# Patient Record
Sex: Male | Born: 1935 | Race: White | Hispanic: No | Marital: Married | Smoking: Never smoker
Health system: Southern US, Community
[De-identification: ages and names within clinical notes are randomized; demographics above are authoritative.]

## PROBLEM LIST (undated history)

## (undated) DIAGNOSIS — N183 Chronic kidney disease, stage 3 unspecified: Secondary | ICD-10-CM

## (undated) DIAGNOSIS — I480 Paroxysmal atrial fibrillation: Secondary | ICD-10-CM

## (undated) DIAGNOSIS — I255 Ischemic cardiomyopathy: Secondary | ICD-10-CM

## (undated) DIAGNOSIS — I219 Acute myocardial infarction, unspecified: Secondary | ICD-10-CM

## (undated) DIAGNOSIS — J61 Pneumoconiosis due to asbestos and other mineral fibers: Secondary | ICD-10-CM

## (undated) DIAGNOSIS — S72009A Fracture of unspecified part of neck of unspecified femur, initial encounter for closed fracture: Secondary | ICD-10-CM

## (undated) DIAGNOSIS — I2510 Atherosclerotic heart disease of native coronary artery without angina pectoris: Secondary | ICD-10-CM

## (undated) DIAGNOSIS — D649 Anemia, unspecified: Secondary | ICD-10-CM

## (undated) DIAGNOSIS — M1990 Unspecified osteoarthritis, unspecified site: Secondary | ICD-10-CM

## (undated) DIAGNOSIS — M47816 Spondylosis without myelopathy or radiculopathy, lumbar region: Secondary | ICD-10-CM

## (undated) DIAGNOSIS — R0600 Dyspnea, unspecified: Secondary | ICD-10-CM

## (undated) DIAGNOSIS — I951 Orthostatic hypotension: Secondary | ICD-10-CM

## (undated) DIAGNOSIS — E871 Hypo-osmolality and hyponatremia: Secondary | ICD-10-CM

## (undated) DIAGNOSIS — I351 Nonrheumatic aortic (valve) insufficiency: Secondary | ICD-10-CM

## (undated) DIAGNOSIS — I440 Atrioventricular block, first degree: Secondary | ICD-10-CM

## (undated) DIAGNOSIS — I313 Pericardial effusion (noninflammatory): Secondary | ICD-10-CM

## (undated) DIAGNOSIS — N320 Bladder-neck obstruction: Secondary | ICD-10-CM

## (undated) DIAGNOSIS — I3139 Other pericardial effusion (noninflammatory): Secondary | ICD-10-CM

## (undated) DIAGNOSIS — I5042 Chronic combined systolic (congestive) and diastolic (congestive) heart failure: Secondary | ICD-10-CM

## (undated) DIAGNOSIS — E785 Hyperlipidemia, unspecified: Secondary | ICD-10-CM

## (undated) HISTORY — PX: OTHER SURGICAL HISTORY: SHX169

---

## 2000-08-18 ENCOUNTER — Ambulatory Visit (HOSPITAL_COMMUNITY): Admission: RE | Admit: 2000-08-18 | Discharge: 2000-08-18 | Payer: Self-pay | Admitting: Gastroenterology

## 2000-08-18 ENCOUNTER — Encounter (INDEPENDENT_AMBULATORY_CARE_PROVIDER_SITE_OTHER): Payer: Self-pay | Admitting: Specialist

## 2005-09-15 ENCOUNTER — Encounter: Admission: RE | Admit: 2005-09-15 | Discharge: 2005-09-15 | Payer: Self-pay | Admitting: Orthopedic Surgery

## 2005-11-16 ENCOUNTER — Encounter: Payer: Self-pay | Admitting: Vascular Surgery

## 2005-11-16 ENCOUNTER — Ambulatory Visit: Admission: RE | Admit: 2005-11-16 | Discharge: 2005-11-16 | Payer: Self-pay | Admitting: Orthopedic Surgery

## 2009-04-06 HISTORY — PX: ROTATOR CUFF REPAIR: SHX139

## 2009-04-26 ENCOUNTER — Ambulatory Visit (HOSPITAL_BASED_OUTPATIENT_CLINIC_OR_DEPARTMENT_OTHER): Admission: RE | Admit: 2009-04-26 | Discharge: 2009-04-27 | Payer: Self-pay | Admitting: Orthopedic Surgery

## 2010-04-26 ENCOUNTER — Encounter: Payer: Self-pay | Admitting: Internal Medicine

## 2010-08-22 NOTE — Procedures (Signed)
[name]  Patient:    Danny Oneill, Danny Oneill                       MRN: [id number] Proc. Date: 08/18/00 Adm. Date:  [id number] Attending:  Louie Bun CC:         Richard A. Jacky Kindle, M.D.   Procedure Report  PROCEDURE:  Colonoscopy with polypectomy.  INDICATION FOR PROCEDURE:  Screening colonoscopy in a 77 year old patient who has also had a change in stool caliber and some nonspecific perianal complaints.  DESCRIPTION OF PROCEDURE:  The patient was placed in the left lateral decubitus position and placed on the pulse monitor with continuous low-flow oxygen delivered by nasal cannula.  He was sedated with 60 mg IV Demerol and 6 mg IV Versed.  The Olympus video colonoscope was inserted into the rectum and advanced to the cecum, confirmed by transillumination at McBurneys point and visualization of the ileocecal valve and appendiceal orifice.  The prep was excellent.  The cecum appeared normal.  In the proximal ascending colon was seen an 8 mm sessile polyp which was fulgurated by hot biopsy.  The remainder of the ascending, transverse, descending, and sigmoid colon all appeared normal with further masses, polyps, diverticula, or other mucosal abnormalities.  Within the rectum was seen an 8 mm sessile polyp which was also fulgurated by hot biopsy.  The colonoscope was then withdrawn, and the patient returned to the recovery room in stable condition.  He tolerated the procedure well, and there were no immediate complications.  IMPRESSION:  Ascending and rectal colon polyps.  PLAN:  Await biopsy results to determine method and interval for future colon screening. DD:  08/18/00 TD:  08/18/00 Job: [id number]

## 2013-01-04 ENCOUNTER — Ambulatory Visit (HOSPITAL_COMMUNITY)
Admission: RE | Admit: 2013-01-04 | Discharge: 2013-01-04 | Disposition: A | Discharge status: Home / Self Care | Payer: Medicare Other | Source: Ambulatory Visit | Attending: Vascular Surgery | Admitting: Vascular Surgery

## 2013-01-04 ENCOUNTER — Other Ambulatory Visit (HOSPITAL_COMMUNITY): Payer: Self-pay | Admitting: Internal Medicine

## 2013-01-04 DIAGNOSIS — R4789 Other speech disturbances: Secondary | ICD-10-CM

## 2013-01-30 ENCOUNTER — Other Ambulatory Visit (HOSPITAL_COMMUNITY): Payer: Self-pay | Admitting: Internal Medicine

## 2013-01-30 DIAGNOSIS — R109 Unspecified abdominal pain: Secondary | ICD-10-CM

## 2013-02-01 ENCOUNTER — Ambulatory Visit (HOSPITAL_COMMUNITY)
Admission: RE | Admit: 2013-02-01 | Discharge: 2013-02-01 | Disposition: A | Discharge status: Home / Self Care | Payer: Medicare Other | Source: Ambulatory Visit | Attending: Internal Medicine | Admitting: Internal Medicine

## 2013-02-01 ENCOUNTER — Encounter (HOSPITAL_COMMUNITY): Payer: Self-pay

## 2013-02-01 DIAGNOSIS — Z77090 Contact with and (suspected) exposure to asbestos: Secondary | ICD-10-CM | POA: Insufficient documentation

## 2013-02-01 DIAGNOSIS — I2510 Atherosclerotic heart disease of native coronary artery without angina pectoris: Secondary | ICD-10-CM | POA: Insufficient documentation

## 2013-02-01 DIAGNOSIS — K429 Umbilical hernia without obstruction or gangrene: Secondary | ICD-10-CM | POA: Insufficient documentation

## 2013-02-01 DIAGNOSIS — R091 Pleurisy: Secondary | ICD-10-CM | POA: Insufficient documentation

## 2013-02-01 DIAGNOSIS — R109 Unspecified abdominal pain: Secondary | ICD-10-CM | POA: Insufficient documentation

## 2013-02-01 DIAGNOSIS — Q438 Other specified congenital malformations of intestine: Secondary | ICD-10-CM | POA: Insufficient documentation

## 2013-02-01 DIAGNOSIS — I7090 Unspecified atherosclerosis: Secondary | ICD-10-CM | POA: Insufficient documentation

## 2013-02-01 MED ORDER — IOHEXOL 300 MG/ML  SOLN
100.0000 mL | Freq: Once | INTRAMUSCULAR | Status: AC | PRN
  Administered 2013-02-01: 100 mL via INTRAVENOUS

## 2013-02-02 ENCOUNTER — Other Ambulatory Visit (HOSPITAL_COMMUNITY): Payer: Self-pay | Admitting: Internal Medicine

## 2013-02-02 DIAGNOSIS — R109 Unspecified abdominal pain: Secondary | ICD-10-CM

## 2013-02-02 DIAGNOSIS — M549 Dorsalgia, unspecified: Secondary | ICD-10-CM

## 2013-02-10 ENCOUNTER — Ambulatory Visit (HOSPITAL_COMMUNITY): Payer: Medicare Other

## 2013-03-13 ENCOUNTER — Ambulatory Visit (HOSPITAL_COMMUNITY)
Admission: RE | Admit: 2013-03-13 | Discharge: 2013-03-13 | Disposition: A | Discharge status: Home / Self Care | Payer: Medicare Other | Source: Ambulatory Visit | Attending: Internal Medicine | Admitting: Internal Medicine

## 2013-03-13 ENCOUNTER — Ambulatory Visit (HOSPITAL_COMMUNITY): Admission: RE | Admit: 2013-03-13 | Payer: Medicare Other | Source: Ambulatory Visit

## 2013-03-13 DIAGNOSIS — R109 Unspecified abdominal pain: Secondary | ICD-10-CM | POA: Insufficient documentation

## 2013-03-13 DIAGNOSIS — M549 Dorsalgia, unspecified: Secondary | ICD-10-CM

## 2013-03-13 DIAGNOSIS — M47817 Spondylosis without myelopathy or radiculopathy, lumbosacral region: Secondary | ICD-10-CM | POA: Insufficient documentation

## 2013-03-13 DIAGNOSIS — R609 Edema, unspecified: Secondary | ICD-10-CM | POA: Insufficient documentation

## 2013-03-13 DIAGNOSIS — R091 Pleurisy: Secondary | ICD-10-CM | POA: Insufficient documentation

## 2013-09-18 ENCOUNTER — Encounter (HOSPITAL_COMMUNITY): Payer: Self-pay | Admitting: Pharmacy Technician

## 2013-09-20 ENCOUNTER — Encounter (HOSPITAL_COMMUNITY)
Admission: RE | Admit: 2013-09-20 | Discharge: 2013-09-20 | Disposition: A | Discharge status: Home / Self Care | Payer: Medicare FFS | Source: Ambulatory Visit | Attending: Orthopedic Surgery | Admitting: Orthopedic Surgery

## 2013-09-20 ENCOUNTER — Encounter (HOSPITAL_COMMUNITY): Payer: Self-pay

## 2013-09-20 DIAGNOSIS — Z01818 Encounter for other preprocedural examination: Secondary | ICD-10-CM | POA: Insufficient documentation

## 2013-09-20 DIAGNOSIS — Z01812 Encounter for preprocedural laboratory examination: Secondary | ICD-10-CM | POA: Insufficient documentation

## 2013-09-20 HISTORY — DX: Unspecified osteoarthritis, unspecified site: M19.90

## 2013-09-20 HISTORY — DX: Spondylosis without myelopathy or radiculopathy, lumbar region: M47.816

## 2013-09-20 HISTORY — DX: Hyperlipidemia, unspecified: E78.5

## 2013-09-20 HISTORY — DX: Pneumoconiosis due to asbestos and other mineral fibers: J61

## 2013-09-20 LAB — URINALYSIS, ROUTINE W REFLEX MICROSCOPIC
Bilirubin Urine: NEGATIVE
Glucose, UA: NEGATIVE mg/dL
Hgb urine dipstick: NEGATIVE
KETONES UR: NEGATIVE mg/dL
LEUKOCYTES UA: NEGATIVE
NITRITE: NEGATIVE
PROTEIN: NEGATIVE mg/dL
Specific Gravity, Urine: 1.022 (ref 1.005–1.030)
UROBILINOGEN UA: 1 mg/dL (ref 0.0–1.0)
pH: 6 (ref 5.0–8.0)

## 2013-09-20 LAB — BASIC METABOLIC PANEL
BUN: 18 mg/dL (ref 6–23)
CALCIUM: 9.6 mg/dL (ref 8.4–10.5)
CO2: 29 meq/L (ref 19–32)
Chloride: 102 mEq/L (ref 96–112)
Creatinine, Ser: 1.09 mg/dL (ref 0.50–1.35)
GFR calc Af Amer: 72 mL/min — ABNORMAL LOW (ref >90)
GFR calc non Af Amer: 62 mL/min — ABNORMAL LOW (ref >90)
Glucose, Bld: 81 mg/dL (ref 70–99)
Potassium: 4.9 mEq/L (ref 3.7–5.3)
Sodium: 141 mEq/L (ref 137–147)

## 2013-09-20 LAB — SURGICAL PCR SCREEN
MRSA, PCR: NEGATIVE
Staphylococcus aureus: NEGATIVE

## 2013-09-20 LAB — CBC
HEMATOCRIT: 45.2 % (ref 39.0–52.0)
Hemoglobin: 14.9 g/dL (ref 13.0–17.0)
MCH: 31.5 pg (ref 26.0–34.0)
MCHC: 33 g/dL (ref 30.0–36.0)
MCV: 95.6 fL (ref 78.0–100.0)
PLATELETS: 199 10*3/uL (ref 150–400)
RBC: 4.73 MIL/uL (ref 4.22–5.81)
RDW: 13.4 % (ref 11.5–15.5)
WBC: 6.2 10*3/uL (ref 4.0–10.5)

## 2013-09-20 LAB — PROTIME-INR
INR: 1.05 (ref 0.00–1.49)
Prothrombin Time: 13.5 seconds (ref 11.6–15.2)

## 2013-09-20 LAB — APTT: APTT: 29 s (ref 24–37)

## 2013-09-20 NOTE — Patient Instructions (Addendum)
Danny Oneill  09/20/2013                           YOUR PROCEDURE IS SCHEDULED ON:  10/03/13 AT 2:20 PM               ENTER THRU [facility] MAIN [facility]OSPITAL ENTRANCE AND                            FOLLOW  SIGNS TO SHORT STAY CENTER                 ARRIVE AT SHORT STAY AT: 11:20 AM               CALL THIS NUMBER IF ANY PROBLEMS THE DAY OF SURGERY :               [phone]                                REMEMBER:   Do not eat food  AFTER MIDNIGHT   May have clear liquids UNTIL 6 HOURS BEFORE SURGERY (8:00 AM)               Take these medicines the morning of surgery with               A SIPS OF WATER :    NONE     Do not wear jewelry, make-up   Do not wear lotions, powders, or perfumes.   Do not shave legs or underarms 12 hrs. before surgery (men may shave face)  Do not bring valuables to the hospital.  Contacts, dentures or bridgework may not be worn into surgery.  Leave suitcase in the car. After surgery it may be brought to your room.  For patients admitted to the hospital more than one night, checkout time is            11:00 AM                                                      __________________________________________________________________                                        CLEAR LIQUID DIET   Foods Allowed                                                                     Foods Excluded  Coffee and tea, regular and decaf                             liquids that you cannot  Plain Jell-O in any flavor  see through such as: Fruit ices (not with fruit pulp)                                     milk, soups, orange juice  Iced Popsicles                                    All solid food Carbonated beverages, regular and diet                                    Cranberry, grape and apple juices Sports drinks like Gatorade Lightly seasoned clear broth or consume(fat free) Sugar, honey  syrup  _____________________________________________________________________                Danny Oneill - PREPARING FOR SURGERY  Before surgery, you can play an important role.  Because skin is not sterile, your skin needs to be as free of germs as possible.  You can reduce the number of germs on your skin by washing with CHG (chlorahexidine gluconate) soap before surgery.  CHG is an antiseptic cleaner which kills germs and bonds with the skin to continue killing germs even after washing. Please DO NOT use if you have an allergy to CHG or antibacterial soaps.  If your skin becomes reddened/irritated stop using the CHG and inform your nurse when you arrive at Short Stay. Do not shave (including legs and underarms) for at least 48 hours prior to the first CHG shower.  You may shave your face. Please follow these instructions carefully:   1.  Shower with CHG Soap the night before surgery and the  morning of Surgery.   2.  If you choose to wash your hair, wash your hair first as usual with your  normal  Shampoo.   3.  After you shampoo, rinse your hair and body thoroughly to remove the  shampoo.                                         4.  Use CHG as you would any other liquid soap.  You can apply chg directly  to the skin and wash . Gently wash with scrungie or clean wascloth    5.  Apply the CHG Soap to your body ONLY FROM THE NECK DOWN.   Do not use on open                           Wound or open sores. Avoid contact with eyes, ears mouth and genitals (private parts).                        Genitals (private parts) with your normal soap.              6.  Wash thoroughly, paying special attention to the area where your surgery  will be performed.   7.  Thoroughly rinse your body with warm water from the neck down.   8.  DO NOT shower/wash with your normal soap after using and rinsing off  the CHG Soap .  9.  Pat yourself dry with a clean towel.             10.  Wear clean  pajamas.             11.  Place clean sheets on your bed the night of your first shower and do not  sleep with pets.  Day of Surgery : Do not apply any lotions/deodorants the morning of surgery.  Please wear clean clothes to the hospital/surgery center.  FAILURE TO FOLLOW THESE INSTRUCTIONS MAY RESULT IN THE CANCELLATION OF YOUR SURGERY    PATIENT SIGNATURE_________________________________     Incentive Spirometer  An incentive spirometer is a tool that can help keep your lungs clear and active. This tool measures how well you are filling your lungs with each breath. Taking long deep breaths may help reverse or decrease the chance of developing breathing (pulmonary) problems (especially infection) following:  A long period of time when you are unable to move or be active. BEFORE THE PROCEDURE   If the spirometer includes an indicator to show your best effort, your nurse or respiratory therapist will set it to a desired goal.  If possible, sit up straight or lean slightly forward. Try not to slouch.  Hold the incentive spirometer in an upright position. INSTRUCTIONS FOR USE  1. Sit on the edge of your bed if possible, or sit up as far as you can in bed or on a chair. 2. Hold the incentive spirometer in an upright position. 3. Breathe out normally. 4. Place the mouthpiece in your mouth and seal your lips tightly around it. 5. Breathe in slowly and as deeply as possible, raising the piston or the ball toward the top of the column. 6. Hold your breath for 3-5 seconds or for as long as possible. Allow the piston or ball to fall to the bottom of the column. 7. Remove the mouthpiece from your mouth and breathe out normally. 8. Rest for a few seconds and repeat Steps 1 through 7 at least 10 times every 1-2 hours when you are awake. Take your time and take a few normal breaths between deep breaths. 9. The spirometer may include an indicator to show your best effort. Use the indicator as a  goal to work toward during each repetition. 10. After each set of 10 deep breaths, practice coughing to be sure your lungs are clear. If you have an incision (the cut made at the time of surgery), support your incision when coughing by placing a pillow or rolled up towels firmly against it. Once you are able to get out of bed, walk around indoors and cough well. You may stop using the incentive spirometer when instructed by your caregiver.  RISKS AND COMPLICATIONS  Take your time so you do not get dizzy or light-headed.  If you are in pain, you may need to take or ask for pain medication before doing incentive spirometry. It is harder to take a deep breath if you are having pain. AFTER USE  Rest and breathe slowly and easily.  It can be helpful to keep track of a log of your progress. Your caregiver can provide you with a simple table to help with this. If you are using the spirometer at home, follow these instructions: [place] IF:   You are having difficultly using the spirometer.  You have trouble using the spirometer as often as instructed.  Your pain medication is not giving enough relief while using the  spirometer.  You develop fever of 100.5 F (38.1 C) or higher. SEEK IMMEDIATE MEDICAL CARE IF:   You cough up bloody sputum that had not been present before.  You develop fever of 102 F (38.9 C) or greater.  You develop worsening pain at or near the incision site. MAKE SURE YOU:   Understand these instructions.  Will watch your condition.  Will get help right away if you are not doing well or get worse. Document Released: 08/03/2006 Document Revised: 06/15/2011 Document Reviewed: 10/04/2006 [employer] Patient Information 2014 [employer], [place].   ________________________________________________________________________  WHAT IS A BLOOD TRANSFUSION? Blood Transfusion Information  A transfusion is the replacement of blood or some of its parts. Blood is made up  of multiple cells which provide different functions.  Red blood cells carry oxygen and are used for blood loss replacement.  White blood cells fight against infection.  Platelets control bleeding.  Plasma helps clot blood.  Other blood products are available for specialized needs, such as hemophilia or other clotting disorders. BEFORE THE TRANSFUSION  Who gives blood for transfusions?   Healthy volunteers who are fully evaluated to make sure their blood is safe. This is blood bank blood. Transfusion therapy is the safest it has ever been in the practice of medicine. Before blood is taken from a donor, a complete history is taken to make sure that person has no history of diseases nor engages in risky social behavior (examples are intravenous drug use or sexual activity with multiple partners). The donor's travel history is screened to minimize risk of transmitting infections, such as malaria. The donated blood is tested for signs of infectious diseases, such as HIV and hepatitis. The blood is then tested to be sure it is compatible with you in order to minimize the chance of a transfusion reaction. If you or a relative donates blood, this is often done in anticipation of surgery and is not appropriate for emergency situations. It takes many days to process the donated blood. RISKS AND COMPLICATIONS Although transfusion therapy is very safe and saves many lives, the main dangers of transfusion include:   Getting an infectious disease.  Developing a transfusion reaction. This is an allergic reaction to something in the blood you were given. Every precaution is taken to prevent this. The decision to have a blood transfusion has been considered carefully by your caregiver before blood is given. Blood is not given unless the benefits outweigh the risks. AFTER THE TRANSFUSION  Right after receiving a blood transfusion, you will usually feel much better and more energetic. This is especially true  if your red blood cells have gotten low (anemic). The transfusion raises the level of the red blood cells which carry oxygen, and this usually causes an energy increase.  The nurse administering the transfusion will monitor you carefully for complications. HOME CARE INSTRUCTIONS  No special instructions are needed after a transfusion. You may find your energy is better. Speak with your caregiver about any limitations on activity for underlying diseases you may have. SEEK MEDICAL CARE IF:   Your condition is not improving after your transfusion.  You develop redness or irritation at the intravenous (IV) site. SEEK IMMEDIATE MEDICAL CARE IF:  Any of the following symptoms occur over the next 12 hours:  Shaking chills.  You have a temperature by mouth above 102 F (38.9 C), not controlled by medicine.  Chest, back, or muscle pain.  People around you feel you are not acting correctly or are confused.  Shortness of breath or difficulty breathing.  Dizziness and fainting.  You get a rash or develop hives.  You have a decrease in urine output.  Your urine turns a dark color or changes to pink, red, or brown. Any of the following symptoms occur over the next 10 days:  You have a temperature by mouth above 102 F (38.9 C), not controlled by medicine.  Shortness of breath.  Weakness after normal activity.  The white part of the eye turns yellow (jaundice).  You have a decrease in the amount of urine or are urinating less often.  Your urine turns a dark color or changes to pink, red, or brown. Document Released: 03/20/2000 Document Revised: 06/15/2011 Document Reviewed: 11/07/2007 [facility] Patient Information 2014 [place], [place].  _______________________________________________________________________

## 2013-09-21 NOTE — H&P (Signed)
TOTAL KNEE ADMISSION H&P  Patient is being admitted for right total knee arthroplasty.  Subjective:  Chief Complaint:    Right knee OA / pain.  HPI: Willet Oneill, 80 y.o. male, has a history of pain and functional disability in the right knee due to arthritis and has failed non-surgical conservative treatments for greater than 12 weeks to includeNSAID's and/or analgesics and activity modification.  Onset of symptoms was gradual, starting 1+ years ago with gradually worsening course since that time. The patient noted no past surgery on the right knee(s).  Patient currently rates pain in the right knee(s) at 6 out of 10 with activity. Patient has worsening of pain with activity and weight bearing, pain that interferes with activities of daily living, pain with passive range of motion, crepitus and joint swelling.  Patient has evidence of periarticular osteophytes and joint space narrowing by imaging studies. There is no active infection.  Risks, benefits and expectations were discussed with the patient.  Risks including but not limited to the risk of anesthesia, blood clots, nerve damage, blood vessel damage, failure of the prosthesis, infection and up to and including death.  Patient understand the risks, benefits and expectations and wishes to proceed with surgery.   PCP: [name], MD  D/C Plans:      Home with HHPT/SNF  Post-op Meds:       No Rx given  Tranexamic Acid:      Is to be given  Decadron:      Is to be given  FYI:     ASA post-op  Norco post-op    Past Medical History  Diagnosis Date  . Arthritis   . Hyperlipidemia   . Lumbar spondylosis   . Asbestosis     "no brething problems" pt states worked in [employer] for many yrs and was exposed to asbestosis    Past Surgical History  Procedure Laterality Date  . Rotator cuff repair  2011    L SHOULDER  . Cataracts removed      BIL    No prescriptions prior to admission   No Known Allergies   History   Substance Use Topics  . Smoking status: Never Smoker   . Smokeless tobacco: Not on file  . Alcohol Use: No     Review of Systems  Constitutional: Negative.   HENT: Negative.   Eyes: Negative.   Respiratory: Negative.   Cardiovascular: Negative.   Gastrointestinal: Negative.   Genitourinary: Negative.   Musculoskeletal: Positive for joint pain.  Skin: Negative.   Neurological: Negative.   Endo/Heme/Allergies: Negative.   Psychiatric/Behavioral: Negative.     Objective:  Physical Exam  Constitutional: He is oriented to person, place, and time. He appears well-developed and well-nourished.  HENT:  Head: Normocephalic and atraumatic.  Eyes: Pupils are equal, round, and reactive to light.  Neck: Neck supple. No JVD present. No tracheal deviation present. No thyromegaly present.  Cardiovascular: Normal rate, regular rhythm, normal heart sounds and intact distal pulses.   Respiratory: Effort normal and breath sounds normal. No respiratory distress. He has no wheezes.  GI: Soft. There is no tenderness. There is no guarding.  Musculoskeletal:       Right knee: He exhibits decreased range of motion, swelling and bony tenderness. He exhibits no ecchymosis, no deformity, no laceration and no erythema. Tenderness found.  Lymphadenopathy:    He has no cervical adenopathy.  Neurological: He is alert and oriented to person, place, and time.  Skin: Skin is warm  and dry.  Psychiatric: He has a normal mood and affect.     Imaging Review Plain radiographs demonstrate severe degenerative joint disease of the right knee(s). The overall alignment is neutral. The bone quality appears to be good for age and reported activity level.  Assessment/Plan:  End stage arthritis, right knee   The patient history, physical examination, clinical judgment of the provider and imaging studies are consistent with end stage degenerative joint disease of the right knee(s) and total knee arthroplasty is  deemed medically necessary. The treatment options including medical management, injection therapy arthroscopy and arthroplasty were discussed at length. The risks and benefits of total knee arthroplasty were presented and reviewed. The risks due to aseptic loosening, infection, stiffness, patella tracking problems, thromboembolic complications and other imponderables were discussed. The patient acknowledged the explanation, agreed to proceed with the plan and consent was signed. Patient is being admitted for inpatient treatment for surgery, pain control, PT, OT, prophylactic antibiotics, VTE prophylaxis, progressive ambulation and ADL's and discharge planning. The patient is planning to be discharged to skilled nursing facility / home.     Anastasio AuerbachMatthew S. Babish   PA-C  09/21/2013, 5:30 PM

## 2013-10-03 ENCOUNTER — Encounter (HOSPITAL_COMMUNITY): Payer: Self-pay | Admitting: *Deleted

## 2013-10-03 ENCOUNTER — Encounter (HOSPITAL_COMMUNITY)
Admission: RE | Disposition: A | Discharge status: Home Health | Payer: Self-pay | Source: Ambulatory Visit | Attending: Orthopedic Surgery

## 2013-10-03 ENCOUNTER — Inpatient Hospital Stay (HOSPITAL_COMMUNITY)
Admission: RE | Admit: 2013-10-03 | Discharge: 2013-10-05 | DRG: 470 | Disposition: A | Discharge status: Home Health | Payer: Medicare FFS | Source: Ambulatory Visit | Attending: Orthopedic Surgery | Admitting: Orthopedic Surgery

## 2013-10-03 ENCOUNTER — Inpatient Hospital Stay (HOSPITAL_COMMUNITY): Payer: Medicare FFS | Admitting: Anesthesiology

## 2013-10-03 ENCOUNTER — Encounter (HOSPITAL_COMMUNITY): Payer: Medicare FFS | Admitting: Anesthesiology

## 2013-10-03 DIAGNOSIS — D62 Acute posthemorrhagic anemia: Secondary | ICD-10-CM | POA: Diagnosis not present

## 2013-10-03 DIAGNOSIS — M6580 Other synovitis and tenosynovitis, unspecified site: Secondary | ICD-10-CM | POA: Diagnosis present

## 2013-10-03 DIAGNOSIS — M1710 Unilateral primary osteoarthritis, unspecified knee: Principal | ICD-10-CM | POA: Diagnosis present

## 2013-10-03 DIAGNOSIS — Z96651 Presence of right artificial knee joint: Secondary | ICD-10-CM

## 2013-10-03 DIAGNOSIS — Z96659 Presence of unspecified artificial knee joint: Secondary | ICD-10-CM

## 2013-10-03 DIAGNOSIS — Z6829 Body mass index (BMI) 29.0-29.9, adult: Secondary | ICD-10-CM

## 2013-10-03 DIAGNOSIS — D500 Iron deficiency anemia secondary to blood loss (chronic): Secondary | ICD-10-CM | POA: Diagnosis not present

## 2013-10-03 DIAGNOSIS — Z01812 Encounter for preprocedural laboratory examination: Secondary | ICD-10-CM

## 2013-10-03 DIAGNOSIS — E785 Hyperlipidemia, unspecified: Secondary | ICD-10-CM | POA: Diagnosis present

## 2013-10-03 DIAGNOSIS — E663 Overweight: Secondary | ICD-10-CM | POA: Diagnosis present

## 2013-10-03 HISTORY — PX: TOTAL KNEE ARTHROPLASTY: SHX125

## 2013-10-03 LAB — TYPE AND SCREEN
ABO/RH(D): O NEG
Antibody Screen: NEGATIVE

## 2013-10-03 LAB — ABO/RH: ABO/RH(D): O NEG

## 2013-10-03 SURGERY — ARTHROPLASTY, KNEE, TOTAL
Anesthesia: Spinal | Site: Knee | Laterality: Right

## 2013-10-03 MED ORDER — CEFAZOLIN SODIUM-DEXTROSE 2-3 GM-% IV SOLR
2.0000 g | INTRAVENOUS | Status: AC
  Administered 2013-10-03: 2 g via INTRAVENOUS

## 2013-10-03 MED ORDER — ONDANSETRON HCL 4 MG/2ML IJ SOLN: 4.0000 mg | Freq: Four times a day (QID) | INTRAMUSCULAR | Status: DC | PRN

## 2013-10-03 MED ORDER — KETOROLAC TROMETHAMINE 30 MG/ML IJ SOLN
INTRAMUSCULAR | Status: DC | PRN
Start: 2013-10-03 — End: 2013-10-03
  Administered 2013-10-03: 30 mg via INTRAVENOUS

## 2013-10-03 MED ORDER — PROPOFOL 10 MG/ML IV BOLUS
INTRAVENOUS | Status: DC | PRN
  Administered 2013-10-03: 20 mg via INTRAVENOUS

## 2013-10-03 MED ORDER — OXYCODONE HCL 5 MG/5ML PO SOLN
5.0000 mg | Freq: Once | ORAL | Status: DC | PRN
  Filled 2013-10-03: qty 5

## 2013-10-03 MED ORDER — FERROUS SULFATE 325 (65 FE) MG PO TABS
325.0000 mg | ORAL_TABLET | Freq: Three times a day (TID) | ORAL | Status: DC
  Administered 2013-10-04 – 2013-10-05 (×5): 325 mg via ORAL
  Filled 2013-10-03 (×7): qty 1

## 2013-10-03 MED ORDER — SODIUM CHLORIDE 0.9 % IR SOLN
Status: DC | PRN
  Administered 2013-10-03: 1000 mL

## 2013-10-03 MED ORDER — BUPIVACAINE LIPOSOME 1.3 % IJ SUSP
20.0000 mL | Freq: Once | INTRAMUSCULAR | Status: AC
  Administered 2013-10-03: 20 mL
  Filled 2013-10-03: qty 20

## 2013-10-03 MED ORDER — CEFAZOLIN SODIUM-DEXTROSE 2-3 GM-% IV SOLR
INTRAVENOUS | Status: AC
  Filled 2013-10-03: qty 50

## 2013-10-03 MED ORDER — BUPIVACAINE-EPINEPHRINE (PF) 0.25% -1:200000 IJ SOLN
INTRAMUSCULAR | Status: DC | PRN
  Administered 2013-10-03: 30 mL

## 2013-10-03 MED ORDER — CELECOXIB 200 MG PO CAPS
200.0000 mg | ORAL_CAPSULE | Freq: Two times a day (BID) | ORAL | Status: DC
  Administered 2013-10-04 – 2013-10-05 (×3): 200 mg via ORAL
  Filled 2013-10-03 (×4): qty 1

## 2013-10-03 MED ORDER — METHOCARBAMOL 500 MG PO TABS
500.0000 mg | ORAL_TABLET | Freq: Four times a day (QID) | ORAL | Status: DC | PRN
  Administered 2013-10-04: 500 mg via ORAL
  Filled 2013-10-03: qty 1

## 2013-10-03 MED ORDER — PROPOFOL INFUSION 10 MG/ML OPTIME
INTRAVENOUS | Status: DC | PRN
  Administered 2013-10-03: 100 ug/kg/min via INTRAVENOUS

## 2013-10-03 MED ORDER — MENTHOL 3 MG MT LOZG
1.0000 | LOZENGE | OROMUCOSAL | Status: DC | PRN
  Filled 2013-10-03: qty 9

## 2013-10-03 MED ORDER — FENTANYL CITRATE 0.05 MG/ML IJ SOLN
INTRAMUSCULAR | Status: AC
  Filled 2013-10-03: qty 2

## 2013-10-03 MED ORDER — HYDROMORPHONE HCL PF 1 MG/ML IJ SOLN: 0.5000 mg | INTRAMUSCULAR | Status: DC | PRN

## 2013-10-03 MED ORDER — PROPOFOL 10 MG/ML IV BOLUS
INTRAVENOUS | Status: AC
  Filled 2013-10-03: qty 20

## 2013-10-03 MED ORDER — 0.9 % SODIUM CHLORIDE (POUR BTL) OPTIME
TOPICAL | Status: DC | PRN
  Administered 2013-10-03: 1000 mL

## 2013-10-03 MED ORDER — METOCLOPRAMIDE HCL 5 MG/ML IJ SOLN
5.0000 mg | Freq: Three times a day (TID) | INTRAMUSCULAR | Status: DC | PRN
Start: 2013-10-03 — End: 2013-10-05

## 2013-10-03 MED ORDER — MIDAZOLAM HCL 2 MG/2ML IJ SOLN
INTRAMUSCULAR | Status: AC
  Filled 2013-10-03: qty 2

## 2013-10-03 MED ORDER — DEXAMETHASONE SODIUM PHOSPHATE 10 MG/ML IJ SOLN
INTRAMUSCULAR | Status: AC
  Filled 2013-10-03: qty 1

## 2013-10-03 MED ORDER — KETOROLAC TROMETHAMINE 30 MG/ML IJ SOLN
INTRAMUSCULAR | Status: AC
  Filled 2013-10-03: qty 1

## 2013-10-03 MED ORDER — CHLORHEXIDINE GLUCONATE 4 % EX LIQD
60.0000 mL | Freq: Once | CUTANEOUS | Status: DC
Start: 2013-10-03 — End: 2013-10-03

## 2013-10-03 MED ORDER — SODIUM CHLORIDE 0.9 % IV SOLN
INTRAVENOUS | Status: DC
  Administered 2013-10-03: via INTRAVENOUS
  Filled 2013-10-03 (×9): qty 1000

## 2013-10-03 MED ORDER — EPHEDRINE SULFATE 50 MG/ML IJ SOLN
INTRAMUSCULAR | Status: DC | PRN
  Administered 2013-10-03 (×2): 5 mg via INTRAVENOUS

## 2013-10-03 MED ORDER — MIDAZOLAM HCL 5 MG/5ML IJ SOLN
INTRAMUSCULAR | Status: DC | PRN
  Administered 2013-10-03 (×2): 1 mg via INTRAVENOUS

## 2013-10-03 MED ORDER — PROMETHAZINE HCL 25 MG/ML IJ SOLN: 6.2500 mg | INTRAMUSCULAR | Status: DC | PRN

## 2013-10-03 MED ORDER — OXYCODONE HCL 5 MG PO TABS: 5.0000 mg | ORAL_TABLET | Freq: Once | ORAL | Status: DC | PRN

## 2013-10-03 MED ORDER — ASPIRIN EC 325 MG PO TBEC
325.0000 mg | DELAYED_RELEASE_TABLET | Freq: Two times a day (BID) | ORAL | Status: DC
  Administered 2013-10-04 – 2013-10-05 (×3): 325 mg via ORAL
  Filled 2013-10-03 (×5): qty 1

## 2013-10-03 MED ORDER — PHENOL 1.4 % MT LIQD: 1.0000 | OROMUCOSAL | Status: DC | PRN

## 2013-10-03 MED ORDER — TRANEXAMIC ACID 100 MG/ML IV SOLN
1000.0000 mg | Freq: Once | INTRAVENOUS | Status: AC
  Administered 2013-10-03: 1000 mg via INTRAVENOUS
  Filled 2013-10-03: qty 10

## 2013-10-03 MED ORDER — HYDROCODONE-ACETAMINOPHEN 7.5-325 MG PO TABS
1.0000 | ORAL_TABLET | ORAL | Status: DC
  Administered 2013-10-03 – 2013-10-05 (×8): 1 via ORAL
  Filled 2013-10-03 (×6): qty 1
  Filled 2013-10-03: qty 2
  Filled 2013-10-03: qty 1

## 2013-10-03 MED ORDER — DEXAMETHASONE SODIUM PHOSPHATE 10 MG/ML IJ SOLN
10.0000 mg | Freq: Once | INTRAMUSCULAR | Status: AC
  Administered 2013-10-04: 10 mg via INTRAVENOUS
  Filled 2013-10-03: qty 1

## 2013-10-03 MED ORDER — DOCUSATE SODIUM 100 MG PO CAPS
100.0000 mg | ORAL_CAPSULE | Freq: Two times a day (BID) | ORAL | Status: DC
  Administered 2013-10-04 – 2013-10-05 (×3): 100 mg via ORAL

## 2013-10-03 MED ORDER — MEPERIDINE HCL 50 MG/ML IJ SOLN: 6.2500 mg | INTRAMUSCULAR | Status: DC | PRN

## 2013-10-03 MED ORDER — DIPHENHYDRAMINE HCL 25 MG PO CAPS: 25.0000 mg | ORAL_CAPSULE | Freq: Four times a day (QID) | ORAL | Status: DC | PRN

## 2013-10-03 MED ORDER — CEFAZOLIN SODIUM-DEXTROSE 2-3 GM-% IV SOLR
2.0000 g | Freq: Four times a day (QID) | INTRAVENOUS | Status: AC
  Administered 2013-10-03 – 2013-10-04 (×2): 2 g via INTRAVENOUS
  Filled 2013-10-03 (×3): qty 50

## 2013-10-03 MED ORDER — METOCLOPRAMIDE HCL 10 MG PO TABS: 5.0000 mg | ORAL_TABLET | Freq: Three times a day (TID) | ORAL | Status: DC | PRN

## 2013-10-03 MED ORDER — METHOCARBAMOL 1000 MG/10ML IJ SOLN
500.0000 mg | Freq: Four times a day (QID) | INTRAVENOUS | Status: DC | PRN
  Filled 2013-10-03: qty 5

## 2013-10-03 MED ORDER — BISACODYL 10 MG RE SUPP: 10.0000 mg | Freq: Every day | RECTAL | Status: DC | PRN

## 2013-10-03 MED ORDER — LACTATED RINGERS IV SOLN: INTRAVENOUS | Status: DC

## 2013-10-03 MED ORDER — POLYETHYLENE GLYCOL 3350 17 G PO PACK
17.0000 g | PACK | Freq: Two times a day (BID) | ORAL | Status: DC
  Administered 2013-10-04 – 2013-10-05 (×3): 17 g via ORAL

## 2013-10-03 MED ORDER — SODIUM CHLORIDE 0.9 % IJ SOLN
INTRAMUSCULAR | Status: DC | PRN
  Administered 2013-10-03: 9 mL via INTRAVENOUS

## 2013-10-03 MED ORDER — DEXAMETHASONE SODIUM PHOSPHATE 10 MG/ML IJ SOLN
10.0000 mg | Freq: Once | INTRAMUSCULAR | Status: AC
  Administered 2013-10-03: 10 mg via INTRAVENOUS

## 2013-10-03 MED ORDER — FENTANYL CITRATE 0.05 MG/ML IJ SOLN
INTRAMUSCULAR | Status: DC | PRN
  Administered 2013-10-03 (×2): 50 ug via INTRAVENOUS

## 2013-10-03 MED ORDER — SODIUM CHLORIDE 0.9 % IJ SOLN
INTRAMUSCULAR | Status: AC
  Filled 2013-10-03: qty 10

## 2013-10-03 MED ORDER — BUPIVACAINE-EPINEPHRINE (PF) 0.25% -1:200000 IJ SOLN
INTRAMUSCULAR | Status: AC
  Filled 2013-10-03: qty 30

## 2013-10-03 MED ORDER — MAGNESIUM CITRATE PO SOLN: 1.0000 | Freq: Once | ORAL | Status: AC | PRN

## 2013-10-03 MED ORDER — ONDANSETRON HCL 4 MG PO TABS: 4.0000 mg | ORAL_TABLET | Freq: Four times a day (QID) | ORAL | Status: DC | PRN

## 2013-10-03 MED ORDER — LACTATED RINGERS IV SOLN
INTRAVENOUS | Status: DC | PRN
  Administered 2013-10-03 (×2): via INTRAVENOUS

## 2013-10-03 MED ORDER — ALUM & MAG HYDROXIDE-SIMETH 200-200-20 MG/5ML PO SUSP: 30.0000 mL | ORAL | Status: DC | PRN

## 2013-10-03 MED ORDER — BUPIVACAINE IN DEXTROSE 0.75-8.25 % IT SOLN
INTRATHECAL | Status: DC | PRN
  Administered 2013-10-03: 2 mL via INTRATHECAL

## 2013-10-03 MED ORDER — HYDROMORPHONE HCL PF 1 MG/ML IJ SOLN: 0.2500 mg | INTRAMUSCULAR | Status: DC | PRN

## 2013-10-03 MED ORDER — ONDANSETRON HCL 4 MG/2ML IJ SOLN
INTRAMUSCULAR | Status: DC | PRN
  Administered 2013-10-03: 4 mg via INTRAVENOUS

## 2013-10-03 SURGICAL SUPPLY — 55 items
ADH SKN CLS APL DERMABOND .7 (GAUZE/BANDAGES/DRESSINGS) ×1
BAG SPEC THK2 15X12 ZIP CLS (MISCELLANEOUS)
BAG ZIPLOCK 12X15 (MISCELLANEOUS) IMPLANT
BANDAGE ELASTIC 6 VELCRO ST LF (GAUZE/BANDAGES/DRESSINGS) ×3 IMPLANT
BANDAGE ESMARK 6X9 LF (GAUZE/BANDAGES/DRESSINGS) ×1 IMPLANT
BLADE SAW SGTL 13.0X1.19X90.0M (BLADE) ×3 IMPLANT
BNDG CMPR 9X6 STRL LF SNTH (GAUZE/BANDAGES/DRESSINGS) ×1
BNDG ESMARK 6X9 LF (GAUZE/BANDAGES/DRESSINGS) ×3
BOWL SMART MIX CTS (DISPOSABLE) ×3 IMPLANT
CAPT RP KNEE ×3 IMPLANT
CEMENT HV SMART SET (Cement) ×6 IMPLANT
CUFF TOURN SGL QUICK 34 (TOURNIQUET CUFF) ×2
CUFF TRNQT CYL 34X4X40X1 (TOURNIQUET CUFF) ×1 IMPLANT
DERMABOND ADVANCED (GAUZE/BANDAGES/DRESSINGS) ×2
DERMABOND ADVANCED .7 DNX12 (GAUZE/BANDAGES/DRESSINGS) ×1 IMPLANT
DRAPE EXTREMITY T 121X128X90 (DRAPE) ×3 IMPLANT
DRAPE POUCH INSTRU U-SHP 10X18 (DRAPES) ×3 IMPLANT
DRAPE U-SHAPE 47X51 STRL (DRAPES) ×3 IMPLANT
DRSG AQUACEL AG ADV 3.5X10 (GAUZE/BANDAGES/DRESSINGS) ×3 IMPLANT
DRSG TEGADERM 4X4.75 (GAUZE/BANDAGES/DRESSINGS) IMPLANT
DURAPREP 26ML APPLICATOR (WOUND CARE) ×6 IMPLANT
ELECT REM PT RETURN 9FT ADLT (ELECTROSURGICAL) ×3
ELECTRODE REM PT RTRN 9FT ADLT (ELECTROSURGICAL) ×1 IMPLANT
FACESHIELD WRAPAROUND (MASK) ×15 IMPLANT
GAUZE SPONGE 2X2 8PLY STRL LF (GAUZE/BANDAGES/DRESSINGS) IMPLANT
GLOVE BIOGEL PI IND STRL 7.5 (GLOVE) ×1 IMPLANT
GLOVE BIOGEL PI IND STRL 8 (GLOVE) ×1 IMPLANT
GLOVE BIOGEL PI INDICATOR 7.5 (GLOVE) ×2
GLOVE BIOGEL PI INDICATOR 8 (GLOVE) ×2
GLOVE ECLIPSE 8.0 STRL XLNG CF (GLOVE) ×3 IMPLANT
GLOVE ORTHO TXT STRL SZ7.5 (GLOVE) ×6 IMPLANT
GOWN SPEC L3 XXLG W/TWL (GOWN DISPOSABLE) ×3 IMPLANT
GOWN STRL REUS W/TWL LRG LVL3 (GOWN DISPOSABLE) ×3 IMPLANT
HANDPIECE INTERPULSE COAX TIP (DISPOSABLE) ×3
KIT BASIN OR (CUSTOM PROCEDURE TRAY) ×3 IMPLANT
MANIFOLD NEPTUNE II (INSTRUMENTS) ×3 IMPLANT
NDL SAFETY ECLIPSE 18X1.5 (NEEDLE) ×1 IMPLANT
NEEDLE HYPO 18GX1.5 SHARP (NEEDLE) ×3
PACK TOTAL JOINT (CUSTOM PROCEDURE TRAY) ×3 IMPLANT
POSITIONER SURGICAL ARM (MISCELLANEOUS) ×3 IMPLANT
SET HNDPC FAN SPRY TIP SCT (DISPOSABLE) ×1 IMPLANT
SET PAD KNEE POSITIONER (MISCELLANEOUS) ×3 IMPLANT
SPONGE GAUZE 2X2 STER 10/PKG (GAUZE/BANDAGES/DRESSINGS)
SUCTION FRAZIER 12FR DISP (SUCTIONS) ×3 IMPLANT
SUT MNCRL AB 4-0 PS2 18 (SUTURE) ×3 IMPLANT
SUT VIC AB 1 CT1 36 (SUTURE) ×3 IMPLANT
SUT VIC AB 2-0 CT1 27 (SUTURE) ×9
SUT VIC AB 2-0 CT1 TAPERPNT 27 (SUTURE) ×3 IMPLANT
SUT VLOC 180 0 24IN GS25 (SUTURE) ×3 IMPLANT
SYRINGE 60CC LL (MISCELLANEOUS) ×3 IMPLANT
TOWEL OR 17X26 10 PK STRL BLUE (TOWEL DISPOSABLE) ×3 IMPLANT
TOWEL OR NON WOVEN STRL DISP B (DISPOSABLE) IMPLANT
TRAY FOLEY CATH 14FRSI W/METER (CATHETERS) ×3 IMPLANT
WATER STERILE IRR 1500ML POUR (IV SOLUTION) ×3 IMPLANT
WRAP KNEE MAXI GEL POST OP (GAUZE/BANDAGES/DRESSINGS) ×3 IMPLANT

## 2013-10-03 NOTE — Anesthesia Preprocedure Evaluation (Addendum)
Anesthesia Evaluation  Patient identified by MRN, date of birth, ID band Patient awake    Reviewed: Allergy & Precautions, H&P , NPO status , Patient's Chart, lab work & pertinent test results  Airway Mallampati: II TM Distance: >3 FB Neck ROM: Full    Dental  (+) Dental Advisory Given   Pulmonary neg pulmonary ROS,  breath sounds clear to auscultation        Cardiovascular negative cardio ROS  Rhythm:Regular Rate:Normal     Neuro/Psych negative neurological ROS  negative psych ROS   GI/Hepatic negative GI ROS, Neg liver ROS,   Endo/Other  negative endocrine ROS  Renal/GU negative Renal ROS     Musculoskeletal negative musculoskeletal ROS (+)   Abdominal   Peds  Hematology negative hematology ROS (+)   Anesthesia Other Findings   Reproductive/Obstetrics negative OB ROS                          Anesthesia Physical Anesthesia Plan  ASA: II  Anesthesia Plan: Spinal   Post-op Pain Management:    Induction:   Airway Management Planned:   Additional Equipment:   Intra-op Plan:   Post-operative Plan:   Informed Consent: I have reviewed the patients History and Physical, chart, labs and discussed the procedure including the risks, benefits and alternatives for the proposed anesthesia with the patient or authorized representative who has indicated his/her understanding and acceptance.   Dental advisory given  Plan Discussed with: CRNA  Anesthesia Plan Comments:        Anesthesia Quick Evaluation

## 2013-10-03 NOTE — Transfer of Care (Signed)
Immediate Anesthesia Transfer of Care Note  Patient: Danny Oneill  Procedure(s) Performed: Procedure(s): RIGHT TOTAL KNEE ARTHROPLASTY (Right)  Patient Location: PACU  Anesthesia Type:Spinal  Level of Consciousness: awake and alert   Airway & Oxygen Therapy: Patient Spontanous Breathing and Patient connected to nasal cannula oxygen  Post-op Assessment: Report given to PACU RN and Post -op Vital signs reviewed and stable  Post vital signs: Reviewed and stable  Complications: No apparent anesthesia complications

## 2013-10-03 NOTE — Anesthesia Procedure Notes (Signed)
Spinal  Patient location during procedure: OR Start time: 10/03/2013 2:32 PM End time: 10/03/2013 2:37 PM Staffing CRNA/Resident: EARGLE, BETH E Performed by: resident/CRNA  Preanesthetic Checklist Completed: patient identified, site marked, surgical consent, pre-op evaluation, timeout performed, IV checked, risks and benefits discussed and monitors and equipment checked Spinal Block Patient position: sitting Prep: Betadine Patient monitoring: heart rate, continuous pulse ox and blood pressure Approach: right paramedian Location: L3-4 Injection technique: single-shot Needle Needle type: Sprotte  Needle gauge: 22 G Needle length: 9 cm Additional Notes Time out performed, kit expiration checked, sitting position. SABx2attempts, clear CSF, neg. Heme, neg paresthesia. tol well, return to supine.   

## 2013-10-03 NOTE — Op Note (Signed)
NAME:  Danny Oneill                      MEDICAL RECORD NO.:  [id number]                             [facility]      PHYSICIAN:  Madlyn FrankelMatthew D. Charlann Boxerlin, M.D.  DATE OF BIRTH:  [birth year]      DATE OF PROCEDURE:  10/03/2013                                     OPERATIVE REPORT         PREOPERATIVE DIAGNOSIS:  Right knee osteoarthritis.      POSTOPERATIVE DIAGNOSIS:  Right knee osteoarthritis.      FINDINGS:  The patient was noted to have complete loss of cartilage and   bone-on-bone arthritis with associated osteophytes in the lateral and patellofemoral compartments of   the knee with a significant synovitis and associated effusion.      PROCEDURE:  Right total knee replacement.      COMPONENTS USED:  DePuy rotating platform posterior stabilized knee   system, a size 5 femur, 5 tibia, 10 mm PS insert, and 41 patellar   button.      SURGEON:  Madlyn FrankelMatthew D. Charlann Boxerlin, M.D.      ASSISTANT:  Lanney GinsMatthew Babish, PA-C.      ANESTHESIA:  Spinal.      SPECIMENS:  None.      COMPLICATION:  None.      DRAINS:  none  EBL:  <100cc      TOURNIQUET TIME:   Total Tourniquet Time Documented: Thigh (Right) - 41 minutes Total: Thigh (Right) - 41 minutes  .      The patient was stable to the recovery room.      INDICATION FOR PROCEDURE:  Danny Oneill is a 80 y.o. male patient of   mine.  The patient had been seen, evaluated, and treated conservatively in the   office with medication, activity modification, and injections.  The patient had   radiographic changes of bone-on-bone arthritis with endplate sclerosis and osteophytes noted.      The patient failed conservative measures including medication, injections, and activity modification, and at this point was ready for more definitive measures.   Based on the radiographic changes and failed conservative measures, the patient   decided to proceed with total knee replacement.  Risks of infection,   DVT, component failure, need for revision surgery,  postop course, and   expectations were all   discussed and reviewed.  Consent was obtained for benefit of pain   relief.      PROCEDURE IN DETAIL:  The patient was brought to the operative theater.   Once adequate anesthesia, preoperative antibiotics, 2 gm of Ancef administered, the patient was positioned supine with the right thigh tourniquet placed.  The  right lower extremity was prepped and draped in sterile fashion.  A time-   out was performed identifying the patient, planned procedure, and   extremity.      The right lower [facility].  The leg was   exsanguinated, tourniquet elevated to 250 mmHg.  A midline incision was   made followed by median parapatellar arthrotomy.  Following initial   exposure, attention was first directed  to the patella.  Precut   measurement was noted to be 26 mm.  I resected down to 14-15 mm and used a   41 patellar button to restore patellar height as well as cover the cut   surface.      The lug holes were drilled and a metal shim was placed to protect the   patella from retractors and saw blades.      At this point, attention was now directed to the femur.  The femoral   canal was opened with a drill, irrigated to try to prevent fat emboli.  An   intramedullary rod was passed at 5 degrees valgus, 12 mm of bone was   resected off the distal femur due to pre-operative flexion contracture.  Following this resection, the tibia was   subluxated anteriorly.  Using the extramedullary guide, 4 mm of bone was resected off   the proximal lateral tibia, the most affected.  We confirmed the gap would be   stable medially and laterally with a 10 mm insert as well as confirmed   the cut was perpendicular in the coronal plane, checking with an alignment rod.      Once this was done, I sized the femur to be a size 5 in the anterior-   posterior dimension, chose a standard component based on medial and   lateral dimension.  The  size 5 rotation block was then pinned in   position anterior referenced using the C-clamp to set rotation.  The   anterior, posterior, and  chamfer cuts were made without difficulty nor   notching making certain that I was along the anterior cortex to help   with flexion gap stability.      The final box cut was made off the lateral aspect of distal femur.      At this point, the tibia was sized to be a size 5, the size 5 tray was   then pinned in position through the medial third of the tubercle,   drilled, and keel punched.  Trial reduction was now carried with a 5 femur,  5 tibia, a 10 mm insert, and the 41 patella botton.  The knee was brought to   extension, full extension with good flexion stability with the patella   tracking through the trochlea without application of pressure.  Given   all these findings, the trial components removed.  Final components were   opened and cement was mixed.  The knee was irrigated with normal saline   solution and pulse lavage.  The synovial lining was   then injected with 20cc of Exparel, 30cc of 0.25% Marcaine with epinephrine and 1 cc of Toradol,   total of 61 cc.      The knee was irrigated.  Final implants were then cemented onto clean and   dried cut surfaces of bone with the knee brought to extension with a 10 mm trial insert.      Once the cement had fully cured, the excess cement was removed   throughout the knee.  I confirmed I was satisfied with the range of   motion and stability, and the final 10 mm PS insert was chosen.  It was   placed into the knee.      The tourniquet had been let down at 41 minutes.  No significant   hemostasis required.  The   extensor mechanism was then reapproximated using #1 Vicryl with the knee   in flexion.  The   remaining wound was closed with 2-0 Vicryl and running 4-0 Monocryl.   The knee was cleaned, dried, dressed sterilely using Dermabond and   Aquacel dressing.  The patient was then   brought  to recovery room in stable condition, tolerating the procedure   well.   Please note that Physician Assistant, Lanney Gins, PA-C, was present for the entirety of the case, and was utilized for pre-operative positioning, peri-operative retractor management, general facilitation of the procedure.  He was also utilized for primary wound closure at the end of the case.              Madlyn Frankel Charlann Boxer, M.D.    10/03/2013 4:21 PM

## 2013-10-03 NOTE — Interval H&P Note (Signed)
History and Physical Interval Note:  10/03/2013 1:25 PM  Danny Oneill  has presented today for surgery, with the diagnosis of RIGHT KNEE OA   The various methods of treatment have been discussed with the patient and family. After consideration of risks, benefits and other options for treatment, the patient has consented to  Procedure(s): RIGHT TOTAL KNEE ARTHROPLASTY (Right) as a surgical intervention .  The patient's history has been reviewed, patient examined, no change in status, stable for surgery.  I have reviewed the patient's chart and labs.  Questions were answered to the patient's satisfaction.     [name]

## 2013-10-04 ENCOUNTER — Encounter (HOSPITAL_COMMUNITY): Payer: Self-pay | Admitting: Orthopedic Surgery

## 2013-10-04 DIAGNOSIS — D500 Iron deficiency anemia secondary to blood loss (chronic): Secondary | ICD-10-CM | POA: Diagnosis not present

## 2013-10-04 DIAGNOSIS — E663 Overweight: Secondary | ICD-10-CM | POA: Diagnosis present

## 2013-10-04 LAB — BASIC METABOLIC PANEL
BUN: 21 mg/dL (ref 6–23)
CO2: 25 mEq/L (ref 19–32)
CREATININE: 0.94 mg/dL (ref 0.50–1.35)
Calcium: 8.6 mg/dL (ref 8.4–10.5)
Chloride: 101 mEq/L (ref 96–112)
GFR, EST AFRICAN AMERICAN: 89 mL/min — AB (ref >90)
GFR, EST NON AFRICAN AMERICAN: 77 mL/min — AB (ref >90)
Glucose, Bld: 157 mg/dL — ABNORMAL HIGH (ref 70–99)
Potassium: 4.8 mEq/L (ref 3.7–5.3)
Sodium: 139 mEq/L (ref 137–147)

## 2013-10-04 LAB — CBC
HEMATOCRIT: 38.7 % — AB (ref 39.0–52.0)
Hemoglobin: 12.8 g/dL — ABNORMAL LOW (ref 13.0–17.0)
MCH: 30.8 pg (ref 26.0–34.0)
MCHC: 33.1 g/dL (ref 30.0–36.0)
MCV: 93.3 fL (ref 78.0–100.0)
Platelets: 206 10*3/uL (ref 150–400)
RBC: 4.15 MIL/uL — ABNORMAL LOW (ref 4.22–5.81)
RDW: 13.2 % (ref 11.5–15.5)
WBC: 10.8 10*3/uL — ABNORMAL HIGH (ref 4.0–10.5)

## 2013-10-04 MED ORDER — ASPIRIN 325 MG PO TBEC: 325.0000 mg | DELAYED_RELEASE_TABLET | Freq: Two times a day (BID) | ORAL | Status: AC

## 2013-10-04 MED ORDER — DSS 100 MG PO CAPS: 100.0000 mg | ORAL_CAPSULE | Freq: Two times a day (BID) | ORAL | Status: DC

## 2013-10-04 MED ORDER — HYDROCODONE-ACETAMINOPHEN 7.5-325 MG PO TABS: 1.0000 | ORAL_TABLET | ORAL | Status: DC

## 2013-10-04 MED ORDER — TIZANIDINE HCL 4 MG PO TABS: 4.0000 mg | ORAL_TABLET | Freq: Four times a day (QID) | ORAL | Status: DC | PRN

## 2013-10-04 MED ORDER — FERROUS SULFATE 325 (65 FE) MG PO TABS: 325.0000 mg | ORAL_TABLET | Freq: Three times a day (TID) | ORAL | Status: DC

## 2013-10-04 MED ORDER — POLYETHYLENE GLYCOL 3350 17 G PO PACK: 17.0000 g | PACK | Freq: Two times a day (BID) | ORAL | Status: DC

## 2013-10-04 NOTE — Progress Notes (Signed)
[facility] is providing the following services: Patient declined rw and commode - has both at home already.  If patient discharges after hours, please call [phone].   Renard Hamper 10/04/2013, 11:04 AM

## 2013-10-04 NOTE — Anesthesia Postprocedure Evaluation (Signed)
Anesthesia Post Note  Patient: Danny Oneill  Procedure(s) Performed: Procedure(s) (LRB): RIGHT TOTAL KNEE ARTHROPLASTY (Right)  Anesthesia type: Spinal  Patient location: PACU  Post pain: Pain level controlled  Post assessment: Post-op Vital signs reviewed  Last Vitals:  Filed Vitals:   10/04/13 0541  BP: 105/65  Pulse: 72  Temp: 36.4 C  Resp: 16    Post vital signs: Reviewed  Level of consciousness: sedated  Complications: No apparent anesthesia complications

## 2013-10-04 NOTE — Progress Notes (Signed)
   Subjective: 1 Day Post-Op Procedure(s) (LRB): RIGHT TOTAL KNEE ARTHROPLASTY (Right)   Patient reports pain as mild, pain controlled. No events throughout the night. Ready to be discharged home if he does well with PT and pain stays controlled.   Objective:   VITALS:   Filed Vitals:   10/04/13 0541  BP: 105/65  Pulse: 72  Temp: 97.5 F (36.4 C)  Resp: 16    Neurovascular intact Dorsiflexion/Plantar flexion intact Incision: dressing C/D/I No cellulitis present Compartment soft  LABS  Recent Labs  10/04/13 0433  HGB 12.8*  HCT 38.7*  WBC 10.8*  PLT 206     Recent Labs  10/04/13 0433  NA 139  K 4.8  BUN 21  CREATININE 0.94  GLUCOSE 157*     Assessment/Plan: 1 Day Post-Op Procedure(s) (LRB): RIGHT TOTAL KNEE ARTHROPLASTY (Right) Foley cath d/c'ed Advance diet Up with therapy D/C IV fluids Discharge home with home health, if he does well with PT and pain controlled. Follow up in 2 weeks at [facility]. Follow up with OLIN,Zriyah Kopplin D in 2 weeks.  Contact information:  [facility] [address] [place] [address] [phone]    Expected ABLA  Treated with iron and will observe  Overweight (BMI 25-29.9) Estimated body mass index is 29.2 kg/(m^2) as calculated from the following:   Height as of this encounter: 6\' 2"  (1.88 m).   Weight as of this encounter: 103.193 kg (227 lb 8 oz). Patient also counseled that weight may inhibit the healing process Patient counseled that losing weight will help with future health issues        Anastasio Auerbach. Raheem Kolbe   PAC  10/04/2013, 9:59 AM

## 2013-10-04 NOTE — Progress Notes (Signed)
Physical Therapy Treatment Patient Details Name: Danny Oneill MRN: [id number] DOB: 05-Jul-1931 Today's Date: 30-Oct-2013    History of Present Illness pt is s/p R TKA.    PT Comments    Increased stability and increased activity tolerance vs am.    Follow Up Recommendations  Home health PT     Equipment Recommendations  None recommended by PT    Recommendations for Other Services OT consult     Precautions / Restrictions Precautions Precautions: Fall Restrictions Weight Bearing Restrictions: No Other Position/Activity Restrictions: WBAT    Mobility  Bed Mobility Overal bed mobility: Needs Assistance Bed Mobility: Supine to Sit;Sit to Supine     Supine to sit: Min assist Sit to supine: Min assist   General bed mobility comments: cues for sequence and use of L LE to self assist  Transfers Overall transfer level: Needs assistance Equipment used: Rolling walker (2 wheeled) Transfers: Sit to/from Stand Sit to Stand: Min assist         General transfer comment: cues for UE/LE placement  Ambulation/Gait Ambulation/Gait assistance: Min assist Ambulation Distance (Feet): 123 Feet Assistive device: Rolling walker (2 wheeled) Gait Pattern/deviations: Step-to pattern;Decreased step length - right;Decreased step length - left;Shuffle;Trunk flexed     General Gait Details: cues for sequence, posture, stride length and position from RW; physical assist for steady and walker management   Stairs            Wheelchair Mobility    Modified Rankin (Stroke Patients Only)       Balance                                    Cognition Arousal/Alertness: Awake/alert Behavior During Therapy: WFL for tasks assessed/performed Overall Cognitive Status: Within Functional Limits for tasks assessed                      Exercises Total Joint Exercises Ankle Circles/Pumps: AROM;Both;15 reps;Supine Quad Sets: AROM;Both;10 reps;Supine Heel Slides:  AAROM;Right;10 reps;Supine Straight Leg Raises: AAROM;AROM;Right;15 reps;Supine    General Comments        Pertinent Vitals/Pain 4/10; premed, ice packs provided    Home Living                      Prior Function            PT Goals (current goals can now be found in the care plan section) Acute Rehab PT Goals Patient Stated Goal: Resume previous lifestyle with decreased pain PT Goal Formulation: With patient Time For Goal Achievement: 10/11/13 Potential to Achieve Goals: Good Progress towards PT goals: Progressing toward goals    Frequency  7X/week    PT Plan Current plan remains appropriate    Co-evaluation             End of Session Equipment Utilized During Treatment: Gait belt Activity Tolerance: Patient tolerated treatment well Patient left: in bed;with call bell/phone within reach     Time: 1419-1452 PT Time Calculation (min): 33 min  Charges:  $Gait Training: 8-22 mins $Therapeutic Exercise: 8-22 mins                    G Codes:      Danny Oneill 10/30/2013, 4:02 PM

## 2013-10-04 NOTE — Progress Notes (Signed)
Scheduled procedure is on Medicare IP only list.  Will need order for INPATIENT post-op. Thanks you  

## 2013-10-04 NOTE — Evaluation (Signed)
Physical Therapy Evaluation Patient Details Name: Danny Oneill MRN: [id number] DOB: 10-Dec-1931 Today's Date: 10/04/2013   History of Present Illness  pt is s/p R TKA.  Clinical Impression  Pt s/p R TKR presents with decreased R LE strength/ROM and post op pain limiting functional mobility.  Pt should progress well to d/c home with family assist and HHPT     Follow Up Recommendations Home health PT    Equipment Recommendations  None recommended by PT    Recommendations for Other Services OT consult     Precautions / Restrictions Precautions Precautions: Fall Restrictions Weight Bearing Restrictions: No Other Position/Activity Restrictions: WBAT      Mobility  Bed Mobility Overal bed mobility: Needs Assistance Bed Mobility: Supine to Sit     Supine to sit: Min assist     General bed mobility comments: cues for sequence and use of L LE to self assist  Transfers Overall transfer level: Needs assistance Equipment used: Rolling walker (2 wheeled) Transfers: Sit to/from Stand Sit to Stand: Min assist         General transfer comment: cues for UE/LE placement  Ambulation/Gait Ambulation/Gait assistance: Min assist Ambulation Distance (Feet): 74 Feet Assistive device: Rolling walker (2 wheeled) Gait Pattern/deviations: Step-to pattern;Decreased step length - right;Decreased step length - left;Shuffle;Trunk flexed     General Gait Details: cues for sequence, posture, stride length and position from RW; physical assist for steady and walker management  Stairs            Wheelchair Mobility    Modified Rankin (Stroke Patients Only)       Balance                                             Pertinent Vitals/Pain 3/10; premed, ice packs provided    Home Living Family/patient expects to be discharged to:: Private residence Living Arrangements: Spouse/significant other Available Help at Discharge: Family Type of Home: House Home  Access: Stairs to enter Entrance Stairs-Rails: Right Entrance Stairs-Number of Steps: 2 Home Layout: One level Home Equipment: Environmental consultantWalker - 2 wheels Additional Comments: can borrow 3;1 from son    Prior Function Level of Independence: Independent               Hand Dominance   Dominant Hand: Right    Extremity/Trunk Assessment   Upper Extremity Assessment: Overall WFL for tasks assessed           Lower Extremity Assessment: RLE deficits/detail RLE Deficits / Details: 3/5 quads with AAROM at knee -10 - 90    Cervical / Trunk Assessment: Normal  Communication   Communication: No difficulties  Cognition Arousal/Alertness: Awake/alert Behavior During Therapy: WFL for tasks assessed/performed Overall Cognitive Status: Within Functional Limits for tasks assessed                      General Comments      Exercises Total Joint Exercises Ankle Circles/Pumps: AROM;Both;15 reps;Supine Quad Sets: AROM;Both;10 reps;Supine Heel Slides: AAROM;Right;10 reps;Supine Straight Leg Raises: AAROM;AROM;Right;15 reps;Supine      Assessment/Plan    PT Assessment Patient needs continued PT services  PT Diagnosis Difficulty walking   PT Problem List Decreased strength;Decreased range of motion;Decreased activity tolerance;Decreased mobility;Decreased knowledge of use of DME;Pain  PT Treatment Interventions DME instruction;Gait training;Stair training;Functional mobility training;Therapeutic activities;Therapeutic exercise;Patient/family education   PT Goals (Current goals  can be found in the Care Plan section) Acute Rehab PT Goals Patient Stated Goal: Resume previous lifestyle with decreased pain PT Goal Formulation: With patient Time For Goal Achievement: 10/11/13 Potential to Achieve Goals: Good    Frequency 7X/week   Barriers to discharge        Co-evaluation               End of Session   Activity Tolerance: Patient tolerated treatment well Patient  left: in chair;with call bell/phone within reach;with family/visitor present Nurse Communication: Mobility status         Time: 0932-1008 PT Time Calculation (min): 36 min   Charges:   PT Evaluation $Initial PT Evaluation Tier I: 1 Procedure PT Treatments $Gait Training: 8-22 mins $Therapeutic Exercise: 8-22 mins   PT G Codes:          Samani Deal 10/04/2013, 12:20 PM

## 2013-10-04 NOTE — Evaluation (Signed)
Occupational Therapy Evaluation Patient Details Name: Danny Oneill MRN: [id number] DOB: 1932-01-13 Today's Date: 10/04/2013    History of Present Illness pt is s/p R TKA.   Clinical Impression   This 80 year old man was admitted for the above surgery.  All education was completed.  No further OT is needed at this time.    Follow Up Recommendations  No OT follow up    Equipment Recommendations  None recommended by OT    Recommendations for Other Services       Precautions / Restrictions Precautions Precautions: Fall Restrictions Weight Bearing Restrictions: No      Mobility Bed Mobility                  Transfers Overall transfer level: Needs assistance Equipment used: Rolling walker (2 wheeled) Transfers: Sit to/from Stand Sit to Stand: Min assist         General transfer comment: cues for UE/LE placement    Balance                                            ADL Overall ADL's : Needs assistance/impaired     Grooming: Oral care;Supervision/safety;Standing   Upper Body Bathing: Set up;Sitting   Lower Body Bathing: Moderate assistance;Sit to/from stand   Upper Body Dressing : Set up;Sitting   Lower Body Dressing: Maximal assistance;Sit to/from stand   Toilet Transfer: Minimal assistance;Ambulation   Toileting- Clothing Manipulation and Hygiene: Min guard;Sit to/from stand         General ADL Comments: Wife present at beginning of session.  Recommended pt sponge bathe until he can step into tub.  They can borrow a 3:1 from son, who recently had ortho sx.  Wife will assist pt with ADLs.  Performed bathing sitting in chair and standing at sink for peri area.     Vision                     Perception     Praxis      Pertinent Vitals/Pain R knee sore; repositioned and ice applied     Hand Dominance     Extremity/Trunk Assessment Upper Extremity Assessment Upper Extremity Assessment: Overall WFL for tasks  assessed           Communication Communication Communication: No difficulties   Cognition Arousal/Alertness: Awake/alert Behavior During Therapy: WFL for tasks assessed/performed Overall Cognitive Status: Within Functional Limits for tasks assessed                     General Comments       Exercises       Shoulder Instructions      Home Living Family/patient expects to be discharged to:: Private residence Living Arrangements: Spouse/significant other                 Bathroom Shower/Tub: Tub/shower unit Shower/tub characteristics: Engineer, building services: Standard         Additional Comments: can borrow 3;1 from son      Prior Functioning/Environment Level of Independence: Independent             OT Diagnosis:     OT Problem List:     OT Treatment/Interventions:      OT Goals(Current goals can be found in the care plan section)    OT Frequency:  Barriers to D/C:            Co-evaluation              End of Session    Activity Tolerance: Patient tolerated treatment well Patient left: in chair;with call bell/phone within reach   Time: 1033-1102 OT Time Calculation (min): 29 min Charges:  OT General Charges $OT Visit: 1 Procedure OT Evaluation $Initial OT Evaluation Tier I: 1 Procedure OT Treatments $Self Care/Home Management : 23-37 mins G-Codes:    Story Conti 10/04/2013, 11:40 AM  Marica OtterMaryellen Jahnia Hewes, OTR/L [phone] 10/04/2013

## 2013-10-05 LAB — BASIC METABOLIC PANEL
Anion gap: 11 (ref 5–15)
BUN: 27 mg/dL — ABNORMAL HIGH (ref 6–23)
CO2: 23 mEq/L (ref 19–32)
Calcium: 8.6 mg/dL (ref 8.4–10.5)
Chloride: 101 mEq/L (ref 96–112)
Creatinine, Ser: 0.85 mg/dL (ref 0.50–1.35)
GFR calc Af Amer: >90 mL/min (ref >90)
GFR, EST NON AFRICAN AMERICAN: 80 mL/min — AB (ref >90)
Glucose, Bld: 126 mg/dL — ABNORMAL HIGH (ref 70–99)
POTASSIUM: 5.3 meq/L (ref 3.7–5.3)
SODIUM: 135 meq/L — AB (ref 137–147)

## 2013-10-05 LAB — CBC
HCT: 35.8 % — ABNORMAL LOW (ref 39.0–52.0)
Hemoglobin: 11.8 g/dL — ABNORMAL LOW (ref 13.0–17.0)
MCH: 31.1 pg (ref 26.0–34.0)
MCHC: 33 g/dL (ref 30.0–36.0)
MCV: 94.5 fL (ref 78.0–100.0)
Platelets: 176 10*3/uL (ref 150–400)
RBC: 3.79 MIL/uL — ABNORMAL LOW (ref 4.22–5.81)
RDW: 13.5 % (ref 11.5–15.5)
WBC: 11.7 10*3/uL — ABNORMAL HIGH (ref 4.0–10.5)

## 2013-10-05 NOTE — Progress Notes (Signed)
   Subjective: 2 Days Post-Op Procedure(s) (LRB): RIGHT TOTAL KNEE ARTHROPLASTY (Right)   Patient reports pain as mild, pain controlled. No events throughout the night. Ready to be discharged home if he does well with PT and pain controlled.  Objective:   VITALS:   Filed Vitals:   10/05/13  BP: 175/74  Pulse: 72  Temp: 98.5 F (36.9 C)   Resp: 18    Neurovascular intact Dorsiflexion/Plantar flexion intact Incision: dressing C/D/I No cellulitis present Compartment soft  LABS  Recent Labs  10/04/13 0433 10/05/13 0500  HGB 12.8* 11.8*  HCT 38.7* 35.8*  WBC 10.8* 11.7*  PLT 206 176     Recent Labs  10/04/13 0433 10/05/13 0500  NA 139 135*  K 4.8 5.3  BUN 21 27*  CREATININE 0.94 0.85  GLUCOSE 157* 126*     Assessment/Plan: 2 Days Post-Op Procedure(s) (LRB): RIGHT TOTAL KNEE ARTHROPLASTY (Right) Up with therapy Discharge home with home health Follow up in 2 weeks at [facility]. Follow up with OLIN,Leiann Sporer D in 2 weeks.  Contact information:  [facility] [address], [address] [place] [address] [phone]    Expected ABLA  Treated with iron and will observe   Overweight (BMI 25-29.9)  Estimated body mass index is 29.2 kg/(m^2) as calculated from the following:      Height as of this encounter: 6\' 2"  (1.88 m).      Weight as of this encounter: 103.193 kg (227 lb 8 oz).  Patient also counseled that weight may inhibit the healing process  Patient counseled that losing weight will help with future health issues       Anastasio Auerbach. Frisco Cordts   PAC  10/05/2013, 10:04 AM

## 2013-10-05 NOTE — Progress Notes (Signed)
Pt to d/c home with [facility] home health. No DME needs. AVS reviewed and "My Chart" discussed with pt. Pt capable of verbalizing medications, dressing changes, signs and symptoms of infection, and follow-up appointments. Remains hemodynamically stable. No signs and symptoms of distress. Educated pt to return to ER in the case of SOB, dizziness, or chest pain.  

## 2013-10-05 NOTE — Progress Notes (Signed)
Physical Therapy Treatment Patient Details Name: Danny Oneill MRN: [id number] DOB: [birth year] Today's Date: 11/04/2013    History of Present Illness pt is s/p R TKA.    PT Comments    Reviewed stairs, ice packs and car transfers with pt.  Handouts for stairs and home therex provided  Follow Up Recommendations  Home health PT     Equipment Recommendations  None recommended by PT    Recommendations for Other Services OT consult     Precautions / Restrictions Precautions Precautions: Fall Restrictions Weight Bearing Restrictions: No Other Position/Activity Restrictions: WBAT    Mobility  Bed Mobility Overal bed mobility: Needs Assistance Bed Mobility: Supine to Sit       Sit to supine: Supervision   General bed mobility comments: cues for sequence and use of L LE to self assist  Transfers Overall transfer level: Needs assistance Equipment used: Rolling walker (2 wheeled) Transfers: Sit to/from Stand Sit to Stand: Supervision         General transfer comment: cues for UE/LE placement  Ambulation/Gait Ambulation/Gait assistance: Min guard;Supervision Ambulation Distance (Feet): 159 Feet Assistive device: Rolling walker (2 wheeled) Gait Pattern/deviations: Step-to pattern;Step-through pattern;Decreased step length - right;Decreased step length - left;Shuffle;Trunk flexed     General Gait Details: cues for sequence, posture, stride length and position from RW; physical assist for steady and walker management   Stairs Stairs: Yes Stairs assistance: Min assist Stair Management: One rail Right;Step to pattern;Forwards;With crutches Number of Stairs: 5 General stair comments: cues for sequence and crutch/foot placement  Wheelchair Mobility    Modified Rankin (Stroke Patients Only)       Balance                                    Cognition Arousal/Alertness: Awake/alert Behavior During Therapy: WFL for tasks assessed/performed Overall  Cognitive Status: Within Functional Limits for tasks assessed                      Exercises Total Joint Exercises Ankle Circles/Pumps: AROM;Both;Supine;20 reps Quad Sets: AROM;Both;Supine;20 reps Heel Slides: AAROM;Right;Supine;20 reps Straight Leg Raises: AAROM;AROM;Right;Supine;20 reps    General Comments        Pertinent Vitals/Pain 3/10; premed; cold packs provided    Home Living                      Prior Function            PT Goals (current goals can now be found in the care plan section) Acute Rehab PT Goals Patient Stated Goal: Resume previous lifestyle with decreased pain PT Goal Formulation: With patient Time For Goal Achievement: 10/11/13 Potential to Achieve Goals: Good Progress towards PT goals: Progressing toward goals    Frequency  7X/week    PT Plan Current plan remains appropriate    Co-evaluation             End of Session Equipment Utilized During Treatment: Gait belt Activity Tolerance: Patient tolerated treatment well Patient left: in chair;with call bell/phone within reach;with family/visitor present     Time: 1113-1200 PT Time Calculation (min): 47 min  Charges:  $Gait Training: 8-22 mins $Therapeutic Exercise: 8-22 mins $Therapeutic Activity: 8-22 mins                    G Codes:      Danny Oneill Nov 04, 2013, 1:30 PM

## 2013-10-05 NOTE — Plan of Care (Signed)
Problem: Consults Goal: Diagnosis- Total Joint Replacement Outcome: Completed/Met Date Met:  10/05/13 Primary Total Knee RIGHT  Problem: Phase III Progression Outcomes Goal: Anticoagulant follow-up in place Outcome: Not Applicable Date Met:  96/28/36 ASA VTE, no f/u needed.

## 2013-10-07 NOTE — Discharge Summary (Signed)
Physician Discharge Summary  Patient ID: Danny Oneill MRN: [id number] DOB/AGE: 1931/09/09 80 y.o.  Admit date: 10/03/2013 Discharge date: 10/05/2013   Procedures:  Procedure(s) (LRB): RIGHT TOTAL KNEE ARTHROPLASTY (Right)  Attending Physician:  Dr. Durene Romans   Admission Diagnoses:   Right knee OA / pain  Discharge Diagnoses:  Principal Problem:   S/P right TKA Active Problems:   Overweight (BMI 25.0-29.9)   Expected blood loss anemia  Past Medical History  Diagnosis Date  . Arthritis   . Hyperlipidemia   . Lumbar spondylosis   . Asbestosis     "no brething problems" pt states worked in [employer] for many yrs and was exposed to asbestosis    HPI: Danny Oneill, 80 y.o. male, has a history of pain and functional disability in the right knee due to arthritis and has failed non-surgical conservative treatments for greater than 12 weeks to includeNSAID's and/or analgesics and activity modification. Onset of symptoms was gradual, starting 1+ years ago with gradually worsening course since that time. The patient noted no past surgery on the right knee(s). Patient currently rates pain in the right knee(s) at 6 out of 10 with activity. Patient has worsening of pain with activity and weight bearing, pain that interferes with activities of daily living, pain with passive range of motion, crepitus and joint swelling. Patient has evidence of periarticular osteophytes and joint space narrowing by imaging studies. There is no active infection. Risks, benefits and expectations were discussed with the patient. Risks including but not limited to the risk of anesthesia, blood clots, nerve damage, blood vessel damage, failure of the prosthesis, infection and up to and including death. Patient understand the risks, benefits and expectations and wishes to proceed with surgery.   PCP: [name], MD   Discharged Condition: good  Hospital Course:  Patient underwent the above stated  procedure on 10/03/2013. Patient tolerated the procedure well and brought to the recovery room in good condition and subsequently to the floor.  POD #1 BP: 105/65 ; Pulse: 72 ; Temp: 97.5 F (36.4 C) ; Resp: 16  Patient reports pain as mild, pain controlled. No events throughout the night. Neurovascular intact, dorsiflexion/plantar flexion intact, incision: dressing C/D/I, no cellulitis present and compartment soft.   LABS  Basename    HGB  12.8  HCT  38.7   POD #2  BP: 175/74 ; Pulse: 72 ; Temp: 98.5 F (36.9 C) ; Resp: 18  Patient reports pain as mild, pain controlled. No events throughout the night. Ready to be discharged home Neurovascular intact, dorsiflexion/plantar flexion intact, incision: dressing C/D/I, no cellulitis present and compartment soft.   LABS  Basename    HGB  11.8  HCT  35.8    Discharge Exam: General appearance: alert, cooperative and no distress Extremities: Homans sign is negative, no sign of DVT, no edema, redness or tenderness in the calves or thighs and no ulcers, gangrene or trophic changes  Disposition: Home with follow up in 2 weeks   Follow-up Information   Follow up with [name], MD. Schedule an appointment as soon as possible for a visit in 2 weeks.   Specialty:  Orthopedic Surgery   Contact information:   [address] [place] [address] [phone]       Discharge Instructions   Call MD / Call 911    Complete by:  As directed   If you experience chest pain or shortness of breath, CALL 911 and be transported to the hospital  emergency room.  If you develope a fever above 101 F, pus (white drainage) or increased drainage or redness at the wound, or calf pain, call your surgeon's office.     Change dressing    Complete by:  As directed   Maintain surgical dressing for 10-14 days, or until follow up in the clinic.     Constipation Prevention    Complete by:  As directed   Drink plenty of fluids.  Prune juice  may be helpful.  You may use a stool softener, such as Colace (over the counter) 100 mg twice a day.  Use MiraLax (over the counter) for constipation as needed.     Diet - low sodium heart healthy    Complete by:  As directed      Discharge instructions    Complete by:  As directed   Maintain surgical dressing for 10-14 days, or until follow up in the clinic. [facility] Call with any questions or concerns.     Driving restrictions    Complete by:  As directed   No driving for 4 weeks     Increase activity slowly as tolerated    Complete by:  As directed      TED hose    Complete by:  As directed   Use stockings (TED hose) for 2 weeks on both leg(s).  You may remove them at night for sleeping.     Weight bearing as tolerated    Complete by:  As directed   Laterality:  right  Extremity:  Lower             Medication List    STOP taking these medications       aspirin 81 MG tablet  Replaced by:  aspirin 325 MG EC tablet     ibuprofen 200 MG tablet  Commonly known as:  ADVIL,MOTRIN      TAKE these medications       aspirin 325 MG EC tablet  Take 1 tablet (325 mg total) by mouth 2 (two) times daily.     DSS 100 MG Caps  Take 100 mg by mouth 2 (two) times daily.     ferrous sulfate 325 (65 FE) MG tablet  Take 1 tablet (325 mg total) by mouth 3 (three) times daily after meals.     HYDROcodone-acetaminophen 7.5-325 MG per tablet  Commonly known as:  NORCO  Take 1-2 tablets by mouth every 4 (four) hours.     polyethylene glycol packet  Commonly known as:  MIRALAX / GLYCOLAX  Take 17 g by mouth 2 (two) times daily.     tiZANidine 4 MG tablet  Commonly known as:  ZANAFLEX  Take 1 tablet (4 mg total) by mouth every 6 (six) hours as needed for muscle spasms.         Signed: Anastasio AuerbachMatthew S. Yuleni Burich   PA-C  10/07/2013, 12:20 AM

## 2013-10-30 ENCOUNTER — Ambulatory Visit: Payer: Medicare FFS | Attending: Orthopedic Surgery | Admitting: Physical Therapy

## 2013-10-30 DIAGNOSIS — R609 Edema, unspecified: Secondary | ICD-10-CM | POA: Insufficient documentation

## 2013-10-30 DIAGNOSIS — M25569 Pain in unspecified knee: Secondary | ICD-10-CM | POA: Diagnosis not present

## 2013-10-30 DIAGNOSIS — M25669 Stiffness of unspecified knee, not elsewhere classified: Secondary | ICD-10-CM | POA: Insufficient documentation

## 2013-10-30 DIAGNOSIS — R262 Difficulty in walking, not elsewhere classified: Secondary | ICD-10-CM | POA: Insufficient documentation

## 2013-10-31 ENCOUNTER — Ambulatory Visit: Payer: Medicare FFS | Admitting: Physical Therapy

## 2013-10-31 DIAGNOSIS — M25569 Pain in unspecified knee: Secondary | ICD-10-CM | POA: Diagnosis not present

## 2013-11-07 ENCOUNTER — Ambulatory Visit: Payer: Medicare FFS | Attending: Orthopedic Surgery | Admitting: Physical Therapy

## 2013-11-07 DIAGNOSIS — R262 Difficulty in walking, not elsewhere classified: Secondary | ICD-10-CM | POA: Diagnosis not present

## 2013-11-07 DIAGNOSIS — M25669 Stiffness of unspecified knee, not elsewhere classified: Secondary | ICD-10-CM | POA: Insufficient documentation

## 2013-11-07 DIAGNOSIS — R609 Edema, unspecified: Secondary | ICD-10-CM | POA: Insufficient documentation

## 2013-11-07 DIAGNOSIS — M25569 Pain in unspecified knee: Secondary | ICD-10-CM | POA: Insufficient documentation

## 2013-11-08 ENCOUNTER — Ambulatory Visit: Payer: Medicare FFS | Admitting: Physical Therapy

## 2013-11-08 DIAGNOSIS — M25569 Pain in unspecified knee: Secondary | ICD-10-CM | POA: Diagnosis not present

## 2013-11-09 ENCOUNTER — Ambulatory Visit: Payer: Medicare FFS | Admitting: Physical Therapy

## 2013-11-13 ENCOUNTER — Ambulatory Visit: Payer: Medicare FFS | Admitting: Physical Therapy

## 2013-11-13 DIAGNOSIS — M25569 Pain in unspecified knee: Secondary | ICD-10-CM | POA: Diagnosis not present

## 2013-11-15 ENCOUNTER — Ambulatory Visit: Payer: Medicare FFS | Admitting: Physical Therapy

## 2013-11-15 DIAGNOSIS — M25569 Pain in unspecified knee: Secondary | ICD-10-CM | POA: Diagnosis not present

## 2013-11-17 ENCOUNTER — Ambulatory Visit: Payer: Medicare FFS | Admitting: Physical Therapy

## 2013-11-17 DIAGNOSIS — M25569 Pain in unspecified knee: Secondary | ICD-10-CM | POA: Diagnosis not present

## 2013-11-20 ENCOUNTER — Ambulatory Visit: Payer: Medicare FFS | Admitting: Physical Therapy

## 2013-11-20 DIAGNOSIS — M25569 Pain in unspecified knee: Secondary | ICD-10-CM | POA: Diagnosis not present

## 2013-11-22 ENCOUNTER — Ambulatory Visit: Payer: Medicare FFS | Admitting: Physical Therapy

## 2013-11-22 DIAGNOSIS — M25569 Pain in unspecified knee: Secondary | ICD-10-CM | POA: Diagnosis not present

## 2013-11-24 ENCOUNTER — Ambulatory Visit: Payer: Medicare FFS | Admitting: Physical Therapy

## 2013-11-24 DIAGNOSIS — M25569 Pain in unspecified knee: Secondary | ICD-10-CM | POA: Diagnosis not present

## 2013-11-27 ENCOUNTER — Ambulatory Visit: Payer: Medicare FFS | Admitting: Physical Therapy

## 2013-11-27 DIAGNOSIS — M25569 Pain in unspecified knee: Secondary | ICD-10-CM | POA: Diagnosis not present

## 2013-11-29 ENCOUNTER — Ambulatory Visit: Payer: Medicare FFS | Admitting: Physical Therapy

## 2013-12-01 ENCOUNTER — Ambulatory Visit: Payer: Medicare FFS | Admitting: Physical Therapy

## 2013-12-01 DIAGNOSIS — M25569 Pain in unspecified knee: Secondary | ICD-10-CM | POA: Diagnosis not present

## 2013-12-05 ENCOUNTER — Ambulatory Visit: Payer: Medicare FFS | Attending: Orthopedic Surgery | Admitting: Physical Therapy

## 2013-12-05 DIAGNOSIS — R262 Difficulty in walking, not elsewhere classified: Secondary | ICD-10-CM | POA: Diagnosis not present

## 2013-12-05 DIAGNOSIS — M25669 Stiffness of unspecified knee, not elsewhere classified: Secondary | ICD-10-CM | POA: Insufficient documentation

## 2013-12-05 DIAGNOSIS — R609 Edema, unspecified: Secondary | ICD-10-CM | POA: Insufficient documentation

## 2013-12-05 DIAGNOSIS — M25569 Pain in unspecified knee: Secondary | ICD-10-CM | POA: Diagnosis not present

## 2013-12-08 ENCOUNTER — Ambulatory Visit: Payer: Medicare FFS | Admitting: Physical Therapy

## 2013-12-14 ENCOUNTER — Ambulatory Visit: Payer: Medicare FFS | Admitting: Physical Therapy

## 2013-12-26 ENCOUNTER — Ambulatory Visit: Payer: Medicare FFS | Admitting: Physical Therapy

## 2013-12-26 DIAGNOSIS — M25569 Pain in unspecified knee: Secondary | ICD-10-CM | POA: Diagnosis not present

## 2014-03-28 ENCOUNTER — Ambulatory Visit: Payer: Self-pay | Admitting: Podiatry

## 2014-04-09 ENCOUNTER — Ambulatory Visit: Payer: Self-pay | Admitting: Podiatry

## 2016-01-05 HISTORY — PX: CORONARY STENT PLACEMENT: SHX1402

## 2016-02-02 ENCOUNTER — Encounter (HOSPITAL_COMMUNITY)
Admission: EM | Disposition: A | Discharge status: Skilled Nursing Facility | Payer: Self-pay | Source: Home / Self Care | Attending: Interventional Cardiology

## 2016-02-02 ENCOUNTER — Inpatient Hospital Stay (HOSPITAL_COMMUNITY)
Admission: EM | Admit: 2016-02-02 | Discharge: 2016-02-11 | DRG: 270 | Disposition: A | Discharge status: Skilled Nursing Facility | Payer: Medicare HMO | Attending: Interventional Cardiology | Admitting: Interventional Cardiology

## 2016-02-02 ENCOUNTER — Encounter (HOSPITAL_COMMUNITY): Payer: Self-pay | Admitting: *Deleted

## 2016-02-02 ENCOUNTER — Emergency Department (HOSPITAL_COMMUNITY): Payer: Medicare HMO

## 2016-02-02 ENCOUNTER — Inpatient Hospital Stay (HOSPITAL_COMMUNITY): Payer: Medicare HMO

## 2016-02-02 DIAGNOSIS — R7303 Prediabetes: Secondary | ICD-10-CM | POA: Diagnosis present

## 2016-02-02 DIAGNOSIS — I5021 Acute systolic (congestive) heart failure: Secondary | ICD-10-CM | POA: Diagnosis present

## 2016-02-02 DIAGNOSIS — R570 Cardiogenic shock: Secondary | ICD-10-CM | POA: Diagnosis present

## 2016-02-02 DIAGNOSIS — R7989 Other specified abnormal findings of blood chemistry: Secondary | ICD-10-CM | POA: Diagnosis not present

## 2016-02-02 DIAGNOSIS — Z885 Allergy status to narcotic agent status: Secondary | ICD-10-CM | POA: Diagnosis not present

## 2016-02-02 DIAGNOSIS — I2121 ST elevation (STEMI) myocardial infarction involving left circumflex coronary artery: Secondary | ICD-10-CM | POA: Diagnosis not present

## 2016-02-02 DIAGNOSIS — N183 Chronic kidney disease, stage 3 (moderate): Secondary | ICD-10-CM | POA: Diagnosis present

## 2016-02-02 DIAGNOSIS — R0789 Other chest pain: Secondary | ICD-10-CM | POA: Diagnosis not present

## 2016-02-02 DIAGNOSIS — R739 Hyperglycemia, unspecified: Secondary | ICD-10-CM | POA: Diagnosis not present

## 2016-02-02 DIAGNOSIS — I970 Postcardiotomy syndrome: Secondary | ICD-10-CM | POA: Diagnosis not present

## 2016-02-02 DIAGNOSIS — N289 Disorder of kidney and ureter, unspecified: Secondary | ICD-10-CM | POA: Diagnosis not present

## 2016-02-02 DIAGNOSIS — J61 Pneumoconiosis due to asbestos and other mineral fibers: Secondary | ICD-10-CM | POA: Diagnosis present

## 2016-02-02 DIAGNOSIS — I2510 Atherosclerotic heart disease of native coronary artery without angina pectoris: Secondary | ICD-10-CM | POA: Diagnosis not present

## 2016-02-02 DIAGNOSIS — I255 Ischemic cardiomyopathy: Secondary | ICD-10-CM | POA: Diagnosis present

## 2016-02-02 DIAGNOSIS — I9581 Postprocedural hypotension: Secondary | ICD-10-CM | POA: Diagnosis not present

## 2016-02-02 DIAGNOSIS — I959 Hypotension, unspecified: Secondary | ICD-10-CM

## 2016-02-02 DIAGNOSIS — I259 Chronic ischemic heart disease, unspecified: Secondary | ICD-10-CM

## 2016-02-02 DIAGNOSIS — R0602 Shortness of breath: Secondary | ICD-10-CM

## 2016-02-02 DIAGNOSIS — I213 ST elevation (STEMI) myocardial infarction of unspecified site: Secondary | ICD-10-CM

## 2016-02-02 DIAGNOSIS — R319 Hematuria, unspecified: Secondary | ICD-10-CM | POA: Diagnosis not present

## 2016-02-02 DIAGNOSIS — E785 Hyperlipidemia, unspecified: Secondary | ICD-10-CM | POA: Diagnosis present

## 2016-02-02 DIAGNOSIS — Z77090 Contact with and (suspected) exposure to asbestos: Secondary | ICD-10-CM | POA: Diagnosis present

## 2016-02-02 DIAGNOSIS — Z7982 Long term (current) use of aspirin: Secondary | ICD-10-CM | POA: Diagnosis not present

## 2016-02-02 DIAGNOSIS — N179 Acute kidney failure, unspecified: Secondary | ICD-10-CM | POA: Diagnosis present

## 2016-02-02 DIAGNOSIS — Z9889 Other specified postprocedural states: Secondary | ICD-10-CM

## 2016-02-02 DIAGNOSIS — I2119 ST elevation (STEMI) myocardial infarction involving other coronary artery of inferior wall: Secondary | ICD-10-CM | POA: Diagnosis present

## 2016-02-02 DIAGNOSIS — R262 Difficulty in walking, not elsewhere classified: Secondary | ICD-10-CM

## 2016-02-02 DIAGNOSIS — Z96651 Presence of right artificial knee joint: Secondary | ICD-10-CM | POA: Diagnosis present

## 2016-02-02 DIAGNOSIS — E875 Hyperkalemia: Secondary | ICD-10-CM | POA: Diagnosis present

## 2016-02-02 DIAGNOSIS — D72828 Other elevated white blood cell count: Secondary | ICD-10-CM | POA: Diagnosis not present

## 2016-02-02 DIAGNOSIS — D72829 Elevated white blood cell count, unspecified: Secondary | ICD-10-CM | POA: Diagnosis present

## 2016-02-02 DIAGNOSIS — I219 Acute myocardial infarction, unspecified: Secondary | ICD-10-CM | POA: Diagnosis not present

## 2016-02-02 HISTORY — PX: CARDIAC CATHETERIZATION: SHX172

## 2016-02-02 LAB — CBC WITH DIFFERENTIAL/PLATELET
BASOS PCT: 1 %
Basophils Absolute: 0.1 10*3/uL (ref 0.0–0.1)
EOS ABS: 0.3 10*3/uL (ref 0.0–0.7)
Eosinophils Relative: 3 %
HEMATOCRIT: 40.5 % (ref 39.0–52.0)
HEMOGLOBIN: 13.1 g/dL (ref 13.0–17.0)
LYMPHS ABS: 2.6 10*3/uL (ref 0.7–4.0)
Lymphocytes Relative: 30 %
MCH: 31 pg (ref 26.0–34.0)
MCHC: 32.3 g/dL (ref 30.0–36.0)
MCV: 96 fL (ref 78.0–100.0)
MONOS PCT: 11 %
Monocytes Absolute: 0.9 10*3/uL (ref 0.1–1.0)
NEUTROS ABS: 4.8 10*3/uL (ref 1.7–7.7)
NEUTROS PCT: 55 %
Platelets: 236 10*3/uL (ref 150–400)
RBC: 4.22 MIL/uL (ref 4.22–5.81)
RDW: 13.5 % (ref 11.5–15.5)
WBC: 8.6 10*3/uL (ref 4.0–10.5)

## 2016-02-02 LAB — BASIC METABOLIC PANEL
BUN: 31 mg/dL — AB (ref 4–21)
CREATININE: 1.8 mg/dL — AB (ref 0.6–1.3)
Glucose: 257 mg/dL
Potassium: 5.1 mmol/L (ref 3.4–5.3)
SODIUM: 136 mmol/L — AB (ref 137–147)

## 2016-02-02 LAB — CBC AND DIFFERENTIAL
HCT: 41 % (ref 41–53)
HEMOGLOBIN: 13.1 g/dL — AB (ref 13.5–17.5)
PLATELETS: 236 10*3/uL (ref 150–399)
WBC: 8.6 10^3/mL

## 2016-02-02 LAB — COMPREHENSIVE METABOLIC PANEL
ALBUMIN: 3.3 g/dL — AB (ref 3.5–5.0)
ALK PHOS: 44 U/L (ref 38–126)
ALT: 34 U/L (ref 17–63)
AST: 58 U/L — ABNORMAL HIGH (ref 15–41)
Anion gap: 8 (ref 5–15)
BILIRUBIN TOTAL: 0.7 mg/dL (ref 0.3–1.2)
BUN: 31 mg/dL — AB (ref 6–20)
CALCIUM: 8.7 mg/dL — AB (ref 8.9–10.3)
CO2: 24 mmol/L (ref 22–32)
CREATININE: 1.75 mg/dL — AB (ref 0.61–1.24)
Chloride: 104 mmol/L (ref 101–111)
GFR calc Af Amer: 40 mL/min — ABNORMAL LOW (ref >60)
GFR calc non Af Amer: 35 mL/min — ABNORMAL LOW (ref >60)
GLUCOSE: 257 mg/dL — AB (ref 65–99)
Potassium: 5.1 mmol/L (ref 3.5–5.1)
SODIUM: 136 mmol/L (ref 135–145)
TOTAL PROTEIN: 6.4 g/dL — AB (ref 6.5–8.1)

## 2016-02-02 LAB — PROTIME-INR
INR: 1.29
PROTHROMBIN TIME: 16.2 s — AB (ref 11.4–15.2)
Protime: 16.2 seconds — AB (ref 10.0–13.8)

## 2016-02-02 LAB — BRAIN NATRIURETIC PEPTIDE: B Natriuretic Peptide: 374.8 pg/mL — ABNORMAL HIGH (ref 0.0–100.0)

## 2016-02-02 LAB — APTT: APTT: 81 s — AB (ref 24–36)

## 2016-02-02 LAB — HEPATIC FUNCTION PANEL
ALK PHOS: 44 U/L (ref 25–125)
ALT: 34 U/L (ref 10–40)
AST: 58 U/L — AB (ref 14–40)
BILIRUBIN, TOTAL: 0.7 mg/dL

## 2016-02-02 LAB — CK TOTAL AND CKMB (NOT AT ARMC)
CK, MB: 4.8 ng/mL (ref 0.5–5.0)
Relative Index: 2.4 (ref 0.0–2.5)
Total CK: 198 U/L (ref 49–397)

## 2016-02-02 LAB — TROPONIN I: Troponin I: 10.84 ng/mL (ref <0.03)

## 2016-02-02 SURGERY — LEFT HEART CATH AND CORONARY ANGIOGRAPHY
Anesthesia: LOCAL

## 2016-02-02 MED ORDER — SODIUM CHLORIDE 0.9 % WEIGHT BASED INFUSION
1.0000 mL/kg/h | INTRAVENOUS | Status: AC
Start: 2016-02-02 — End: 2016-02-03

## 2016-02-02 MED ORDER — HEPARIN SODIUM (PORCINE) 5000 UNIT/ML IJ SOLN
INTRAMUSCULAR | Status: AC
  Administered 2016-02-02: 4000 [IU]
  Filled 2016-02-02: qty 1

## 2016-02-02 MED ORDER — DOCUSATE SODIUM 100 MG PO CAPS: 100.0000 mg | ORAL_CAPSULE | Freq: Two times a day (BID) | ORAL | Status: DC

## 2016-02-02 MED ORDER — HEPARIN (PORCINE) IN NACL 2-0.9 UNIT/ML-% IJ SOLN
INTRAMUSCULAR | Status: AC
  Filled 2016-02-02: qty 1000

## 2016-02-02 MED ORDER — BIVALIRUDIN 250 MG IV SOLR
INTRAVENOUS | Status: AC
  Filled 2016-02-02: qty 250

## 2016-02-02 MED ORDER — HYDROCODONE-ACETAMINOPHEN 7.5-325 MG PO TABS: 1.0000 | ORAL_TABLET | ORAL | Status: DC

## 2016-02-02 MED ORDER — ASPIRIN 81 MG PO CHEW
81.0000 mg | CHEWABLE_TABLET | Freq: Every day | ORAL | Status: DC
  Administered 2016-02-03 – 2016-02-11 (×9): 81 mg via ORAL
  Filled 2016-02-02 (×9): qty 1

## 2016-02-02 MED ORDER — ONDANSETRON HCL 4 MG/2ML IJ SOLN: 4.0000 mg | Freq: Four times a day (QID) | INTRAMUSCULAR | Status: DC | PRN

## 2016-02-02 MED ORDER — POLYETHYLENE GLYCOL 3350 17 G PO PACK: 17.0000 g | PACK | Freq: Two times a day (BID) | ORAL | Status: DC

## 2016-02-02 MED ORDER — HEPARIN (PORCINE) IN NACL 100-0.45 UNIT/ML-% IJ SOLN
1300.0000 [IU]/h | INTRAMUSCULAR | Status: DC
  Administered 2016-02-03 (×2): 1300 [IU]/h via INTRAVENOUS
  Filled 2016-02-02 (×2): qty 250

## 2016-02-02 MED ORDER — METOPROLOL TARTRATE 12.5 MG HALF TABLET: 12.5000 mg | ORAL_TABLET | Freq: Two times a day (BID) | ORAL | Status: DC

## 2016-02-02 MED ORDER — VERAPAMIL HCL 2.5 MG/ML IV SOLN
INTRAVENOUS | Status: AC
  Filled 2016-02-02: qty 2

## 2016-02-02 MED ORDER — TIZANIDINE HCL 4 MG PO TABS
4.0000 mg | ORAL_TABLET | Freq: Four times a day (QID) | ORAL | Status: DC | PRN
  Filled 2016-02-02: qty 1

## 2016-02-02 MED ORDER — FENTANYL CITRATE (PF) 100 MCG/2ML IJ SOLN
INTRAMUSCULAR | Status: AC
  Filled 2016-02-02: qty 2

## 2016-02-02 MED ORDER — SODIUM CHLORIDE 0.9% FLUSH
3.0000 mL | Freq: Two times a day (BID) | INTRAVENOUS | Status: DC
  Administered 2016-02-03 – 2016-02-11 (×17): 3 mL via INTRAVENOUS

## 2016-02-02 MED ORDER — BIVALIRUDIN BOLUS VIA INFUSION - CUPID
INTRAVENOUS | Status: DC | PRN
  Administered 2016-02-02: 74.85 mg via INTRAVENOUS

## 2016-02-02 MED ORDER — LIDOCAINE HCL (PF) 1 % IJ SOLN
INTRAMUSCULAR | Status: DC | PRN
Start: 2016-02-02 — End: 2016-02-02
  Administered 2016-02-02: 20 mL

## 2016-02-02 MED ORDER — MORPHINE SULFATE (PF) 2 MG/ML IV SOLN
2.0000 mg | INTRAVENOUS | Status: DC | PRN
  Administered 2016-02-03: 2 mg via INTRAVENOUS
  Administered 2016-02-03: 3 mg via INTRAVENOUS
  Administered 2016-02-03: 2 mg via INTRAVENOUS
  Filled 2016-02-02: qty 1
  Filled 2016-02-02: qty 2
  Filled 2016-02-02: qty 1

## 2016-02-02 MED ORDER — SODIUM CHLORIDE 0.9% FLUSH: 3.0000 mL | INTRAVENOUS | Status: DC | PRN

## 2016-02-02 MED ORDER — HEPARIN (PORCINE) IN NACL 2-0.9 UNIT/ML-% IJ SOLN
INTRAMUSCULAR | Status: DC | PRN
  Administered 2016-02-02: 1000 mL

## 2016-02-02 MED ORDER — ATORVASTATIN CALCIUM 80 MG PO TABS
80.0000 mg | ORAL_TABLET | Freq: Every day | ORAL | Status: DC
  Administered 2016-02-03 – 2016-02-07 (×4): 80 mg via ORAL
  Filled 2016-02-02 (×4): qty 1

## 2016-02-02 MED ORDER — LIDOCAINE HCL (PF) 1 % IJ SOLN
INTRAMUSCULAR | Status: AC
  Filled 2016-02-02: qty 30

## 2016-02-02 MED ORDER — SODIUM CHLORIDE 0.9 % IV SOLN: 250.0000 mL | INTRAVENOUS | Status: DC | PRN

## 2016-02-02 MED ORDER — ACETAMINOPHEN 325 MG PO TABS
650.0000 mg | ORAL_TABLET | ORAL | Status: DC | PRN
  Administered 2016-02-03 (×2): 650 mg via ORAL
  Filled 2016-02-02 (×2): qty 2

## 2016-02-02 MED ORDER — IOPAMIDOL (ISOVUE-370) INJECTION 76%
INTRAVENOUS | Status: DC | PRN
  Administered 2016-02-02: 120 mL via INTRA_ARTERIAL

## 2016-02-02 MED ORDER — SODIUM CHLORIDE 0.9 % IV SOLN
INTRAVENOUS | Status: DC | PRN
  Administered 2016-02-02 (×2): 1.75 mg/kg/h via INTRAVENOUS

## 2016-02-02 MED ORDER — TICAGRELOR 90 MG PO TABS
90.0000 mg | ORAL_TABLET | Freq: Two times a day (BID) | ORAL | Status: DC
  Administered 2016-02-03 – 2016-02-11 (×17): 90 mg via ORAL
  Filled 2016-02-02 (×17): qty 1

## 2016-02-02 MED ORDER — SODIUM CHLORIDE 0.9 % IV SOLN
INTRAVENOUS | Status: DC | PRN
  Administered 2016-02-02: 250 mL via INTRAVENOUS

## 2016-02-02 MED ORDER — FENTANYL CITRATE (PF) 100 MCG/2ML IJ SOLN
INTRAMUSCULAR | Status: DC | PRN
  Administered 2016-02-02 (×2): 25 ug via INTRAVENOUS

## 2016-02-02 MED ORDER — ADENOSINE (DIAGNOSTIC) FOR INTRACORONARY USE
INTRAVENOUS | Status: DC | PRN
  Administered 2016-02-02 (×2): 60 ug via INTRACORONARY

## 2016-02-02 MED ORDER — NITROGLYCERIN 1 MG/10 ML FOR IR/CATH LAB
INTRA_ARTERIAL | Status: DC | PRN
  Administered 2016-02-02: 200 ug via INTRACORONARY

## 2016-02-02 MED ORDER — IOPAMIDOL (ISOVUE-370) INJECTION 76%
INTRAVENOUS | Status: AC
  Filled 2016-02-02: qty 50

## 2016-02-02 MED ORDER — IOPAMIDOL (ISOVUE-370) INJECTION 76%
INTRAVENOUS | Status: AC
  Filled 2016-02-02: qty 125

## 2016-02-02 MED ORDER — TICAGRELOR 90 MG PO TABS
ORAL_TABLET | ORAL | Status: DC | PRN
  Administered 2016-02-02: 180 mg via ORAL

## 2016-02-02 MED ORDER — TICAGRELOR 90 MG PO TABS
ORAL_TABLET | ORAL | Status: AC
  Filled 2016-02-02: qty 2

## 2016-02-02 SURGICAL SUPPLY — 18 items
BALLN EMERGE MR 2.0X15 (BALLOONS) ×2
BALLN LINEAR 7.5FR IABP 40CC (BALLOONS) ×2
BALLOON EMERGE MR 2.0X15 (BALLOONS) ×1 IMPLANT
BALLOON LINEAR 7.5FR IABP 40CC (BALLOONS) ×1 IMPLANT
CATH INFINITI JR4 5F (CATHETERS) ×2 IMPLANT
DEVICE SECURE STATLOCK IABP (MISCELLANEOUS) ×4 IMPLANT
GUIDE CATH RUNWAY 6FR CLS3.5 (CATHETERS) ×2 IMPLANT
GUIDE CATH RUNWAY 6FR CLS4 (CATHETERS) ×2 IMPLANT
KIT ENCORE 26 ADVANTAGE (KITS) ×4 IMPLANT
KIT HEART LEFT (KITS) ×2 IMPLANT
PACK CARDIAC CATHETERIZATION (CUSTOM PROCEDURE TRAY) ×2 IMPLANT
SHEATH PINNACLE 6F 10CM (SHEATH) ×2 IMPLANT
STENT SYNERGY DES 2.25X16 (Permanent Stent) ×2 IMPLANT
TRANSDUCER W/STOPCOCK (MISCELLANEOUS) ×2 IMPLANT
TUBING CIL FLEX 10 FLL-RA (TUBING) ×2 IMPLANT
VALVE GUARDIAN II ~~LOC~~ HEMO (MISCELLANEOUS) ×2 IMPLANT
WIRE ASAHI PROWATER 180CM (WIRE) ×2 IMPLANT
WIRE EMERALD 3MM-J .035X150CM (WIRE) ×2 IMPLANT

## 2016-02-02 NOTE — Progress Notes (Signed)
ANTICOAGULATION CONSULT NOTE - Initial Consult  Pharmacy Consult for Heparin Indication: IABP  No Known Allergies  Patient Measurements: Height: 6\' 2"  (188 cm) Weight: 220 lb (99.8 kg) IBW/kg (Calculated) : 82.2  Vital Signs: BP: 98/69 (10/29 2223) Pulse Rate: 0 (10/29 2233)  Labs:  Recent Labs  02/02/16 2100  HGB 13.1  HCT 40.5  PLT 236  APTT 81*  LABPROT 16.2*  INR 1.29  CREATININE 1.75*  CKTOTAL 198  CKMB 4.8  TROPONINI 10.84*    Estimated Creatinine Clearance: 41.1 mL/min (by C-G formula based on SCr of 1.75 mg/dL (H)).   Medical History: Past Medical History:  Diagnosis Date  . Arthritis   . Asbestosis (HCC)    "no brething problems" pt states worked in [employer] for many yrs and was exposed to asbestosis  . Hyperlipidemia   . Lumbar spondylosis     Medications:  No current facility-administered medications on file prior to encounter.    Current Outpatient Prescriptions on File Prior to Encounter  Medication Sig Dispense Refill  . docusate sodium 100 MG CAPS Take 100 mg by mouth 2 (two) times daily. 10 capsule 0  . ferrous sulfate 325 (65 FE) MG tablet Take 1 tablet (325 mg total) by mouth 3 (three) times daily after meals.  3  . HYDROcodone-acetaminophen (NORCO) 7.5-325 MG per tablet Take 1-2 tablets by mouth every 4 (four) hours. 100 tablet 0  . polyethylene glycol (MIRALAX / GLYCOLAX) packet Take 17 g by mouth 2 (two) times daily. 14 each 0  . tiZANidine (ZANAFLEX) 4 MG tablet Take 1 tablet (4 mg total) by mouth every 6 (six) hours as needed for muscle spasms. 30 tablet 0     Assessment: 82 y.o. male admitted s/p STEMI/PCI, now with IAPB, for heparin  Goal of Therapy:  Heparin level 0.2-0.5 Monitor platelets by anticoagulation protocol: Yes   Plan:  Once Angiomax bag completed, start heparin 1300 units/hr Check heparin level in 8 hours.   Daryl Quiros, Gary Fleet 02/02/2016,11:14 PM

## 2016-02-02 NOTE — ED Triage Notes (Signed)
Patient presents via EMS with c/o CP.  Stated it started 2 days ago off and on but started constantly approx PTA by EMS

## 2016-02-02 NOTE — ED Provider Notes (Signed)
[facility]-EMERGENCY DEPT Provider Note   CSN: [id number] Arrival date & time: 02/02/16  2053     History   Chief Complaint Chief Complaint  Patient presents with  . Code STEMI    HPI Danny Oneill is a 82 y.o. male.  Patient presents as a Code STEMI from the field. He reports two days of intermittent chest pain and reports that substernal chest pressure began approximately 1-2 hours prior to arrival. EMS reports soft pressures of 80s/50s but normal mental status. Received aspirin but no nitroglycerin due to concern for inferior STEMI and his blood pressures. Denies any dyspnea, on arrival reports improvement in pain. No history of CAD though reports he does take a baby aspirin daily. Recently started on flagyl with concern for diverticulitis.   The history is provided by the patient and the EMS personnel. No language interpreter was used.  Chest Pain   This is a new problem. The current episode started 2 days ago. The problem occurs daily. The problem has been gradually worsening. The pain is associated with rest. The pain is present in the substernal region. The pain is at a severity of 3/10. The pain is mild. The quality of the pain is described as pressure-like. The pain does not radiate. Duration of episode(s) is 2 hours. The symptoms are aggravated by exertion. Pertinent negatives include no abdominal pain, no back pain, no cough, no fever, no shortness of breath and no vomiting. He has tried nothing for the symptoms. Risk factors include being elderly and male gender.  Pertinent negatives for past medical history include no CAD, no diabetes, no hypertension and no MI.  Procedure history is negative for cardiac catheterization, echocardiogram, stress echo and exercise treadmill test.    Past Medical History:  Diagnosis Date  . Arthritis   . Asbestosis (HCC)    "no brething problems" pt states worked in [employer] for many yrs and was exposed to asbestosis  . Hyperlipidemia   .  Lumbar spondylosis     Patient Active Problem List   Diagnosis Date Noted  . Acute inferolateral myocardial infarction (HCC) 02/02/2016  . Acute MI, inferolateral wall, initial episode of care (HCC)   . Arterial hypotension   . Overweight (BMI 25.0-29.9) 10/04/2013  . Expected blood loss anemia 10/04/2013  . S/P right TKA 10/03/2013    Past Surgical History:  Procedure Laterality Date  . CATARACTS REMOVED     BIL  . ROTATOR CUFF REPAIR  2011   L SHOULDER  . TOTAL KNEE ARTHROPLASTY Right 10/03/2013   Procedure: RIGHT TOTAL KNEE ARTHROPLASTY;  Surgeon: [name], MD;  Location: [facility] ORS;  Service: Orthopedics;  Laterality: Right;       Home Medications    Prior to Admission medications   Medication Sig Start Date End Date Taking? Authorizing Provider  docusate sodium 100 MG CAPS Take 100 mg by mouth 2 (two) times daily. 10/04/13   Lanney Gins, PA-C  ferrous sulfate 325 (65 FE) MG tablet Take 1 tablet (325 mg total) by mouth 3 (three) times daily after meals. 10/04/13   Lanney Gins, PA-C  HYDROcodone-acetaminophen (NORCO) 7.5-325 MG per tablet Take 1-2 tablets by mouth every 4 (four) hours. 10/04/13   Lanney Gins, PA-C  polyethylene glycol (MIRALAX / GLYCOLAX) packet Take 17 g by mouth 2 (two) times daily. 10/04/13   Lanney Gins, PA-C  tiZANidine (ZANAFLEX) 4 MG tablet Take 1 tablet (4 mg total) by mouth every 6 (six) hours as needed for muscle spasms. 10/04/13  Lanney Gins, PA-C    Family History No family history on file.  Social History Social History  Substance Use Topics  . Smoking status: Never Smoker  . Smokeless tobacco: Never Used  . Alcohol use No     Allergies   Review of patient's allergies indicates no known allergies.   Review of Systems Review of Systems  Constitutional: Negative for fever.  HENT: Negative.   Respiratory: Negative for cough and shortness of breath.   Cardiovascular: Positive for chest pain.  Gastrointestinal: Negative for  abdominal pain and vomiting.  Genitourinary: Negative.   Musculoskeletal: Negative for back pain.  Skin: Negative.   Allergic/Immunologic: Negative for immunocompromised state.  Neurological: Negative.   Hematological: Does not bruise/bleed easily.  Psychiatric/Behavioral: Negative.      Physical Exam Updated Vital Signs BP 99/66   Pulse 80   Resp (!) 27   Ht 6\' 2"  (1.88 m)   Wt 99.7 kg   SpO2 97%   BMI 28.22 kg/m   Physical Exam  Constitutional: He is oriented to person, place, and time. He appears well-developed and well-nourished. No distress.  Eyes: Conjunctivae and EOM are normal.  Neck: Normal range of motion. Neck supple.  Cardiovascular: Normal rate, regular rhythm, normal heart sounds and intact distal pulses.  Exam reveals no gallop and no friction rub.   No murmur heard. Pulmonary/Chest: Effort normal and breath sounds normal. No respiratory distress. He has no wheezes. He has no rales.  Neurological: He is alert and oriented to person, place, and time.  Skin: Skin is warm and dry. He is not diaphoretic.  Psychiatric: He has a normal mood and affect. His behavior is normal. Judgment and thought content normal.     ED Treatments / Results  Labs (all labs ordered are listed, but only abnormal results are displayed) Labs Reviewed  COMPREHENSIVE METABOLIC PANEL - Abnormal; Notable for the following:       Result Value   Glucose, Bld 257 (*)    BUN 31 (*)    Creatinine, Ser 1.75 (*)    Calcium 8.7 (*)    Total Protein 6.4 (*)    Albumin 3.3 (*)    AST 58 (*)    GFR calc non Af Amer 35 (*)    GFR calc Af Amer 40 (*)    All other components within normal limits  TROPONIN I - Abnormal; Notable for the following:    Troponin I 10.84 (*)    All other components within normal limits  BRAIN NATRIURETIC PEPTIDE - Abnormal; Notable for the following:    B Natriuretic Peptide 374.8 (*)    All other components within normal limits  PROTIME-INR - Abnormal; Notable  for the following:    Prothrombin Time 16.2 (*)    All other components within normal limits  APTT - Abnormal; Notable for the following:    aPTT 81 (*)    All other components within normal limits  MRSA PCR SCREENING  CBC WITH DIFFERENTIAL/PLATELET  CK TOTAL AND CKMB (NOT AT [facility])  LIPID PANEL  HEMOGLOBIN A1C  TROPONIN I  BASIC METABOLIC PANEL  CBC  HEPARIN LEVEL (UNFRACTIONATED)    EKG  EKG Interpretation None       Radiology Dg Chest Port 1 View  Result Date: 02/02/2016 CLINICAL DATA:  STEMI EXAM: PORTABLE CHEST 1 VIEW COMPARISON:  02/02/2016 at 20:59 FINDINGS: An intra aortic balloon pump is visible at the level of the proximal descending aorta. Unchanged mediastinal contours. Extensive calcified  pleural plaque is again evident. No airspace consolidation. No large effusion. No pneumothorax. IMPRESSION: Intra-aortic balloon pump appears satisfactorily positioned. Extensive calcified pleural plaque. Electronically Signed   By: Ellery Plunkaniel R Mitchell M.D.   On: 02/02/2016 23:59   Dg Chest Portable 1 View  Result Date: 02/02/2016 CLINICAL DATA:  STEMI EXAM: PORTABLE CHEST 1 VIEW COMPARISON:  None. FINDINGS: There is extensive calcified pleural plaque. No confluent airspace consolidation is evident, but the study is limited due to the pleural changes. No large effusions. No pneumothorax. Hilar, mediastinal and cardiac contours are unremarkable. IMPRESSION: Extensive calcified pleural plaque. No large effusion or confluent airspace consolidation. Electronically Signed   By: Ellery Plunkaniel R Mitchell M.D.   On: 02/02/2016 21:25    Procedures Procedures (including critical care time)  Medications Ordered in ED Medications  sodium chloride flush (NS) 0.9 % injection 3 mL (3 mLs Intravenous Given 02/03/16 0230)  sodium chloride flush (NS) 0.9 % injection 3 mL (not administered)  0.9 %  sodium chloride infusion (not administered)  acetaminophen (TYLENOL) tablet 650 mg (not administered)    ondansetron (ZOFRAN) injection 4 mg (not administered)  0.9% sodium chloride infusion (1 mL/kg/hr  99.8 kg Intravenous Transfusing/Transfer 02/02/16 2315)  aspirin chewable tablet 81 mg (not administered)  ticagrelor (BRILINTA) tablet 90 mg (not administered)  atorvastatin (LIPITOR) tablet 80 mg (not administered)  morphine 2 MG/ML injection 2-3 mg (2 mg Intravenous Given 02/03/16 0055)  heparin ADULT infusion 100 units/mL (25000 units/24350mL sodium chloride 0.45%) (1,300 Units/hr Intravenous New Bag/Given 02/03/16 0000)  heparin 5000 UNIT/ML injection (4,000 Units  Given 02/02/16 2100)     Initial Impression / Assessment and Plan / ED Course  I have reviewed the triage vital signs and the nursing notes.  Pertinent labs & imaging results that were available during my care of the patient were reviewed by me and considered in my medical decision making (see chart for details).  Clinical Course    Patient presents as code STEMI for 2 days intermittent chest pain and 2 hours of constant chest pain, found by EMS to have inferior STEMI. Cardiology at bedside on arrival. On presentation he is alert and oriented, protecting his airway. Blood pressure 90s/60s on arrival with strong peripheral pulses. Portable chest x-ray unremarkable. Peripheral IVs established and he was given 4000U of heparin on discussion with cardiology. Has already received ASA 324 mg and will get ticagrelor in the cath lab. He was taken directly to the cath lab from the ED.  Final Clinical Impressions(s) / ED Diagnoses   Final diagnoses:  Acute ST elevation myocardial infarction (STEMI), unspecified artery (HCC)  Chest pain due to myocardial ischemia, unspecified ischemic chest pain type [facility](HCC)    New Prescriptions Current Discharge Medication List       Preston FleetingAnna Lyrique Hakim, MD 02/03/16 0124    Nelva Nayobert Beaton, MD 02/16/16 [phone]

## 2016-02-02 NOTE — ED Notes (Signed)
Patient taken to the cath lab 

## 2016-02-02 NOTE — H&P (Addendum)
Chief Complaint: STEMI   HPI: 82 yo M w hx of arthritis s/p R knee arthroplasty, asbestosis presented to the ED by EMS with chest pain, found to have inferolateral STEMI.  Per patient and wife, pt has not been feeling well for last several days, had a low grade fever 3 days prior to admission with some short lived lower abdominal cramping, went to PCP day later where no fever was seen and pain resolved but given prescription for flagyl to empirically treat diverticulitis. For last 48 hours, patient continued to feel fatigued and had new onset intermittent chest pains, but this evening around 2015 developed gradual onset substernal chest pain, constant, non-radiating, 9/10 severity. Never had pain like this before.  Denies associated sob, n/v/diaphoresis, palpitations, Lh/syncope.  Denies any cardiac hx, denies any hx of cath, heart surgery, MI or stress test. Only medication is ASA 81, does not take any anticoagulants. No history of bleeding/blood transfusion or CVA.  On presentation in the Ed, ECG confirmed STE in inferior and lateral leads. Patient received 4000 u heparin in ED and ASA 324 by EMS.  Patient taken emergently to the cath lab where received DES x1 to Lcx. Borderline hypotension during the case so IABP was placed.    Review of Systems:     Cardiac Review of Systems: {Y] = yes [ ]  = no  Chest Pain Cove.Etienne[y    ]  Resting SOB [ y  ] Exertional SOB  [n  ]  Pollyann Kennedyrthopnea Milo.Brash[n  ]   Pedal Edema Milo.Brash[n   ]    Palpitations Milo.Brash[n  ] Syncope  Milo.Brash[n  ]   Presyncope [ n  ]  General Review of Systems: [Y] = yes [n  ]=no Constitional: recent weight change [n  ]; anorexia Milo.Brash[n  ]; fatigue Cove.Etienne[y  ]; nausea [n  ]; night sweats [n  ]; fever [ n ]; or chills [ n ];                                                                     Dental: poor dentition[ n ];   Eye : blurred vision [n  ]; diplopia Milo.Brash[n   ]; vision changes [n  ];  Amaurosis fugax[n  ]; Resp: cough [n  ];  wheezing[n  ];  hemoptysis[  ]; shortness of breath[  ];  paroxysmal nocturnal dyspnea[  ]; dyspnea on exertion[  ]; or orthopnea[n  ];  GI:  gallstones[  ], vomiting[n  ];  dysphagia[n  ]; melena[n  ];  hematochezia [n  ]; heartburn[n  ];   GU: kidney stones [ n ]; hematuria[n  ];   dysuria [n  ];  nocturia[n  ];               Skin: rash [n  ], swelling[n  ];, hair loss[  ];  peripheral edema[  ];  or itching[  ]; Musculosketetal: myalgias[ n ];  joint swelling[n  ];  joint erythema[  ];  joint pain[  ];  back pain[n  ];  Heme/Lymph: bruising[n  ];  bleeding[n  ];  anemia[n  ];  Neuro: TIA[  ];  headaches[n  ];  stroke[  ];  vertigo[  ];  seizures[  ];  paresthesias[  ];  difficulty walking[  ];  Psych:depression[n  ]; anxiety[n  ];  Endocrine: diabetes[n  ];  thyroid dysfunction[n  ];  Other:  Past Medical History:  Diagnosis Date  . Arthritis   . Asbestosis (HCC)    "no brething problems" pt states worked in [employer] for many yrs and was exposed to asbestosis  . Hyperlipidemia   . Lumbar spondylosis     Home Meds: ASA 81   No Known Allergies  Social History   Social History  . Marital status: Married    Spouse name: N/A  . Number of children: N/A  . Years of education: N/A   Occupational History  . Not on file.   Social History Main Topics  . Smoking status: Never Smoker  . Smokeless tobacco: Never Used  . Alcohol use No  . Drug use: No  . Sexual activity: Not on file   Other Topics Concern  . Not on file   Social History Narrative  . No narrative on file    Family History: Non-contributory in 82 yo M   PHYSICAL EXAM: Vitals:   02/02/16 2228 02/02/16 2233  BP:    Pulse: (!) 0 (!) 0  Resp: (!) 0 (!) 0   General:  Mild distress, pale, mildly diaphoretic. No respiratory difficulty. HEENT: normal Neck: supple. no JVD. Carotids 2+ bilat; no bruits. No lymphadenopathy or thryomegaly appreciated. Cor: PMI nondisplaced. Regular rate & rhythm. No rubs, gallops or murmurs. Lungs: bibasilar crackles Abdomen:  soft, nontender, nondistended. No hepatosplenomegaly. No bruits or masses. Good bowel sounds. Large ventral hernia Extremities: no cyanosis, clubbing, rash, edema. IABP in R groin with site c/d/i Neuro: alert & oriented x 3, cranial nerves grossly intact. moves all 4 extremities w/o difficulty. Affect pleasant.  ECG 2059: 1-2 mm STE in inferior and lateral leads and reciprocal ST depressions V1-V2  Results for orders placed or performed during the hospital encounter of 02/02/16 (from the past 24 hour(s))  CBC with Differential     Status: None   Collection Time: 02/02/16  9:00 PM  Result Value Ref Range   WBC 8.6 4.0 - 10.5 K/uL   RBC 4.22 4.22 - 5.81 MIL/uL   Hemoglobin 13.1 13.0 - 17.0 g/dL   HCT 16.1 09.6 - 04.5 %   MCV 96.0 78.0 - 100.0 fL   MCH 31.0 26.0 - 34.0 pg   MCHC 32.3 30.0 - 36.0 g/dL   RDW 40.9 81.1 - 91.4 %   Platelets 236 150 - 400 K/uL   Neutrophils Relative % 55 %   Neutro Abs 4.8 1.7 - 7.7 K/uL   Lymphocytes Relative 30 %   Lymphs Abs 2.6 0.7 - 4.0 K/uL   Monocytes Relative 11 %   Monocytes Absolute 0.9 0.1 - 1.0 K/uL   Eosinophils Relative 3 %   Eosinophils Absolute 0.3 0.0 - 0.7 K/uL   Basophils Relative 1 %   Basophils Absolute 0.1 0.0 - 0.1 K/uL  Comprehensive metabolic panel     Status: Abnormal   Collection Time: 02/02/16  9:00 PM  Result Value Ref Range   Sodium 136 135 - 145 mmol/L   Potassium 5.1 3.5 - 5.1 mmol/L   Chloride 104 101 - 111 mmol/L   CO2 24 22 - 32 mmol/L   Glucose, Bld 257 (H) 65 - 99 mg/dL   BUN 31 (H) 6 - 20 mg/dL   Creatinine, Ser 7.82 (H) 0.61 - 1.24 mg/dL   Calcium 8.7 (L) 8.9 -  10.3 mg/dL   Total Protein 6.4 (L) 6.5 - 8.1 g/dL   Albumin 3.3 (L) 3.5 - 5.0 g/dL   AST 58 (H) 15 - 41 U/L   ALT 34 17 - 63 U/L   Alkaline Phosphatase 44 38 - 126 U/L   Total Bilirubin 0.7 0.3 - 1.2 mg/dL   GFR calc non Af Amer 35 (L) >60 mL/min   GFR calc Af Amer 40 (L) >60 mL/min   Anion gap 8 5 - 15  Troponin I     Status: Abnormal    Collection Time: 02/02/16  9:00 PM  Result Value Ref Range   Troponin I 10.84 (HH) <0.03 ng/mL  CK total and CKMB (cardiac)not at [facility]     Status: None   Collection Time: 02/02/16  9:00 PM  Result Value Ref Range   Total CK 198 49 - 397 U/L   CK, MB 4.8 0.5 - 5.0 ng/mL   Relative Index 2.4 0.0 - 2.5  Brain natriuretic peptide     Status: Abnormal   Collection Time: 02/02/16  9:00 PM  Result Value Ref Range   B Natriuretic Peptide 374.8 (H) 0.0 - 100.0 pg/mL  Protime-INR     Status: Abnormal   Collection Time: 02/02/16  9:00 PM  Result Value Ref Range   Prothrombin Time 16.2 (H) 11.4 - 15.2 seconds   INR 1.29   APTT     Status: Abnormal   Collection Time: 02/02/16  9:00 PM  Result Value Ref Range   aPTT 81 (H) 24 - 36 seconds   Dg Chest Portable 1 View  Result Date: 02/02/2016 CLINICAL DATA:  STEMI EXAM: PORTABLE CHEST 1 VIEW COMPARISON:  None. FINDINGS: There is extensive calcified pleural plaque. No confluent airspace consolidation is evident, but the study is limited due to the pleural changes. No large effusions. No pneumothorax. Hilar, mediastinal and cardiac contours are unremarkable. IMPRESSION: Extensive calcified pleural plaque. No large effusion or confluent airspace consolidation. Electronically Signed   By: Ellery Plunk M.D.   On: 02/02/2016 21:25   LHC 02/02/16  Ost RCA lesion, 50 %stenosed. Mild pressure dampening with catheter engagement.  Ost LM lesion, 40 %stenosed.  Ost 1st Diag to 1st Diag lesion, 70 %stenosed.  LV end diastolic pressure is moderately elevated.  There is no aortic valve stenosis.  Mid LAD lesion, 10 %stenosed.  Ost 2nd Mrg to 2nd Mrg lesion, 100 %stenosed. A STENT SYNERGY DES 2.25X16 drug eluting stent was successfully placed.  Post intervention, there is a 0% residual stenosis.  Lat 2nd Mrg lesion, 80 %stenosed. The lesion is at the origin of a branch at the bifurcation distal to the stent.  Scheduled Meds: . aspirin  81 mg  Oral Daily  . atorvastatin  80 mg Oral q1800  . sodium chloride flush  3 mL Intravenous Q12H  . ticagrelor  90 mg Oral BID   Continuous Infusions: . sodium chloride    . heparin     PRN Meds:.sodium chloride, acetaminophen, morphine injection, ondansetron (ZOFRAN) IV, sodium chloride flush    ASSESSMENT: 82 yo M w hx of arthritis s/p R knee arthroplasty, asbestosis presented to the ED by EMS with chest pain, found to have inferolateral STEMI s/p DES to Om2. IABP placed for hypotension/elevated LVEDP/shock and continued chest pain.   PLAN/DISCUSSION:   #STEMI s/p DES x1 to OM2 #cardiogenic shock - patient with residual chest pain and STE after PCI.  Cont IABP support for chest pain and shock.  Pharmacologic treatment of chest pain limited given soft BP - Asprin 81 Qd, ticagrelor 90 BID, atorvastatin 80 QD - finish bivalirudin infusion and transition to heparin gtt for IABP - hold BB and ACEI in setting of hypotension  - TTE in AM - wean IABP as chest pain and BP allows - cardiac rehab ordered - Lipid profile and A1c ordered  #AKI - last SCr 0.8-0.9 in 2015 - likely secondary to poor perfusion in setting of AMI/shock - receiving temporary course of gentle fluids after cath (LVEDP 21) - mgmt as per above   #FEN - gentle fluids for few hours post cath - replete lytes as needed - PPI for GI prophy in setting of DAPT - CLD, ADAT   Jeani Sow, MD Cardiology  I have examined the patient and reviewed assessment and plan and discussed with patient.  Agree with above as stated.  I reviewed the ECG and made the decision to perform cardiac cath.  Due to hypotension, IABP was placed after PCI.  Hopefully, the IABP can be weaned quickly as his BP resolves.  Monitor Cr given baseline renal dysfunction.  Monitor in ICU for now.   Lance Muss

## 2016-02-02 NOTE — ED Notes (Signed)
Upon EMS arrival found patient to be pale, diaphoretic.  Fire stated he was vomiting upon there arrival

## 2016-02-02 NOTE — ED Notes (Signed)
EMS adm ASA 324mg  enroute

## 2016-02-03 ENCOUNTER — Encounter (HOSPITAL_COMMUNITY): Payer: Self-pay | Admitting: Interventional Cardiology

## 2016-02-03 ENCOUNTER — Inpatient Hospital Stay (HOSPITAL_COMMUNITY): Payer: Medicare HMO

## 2016-02-03 DIAGNOSIS — I959 Hypotension, unspecified: Secondary | ICD-10-CM

## 2016-02-03 DIAGNOSIS — I2510 Atherosclerotic heart disease of native coronary artery without angina pectoris: Secondary | ICD-10-CM

## 2016-02-03 DIAGNOSIS — I219 Acute myocardial infarction, unspecified: Secondary | ICD-10-CM

## 2016-02-03 LAB — BASIC METABOLIC PANEL
ANION GAP: 10 (ref 5–15)
Anion gap: 9 (ref 5–15)
BUN: 35 mg/dL — ABNORMAL HIGH (ref 6–20)
BUN: 40 mg/dL — AB (ref 6–20)
CALCIUM: 8.4 mg/dL — AB (ref 8.9–10.3)
CHLORIDE: 103 mmol/L (ref 101–111)
CO2: 20 mmol/L — AB (ref 22–32)
CO2: 22 mmol/L (ref 22–32)
CREATININE: 1.73 mg/dL — AB (ref 0.61–1.24)
Calcium: 8.3 mg/dL — ABNORMAL LOW (ref 8.9–10.3)
Chloride: 103 mmol/L (ref 101–111)
Creatinine, Ser: 1.89 mg/dL — ABNORMAL HIGH (ref 0.61–1.24)
GFR calc Af Amer: 36 mL/min — ABNORMAL LOW (ref >60)
GFR calc Af Amer: 41 mL/min — ABNORMAL LOW (ref >60)
GFR calc non Af Amer: 31 mL/min — ABNORMAL LOW (ref >60)
GFR, EST NON AFRICAN AMERICAN: 35 mL/min — AB (ref >60)
GLUCOSE: 282 mg/dL — AB (ref 65–99)
GLUCOSE: 251 mg/dL — AB (ref 65–99)
POTASSIUM: 6 mmol/L — AB (ref 3.5–5.1)
Potassium: 5.1 mmol/L (ref 3.5–5.1)
Sodium: 133 mmol/L — ABNORMAL LOW (ref 135–145)
Sodium: 134 mmol/L — ABNORMAL LOW (ref 135–145)

## 2016-02-03 LAB — POCT I-STAT, CHEM 8
BUN: 34 mg/dL — AB (ref 6–20)
CALCIUM ION: 1.06 mmol/L — AB (ref 1.15–1.40)
CHLORIDE: 104 mmol/L (ref 101–111)
CREATININE: 1.6 mg/dL — AB (ref 0.61–1.24)
GLUCOSE: 297 mg/dL — AB (ref 65–99)
HCT: 37 % — ABNORMAL LOW (ref 39.0–52.0)
Hemoglobin: 12.6 g/dL — ABNORMAL LOW (ref 13.0–17.0)
POTASSIUM: 4.8 mmol/L (ref 3.5–5.1)
Sodium: 137 mmol/L (ref 135–145)
TCO2: 20 mmol/L (ref 0–100)

## 2016-02-03 LAB — LIPID PANEL
Cholesterol: 131 mg/dL (ref 0–200)
HDL: 44 mg/dL (ref >40)
LDL CALC: 75 mg/dL (ref 0–99)
TRIGLYCERIDES: 61 mg/dL (ref <150)
Total CHOL/HDL Ratio: 3 RATIO
VLDL: 12 mg/dL (ref 0–40)

## 2016-02-03 LAB — BRAIN NATRIURETIC PEPTIDE: B Natriuretic Peptide: 227.8 pg/mL — ABNORMAL HIGH (ref 0.0–100.0)

## 2016-02-03 LAB — CBC
HEMATOCRIT: 38.2 % — AB (ref 39.0–52.0)
HEMOGLOBIN: 12.2 g/dL — AB (ref 13.0–17.0)
MCH: 30.9 pg (ref 26.0–34.0)
MCHC: 31.9 g/dL (ref 30.0–36.0)
MCV: 96.7 fL (ref 78.0–100.0)
Platelets: 210 10*3/uL (ref 150–400)
RBC: 3.95 MIL/uL — ABNORMAL LOW (ref 4.22–5.81)
RDW: 13.7 % (ref 11.5–15.5)
WBC: 10.4 10*3/uL (ref 4.0–10.5)

## 2016-02-03 LAB — MRSA PCR SCREENING: MRSA BY PCR: NEGATIVE

## 2016-02-03 LAB — ECHOCARDIOGRAM COMPLETE
Height: 74 in
Weight: 3516.78 oz

## 2016-02-03 LAB — POCT ACTIVATED CLOTTING TIME: ACTIVATED CLOTTING TIME: 439 s

## 2016-02-03 LAB — TROPONIN I
TROPONIN I: 7.73 ng/mL — AB (ref <0.03)
Troponin I: 11.72 ng/mL (ref <0.03)
Troponin I: 7.94 ng/mL (ref <0.03)

## 2016-02-03 LAB — HEPARIN LEVEL (UNFRACTIONATED)
HEPARIN UNFRACTIONATED: 0.34 [IU]/mL (ref 0.30–0.70)
Heparin Unfractionated: 0.3 IU/mL (ref 0.30–0.70)

## 2016-02-03 MED ORDER — PANTOPRAZOLE SODIUM 40 MG PO TBEC
40.0000 mg | DELAYED_RELEASE_TABLET | Freq: Every day | ORAL | Status: DC
  Administered 2016-02-03 – 2016-02-11 (×9): 40 mg via ORAL
  Filled 2016-02-03 (×9): qty 1

## 2016-02-03 MED ORDER — ONDANSETRON HCL 4 MG/2ML IJ SOLN: 4.0000 mg | Freq: Four times a day (QID) | INTRAMUSCULAR | Status: DC | PRN

## 2016-02-03 MED ORDER — SODIUM CHLORIDE 0.9 % IV SOLN
INTRAVENOUS | Status: DC
  Administered 2016-02-03 – 2016-02-05 (×4): via INTRAVENOUS

## 2016-02-03 MED FILL — Verapamil HCl IV Soln 2.5 MG/ML: INTRAVENOUS | Qty: 2 | Status: AC

## 2016-02-03 NOTE — Progress Notes (Signed)
Pt tolerating 1:2 on IABP since change was made this am. Continues to have intermitted chest pain treated with morphine and tylenol. EF per ECHO 35-40%. Dr. Tresa Endo paged and updated. NS decreased to 20ml/hr and IABP changed to 1:3.

## 2016-02-03 NOTE — Progress Notes (Signed)
Inpatient Diabetes Program Recommendations  AACE/ADA: New Consensus Statement on Inpatient Glycemic Control (2015)  Target Ranges:  Prepandial:   less than 140 mg/dL      Peak postprandial:   less than 180 mg/dL (1-2 hours)      Critically ill patients:  140 - 180 mg/dL   No results found for: Erenest Rasher   Results for EINAR, KRIEG (MRN [id number]) as of 02/03/2016 09:26  Ref. Range 02/02/2016 21:00 02/03/2016 01:34  Glucose Latest Ref Range: 65 - 99 mg/dL 622 (H) 633 (H)   Review of Glycemic Control  Diabetes history: None Outpatient Diabetes medications: None Current orders for Inpatient glycemic control: None  Inpatient Diabetes Program   Recommendations: Noted pending A1C. Please consider Novolog 0-9 units TIDAC sensitive correction due to 2 high blood glucoses.  Thank you,  Kristine Linea, RN, BSN Diabetes Coordinator Inpatient Diabetes Program [phone] (Team Pager)

## 2016-02-03 NOTE — Progress Notes (Signed)
Subjective:  Chest discomfort with deep breathing  Objective:   Vital Signs : Vitals:   02/03/16 0500 02/03/16 0600 02/03/16 0700 02/03/16 0800  BP: 107/73 113/76 126/71 119/73  Pulse: 66 64 66 68  Resp: (!) _0 (!) 21  Temp:    97.1 F (36.2 C)  TempSrc:    Axillary  SpO2: 96% 100% 99% 100%  Weight:      Height:        Intake/Output from previous day:  Intake/Output Summary (Last 24 hours) at 02/03/16 0839 Last data filed at 02/03/16 0800  Gross per 24 hour  Intake              637 ml  Output              300 ml  Net              337 ml    I/O since admission: =57  Wt Readings from Last 3 Encounters:  02/03/16 219 lb 12.8 oz (99.7 kg)  10/03/13 227 lb 8 oz (103.2 kg)  09/20/13 227 lb 8 oz (103.2 kg)    Medications: . aspirin  81 mg Oral Daily  . atorvastatin  80 mg Oral q1800  . pantoprazole  40 mg Oral Daily  . sodium chloride flush  3 mL Intravenous Q12H  . ticagrelor  90 mg Oral BID    . heparin 1,300 Units/hr (02/03/16 0800)    Physical Exam:   General appearance: alert, cooperative and no distress Neck: no adenopathy, no carotid bruit, no JVD, supple, symmetrical, trachea midline and thyroid not enlarged, symmetric, no tenderness/mass/nodules Lungs: no wheezing Heart: regular rate and rhythm; 1/6 sem; ? Soft rub  Abdomen: soft, non-tender; bowel sounds normal; no masses,  no organomegaly Extremities: no edema, redness or tenderness in the calves or thighs Pulses: 2+ and symmetric Skin: Skin color, texture, turgor normal. No rashes or lesions Neurologic: Grossly normal   Rate: 67  Rhythm: normal sinus rhythm   ECG (independently read by me): NSR at 67; with persistent STE;  QTc 409 msec  10/29/17ECG (independently read by me): NSR at 95; STE 2,3,aVF, V4-6 QTc 418 msec  Lab Results:   Recent Labs  02/02/16 2100 02/03/16 0134  NA 136 133*  K 5.1 6.0*  CL 104 103  CO2 24 20*  GLUCOSE 257* 282*  BUN 31* 35*  CREATININE 1.75*  1.89*  CALCIUM 8.7* 8.3*    Hepatic Function Latest Ref Rng & Units 02/02/2016  Total Protein 6.5 - 8.1 g/dL 6.4(L)  Albumin 3.5 - 5.0 g/dL 3.3(L)  AST 15 - 41 U/L 58(H)  ALT 17 - 63 U/L 34  Alk Phosphatase 38 - 126 U/L 44  Total Bilirubin 0.3 - 1.2 mg/dL 0.7     Recent Labs  02/02/16 2100 02/03/16 0134  WBC 8.6 10.4  NEUTROABS 4.8  --   HGB 13.1 12.2*  HCT 40.5 38.2*  MCV 96.0 96.7  PLT 236 210     Recent Labs  02/02/16 2100 02/03/16 0134  TROPONINI 10.84* 11.72*    No results found for: TSH No results for input(s): HGBA1C in the last 72 hours.   Recent Labs  02/02/16 2100  PROT 6.4*  ALBUMIN 3.3*  AST 58*  ALT 34  ALKPHOS 44  BILITOT 0.7    Recent Labs  02/02/16 2100  INR 1.29   BNP (last 3 results)  Recent Labs  02/02/16 2100  BNP 374.8*  ProBNP (last 3 results) No results for input(s): PROBNP in the last 8760 hours.   Lipid Panel     Component Value Date/Time   CHOL 131 02/02/2016 2318   TRIG 61 02/02/2016 2318   HDL 44 02/02/2016 2318   CHOLHDL 3.0 02/02/2016 2318   VLDL 12 02/02/2016 2318   LDLCALC 75 02/02/2016 2318      Imaging:  Dg Chest Port 1 View  Result Date: 02/03/2016 CLINICAL DATA:  Intra-aortic balloon pump. EXAM: PORTABLE CHEST 1 VIEW COMPARISON:  Yesterday FINDINGS: Aortic balloon pump tip is in stable position at the proximal descending segment. Chronic cardiopericardial enlargement. Stable vascular pedicle widening. Low volume chest with interstitial crowding at the bases. Diffuse calcified pleural plaques. No pneumothorax or effusion. IMPRESSION: 1. Stable unremarkable positioning of intra-aortic balloon pump. 2. Stable cardiopericardial enlargement.  No pulmonary edema. 3. Widespread calcified pleural plaques. Electronically Signed   By: Monte Fantasia M.D.   On: 02/03/2016 07:07   Dg Chest Port 1 View  Result Date: 02/02/2016 CLINICAL DATA:  STEMI EXAM: PORTABLE CHEST 1 VIEW COMPARISON:  02/02/2016 at  20:59 FINDINGS: An intra aortic balloon pump is visible at the level of the proximal descending aorta. Unchanged mediastinal contours. Extensive calcified pleural plaque is again evident. No airspace consolidation. No large effusion. No pneumothorax. IMPRESSION: Intra-aortic balloon pump appears satisfactorily positioned. Extensive calcified pleural plaque. Electronically Signed   By: Andreas Newport M.D.   On: 02/02/2016 23:59   Dg Chest Portable 1 View  Result Date: 02/02/2016 CLINICAL DATA:  STEMI EXAM: PORTABLE CHEST 1 VIEW COMPARISON:  None. FINDINGS: There is extensive calcified pleural plaque. No confluent airspace consolidation is evident, but the study is limited due to the pleural changes. No large effusions. No pneumothorax. Hilar, mediastinal and cardiac contours are unremarkable. IMPRESSION: Extensive calcified pleural plaque. No large effusion or confluent airspace consolidation. Electronically Signed   By: Andreas Newport M.D.   On: 02/02/2016 21:25   02/02/16 Emergent Cath/PCIConclusion     Ost RCA lesion, 50 %stenosed. Mild pressure dampening with catheter engagement.  Ost LM lesion, 40 %stenosed.  Ost 1st Diag to 1st Diag lesion, 70 %stenosed.  LV end diastolic pressure is moderately elevated.  There is no aortic valve stenosis.  Mid LAD lesion, 10 %stenosed.  Ost 2nd Mrg to 2nd Mrg lesion, 100 %stenosed. A STENT SYNERGY DES 2.25X16 drug eluting stent was successfully placed.  Post intervention, there is a 0% residual stenosis.  Lat 2nd Mrg lesion, 80 %stenosed. The lesion is at the origin of a branch at the bifurcation distal to the stent.   Continue dual antiplatelet therapy for ideally a year. He'll need aggressive secondary prevention including lipid-lowering therapy.  Patient with hypotension in the Cath Lab. Balloon pump placed given that he has some renal insufficiency and LVEDP was already mildly elevated. Her options were limited. I did not want to  start pressors if they could be avoided given that his heart rate is on the higher and and he already has renal insufficiency. Hopefully, the balloon pump can be removed tomorrow if his pressure has stabilized.  Will obtain echocardiogram to evaluate left ventricular function.    The patient denied any prior symptoms of angina before the past 2 days. Therefore, would not plan any routine elective angioplasty on this patient of his other moderate disease.  Continue aggressive medical therapy.      Assessment/Plan:   Active Problems:   Acute MI, inferolateral wall, initial episode of care ([place])  Arterial hypotension   Acute inferolateral myocardial infarction ([place])  1. STEMI: Day 1 s/p PCI to OM2 occlusion Rx with DES stent.  By history in speaking with son and wife, ? If symptoms started on Thursday, with stuttering symptoms 3-4 days before presentation which may explain persistent STE despite reperfusion yesterday. Troponins elevated at 10.8 and 11.2.  2. Possible post MI pericarditis with post MI pleuritic chest pain.:  For echo today   3. Hypotension requiring IABP.  Pt received only 100 cc/hr post cath;  On 1:1 now. Augmented pressure was in the 90's.  I have resumed fluids at 75 cc/hr and reduced rate to 1:2;  Augmented BP now in the 100 - 105 range.  Will check BNP.  Wean as BP allows, hopefully later today.   4. Renal insufficiency:  Cr increased to 1.89 today from 1.75;  Continue fluids post contrast.   5. Elevated Glucose; no h/o DM; probably stress mediated; HbA1c sent  6. Elevated K: recheck this am  Time spent 40 minutes  Troy Sine, MD, [facility] 02/03/2016, 8:39 AM

## 2016-02-03 NOTE — Progress Notes (Signed)
ANTICOAGULATION CONSULT NOTE  Pharmacy Consult for Heparin Indication: IABP  No Known Allergies  Patient Measurements: Height: 6\' 2"  (188 cm) Weight: 219 lb 12.8 oz (99.7 kg) IBW/kg (Calculated) : 82.2  Vital Signs: Temp: 97.1 F (36.2 C) (10/30 0800) Temp Source: Axillary (10/30 0800) BP: 124/87 (10/30 0908) Pulse Rate: 66 (10/30 0908)  Labs:  Recent Labs  02/02/16 2100 02/03/16 0134 02/03/16 0740  HGB 13.1 12.2*  --   HCT 40.5 38.2*  --   PLT 236 210  --   APTT 81*  --   --   LABPROT 16.2*  --   --   INR 1.29  --   --   HEPARINUNFRC  --   --  0.34  CREATININE 1.75* 1.89*  --   CKTOTAL 198  --   --   CKMB 4.8  --   --   TROPONINI 10.84* 11.72*  --     Estimated Creatinine Clearance: 38 mL/min (by C-G formula based on SCr of 1.89 mg/dL (H)).   Medical History: Past Medical History:  Diagnosis Date  . Arthritis   . Asbestosis (HCC)    "no brething problems" pt states worked in [employer] for many yrs and was exposed to asbestosis  . Hyperlipidemia   . Lumbar spondylosis     Medications:  . sodium chloride    . heparin 1,300 Units/hr (02/03/16 0800)     Assessment: 82 y.o. male admitted s/p STEMI/PCI, now with IABP on IV heparin.  CBC fairly stable, no bleeding or complications noted.  Attempting to wean IABP today.  Goal of Therapy:  Heparin level 0.2-0.5 Monitor platelets by anticoagulation protocol: Yes   Plan:  Continue IV heparin at current rate. Recheck heparin level in 6 hrs to confirm. F/u plans to d/c IABP, stop heparin prior to IABP pull.  Tad Moore, BCPS  Clinical Pharmacist Pager [phone]  02/03/2016 10:09 AM

## 2016-02-03 NOTE — Progress Notes (Signed)
  Echocardiogram 2D Echocardiogram has been performed.  Danny Oneill 02/03/2016, 12:50 PM

## 2016-02-03 NOTE — Progress Notes (Signed)
ANTICOAGULATION CONSULT NOTE  Pharmacy Consult for Heparin Indication: IABP  No Known Allergies  Patient Measurements: Height: 6\' 2"  (188 cm) Weight: 219 lb 12.8 oz (99.7 kg) IBW/kg (Calculated) : 82.2  Vital Signs: Temp: 98.5 F (36.9 C) (10/30 1600) Temp Source: Oral (10/30 1600) BP: 119/83 (10/30 1900) Pulse Rate: 70 (10/30 1900)  Labs:  Recent Labs  02/02/16 2100 02/02/16 2132 02/03/16 0134 02/03/16 0740 02/03/16 0941 02/03/16 1616  HGB 13.1 12.6* 12.2*  --   --   --   HCT 40.5 37.0* 38.2*  --   --   --   PLT 236  --  210  --   --   --   APTT 81*  --   --   --   --   --   LABPROT 16.2*  --   --   --   --   --   INR 1.29  --   --   --   --   --   HEPARINUNFRC  --   --   --  0.34  --  0.30  CREATININE 1.75* 1.60* 1.89*  --  1.73*  --   CKTOTAL 198  --   --   --   --   --   CKMB 4.8  --   --   --   --   --   TROPONINI 10.84*  --  11.72*  --  7.94* 7.73*    Estimated Creatinine Clearance: 41.5 mL/min (by C-G formula based on SCr of 1.73 mg/dL (H)).   Medical History: Past Medical History:  Diagnosis Date  . Arthritis   . Asbestosis (HCC)    "no brething problems" pt states worked in [employer] for many yrs and was exposed to asbestosis  . Hyperlipidemia   . Lumbar spondylosis     Medications:  . sodium chloride 50 mL/hr at 02/03/16 1546  . heparin 1,300 Units/hr (02/03/16 1756)     Assessment: 82 y.o. male admitted s/p STEMI/PCI, now with IABP on IV heparin.  CBC fairly stable, no bleeding or complications noted.  Attempting to wean IABP today. Heparin drip 1300 uts/hr HL 0.3 at goal.  Goal of Therapy:  Heparin level 0.2-0.5 Monitor platelets by anticoagulation protocol: Yes   Plan:  Continue IV heparin at 1300 uts/hr F/u plans to d/c IABP, stop heparin prior to IABP pull.  Leota Sauers Pharm.D. CPP, BCPS Clinical Pharmacist [phone] 02/03/2016 7:25 PM

## 2016-02-04 DIAGNOSIS — R7989 Other specified abnormal findings of blood chemistry: Secondary | ICD-10-CM

## 2016-02-04 DIAGNOSIS — N289 Disorder of kidney and ureter, unspecified: Secondary | ICD-10-CM

## 2016-02-04 DIAGNOSIS — I9581 Postprocedural hypotension: Secondary | ICD-10-CM

## 2016-02-04 DIAGNOSIS — R0781 Pleurodynia: Secondary | ICD-10-CM

## 2016-02-04 LAB — BASIC METABOLIC PANEL
ANION GAP: 9 (ref 5–15)
BUN: 41 mg/dL — ABNORMAL HIGH (ref 6–20)
CHLORIDE: 102 mmol/L (ref 101–111)
CO2: 24 mmol/L (ref 22–32)
Calcium: 8.5 mg/dL — ABNORMAL LOW (ref 8.9–10.3)
Creatinine, Ser: 1.39 mg/dL — ABNORMAL HIGH (ref 0.61–1.24)
GFR calc non Af Amer: 46 mL/min — ABNORMAL LOW (ref >60)
GFR, EST AFRICAN AMERICAN: 53 mL/min — AB (ref >60)
GLUCOSE: 161 mg/dL — AB (ref 65–99)
Potassium: 4.9 mmol/L (ref 3.5–5.1)
Sodium: 135 mmol/L (ref 135–145)

## 2016-02-04 LAB — HEPATIC FUNCTION PANEL
ALK PHOS: 39 U/L (ref 38–126)
ALT: 186 U/L — AB (ref 17–63)
AST: 186 U/L — ABNORMAL HIGH (ref 15–41)
Albumin: 3 g/dL — ABNORMAL LOW (ref 3.5–5.0)
BILIRUBIN DIRECT: 0.2 mg/dL (ref 0.1–0.5)
BILIRUBIN INDIRECT: 0.7 mg/dL (ref 0.3–0.9)
BILIRUBIN TOTAL: 0.9 mg/dL (ref 0.3–1.2)
Total Protein: 6 g/dL — ABNORMAL LOW (ref 6.5–8.1)

## 2016-02-04 LAB — CBC
HCT: 39.6 % (ref 39.0–52.0)
HEMOGLOBIN: 12.6 g/dL — AB (ref 13.0–17.0)
MCH: 30.7 pg (ref 26.0–34.0)
MCHC: 31.8 g/dL (ref 30.0–36.0)
MCV: 96.6 fL (ref 78.0–100.0)
Platelets: 206 10*3/uL (ref 150–400)
RBC: 4.1 MIL/uL — AB (ref 4.22–5.81)
RDW: 14 % (ref 11.5–15.5)
WBC: 14.3 10*3/uL — ABNORMAL HIGH (ref 4.0–10.5)

## 2016-02-04 LAB — HEPARIN LEVEL (UNFRACTIONATED): Heparin Unfractionated: 0.21 IU/mL — ABNORMAL LOW (ref 0.30–0.70)

## 2016-02-04 LAB — POCT ACTIVATED CLOTTING TIME: ACTIVATED CLOTTING TIME: 136 s

## 2016-02-04 LAB — HEMOGLOBIN A1C
HEMOGLOBIN A1C: 5.9 % — AB (ref 4.8–5.6)
MEAN PLASMA GLUCOSE: 123 mg/dL

## 2016-02-04 MED ORDER — ATROPINE SULFATE 1 MG/10ML IJ SOSY
PREFILLED_SYRINGE | INTRAMUSCULAR | Status: AC
  Filled 2016-02-04: qty 10

## 2016-02-04 NOTE — Progress Notes (Signed)
Per Dr. Tresa Endo hold tonight's dose of lipitor.

## 2016-02-04 NOTE — Progress Notes (Signed)
IABP aspirated and removed from RFA. Manual pressure applied for 30 minutes. Groin level , tegaderm dressing applied, bedrest instructions given. Bilateral DP pulses present with doppler, no PT pulses heard.  Bedrest begins at 10:45:00

## 2016-02-04 NOTE — Progress Notes (Signed)
Patient EKG shows ST elevation in leads II, Suzzette Righter NP and Dr. Tresa Endo with cardiology @ bedside. EKG strips printed from yesterday and this morning. Patient is chest pain free. Orders received. Per Dr. Tresa Endo turn off heparin @ 0745, check ACT @ 0945 and call cath lab to bull IABP. Will continue to monitor patient closely.

## 2016-02-04 NOTE — Progress Notes (Signed)
ANTICOAGULATION CONSULT NOTE  Pharmacy Consult for Heparin Indication: IABP  No Known Allergies  Patient Measurements: Height: 6\' 2"  (188 cm) Weight: 219 lb 12.8 oz (99.7 kg) IBW/kg (Calculated) : 82.2  Vital Signs: Temp: 98 F (36.7 C) (10/31 0400) Temp Source: Oral (10/31 0400) BP: 110/77 (10/31 0700) Pulse Rate: 92 (10/31 0700)  Labs:  Recent Labs  02/02/16 2100 02/02/16 2132 02/03/16 0134 02/03/16 0740 02/03/16 0941 02/03/16 1616 02/04/16 0214  HGB 13.1 12.6* 12.2*  --   --   --  12.6*  HCT 40.5 37.0* 38.2*  --   --   --  39.6  PLT 236  --  210  --   --   --  206  APTT 81*  --   --   --   --   --   --   LABPROT 16.2*  --   --   --   --   --   --   INR 1.29  --   --   --   --   --   --   HEPARINUNFRC  --   --   --  0.34  --  0.30 0.21*  CREATININE 1.75* 1.60* 1.89*  --  1.73*  --  1.39*  CKTOTAL 198  --   --   --   --   --   --   CKMB 4.8  --   --   --   --   --   --   TROPONINI 10.84*  --  11.72*  --  7.94* 7.73*  --     Estimated Creatinine Clearance: 51.7 mL/min (by C-G formula based on SCr of 1.39 mg/dL (H)).   Medical History: Past Medical History:  Diagnosis Date  . Arthritis   . Asbestosis (HCC)    "no brething problems" pt states worked in [employer] for many yrs and was exposed to asbestosis  . Hyperlipidemia   . Lumbar spondylosis     Medications:  . sodium chloride 50 mL/hr at 02/03/16 1546  . heparin 1,300 Units/hr (02/03/16 1756)     Assessment: 82 y.o. male admitted s/p STEMI/PCI, now with IABP on IV heparin.  CBC fairly stable, no bleeding or complications noted. Planning to d/c IABP today per Cards note. Heparin drip at 1300 uts/hr - Heparin level 0.21 at goal.  Goal of Therapy:  Heparin level 0.2-0.5 Monitor platelets by anticoagulation protocol: Yes   Plan:  Continue IV heparin at 1300 units/h Daily heparin level/CBC Monitor for s/sx bleeding F/u plans to pull IABP (no other indication for heparin once IABP  out)   Babs Bertin, PharmD, BCPS Clinical Pharmacist 02/04/2016 8:01 AM

## 2016-02-04 NOTE — Progress Notes (Signed)
Patient Name: Danny Oneill Date of Encounter: 02/04/2016  [facility]  [facility] Problem [facility]     Active Problems:   Acute MI, inferolateral wall, initial episode of care [facility](HCC)   Arterial hypotension   Acute inferolateral myocardial infarction (HCC)     Subjective   Confused, says he is chest pain free.   Inpatient Medications    Scheduled Meds: . aspirin  81 mg Oral Daily  . atorvastatin  80 mg Oral q1800  . pantoprazole  40 mg Oral Daily  . sodium chloride flush  3 mL Intravenous Q12H  . ticagrelor  90 mg Oral BID   Continuous Infusions: . sodium chloride 50 mL/hr at 02/03/16 1546  . heparin 1,300 Units/hr (02/03/16 1756)   PRN Meds: sodium chloride, acetaminophen, morphine injection, ondansetron (ZOFRAN) IV, ondansetron (ZOFRAN) IV, sodium chloride flush   Vital Signs    Vitals:   02/04/16 0400 02/04/16 0500 02/04/16 0600 02/04/16 0700  BP: (!) 119/92 94/80 122/75 110/77  Pulse: 88 81 89 92  Resp: (!) 25 (!) 21 (!) 22 (!) 26  Temp: 98 F (36.7 C)     TempSrc: Oral     SpO2: 95% 93% 91% 92%  Weight:      Height:        Intake/Output Summary (Last 24 hours) at 02/04/16 0743 Last data filed at 02/04/16 0700  Gross per 24 hour  Intake          2124.92 ml  Output              755 ml  Net          1369.92 ml   Filed Weights   02/02/16 2101 02/03/16 0030  Weight: 220 lb (99.8 kg) 219 lb 12.8 oz (99.7 kg)    Physical Exam   GEN: Well nourished, well developed, elderly male in no acute distress.  HEENT: Grossly normal.  Neck: Supple, no JVD, carotid bruits, or masses. Cardiac: RRR, no murmurs, rubs, or gallops. No clubbing, cyanosis, edema.  Radials/DP/PT 2+ and equal bilaterally.  Respiratory:  Respirations regular and unlabored, clear to auscultation bilaterally. GI: Soft, nontender, nondistended, BS + x 4. MS: no deformity or atrophy. Skin: warm and dry, no rash. Neuro:  Strength and sensation are intact. Psych:  AAOx3.  Normal affect.  Labs    CBC  Recent Labs  02/02/16 2100  02/03/16 0134 02/04/16 0214  WBC 8.6  --  10.4 14.3*  NEUTROABS 4.8  --   --   --   HGB 13.1  < > 12.2* 12.6*  HCT 40.5  < > 38.2* 39.6  MCV 96.0  --  96.7 96.6  PLT 236  --  210 206  < > = values in this interval not displayed. Basic Metabolic Panel  Recent Labs  02/03/16 0941 02/04/16 0214  NA 134* 135  K 5.1 4.9  CL 103 102  CO2 22 24  GLUCOSE 251* 161*  BUN 40* 41*  CREATININE 1.73* 1.39*  CALCIUM 8.4* 8.5*   Liver Function Tests  Recent Labs  02/02/16 2100 02/04/16 0214  AST 58* 186*  ALT 34 186*  ALKPHOS 44 39  BILITOT 0.7 0.9  PROT 6.4* 6.0*  ALBUMIN 3.3* 3.0*  Cardiac Enzymes  Recent Labs  02/02/16 2100 02/03/16 0134 02/03/16 0941 02/03/16 1616  CKTOTAL 198  --   --   --   CKMB 4.8  --   --   --  TROPONINI 10.84* 11.72* 7.94* 7.73*    Hemoglobin A1C  Recent Labs  02/02/16 2318  HGBA1C 5.9*   Fasting Lipid Panel  Recent Labs  02/02/16 2318  CHOL 131  HDL 44  LDLCALC 75  TRIG 61  CHOLHDL 3.0   BNP (last 3 results)  Recent Labs  02/02/16 2100 02/03/16 0941  BNP 374.8* 227.8*    ProBNP (last 3 results) No results for input(s): PROBNP in the last 8760 hours.   Telemetry    NSR, ST elevation  - Personally Reviewed  ECG    NSR, 2mm ST elevation in inferior leads. - Personally Reviewed  Radiology    Dg Chest Port 1 View  Result Date: 02/03/2016 CLINICAL DATA:  Intra-aortic balloon pump. EXAM: PORTABLE CHEST 1 VIEW COMPARISON:  Yesterday FINDINGS: Aortic balloon pump tip is in stable position at the proximal descending segment. Chronic cardiopericardial enlargement. Stable vascular pedicle widening. Low volume chest with interstitial crowding at the bases. Diffuse calcified pleural plaques. No pneumothorax or effusion. IMPRESSION: 1. Stable unremarkable positioning of intra-aortic balloon pump. 2. Stable cardiopericardial enlargement.  No pulmonary  edema. 3. Widespread calcified pleural plaques. Electronically Signed   By: Marnee Spring M.D.   On: 02/03/2016 07:07   Dg Chest Port 1 View  Result Date: 02/02/2016 CLINICAL DATA:  STEMI EXAM: PORTABLE CHEST 1 VIEW COMPARISON:  02/02/2016 at 20:59 FINDINGS: An intra aortic balloon pump is visible at the level of the proximal descending aorta. Unchanged mediastinal contours. Extensive calcified pleural plaque is again evident. No airspace consolidation. No large effusion. No pneumothorax. IMPRESSION: Intra-aortic balloon pump appears satisfactorily positioned. Extensive calcified pleural plaque. Electronically Signed   By: Ellery Plunk M.D.   On: 02/02/2016 23:59   Dg Chest Portable 1 View  Result Date: 02/02/2016 CLINICAL DATA:  STEMI EXAM: PORTABLE CHEST 1 VIEW COMPARISON:  None. FINDINGS: There is extensive calcified pleural plaque. No confluent airspace consolidation is evident, but the study is limited due to the pleural changes. No large effusions. No pneumothorax. Hilar, mediastinal and cardiac contours are unremarkable. IMPRESSION: Extensive calcified pleural plaque. No large effusion or confluent airspace consolidation. Electronically Signed   By: Ellery Plunk M.D.   On: 02/02/2016 21:25    Cardiac Studies  Transthoracic Echocardiography 02/03/16 Study Conclusions  - Left ventricle: Wall thickness was increased in a pattern of   moderate LVH. Systolic function was moderately reduced. The   estimated ejection fraction was in the range of 35% to 40%.   Akinesis of the inferior myocardium. Doppler parameters are   consistent with abnormal left ventricular relaxation (grade 1   diastolic dysfunction). - Aortic valve: There was moderate regurgitation. - Right ventricle: The cavity size was moderately decreased.  Coronary Stent Intervention  IABP Insertion  Left Heart Cath and Coronary Angiography 02/02/16    Ost RCA lesion, 50 %stenosed. Mild pressure dampening with  catheter engagement.  Ost LM lesion, 40 %stenosed.  Ost 1st Diag to 1st Diag lesion, 70 %stenosed.  LV end diastolic pressure is moderately elevated.  There is no aortic valve stenosis.  Mid LAD lesion, 10 %stenosed.  Ost 2nd Mrg to 2nd Mrg lesion, 100 %stenosed. A STENT SYNERGY DES 2.25X16 drug eluting stent was successfully placed.  Post intervention, there is a 0% residual stenosis.  Lat 2nd Mrg lesion, 80 %stenosed. The lesion is at the origin of a branch at the bifurcation distal to the stent.   Continue dual antiplatelet therapy for ideally a year. He'll need aggressive secondary prevention  including lipid-lowering therapy.  Patient with hypotension in the Cath Lab. Balloon pump placed given that he has some renal insufficiency and LVEDP was already mildly elevated. Her options were limited. I did not want to start pressors if they could be avoided given that his heart rate is on the higher and and he already has renal insufficiency. Hopefully, the balloon pump can be removed tomorrow if his pressure has stabilized.  Will obtain echocardiogram to evaluate left ventricular function.    The patient denied any prior symptoms of angina before the past 2 days. Therefore, would not plan any routine elective angioplasty on this patient of his other moderate disease.  Continue aggressive medical therapy.   Patient Profile     82 year old male with a past medical history HLD. Presented on 02/02/16 with STEMI, got DES to OM2. Still with ST elevation in his inferior leads.   Assessment & Plan  1. STEMI: Day 2 s/p PCI to OM2 occlusion Rx with DES stent.  By history in speaking with son and wife, If symptoms started on Thursday, with stuttering symptoms 3-4 days before presentation which may explain persistent STE despite reperfusion. Troponins elevated at 10.8 and 11.2.  2. Possible post MI pericarditis with post MI pleuritic chest pain.: Echo without any pericarditis.   3.  Hypotension requiring IABP: 1:3 now, will discontinue today.  4. Renal insufficiency: Scr improving.   5. Elevated Glucose; no h/o DM; probably stress mediated; HbA1c sent  6. Hyperkalemia: resolved.    Signed, Little Ishikawa, NP  02/04/2016, 7:43 AM   Patient seen and examined. Agree with assessment and plan. Clinically he is improved. No chest tightness but with short lived sharp pleuritic discomfort if he takes a deep breath. Persistent ST elevation probably consistent with seveal days duration of symptoms prior to cath. No pericardial effusion of echo; EF 35 - 40% with inferior wall motion abnormality.  BNP mildly elevated but improved at 227 today.Troponin remains elevated but improved to 7.73 today.  Cr improved with hydration and improved BP. IABP was weaned down to 1:3 yesterday. Today BP augmenting to 110-115. Heparin turned off; will pull IABP this am post ACT check.  LFTs elevated probably from MI; will hold atorvastatin today.   Lennette Bihari, MD, [facility] 02/04/2016 8:12 AM

## 2016-02-04 NOTE — Progress Notes (Signed)
EKG CRITICAL VALUE     12 lead EKG performed.  Critical value noted.Martie Lee, RN notified.   Deitra Mayo, CCT 02/04/2016 6:53 AM

## 2016-02-05 ENCOUNTER — Inpatient Hospital Stay (HOSPITAL_COMMUNITY): Payer: Medicare HMO

## 2016-02-05 DIAGNOSIS — D72828 Other elevated white blood cell count: Secondary | ICD-10-CM

## 2016-02-05 LAB — TROPONIN I: TROPONIN I: 4.35 ng/mL — AB (ref <0.03)

## 2016-02-05 LAB — BASIC METABOLIC PANEL
Anion gap: 9 (ref 5–15)
BUN: 42 mg/dL — AB (ref 6–20)
CALCIUM: 8.4 mg/dL — AB (ref 8.9–10.3)
CO2: 22 mmol/L (ref 22–32)
CREATININE: 1.25 mg/dL — AB (ref 0.61–1.24)
Chloride: 103 mmol/L (ref 101–111)
GFR calc Af Amer: >60 mL/min (ref >60)
GFR calc non Af Amer: 52 mL/min — ABNORMAL LOW (ref >60)
GLUCOSE: 125 mg/dL — AB (ref 65–99)
Potassium: 4.2 mmol/L (ref 3.5–5.1)
Sodium: 134 mmol/L — ABNORMAL LOW (ref 135–145)

## 2016-02-05 LAB — CBC
HCT: 35.3 % — ABNORMAL LOW (ref 39.0–52.0)
Hemoglobin: 11.6 g/dL — ABNORMAL LOW (ref 13.0–17.0)
MCH: 30.7 pg (ref 26.0–34.0)
MCHC: 32.9 g/dL (ref 30.0–36.0)
MCV: 93.4 fL (ref 78.0–100.0)
PLATELETS: 229 10*3/uL (ref 150–400)
RBC: 3.78 MIL/uL — ABNORMAL LOW (ref 4.22–5.81)
RDW: 13.6 % (ref 11.5–15.5)
WBC: 11.2 10*3/uL — AB (ref 4.0–10.5)

## 2016-02-05 LAB — HEPATIC FUNCTION PANEL
ALT: 186 U/L — ABNORMAL HIGH (ref 17–63)
AST: 116 U/L — AB (ref 15–41)
Albumin: 2.8 g/dL — ABNORMAL LOW (ref 3.5–5.0)
Alkaline Phosphatase: 37 U/L — ABNORMAL LOW (ref 38–126)
BILIRUBIN DIRECT: 0.3 mg/dL (ref 0.1–0.5)
BILIRUBIN INDIRECT: 0.8 mg/dL (ref 0.3–0.9)
BILIRUBIN TOTAL: 1.1 mg/dL (ref 0.3–1.2)
Total Protein: 5.7 g/dL — ABNORMAL LOW (ref 6.5–8.1)

## 2016-02-05 MED ORDER — CARVEDILOL 3.125 MG PO TABS
3.1250 mg | ORAL_TABLET | Freq: Two times a day (BID) | ORAL | Status: DC
  Administered 2016-02-05 – 2016-02-11 (×13): 3.125 mg via ORAL
  Filled 2016-02-05 (×13): qty 1

## 2016-02-05 MED ORDER — LISINOPRIL 2.5 MG PO TABS
2.5000 mg | ORAL_TABLET | Freq: Every day | ORAL | Status: DC
  Administered 2016-02-05 – 2016-02-11 (×7): 2.5 mg via ORAL
  Filled 2016-02-05 (×7): qty 1

## 2016-02-05 NOTE — Progress Notes (Signed)
Patient Name: Danny Oneill Date of Encounter: 02/05/2016  Primary Cardiologist: New - Dr. [facility] Problem List     Active Problems:   Acute MI, inferolateral wall, initial episode of care [facility])   Arterial hypotension   Acute inferolateral myocardial infarction (HCC)    Subjective   No events overnight. Remains confused. Appears to be improving somewhat. Denies any chest pain today. Does report dyspnea with exertion. Moving from the bed to the chair makes him very SOB.  Inpatient Medications    . aspirin  81 mg Oral Daily  . atorvastatin  80 mg Oral q1800  . carvedilol  3.125 mg Oral BID WC  . lisinopril  2.5 mg Oral Daily  . pantoprazole  40 mg Oral Daily  . sodium chloride flush  3 mL Intravenous Q12H  . ticagrelor  90 mg Oral BID    Vital Signs    Vitals:   02/05/16 0400 02/05/16 0723 02/05/16 0800 02/05/16 0900  BP: 126/78 (!) 142/88 123/78 112/77  Pulse: (!) 54 89    Resp: (!) 28 (!) 26 (!) 35   Temp: 98.8 F (37.1 C) 98.3 F (36.8 C)    TempSrc: Oral Oral    SpO2: 94% 94%    Weight:      Height:        Intake/Output Summary (Last 24 hours) at 02/05/16 1033 Last data filed at 02/05/16 1000  Gross per 24 hour  Intake          2332.67 ml  Output              750 ml  Net          1582.67 ml   Filed Weights   02/02/16 2101 02/03/16 0030  Weight: 220 lb (99.8 kg) 219 lb 12.8 oz (99.7 kg)    Physical Exam   GEN: Well nourished, well developed, in no acute distress.  HEENT: Grossly normal.  Neck: Supple, no JVD, carotid bruits, or masses. Cardiac: RRR, no murmurs, rubs, or gallops. No clubbing, cyanosis. Trace pedal edema.  Radials/DP/PT 2+ and equal bilaterally.  Respiratory:  Respirations regular and unlabored, bibasilar crackles present. GI: Soft, nontender, nondistended, BS + x 4. MS: no deformity or atrophy. Skin: warm and dry, no rash.  Neuro:  Strength and sensation are intact. Psych: AAOx3.  Normal affect.  Labs     CBC  Recent Labs  02/02/16 2100  02/03/16 0134 02/04/16 0214  WBC 8.6  --  10.4 14.3*  NEUTROABS 4.8  --   --   --   HGB 13.1  < > 12.2* 12.6*  HCT 40.5  < > 38.2* 39.6  MCV 96.0  --  96.7 96.6  PLT 236  --  210 206  < > = values in this interval not displayed.  Basic Metabolic Panel  Recent Labs  02/04/16 0214 02/05/16 0236  NA 135 134*  K 4.9 4.2  CL 102 103  CO2 24 22  GLUCOSE 161* 125*  BUN 41* 42*  CREATININE 1.39* 1.25*  CALCIUM 8.5* 8.4*   Liver Function Tests  Recent Labs  02/04/16 0214 02/05/16 0236  AST 186* 116*  ALT 186* 186*  ALKPHOS 39 37*  BILITOT 0.9 1.1  PROT 6.0* 5.7*  ALBUMIN 3.0* 2.8*   Cardiac Enzymes  Recent Labs  02/02/16 2100 02/03/16 0134 02/03/16 0941 02/03/16 1616  CKTOTAL 198  --   --   --   CKMB 4.8  --   --   --  TROPONINI 10.84* 11.72* 7.94* 7.73*   Hemoglobin A1C  Recent Labs  02/02/16 2318  HGBA1C 5.9*   Fasting Lipid Panel  Recent Labs  02/02/16 2318  CHOL 131  HDL 44  LDLCALC 75  TRIG 61  CHOLHDL 3.0   Telemetry    NSR, ST elevation    ECG   Persistent ST elevation in inferolateral leads  Radiology    Transthoracic Echocardiography 02/03/16 Study Conclusions  - Left ventricle: Wall thickness was increased in a pattern of moderate LVH. Systolic function was moderately reduced. The estimated ejection fraction was in the range of 35% to 40%. Akinesis of the inferior myocardium. Doppler parameters are consistent with abnormal left ventricular relaxation (grade 1 diastolic dysfunction). - Aortic valve: There was moderate regurgitation. - Right ventricle: The cavity size was moderately decreased.  Coronary Stent Intervention  IABP Insertion  Left Heart Cath and Coronary Angiography 02/02/16    Ost RCA lesion, 50 %stenosed. Mild pressure dampening with catheter engagement.  Ost LM lesion, 40 %stenosed.  Ost 1st Diag to 1st Diag lesion, 70 %stenosed.  LV end diastolic  pressure is moderately elevated.  There is no aortic valve stenosis.  Mid LAD lesion, 10 %stenosed.  Ost 2nd Mrg to 2nd Mrg lesion, 100 %stenosed. A STENT SYNERGY DES 2.25X16 drug eluting stent was successfully placed.  Post intervention, there is a 0% residual stenosis.  Lat 2nd Mrg lesion, 80 %stenosed. The lesion is at the origin of a branch at the bifurcation distal to the stent.  Continue dual antiplatelet therapy for ideally a year. He'll need aggressive secondary prevention including lipid-lowering therapy.  Patient with hypotension in the Cath Lab. Balloon pump placed given that he has some renal insufficiency and LVEDP was already mildly elevated. Her options were limited. I did not want to start pressors if they could be avoided given that his heart rate is on the higher and and he already has renal insufficiency. Hopefully, the balloon pump can be removed tomorrow if his pressure has stabilized. Will obtain echocardiogram to evaluate left ventricular function.   The patient denied any prior symptoms of angina before the past 2 days. Therefore, would not plan any routine elective angioplasty on this patient of his other moderate disease. Continue aggressive medical therapy.   Patient Profile     82 year old male with a past medical history HLD. Presented on 02/02/16 with STEMI, got DES to OM2. Still with ST elevation in his inferior leads.   Assessment & Plan    1. STEMI: Day 3 s/p PCI to OM2 occlusion Rx with DES stent. Troponins peaked at 11.7 on presentation and trended down to 7.7 on 10/30. EKG shows persistent ST elevation. Per history from family, patient's symptoms began several days prior to presentation which likely explains the persistent elevation despite reperfusion. ECHO done 10/30 shows EF 35-40% with inferior wall motion abnormality. LFTs remain elevated. Continue to hold Atorvastatin.  -Repeat Troponin today to ensure continued resolution  -Start Coreg  3.125 mg bid and lisinopril 2.5 mg daily -PT -Transfer to step down unit  2. Possible post MI pericarditis with post MI pleuritic chest pain.: Echo without any pericarditis.   3. Hypotension requiring IABP: Resolved. IABP stopped yesterday. Pressures remain stable.   4. Renal insufficiency: Scr improving with gentle IVF. Will decrease to 30 mL/hr today. Starting lisinopril today, suspect bump in Cr tomorrow.   5. Fever and leukocytosis: Febrile to 100.4 overnight. WBC 14 yesterday. Possibly stress reaction. No other systemic signs  of infection. Will check CBC today and monitor.   5. Elevated Glucose; no h/o DM; HgbA1c 5.9. Pre-diabetic range.   6. Hyperkalemia: Resolved.   SignedValentino Nose, Nathan Boswell PGY2 IM Resident [phone] 02/05/2016, 10:33 AM    Patient seen and examined. Agree with assessment and plan.Clinically patient feels better today. BP stable off IABP. Will start low dose ACE-I with lisinopril at 2.5 mg and coreg 3.125 mg bis initially. Persistent STE slightly improved. Atorvastatin on hold with elevated LFTs with LFT increase most likely due to MI/hypotension. Will transfer to stepdown today.   Lennette Biharihomas A. Kelly, MD, [facility] 02/05/2016 4:11 PM

## 2016-02-05 NOTE — Progress Notes (Signed)
Attempted to ambulate with pt before lunch however he would not agree, insistent that he was not walking. We, or his family, could not coerce him. Returned this afternoon to attempt again however now he is sleeping soundly and family asked that we not wake him since he has not been resting well. Will f/u tomorrow. It appears that pt would benefit from PT c/s as he is noted to be unsteady standing. We will also need to have d/c plan.  [id number] Ethelda Chick CES, ACSM 2:07 PM 02/05/2016

## 2016-02-05 NOTE — Progress Notes (Signed)
EKG CRITICAL VALUE     12 lead EKG performed.  Critical value noted. Selena Lesser, RN notified.   Wandalee Ferdinand, [place] 02/05/2016 12:18 PM

## 2016-02-06 LAB — COMPREHENSIVE METABOLIC PANEL
ALK PHOS: 38 U/L (ref 38–126)
ALT: 131 U/L — ABNORMAL HIGH (ref 17–63)
ANION GAP: 7 (ref 5–15)
AST: 64 U/L — ABNORMAL HIGH (ref 15–41)
Albumin: 2.5 g/dL — ABNORMAL LOW (ref 3.5–5.0)
BILIRUBIN TOTAL: 1 mg/dL (ref 0.3–1.2)
BUN: 51 mg/dL — ABNORMAL HIGH (ref 6–20)
CALCIUM: 7.9 mg/dL — AB (ref 8.9–10.3)
CO2: 24 mmol/L (ref 22–32)
Chloride: 104 mmol/L (ref 101–111)
Creatinine, Ser: 1.4 mg/dL — ABNORMAL HIGH (ref 0.61–1.24)
GFR, EST AFRICAN AMERICAN: 52 mL/min — AB (ref >60)
GFR, EST NON AFRICAN AMERICAN: 45 mL/min — AB (ref >60)
GLUCOSE: 106 mg/dL — AB (ref 65–99)
POTASSIUM: 4 mmol/L (ref 3.5–5.1)
Sodium: 135 mmol/L (ref 135–145)
TOTAL PROTEIN: 5.5 g/dL — AB (ref 6.5–8.1)

## 2016-02-06 MED ORDER — ENOXAPARIN SODIUM 40 MG/0.4ML ~~LOC~~ SOLN
40.0000 mg | SUBCUTANEOUS | Status: DC
  Administered 2016-02-06 – 2016-02-10 (×5): 40 mg via SUBCUTANEOUS
  Filled 2016-02-06 (×4): qty 0.4

## 2016-02-06 NOTE — Progress Notes (Signed)
CARDIAC REHAB PHASE I   PRE:  Rate/Rhythm: 65 SR  BP:  Supine:   Sitting: 100/61  Standing:    SaO2: 97%RA  MODE:  Ambulation: 170 ft   POST:  Rate/Rhythm: 70 SR  BP:  Supine:   Sitting: 103/49  Standing:    SaO2: 99%RA 1410-1432 Pt receptive to walking. Pt walked 170 ft on RA with gait belt use, rolling walker and asst x 2. Gait fairly steady. To recliner after walk with chair alarm. Pt with audible wheezing after walk but sats good on RA.   Luetta Nutting, RN BSN  02/06/2016 2:26 PM

## 2016-02-06 NOTE — Care Management Note (Addendum)
Case Management Note  Patient Details  Name: Danny Oneill MRN: [id number] Date of Birth: [birth year]  Subjective/Objective:     STEMI               Action/Plan: Discharge Planning:  NCM spoke to pt and wife, Danny Oneill at bedside. Wife requesting [facility] for SNF-rehab. Waiting PT recommendations for home. CSW referral for SNF placement. Provided wife with Brilinta 30 day free trial card. Has RW at home. Offered choice for HH/provided [facility] list. Wife requested [facility] or [facility] for [facility]. Contacted pt's pharmacy, [facility] [employer] on [address] and they do have in stock. (had 1 bottle)  S/W INEZ @ [facility] # [phone]   BRILINTA 90 MG BID 60 TAB   COVER- YES  CO-PAY- $ 242.00  ( PATIENT HASN'T MET HIS DEDUCTIBLE  WHEN MET WILL PAY $ 47.00 )  TIER- 3 DRUG  PRIOR APPROVAL- NO  PH[facility]ACY : [facility]   02/07/2016 4949 Received call back from [place] and they cannot accept referral. Contacted [facility] for [facility], they can accept referral. Will need HHPT orders with F2F. Left message for attending.    Expected Discharge Date:              Expected Discharge Plan:  [place]  In-House Referral:  Clinical Social Work  Discharge planning Services  CM Consult  Post Acute Care Choice:  Home Health Choice offered to:  Spouse  DME Arranged:  3-N-1 DME Agency:  [place] Arranged:  PT, Nurse's Aide [place] Agency:     Status of Service:  In process, will continue to follow  If discussed at Long Length of Stay Meetings, dates discussed:    Additional Comments:  Erenest Rasher, RN 02/06/2016, 2:20 PM

## 2016-02-06 NOTE — Progress Notes (Signed)
Patient Name: Danny Oneill Date of Encounter: 02/06/2016  Hospital Problem List     Active Problems:   Acute MI, inferolateral wall, initial episode of care [facility](HCC)   Arterial hypotension   Acute inferolateral myocardial infarction (HCC)     Subjective   No events overnight. Was unable to get cardiac rehab yesterday. He refused in the morning and was sleeping the afternoon and family requested to not wake him up. No complaints this morning. No chest pain. No shortness of breath.   Inpatient Medications    . aspirin  81 mg Oral Daily  . atorvastatin  80 mg Oral q1800  . carvedilol  3.125 mg Oral BID WC  . lisinopril  2.5 mg Oral Daily  . pantoprazole  40 mg Oral Daily  . sodium chloride flush  3 mL Intravenous Q12H  . ticagrelor  90 mg Oral BID    Vital Signs    Vitals:   02/05/16 1715 02/05/16 1954 02/05/16 2349 02/06/16 0351  BP: 95/61 (!) 87/55  (!) 89/61  Pulse: 74 71  66  Resp:  (!) 23 19 20   Temp: 98.5 F (36.9 C) 97.8 F (36.6 C) 98.5 F (36.9 C) 98.6 F (37 C)  TempSrc: Oral Oral Oral Oral  SpO2: 96% 95%  92%  Weight:      Height:        Intake/Output Summary (Last 24 hours) at 02/06/16 0752 Last data filed at 02/06/16 0600  Gross per 24 hour  Intake          1847.67 ml  Output              750 ml  Net          1097.67 ml   Filed Weights   02/02/16 2101 02/03/16 0030  Weight: 220 lb (99.8 kg) 219 lb 12.8 oz (99.7 kg)    Physical Exam   GEN: Well nourished, well developed, in no acute distress.  HEENT: Grossly normal.  Neck: Supple, no JVD, carotid bruits, or masses. Cardiac: RRR, no murmurs, rubs, or gallops. No clubbing, cyanosis. Trace pedal edema.  Radials/DP/PT 2+ and equal bilaterally.  Respiratory:  Respirations regular and unlabored, scattered faint end expiratory wheezing GI: Soft, nontender, nondistended, BS + x 4. MS: no deformity or atrophy. Skin: warm and dry, no rash.  Neuro:  Strength and sensation are intact. Psych: AAO to  person and time only. Not place.  Normal affect.  Labs    CBC  Recent Labs  02/04/16 0214 02/05/16 1024  WBC 14.3* 11.2*  HGB 12.6* 11.6*  HCT 39.6 35.3*  MCV 96.6 93.4  PLT 206 229   Basic Metabolic Panel  Recent Labs  02/05/16 0236 02/06/16 0303  NA 134* 135  K 4.2 4.0  CL 103 104  CO2 22 24  GLUCOSE 125* 106*  BUN 42* 51*  CREATININE 1.25* 1.40*  CALCIUM 8.4* 7.9*   Liver Function Tests  Recent Labs  02/05/16 0236 02/06/16 0303  AST 116* 64*  ALT 186* 131*  ALKPHOS 37* 38  BILITOT 1.1 1.0  PROT 5.7* 5.5*  ALBUMIN 2.8* 2.5*   Cardiac Enzymes  Recent Labs  02/03/16 0941 02/03/16 1616 02/05/16 1024  TROPONINI 7.94* 7.73* 4.35*    Telemetry    NSR  ECG    Persistent ST elevations, some improvement in lateral leads  Radiology    Dg Chest Port 1 View  Result Date: 02/05/2016 CLINICAL DATA:  Shortness of breath, history of asbestosis  EXAM: PORTABLE CHEST 1 VIEW COMPARISON:  02/03/2016 FINDINGS: Cardiomediastinal silhouette is stable. Bilateral calcified pleural plaques are again noted. There is left basilar atelectasis or infiltrate. Atherosclerotic calcifications of thoracic aorta. No convincing pulmonary edema pre IMPRESSION: Bilateral calcified pleural plaques again noted. Left basilar atelectasis or infiltrate. No pulmonary edema. Electronically Signed   By: Natasha Mead M.D.   On: 02/05/2016 10:37    Patient Profile     82 year old male with a past medical history HLD. Presented on 02/02/16 with STEMI, got DES to OM2. Still with ST elevation in his inferior leads.   Assessment & Plan    1. STEMI:Day 4s/p PCI to OM2 occlusion Rx with DES stent. Troponins peaked at 11.7 on presentation and trended down to 4.35 on 11/1. EKG shows persistent ST elevation. Per history from family, patient's symptoms began several days prior to presentation which likely explains the persistent elevation despite reperfusion. ECHO done 10/30 shows EF 35-40% with  inferior wall motion abnormality. LFTs remain elevated but improving. Continue to hold Atorvastatin today. If LFTs continue to improve, can consider starting statin therapy. Had some complaints of congestion. Nursing staff concerned about wheezing. He has some upper airway wheezes that resolved with coughing. On exam, he had some faint scattered end expiratory wheezing but minimal. Did start Coreg yesterday that may contribute to some pulmonary symptoms. Respiratory status is good. Will continue Coreg today. If wheezing worsens, can consider changing to metoprolol.  -Continue Coreg 3.125 mg bid and lisinopril 2.5 mg daily. Space out dosing to avoid hypotension.  -PT -Transfer to step down unit  2. Possible post MI pericarditis with post MI pleuritic chest pain.: Echo without any pericarditis.   3. Hypotension requiring IABP: Resolved. IABP stopped 10/31. Pressures were soft overnight. Improved this morning. Will space out his lisinopril and coreg doses today.   4. Renal insufficiency: bump in Cr today after starting lisinopril yesterday. Expected rise after starting ACE-I. Will repeat bmet tomorrow.   5. Fever and leukocytosis: No further fevers. CBC 14 >11. Likely stress response. Low suspicion for infectious etiology currently.   5. Elevated Glucose;no h/o DM; HgbA1c 5.9. Pre-diabetic range.   6. Hyperkalemia: Resolved.   Adria Dill PGY2 IM Resident [phone] 02/06/2016, 7:52 AM    Patient seen and examined. Agree with assessment and plan. Day 4 s/p PCI to OM2, but suspect late presentation MI with persistent STE. No recurrent angina. No arrhthmias. STE improved laterally but remain mildly elevated inferiorly. LFTs remain elevated but improving. Continue low dose coreg and lisinopril; f/u renal profile in am.   Lennette Bihari, MD, [facility] 02/06/2016 12:22 PM

## 2016-02-06 NOTE — Evaluation (Signed)
Physical Therapy Evaluation Patient Details Name: Danny Oneill MRN: [id number] DOB: [birth year] Today's Date: 02/06/2016   History of Present Illness  Pt adm with STEMI. Developed hypotension requiring IABP. PMH - rt TKA, arthritis  Clinical Impression  Pt admitted with above diagnosis and presents to PT with functional limitations due to deficits listed below (See PT problem list). Pt needs skilled PT to maximize independence and safety to allow discharge to SNF prior to return home. Pt may be reluctant to go to ST-SNF but feel he could benefit and be able to return home more independently after SNF and have a decr burden of care for his wife. He may ultimately refuse this option and in that case would recommend maximum HH support.     Follow Up Recommendations SNF (Pt may refuse this)    Equipment Recommendations  None recommended by PT    Recommendations for Other Services       Precautions / Restrictions Precautions Precautions: Fall Restrictions Weight Bearing Restrictions: No      Mobility  Bed Mobility               General bed mobility comments: Pt up in chair  Transfers Overall transfer level: Needs assistance Equipment used: Rolling walker (2 wheeled) Transfers: Sit to/from Stand Sit to Stand: Mod assist         General transfer comment: Assist to bring hips up from low recliner  Ambulation/Gait Ambulation/Gait assistance: Min assist Ambulation Distance (Feet): 170 Feet Assistive device: Rolling walker (2 wheeled) Gait Pattern/deviations: Step-through pattern;Decreased stance time - right;Decreased weight shift to right;Trunk flexed Gait velocity: decr Gait velocity interpretation: <1.8 ft/sec, indicative of risk for recurrent falls General Gait Details: Verbal cues to stand more erect. Assist for balance and safety. Pt with slight limp on RLE due to TKR. Pt with wheezing at end of walk which resolved with a few minutes. SpO2 99% on RA.  Stairs             Wheelchair Mobility    Modified Rankin (Stroke Patients Only)       Balance Overall balance assessment: Needs assistance Sitting-balance support: No upper extremity supported;Feet supported Sitting balance-Leahy Scale: Fair     Standing balance support: Bilateral upper extremity supported Standing balance-Leahy Scale: Poor Standing balance comment: walker and min A for balance                             Pertinent Vitals/Pain Pain Assessment: No/denies pain    Home Living Family/patient expects to be discharged to:: Private residence Living Arrangements: Spouse/significant other Available Help at Discharge: Family;Available 24 hours/day Type of Home: House Home Access: Stairs to enter Entrance Stairs-Rails: Right Entrance Stairs-Number of Steps: 2 Home Layout: One level Home Equipment: Walker - 2 wheels      Prior Function Level of Independence: Independent               Hand Dominance   Dominant Hand: Right    Extremity/Trunk Assessment   Upper Extremity Assessment: Generalized weakness           Lower Extremity Assessment: Generalized weakness         Communication   Communication: HOH  Cognition Arousal/Alertness: Awake/alert Behavior During Therapy: WFL for tasks assessed/performed Overall Cognitive Status: Impaired/Different from baseline Area of Impairment: Memory;Safety/judgement;Problem solving     Memory: Decreased short-term memory   Safety/Judgement: Decreased awareness of deficits;Decreased awareness of safety   Problem Solving:  Requires verbal cues;Requires tactile cues      General Comments      Exercises     Assessment/Plan    PT Assessment Patient needs continued PT services  PT Problem List Decreased strength;Decreased activity tolerance;Decreased balance;Decreased mobility;Decreased safety awareness          PT Treatment Interventions DME instruction;Gait training;Functional mobility  training;Therapeutic activities;Therapeutic exercise;Balance training;Patient/family education    PT Goals (Current goals can be found in the Care Plan section)  Acute Rehab PT Goals Patient Stated Goal: Go home PT Goal Formulation: With patient/family Time For Goal Achievement: 02/13/16 Potential to Achieve Goals: Good    Frequency Min 3X/week   Barriers to discharge        Co-evaluation               End of Session Equipment Utilized During Treatment: Gait belt Activity Tolerance: Patient limited by fatigue Patient left: in chair;with call bell/phone within reach;with chair alarm set;with family/visitor present Nurse Communication: Mobility status         Time: 1130-1150 PT Time Calculation (min) (ACUTE ONLY): 20 min   Charges:   PT Evaluation $PT Eval Moderate Complexity: 1 Procedure     PT G Codes:        Danny Oneill 02/20/16, 1:41 PM [facility] PT [phone]

## 2016-02-06 NOTE — Progress Notes (Signed)
EKG CRITICAL VALUE     12 lead EKG performed.  Critical value noted.  Crystal, RN notified.   Wandalee Ferdinand, CCT 02/06/2016 8:41 AM

## 2016-02-07 LAB — BASIC METABOLIC PANEL
Anion gap: 7 (ref 5–15)
BUN: 50 mg/dL — ABNORMAL HIGH (ref 6–20)
CALCIUM: 8.2 mg/dL — AB (ref 8.9–10.3)
CHLORIDE: 104 mmol/L (ref 101–111)
CO2: 24 mmol/L (ref 22–32)
Creatinine, Ser: 1.23 mg/dL (ref 0.61–1.24)
GFR calc Af Amer: >60 mL/min (ref >60)
GFR calc non Af Amer: 53 mL/min — ABNORMAL LOW (ref >60)
GLUCOSE: 123 mg/dL — AB (ref 65–99)
POTASSIUM: 3.9 mmol/L (ref 3.5–5.1)
SODIUM: 135 mmol/L (ref 135–145)

## 2016-02-07 LAB — HEPATIC FUNCTION PANEL
ALBUMIN: 2.6 g/dL — AB (ref 3.5–5.0)
ALT: 123 U/L — ABNORMAL HIGH (ref 17–63)
AST: 70 U/L — ABNORMAL HIGH (ref 15–41)
Alkaline Phosphatase: 41 U/L (ref 38–126)
Bilirubin, Direct: 0.3 mg/dL (ref 0.1–0.5)
Indirect Bilirubin: 0.7 mg/dL (ref 0.3–0.9)
TOTAL PROTEIN: 5.5 g/dL — AB (ref 6.5–8.1)
Total Bilirubin: 1 mg/dL (ref 0.3–1.2)

## 2016-02-07 NOTE — Progress Notes (Signed)
CARDIAC REHAB PHASE I   PRE:  Rate/Rhythm: 72 SR    BP: sitting 97/49    SaO2:   MODE:  Ambulation: 350 ft   POST:  Rate/Rhythm: 75 SR    BP: sitting 93/45     SaO2: 95 RA  Pt slow but able to get to EOB with mod assist. Stood fairly well. Steady walking with RW, assist x2 with gait belt for security. No c/o but sts toward end that "he was beginning to feel it". To recliner. Family present and supportive. Discussed MI, stent, Brilinta but he will need reinforcement with wife (not present right now).  [phone]   Harriet Masson CES, ACSM 02/07/2016 2:47 PM

## 2016-02-07 NOTE — Progress Notes (Signed)
Patient Name: Danny LatheJames Kleist Date of Encounter: 02/07/2016  Hospital Problem List     Active Problems:   Acute MI, inferolateral wall, initial episode of care [facility](HCC)   Arterial hypotension   Acute inferolateral myocardial infarction (HCC)   Subjective   No events overnight. No chest pain or shortness of breath today. He worked with cardiac rehab yesterday. Walked 170 ft. Required gait belt and rolling walker. Gait steady. No CP or SOB with exertion. PT is recommending SNF at discharge. Appears to be some disagreement among family members about this but does appear that the wife would be okay with him going to SNF.  Inpatient Medications    . aspirin  81 mg Oral Daily  . atorvastatin  80 mg Oral q1800  . carvedilol  3.125 mg Oral BID WC  . enoxaparin (LOVENOX) injection  40 mg Subcutaneous Q24H  . lisinopril  2.5 mg Oral Daily  . pantoprazole  40 mg Oral Daily  . sodium chloride flush  3 mL Intravenous Q12H  . ticagrelor  90 mg Oral BID    Vital Signs    Vitals:   02/07/16 0011 02/07/16 0013 02/07/16 0424 02/07/16 0751  BP: 92/61  (!) 92/54 105/66  Pulse:  70  69  Resp:   20   Temp:  98 F (36.7 C) 97.9 F (36.6 C) 97.5 F (36.4 C)  TempSrc:  Oral Oral Oral  SpO2:  94% 96% 95%  Weight:      Height:        Intake/Output Summary (Last 24 hours) at 02/07/16 1028 Last data filed at 02/07/16 0900  Gross per 24 hour  Intake              970 ml  Output             1101 ml  Net             -131 ml   Filed Weights   02/02/16 2101 02/03/16 0030  Weight: 220 lb (99.8 kg) 219 lb 12.8 oz (99.7 kg)    Physical Exam   UXL:KGMWGEN:Well nourished, well developed, in no acute distress.  HEENT:Grossly normal.  Neck:Supple, no JVD, carotid bruits, or masses. Cardiac:RRR, no murmurs, rubs, or gallops. No clubbing, cyanosis. Trace pedaledema.Radials/DP/PT 2+ and equal bilaterally.  Respiratory:Respirations regular and unlabored, scattered faint end expiratory wheezing -  cleared with coughing NU:UVOZGI:Soft, nontender, nondistended, BS + x 4. MS:no deformity or atrophy. Skin:warm and dry, no rash.  Neuro:Strength and sensation are intact. Psych:AAO to person and time only. Not place. Normal affect.  Labs    CBC  Recent Labs  02/05/16 1024  WBC 11.2*  HGB 11.6*  HCT 35.3*  MCV 93.4  PLT 229   Basic Metabolic Panel  Recent Labs  02/06/16 0303 02/07/16 0217  NA 135 135  K 4.0 3.9  CL 104 104  CO2 24 24  GLUCOSE 106* 123*  BUN 51* 50*  CREATININE 1.40* 1.23  CALCIUM 7.9* 8.2*   Liver Function Tests  Recent Labs  02/06/16 0303 02/07/16 0840  AST 64* 70*  ALT 131* 123*  ALKPHOS 38 41  BILITOT 1.0 1.0  PROT 5.5* 5.5*  ALBUMIN 2.5* 2.6*   No results for input(s): LIPASE, AMYLASE in the last 72 hours. Cardiac Enzymes  Recent Labs  02/05/16 1024  TROPONINI 4.35*    Telemetry    NSR  ECG    Persistent ST elevations, some improvement in lateral leads  Radiology  No results found.  Patient Profile     82 year old male with a past medical history HLD. Presented on 02/02/16 with STEMI, got DES to OM2. Still with ST elevation in his inferior leads.  Per history from family, patient's symptoms began several days prior to presentation which likely explains the persistent elevation despite reperfusion.  Assessment & Plan     1. STEMI:Day 5s/p PCI to OM2 occlusion Rx with DES stent. Troponins peaked at 11.7 on presentation and trended down to 4.35 on 11/1. EKG shows persistent ST elevation - likely from late presentation. ECHO done 10/30 shows EF 35-40% with inferior wall motion abnormality. Has scattered wheezing that clears with coughing. Suspect upper airway congestion. Currently on Coreg 3.125mg  and lisinopril 2.5 mg daily. Cr improved today after slight bump with starting ACE-I. HR in the 60-70 range. BP has been soft.  -Continue Coreg 3.125 mg bid and lisinopril 2.5 mg daily. Space out dosing to avoid hypotension.    -Continue to work with PT - will likely need SNF at discharge -Transfer to telemetry today  2. Elevated LFTs: LFTs elevated but have had slow improvement. Holding statin therapy. Today, the are stable from yesterday. Will continue to hold Statin therapy until shows resolution.   2. Possible post MI pericarditis with post MI pleuritic chest pain: Echo without any pericarditis.   3. Hypotension requiring IABP:IABP stopped 10/31. Pressures were soft overnight. Will space out his lisinopril and coreg doses.   4. Renal insufficiency: bump in Cr after starting lisinopril which has resolved.  5. Fever and leukocytosis: No further fevers. CBC 14 >11. Likely stress response. Low suspicion for infectious etiology currently.   5. Elevated Glucose;no h/o DM; HgbA1c 5.9. Pre-diabetic range.   6. Hyperkalemia: Resolved.    Adria Dill PGY2 IM Resident [phone] 02/07/2016, 10:28 AM    Patient seen and examined. Agree with assessment and plan. Continues to feel better; no recurrent chest pain, no pleuritic symptoms. BP remains adequate but low, therefore , will not titrate dose presently. Renal function improved. LFTs slightly better, but remain elevated, statin remains on hold.  For transfer to telemetry.  Lennette Bihari, MD, [facility] 02/07/2016 3:49 PM

## 2016-02-07 NOTE — Progress Notes (Signed)
EKG CRITICAL VALUE     12 lead EKG performed.  Critical value noted.Dr. [name], notified.   Deitra Mayo, CCT 02/07/2016 8:57 AM

## 2016-02-07 NOTE — Progress Notes (Signed)
PT Cancellation Note  Patient Details Name: Danny Oneill MRN: [id number] DOB: [birth year]   Cancelled Treatment:     Patient just walked with cardiac rehab using RW 350 ft. At this time will hold PT.    Fabio Asa 02/07/2016, 3:24 PM Charlotte Crumb, PT DPT  [phone]

## 2016-02-07 NOTE — Progress Notes (Signed)
Nurses did bedside report at 1900 and introduced the oncoming nurse to the patient. Floor was clear except for fall risk precaution mats. Pt was pleasant but confused and had no complaints. Pt oriented only to self and place. Pt's son and daughter-in-law came to room at 100. Pt's family used call light to ask nurse if pt had received dinner. Dinner was documented in [name] but nurse offered to give pt another meal if still hungry. Pt requested ice cream but nothing else. Nurse brought pt ice cream and crackers to room and family fed pt.   Pt. Family called out again because the pt was complaining of right arm pain. Nurse completed assessment including flushing right PIV and assessing motor function.  Motor function was normal and equal on both sides. Pt stated he was "moving boxes yesterday" and that is why his arm hurts. Pt has been in the hospital since 10/29. Pt's family concerned that he was "roughed up" during transfer and his arm was hurt due to transfer. Nurse reassured family that to her knowledge this did not occur.   Pt. Family called out again very upset because bed controller for bed functions was broken. Pt call light however, was intact and within pt reach. New bed with intact controller was ordered. Pt transferred onto new bed.   Pt's family also stated that when they came to the room the pink basin and hospital toiletries were on the floor and "the room was in disarray".During bedside rounding the nurse did a room assessment and found it to be organized. Nurses apologized for making the family upset due to the above mentioned complaints. Pt's fallen toiletries were replaced with new ones and set on cart in pt's room.   Other complaints from pt's family include "my dad must've been left alone for hours." Due to hourly rounding policy pt was checked at least once an hr. Family wants management to be aware that transfer of pt from ICU to stepdown was "messy." Family also states "moving him to a  new room has caused him emotional trauma." Pt's family also states that no plan of care updates have been given to him and he has not spoken to the doctor. Nurse gave pt update to pt's family at bedside. Nurse will pass on to day shift that the son would like a call from the MD for an update in am.   All questions and concerns from family were addressed. Pt's family went home for the evening. Pt resting comfortably in new bed. Will continue to monitor.

## 2016-02-07 NOTE — [place] FL2 (Signed)
[place] MEDICAID FL2 LEVEL OF CARE SCREENING TOOL     IDENTIFICATION  Patient Name: Danny Oneill Birthdate: [birth year] Sex: male Admission Date (Current Location): 02/02/2016  [facility] [place]   Facility and Address:  [facility], [address], [place], [place] [address]      Provider Number: [id number]  Attending Physician Name and Address:  Corky CraftsJayadeep S Varanasi, MD  Relative Name and Phone Number:  Doy HutchingBarbara Cahalan, Spouse, [phone]    Current Level of Care: Hospital Recommended Level of Care: Skilled Nursing Facility Prior Approval Number:    Date Approved/Denied:   PASRR Number: [id number]  Discharge Plan: SNF    Current Diagnoses: Patient Active Problem List   Diagnosis Date Noted  . Acute inferolateral myocardial infarction (HCC) 02/02/2016  . Acute MI, inferolateral wall, initial episode of care (HCC)   . Arterial hypotension   . Overweight (BMI 25.0-29.9) 10/04/2013  . Expected blood loss anemia 10/04/2013  . S/P right TKA 10/03/2013    Orientation RESPIRATION BLADDER Height & Weight     Self, Time, Situation, Place  Normal Incontinent Weight: 219 lb 12.8 oz (99.7 kg) Height:  6\' 2"  (188 cm)  BEHAVIORAL SYMPTOMS/MOOD NEUROLOGICAL BOWEL NUTRITION STATUS      Incontinent Diet (Diet Heart Room service appropriate? Yes; Fluid consistency: Thin)  AMBULATORY STATUS COMMUNICATION OF NEEDS Skin   Limited Assist Verbally Normal                       Personal Care Assistance Level of Assistance  Bathing Bathing Assistance: Limited assistance         Functional Limitations Info             SPECIAL CARE FACTORS FREQUENCY  PT (By licensed PT), OT (By licensed OT), Speech therapy     PT Frequency: 5X WK OT Frequency: 5X WK     Speech Therapy Frequency: 5X WK      Contractures Contractures Info: Not present    Additional Factors Info  Code Status, Allergies Code Status Info: Full  Code Allergies Info: Morphine and Related           Current Medications (02/07/2016):  This is the current hospital active medication list Current Facility-Administered Medications  Medication Dose Route Frequency Provider Last Rate Last Dose  . 0.9 %  sodium chloride infusion  250 mL Intravenous PRN Corky CraftsJayadeep S Varanasi, MD      . 0.9 %  sodium chloride infusion   Intravenous Continuous Valentino NoseNathan Boswell, MD 30 mL/hr at 02/06/16 0600    . acetaminophen (TYLENOL) tablet 650 mg  650 mg Oral Q4H PRN Corky CraftsJayadeep S Varanasi, MD   650 mg at 02/03/16 1610  . aspirin chewable tablet 81 mg  81 mg Oral Daily Corky CraftsJayadeep S Varanasi, MD   81 mg at 02/07/16 0839  . atorvastatin (LIPITOR) tablet 80 mg  80 mg Oral q1800 Corky CraftsJayadeep S Varanasi, MD   80 mg at 02/06/16 1851  . carvedilol (COREG) tablet 3.125 mg  3.125 mg Oral BID WC Azalee CourseHao Meng, PA   3.125 mg at 02/07/16 0843  . enoxaparin (LOVENOX) injection 40 mg  40 mg Subcutaneous Q24H Lennette Biharihomas A Kelly, MD   40 mg at 02/07/16 1051  . lisinopril (PRINIVIL,ZESTRIL) tablet 2.5 mg  2.5 mg Oral Daily Valentino NoseNathan Boswell, MD   2.5 mg at 02/07/16 0836  . morphine 2 MG/ML injection 2-3 mg  2-3 mg Intravenous Q2H PRN Romeo AppleStephen J Greene,  MD   3 mg at 02/03/16 1643  . ondansetron (ZOFRAN) injection 4 mg  4 mg Intravenous Q6H PRN Corky Crafts, MD      . ondansetron [facility]) injection 4 mg  4 mg Intravenous Q6H PRN Lennette Bihari, MD      . pantoprazole (PROTONIX) EC tablet 40 mg  40 mg Oral Daily Romeo Apple, MD   40 mg at 02/07/16 0836  . sodium chloride flush (NS) 0.9 % injection 3 mL  3 mL Intravenous Q12H Corky Crafts, MD   3 mL at 02/07/16 0840  . sodium chloride flush (NS) 0.9 % injection 3 mL  3 mL Intravenous PRN Corky Crafts, MD      . ticagrelor [facility]) tablet 90 mg  90 mg Oral BID Corky Crafts, MD   90 mg at 02/07/16 6761     Discharge Medications: Please see discharge summary for a list of discharge medications.  Relevant Imaging  Results:  Relevant Lab Results:   Additional Information    Katy Brickell B, LCSWA

## 2016-02-07 NOTE — Clinical Social Work Note (Signed)
Clinical Social Work Assessment  Patient Details  Name: Danny Oneill MRN: [id number] Date of Birth: [birth year]  Date of referral:  02/07/16               Reason for consult:  Discharge Planning, Facility Placement                Permission sought to share information with:  Facility Art therapist granted to share information::  Yes, Verbal Permission Granted  Name::        Agency::  SNF's for placement  Relationship::  Spouse, Danny Oneill [phone]  Contact Information:     Housing/Transportation Living arrangements for the past 2 months:  Continental of Information:  Patient, Spouse Patient Interpreter Needed:  None Criminal Activity/Legal Involvement Pertinent to Current Situation/Hospitalization:  No - Comment as needed    Significant Relationships:  Spouse, Adult Children, Other(Comment) (grandson) Lives with:  Spouse Do you feel safe going back to the place where you live?  No Need for family participation in patient care:  Yes (Comment)  Care giving concerns:  24 hr supervision/SNF recommended by Physical Therapy.  Social Worker assessment / plan:  CSW met with pt and pts family @ bedside.Pt pleasant, alert and oriented X4.  Pt has been residing in a single family home with spouse in [place] [place], [place]. Clare discussed DC planning and advised SNF is recommended by physical therapy. Pt verbalized to physical therapy that he wasn't interested in SNF placement.  Pt feels optimistic that he will get better before DC. Pt reports that he will think about the SNF option. CSW provided pt/family with list of SNF's. Pt's spouse reports that she would like to see pt go to [facility] and would like to discuss it with pt overnight. CSW will FU with pt/family tomorrow. CSW will continue to monitor pt/family and provide support as needed.   Employment status:  Retired Forensic scientist:  [employer] Medicare/HMO/PPO) PT Recommendations:  [place], [place] / Referral to community resources:     Patient/Family's Response to care:  Pt/family very satisfied with care and CSW's involvement with SNF placement options.   Patient/Family's Understanding of and Emotional Response to Diagnosis, Current Treatment, and Prognosis:  Pt is aware that his mobility is limited and that Rehab would help him get closer to baseline, pt advised CSW that he will consider SNF placement.  Emotional Assessment Appearance:  Appears stated age Attitude/Demeanor/Rapport:   (Appropriate) Affect (typically observed):    Orientation:  Oriented to Self, Oriented to Place, Oriented to  Time, Oriented to Situation Alcohol / Substance use:  Not Applicable Psych involvement (Current and /or in the community):  No (Comment)  Discharge Needs  Concerns to be addressed:  Home Safety Concerns Readmission within the last 30 days:  No Current discharge risk:  Dependent with Mobility Barriers to Discharge:  Continued Medical Work up   Cool, [place], [place] 02/07/2016, 3:50 PM

## 2016-02-08 LAB — BASIC METABOLIC PANEL
Anion gap: 8 (ref 5–15)
BUN: 40 mg/dL — AB (ref 4–21)
BUN: 40 mg/dL — AB (ref 6–20)
CHLORIDE: 102 mmol/L (ref 101–111)
CO2: 24 mmol/L (ref 22–32)
CREATININE: 1.2 mg/dL (ref 0.6–1.3)
CREATININE: 1.21 mg/dL (ref 0.61–1.24)
Calcium: 8 mg/dL — ABNORMAL LOW (ref 8.9–10.3)
GFR calc Af Amer: >60 mL/min (ref >60)
GFR calc non Af Amer: 54 mL/min — ABNORMAL LOW (ref >60)
GLUCOSE: 128 mg/dL — AB (ref 65–99)
Glucose: 128 mg/dL
POTASSIUM: 3.8 mmol/L (ref 3.5–5.1)
SODIUM: 134 mmol/L — AB (ref 137–147)
Sodium: 134 mmol/L — ABNORMAL LOW (ref 135–145)

## 2016-02-08 NOTE — Progress Notes (Signed)
02/08/2016 1315 Received pt to room 2W29 from 2H.  Pt is A&O, no c/o voiced other than being very tired.  Tele moniotr applied and CCMD notified.  Oriented to room, call light and bed.  Call bell in reach, family at bedside. Kathryne Hitch

## 2016-02-08 NOTE — Progress Notes (Signed)
CARDIAC REHAB PHASE I   PRE:  Rate/Rhythm: SR 73  BP:  Supine:   Sitting: 89/73 asym  Standing:    SaO2: 100 RA  MODE:  Ambulation: 150 ft   POST:  Rate/Rhythm: 74  BP:  Supine:   Sitting: 86/65 rck 104/72  Standing:    SaO2: 100 RA  Pt assisted up to ambulate 150 ft, x 2 with rolling walker.  Pt moved slowly and needed assistance with steering.  Pt to chair. Family at bedside. Completed education with pt wife and son. MI booklet. Heart healthy diet, exercise guidelines with supervision. Agreed for contact information to be given to [name] CR Phase II for continue exercise once pt is discharged from SNF.  Pt in chair, call bell in place. Alanson Aly, BSN

## 2016-02-08 NOTE — Progress Notes (Signed)
Patient Name: Danny LatheJames Keady Date of Encounter: 02/08/2016  Hospital Problem List     Active Problems:   Acute MI, inferolateral wall, initial episode of care [facility](HCC)   Arterial hypotension   Acute inferolateral myocardial infarction [facility](HCC)    Patient Profile     82 year old male with a past medical history HLD. Presented on 02/02/16 with STEMI, got DES to OM2. Still with ST elevation in his inferior leads.  Per history from family, patient's symptoms began several days prior to presentation which likely explains the persistent elevation despite reperfusion.   Subjective   He ambulated yesterday and was doing OK.  No chest pain.  Very weak.   Inpatient Medications    . aspirin  81 mg Oral Daily  . carvedilol  3.125 mg Oral BID WC  . enoxaparin (LOVENOX) injection  40 mg Subcutaneous Q24H  . lisinopril  2.5 mg Oral Daily  . pantoprazole  40 mg Oral Daily  . sodium chloride flush  3 mL Intravenous Q12H  . ticagrelor  90 mg Oral BID    Vital Signs    Vitals:   02/08/16 0413 02/08/16 0700 02/08/16 0726 02/08/16 0749  BP: 94/61  93/61 (!) 104/49  Pulse: 67   77  Resp: 20 (!) 22 20 16   Temp: 97.9 F (36.6 C)  97.5 F (36.4 C) 97.9 F (36.6 C)  TempSrc: Oral  Oral Axillary  SpO2: 93% 96% 93% 100%  Weight:      Height:        Intake/Output Summary (Last 24 hours) at 02/08/16 0942 Last data filed at 02/08/16 0731  Gross per 24 hour  Intake              720 ml  Output              400 ml  Net              320 ml   Filed Weights   02/02/16 2101 02/03/16 0030  Weight: 220 lb (99.8 kg) 219 lb 12.8 oz (99.7 kg)    Physical Exam    GEN: Well nourished, well developed, in  no acute distress.  Neck: Supple, no JVD, carotid bruits, or masses. Cardiac: RRR, no rubs, or gallops. No clubbing, cyanosis, no edema.  Radials/DP/PT 2+ and equal bilaterally.  Respiratory:  Respirations  regular and unlabored, clear to auscultation bilaterally. GI: Soft, nontender, nondistended,  BS + x 4. Neuro:  Strength and sensation are intact.   Labs    CBC  Recent Labs  02/05/16 1024  WBC 11.2*  HGB 11.6*  HCT 35.3*  MCV 93.4  PLT 229   Basic Metabolic Panel  Recent Labs  02/07/16 0217 02/08/16 0311  NA 135 134*  K 3.9 3.8  CL 104 102  CO2 24 24  GLUCOSE 123* 128*  BUN 50* 40*  CREATININE 1.23 1.21  CALCIUM 8.2* 8.0*   Liver Function Tests  Recent Labs  02/06/16 0303 02/07/16 0840  AST 64* 70*  ALT 131* 123*  ALKPHOS 38 41  BILITOT 1.0 1.0  PROT 5.5* 5.5*  ALBUMIN 2.5* 2.6*   No results for input(s): LIPASE, AMYLASE in the last 72 hours. Cardiac Enzymes  Recent Labs  02/05/16 1024  TROPONINI 4.35*   BNP Invalid input(s): POCBNP D-Dimer No results for input(s): DDIMER in the last 72 hours. Hemoglobin A1C No results for input(s): HGBA1C in the last 72 hours. Fasting Lipid Panel No results for input(s): CHOL,  HDL, LDLCALC, TRIG, CHOLHDL, LDLDIRECT in the last 72 hours. Thyroid Function Tests No results for input(s): TSH, T4TOTAL, T3FREE, THYROIDAB in the last 72 hours.  Invalid input(s): FREET3  Telemetry    NSR  ECG    NA  Radiology    Dg Chest Port 1 View  Result Date: 02/05/2016 CLINICAL DATA:  Shortness of breath, history of asbestosis EXAM: PORTABLE CHEST 1 VIEW COMPARISON:  02/03/2016 FINDINGS: Cardiomediastinal silhouette is stable. Bilateral calcified pleural plaques are again noted. There is left basilar atelectasis or infiltrate. Atherosclerotic calcifications of thoracic aorta. No convincing pulmonary edema pre IMPRESSION: Bilateral calcified pleural plaques again noted. Left basilar atelectasis or infiltrate. No pulmonary edema. Electronically Signed   By: Natasha Mead M.D.   On: 02/05/2016 10:37   Dg Chest Port 1 View  Result Date: 02/03/2016 CLINICAL DATA:  Intra-aortic balloon pump. EXAM: PORTABLE CHEST 1 VIEW COMPARISON:  Yesterday FINDINGS: Aortic balloon pump tip is in stable position at the proximal  descending segment. Chronic cardiopericardial enlargement. Stable vascular pedicle widening. Low volume chest with interstitial crowding at the bases. Diffuse calcified pleural plaques. No pneumothorax or effusion. IMPRESSION: 1. Stable unremarkable positioning of intra-aortic balloon pump. 2. Stable cardiopericardial enlargement.  No pulmonary edema. 3. Widespread calcified pleural plaques. Electronically Signed   By: Marnee Spring M.D.   On: 02/03/2016 07:07   Dg Chest Port 1 View  Result Date: 02/02/2016 CLINICAL DATA:  STEMI EXAM: PORTABLE CHEST 1 VIEW COMPARISON:  02/02/2016 at 20:59 FINDINGS: An intra aortic balloon pump is visible at the level of the proximal descending aorta. Unchanged mediastinal contours. Extensive calcified pleural plaque is again evident. No airspace consolidation. No large effusion. No pneumothorax. IMPRESSION: Intra-aortic balloon pump appears satisfactorily positioned. Extensive calcified pleural plaque. Electronically Signed   By: Ellery Plunk M.D.   On: 02/02/2016 23:59   Dg Chest Portable 1 View  Result Date: 02/02/2016 CLINICAL DATA:  STEMI EXAM: PORTABLE CHEST 1 VIEW COMPARISON:  None. FINDINGS: There is extensive calcified pleural plaque. No confluent airspace consolidation is evident, but the study is limited due to the pleural changes. No large effusions. No pneumothorax. Hilar, mediastinal and cardiac contours are unremarkable. IMPRESSION: Extensive calcified pleural plaque. No large effusion or confluent airspace consolidation. Electronically Signed   By: Ellery Plunk M.D.   On: 02/02/2016 21:25    Assessment & Plan    ACUTE INFEROLATERAL MI:  DES to circ.    EF 35%.    Blood pressure will not allow med titration.    ELEVATED LFTs:    Holding statin until these improve.   We can check liver enzymes again before discharge.    PLEURITIC CHEST PAIN:    No further pleuritic pain apparently.  (The patient is very hard of hearing.)    CKD:  Creat  is improved.    FEVER/LEUKOCYTOSIS:   Afebrile.    Will repeat CBC in AM  HYPERGLYCEMIA:      BS OK.  Continue current therapy.      Signed, Rollene Rotunda, MD  02/08/2016, 9:42 AM

## 2016-02-08 NOTE — Progress Notes (Signed)
Clinical Social Work  CSW followed up with pt and wife at bedside to discuss SNF options. Pt and wife agree that SNF placement will be needed at DC and agreeable to [facility] search. Wife prefers [place] but is aware that they might not be contracted with pt's insurance. Wife reports that [name] is another option for them as well.  CSW faxed out information and will follow up with bed offers.  Unk Lightning, LCSW Weekend Coverage

## 2016-02-09 LAB — CBC
HCT: 33.8 % — ABNORMAL LOW (ref 39.0–52.0)
Hemoglobin: 10.9 g/dL — ABNORMAL LOW (ref 13.0–17.0)
MCH: 30 pg (ref 26.0–34.0)
MCHC: 32.2 g/dL (ref 30.0–36.0)
MCV: 93.1 fL (ref 78.0–100.0)
PLATELETS: 270 10*3/uL (ref 150–400)
RBC: 3.63 MIL/uL — AB (ref 4.22–5.81)
RDW: 13.8 % (ref 11.5–15.5)
WBC: 8.5 10*3/uL (ref 4.0–10.5)

## 2016-02-09 LAB — CBC AND DIFFERENTIAL
HCT: 34 % — AB (ref 41–53)
HEMOGLOBIN: 10.9 g/dL — AB (ref 13.5–17.5)
Platelets: 270 10*3/uL (ref 150–399)
WBC: 8.5 10*3/mL

## 2016-02-09 NOTE — Progress Notes (Signed)
Patient Name: Danny Oneill Date of Encounter: 02/09/2016  Primary Cardiologist: [name] Problem List     Active Problems:   Acute MI, inferolateral wall, initial episode of care [facility])   Arterial hypotension   Acute inferolateral myocardial infarction [facility])     Subjective   Still feeling quite weak, tired no chest pain. Sitting in chair, quite confused, just woke up. Needs assistance to get to the bathroom.  Inpatient Medications    Scheduled Meds: . aspirin  81 mg Oral Daily  . carvedilol  3.125 mg Oral BID WC  . enoxaparin (LOVENOX) injection  40 mg Subcutaneous Q24H  . lisinopril  2.5 mg Oral Daily  . pantoprazole  40 mg Oral Daily  . sodium chloride flush  3 mL Intravenous Q12H  . ticagrelor  90 mg Oral BID   Continuous Infusions: . sodium chloride Stopped (02/07/16 1900)   PRN Meds: sodium chloride, acetaminophen, morphine injection, ondansetron (ZOFRAN) IV, ondansetron (ZOFRAN) IV, sodium chloride flush   Vital Signs    Vitals:   02/08/16 1821 02/08/16 1900 02/09/16 0501 02/09/16 0828  BP: 99/60 117/71 123/71 (!) 106/58  Pulse: 72 70 66 72  Resp:  20 (!) 22   Temp:  99 F (37.2 C) 98.8 F (37.1 C)   TempSrc:  Oral Oral   SpO2:  99% 97%   Weight:      Height:        Intake/Output Summary (Last 24 hours) at 02/09/16 1111 Last data filed at 02/09/16 0900  Gross per 24 hour  Intake              720 ml  Output              600 ml  Net              120 ml   Filed Weights   02/02/16 2101 02/03/16 0030  Weight: 220 lb (99.8 kg) 219 lb 12.8 oz (99.7 kg)    Physical Exam    GEN: Elderly well developed, in no acute distress.  HEENT: Grossly normal.  Neck: Supple, no JVD, carotid bruits, or masses. Cardiac: RRR, no murmurs, rubs, or gallops. No clubbing, cyanosis, edema.  Radials/DP/PT 2+ and equal bilaterally.  Respiratory:  Respirations regular and unlabored, clear to auscultation bilaterally. GI: Soft, nontender, nondistended, BS + x  4. MS: no deformity or atrophy. Skin: warm and dry, no rash. Neuro:  Strength and sensation are intact. Psych: AAOx3.  Normal affect.  Labs    CBC  Recent Labs  02/09/16 0239  WBC 8.5  HGB 10.9*  HCT 33.8*  MCV 93.1  PLT 270   Basic Metabolic Panel  Recent Labs  02/07/16 0217 02/08/16 0311  NA 135 134*  K 3.9 3.8  CL 104 102  CO2 24 24  GLUCOSE 123* 128*  BUN 50* 40*  CREATININE 1.23 1.21  CALCIUM 8.2* 8.0*   Liver Function Tests  Recent Labs  02/07/16 0840  AST 70*  ALT 123*  ALKPHOS 41  BILITOT 1.0  PROT 5.5*  ALBUMIN 2.6*     Telemetry    Normal sinus rhythm, no adverse arrhythmias - Personally Reviewed  ECG    No new EKG. Prior persistent ST segment elevation.  Radiology    No results found.  Cardiac Studies   Cath 02/02/16:  Ost RCA lesion, 50 %stenosed. Mild pressure dampening with catheter engagement.  Ost LM lesion, 40 %stenosed.  Ost 1st Diag to 1st Diag  lesion, 70 %stenosed.  LV end diastolic pressure is moderately elevated.  There is no aortic valve stenosis.  Mid LAD lesion, 10 %stenosed.  Ost 2nd Mrg to 2nd Mrg lesion, 100 %stenosed. A STENT SYNERGY DES 2.25X16 drug eluting stent was successfully placed.  Post intervention, there is a 0% residual stenosis.  Lat 2nd Mrg lesion, 80 %stenosed. The lesion is at the origin of a branch at the bifurcation distal to the stent.   Continue dual antiplatelet therapy for ideally a year. He'll need aggressive secondary prevention including lipid-lowering therapy.  Patient with hypotension in the Cath Lab. Balloon pump placed given that he has some renal insufficiency and LVEDP was already mildly elevated. Her options were limited. I did not want to start pressors if they could be avoided given that his heart rate is on the higher and and he already has renal insufficiency. Hopefully, the balloon pump can be removed tomorrow if his pressure has stabilized.  Will obtain echocardiogram  to evaluate left ventricular function.    The patient denied any prior symptoms of angina before the past 2 days. Therefore, would not plan any routine elective angioplasty on this patient of his other moderate disease.  Continue aggressive medical therapy.   Patient Profile     82 year old male with a past medical history HLD. Presented on 02/02/16 with STEMI, got DES to OM2. Still with ST elevation in his inferior leads. Per history from family, patient's symptoms began several days prior to presentation which likely explains the persistent elevation despite reperfusion.  Assessment & Plan    Inferolateral STEMI  - Drug-eluting stent to circumflex artery, ejection fraction 35%.  Acute systolic heart failure  - Ischemic cardiomyopathy  - EF 35%.  - Originally intra-aortic balloon pump placed  - Difficult to titrate medications because of hypotension.  Pleuritic chest pain  - Improved  Chronic kidney disease stage III  - Improved creatinine 1.2  Recent fevers/leukocytosis  - Continues to be afebrile  - Resolved leukocytosis, white count now normal. Likely the marginal  in the setting of STEMI  Elevated liver enzymes  - Likely in the setting of MI. Shock. Should improve.   Generalized weakness following MI  - Skilled nursing facility recommended by cardiac rehabilitation.  - He may be ready for discharge tomorrow  Signed, [name], MD  02/09/2016, 11:11 AM

## 2016-02-10 DIAGNOSIS — I213 ST elevation (STEMI) myocardial infarction of unspecified site: Secondary | ICD-10-CM

## 2016-02-10 LAB — URINALYSIS, ROUTINE W REFLEX MICROSCOPIC
Bilirubin Urine: NEGATIVE
Glucose, UA: NEGATIVE mg/dL
Ketones, ur: NEGATIVE mg/dL
Nitrite: NEGATIVE
PROTEIN: NEGATIVE mg/dL
SPECIFIC GRAVITY, URINE: 1.02 (ref 1.005–1.030)
pH: 5.5 (ref 5.0–8.0)

## 2016-02-10 LAB — URINE MICROSCOPIC-ADD ON

## 2016-02-10 MED ORDER — LISINOPRIL 2.5 MG PO TABS: 2.5000 mg | ORAL_TABLET | Freq: Every day | ORAL | 12 refills | Status: DC

## 2016-02-10 MED ORDER — TICAGRELOR 90 MG PO TABS: 90.0000 mg | ORAL_TABLET | Freq: Two times a day (BID) | ORAL | 12 refills | Status: DC

## 2016-02-10 MED ORDER — CARVEDILOL 3.125 MG PO TABS: 3.1250 mg | ORAL_TABLET | Freq: Two times a day (BID) | ORAL | 12 refills | Status: DC

## 2016-02-10 MED ORDER — PANTOPRAZOLE SODIUM 40 MG PO TBEC: 40.0000 mg | DELAYED_RELEASE_TABLET | Freq: Every day | ORAL | 12 refills | Status: DC

## 2016-02-10 NOTE — Clinical Social Work Placement (Addendum)
CLINICAL SOCIAL WORK PLACEMENT  NOTE  **PATIENT WILL DISCHARGE TO [facility] Tuesday - 11/7. CSW contacted by nurse and informed that discharge cancelled as patient bleeding into foley catheter. Facility admissions director notified that d/c cancelled.   Date:  02/10/2016  Patient Details  Name: Danny Oneill MRN: [id number] Date of Birth: [birth year]  Clinical Social Work is seeking post-discharge placement for this patient at the Skilled  Nursing Facility level of care (*CSW will initial, date and re-position this form in  chart as items are completed):  Yes   Patient/family provided with [place] Clinical Social Work Department's list of facilities offering this level of care within the geographic area requested by the patient (or if unable, by the patient's family).  Yes   Patient/family informed of their freedom to choose among providers that offer the needed level of care, that participate in Medicare, Medicaid or managed care program needed by the patient, have an available bed and are willing to accept the patient.  Yes   Patient/family informed of 's ownership interest in [name] and [name], as well as of the fact that they are under no obligation to receive care at these facilities.  PASRR submitted to EDS on       PASRR number received on       Existing PASRR number confirmed on 02/07/16     FL2 transmitted to all facilities in geographic area requested by pt/family on 02/08/16     FL2 transmitted to all facilities within larger geographic area on       Patient informed that his/her managed care company has contracts with or will negotiate with certain facilities, including the following:        Yes   Patient/family informed of bed offers received.  Patient chooses bed at [facility]     Physician recommends and patient chooses bed at      Patient to be transferred to [facility] on 02/11/16.  Patient to  be transferred to facility by Ambulance [facility])     Patient family notified on 02/11/16 of transfer.  Name of family member notified:  Wife Britta Mccreedy, daughter Margaretmary Bayley and son Billey Gosling at the bedside.     PHYSICIAN       Additional Comment:    _______________________________________________ Cristobal Goldmann, LCSW 02/10/2016, 4:45 PM

## 2016-02-10 NOTE — Progress Notes (Addendum)
Called by RN regarding Danny Oneill having pain at foley site and hematuria. Flushed foley with 10cc, noted foley flushes well and has no complications. Patient assessed and denies abdominal pain, nausea and chest pain. Pain is located at the urethra when the foley tube touches the edge of the urethra. Hematuria noted, no clots or frank red bleeding. Likely from irritation from insertion and patient in on Bril[place]. Will hold off on discharge for now and can DC in the am, we will get CBC in the am to make sure he is not anemic from hematuria and will give pain medication for the insertion site pain. Spoke with urology regarding the patient's pain and made him an appt. For next week. According to the RN at the office, patient needs to have foley for at least a week before being seen by Urology.

## 2016-02-10 NOTE — Progress Notes (Signed)
Physical Therapy Treatment Patient Details Name: Danny Oneill MRN: [id number] DOB: 04-May-1931 Today's Date: 02/10/2016    History of Present Illness Pt adm with STEMI. Developed hypotension requiring IABP. PMH - rt TKA, arthritis    PT Comments    Pt with excellent progression with gait distance however continues to struggle with sit to stand transfers from all surfaces. Wife present and unable to assist with transfers. Pt and wife educated for sequence of transfers to increase function as well as HEP and encouraged he perform throughout the day. Continue to recommend SNF as wife unable to physically assist rise from surfaces at home. Will continue to follow.  HR 69-72   Follow Up Recommendations  SNF     Equipment Recommendations  None recommended by PT    Recommendations for Other Services       Precautions / Restrictions Precautions Precautions: Fall    Mobility  Bed Mobility Overal bed mobility: Needs Assistance Bed Mobility: Supine to Sit     Supine to sit: Min assist     General bed mobility comments: cues for sequence with HOB 20 degrees, rail and slight assist to fully elevate trunk   Transfers Overall transfer level: Needs assistance   Transfers: Sit to/from Stand Sit to Stand: Min assist;Mod assist         General transfer comment: from bed with cues for sequence and position able to stand with min assist, from low recliner mod assist with sequential cues. minguard to sit with cues for safety  Ambulation/Gait Ambulation/Gait assistance: Min guard Ambulation Distance (Feet): 450 Feet Assistive device: Rolling walker (2 wheeled) Gait Pattern/deviations: Step-through pattern;Decreased stride length;Trunk flexed   Gait velocity interpretation: Below normal speed for age/gender General Gait Details: cues for posture and looking up. Increased gait tolerance and balance today   Stairs            Wheelchair Mobility    Modified Rankin (Stroke  Patients Only)       Balance Overall balance assessment: Needs assistance   Sitting balance-Leahy Scale: Good       Standing balance-Leahy Scale: Fair                      Cognition Arousal/Alertness: Awake/alert Behavior During Therapy: WFL for tasks assessed/performed Overall Cognitive Status: Within Functional Limits for tasks assessed                      Exercises General Exercises - Lower Extremity Long Arc Quad: AROM;Both;10 reps;Seated Hip Flexion/Marching: AROM;Both;Seated;10 reps    General Comments        Pertinent Vitals/Pain Pain Assessment: No/denies pain    Home Living                      Prior Function            PT Goals (current goals can now be found in the care plan section) Progress towards PT goals: Progressing toward goals    Frequency           PT Plan Current plan remains appropriate    Co-evaluation             End of Session   Activity Tolerance: Patient tolerated treatment well Patient left: in chair;with call bell/phone within reach;with family/visitor present     Time: 0830-0904 PT Time Calculation (min) (ACUTE ONLY): 34 min  Charges:  $Gait Training: 8-22 mins $Therapeutic Exercise: 8-22 mins  G CodesDelorse Lek:      Tabor, Shanay Woolman Beth 02/10/2016, 10:32 AM  Delaney MeigsMaija Tabor Angelicia Lessner, PT [phone]

## 2016-02-10 NOTE — Progress Notes (Signed)
Patient Name: Danny Oneill Date of Encounter: 02/10/2016  Primary Cardiologist: Dr. Rachelle HoraKelly  Hospital Problem List     Active Problems:   Acute MI, inferolateral wall, initial episode of care [facility](HCC)   Arterial hypotension   Acute inferolateral myocardial infarction [facility](HCC)     Subjective   Feels well, denies chest pain. Remains weak.   Inpatient Medications    Scheduled Meds: . aspirin  81 mg Oral Daily  . carvedilol  3.125 mg Oral BID WC  . enoxaparin (LOVENOX) injection  40 mg Subcutaneous Q24H  . lisinopril  2.5 mg Oral Daily  . pantoprazole  40 mg Oral Daily  . sodium chloride flush  3 mL Intravenous Q12H  . ticagrelor  90 mg Oral BID   Continuous Infusions: . sodium chloride Stopped (02/07/16 1900)   PRN Meds: sodium chloride, acetaminophen, morphine injection, ondansetron (ZOFRAN) IV, ondansetron (ZOFRAN) IV, sodium chloride flush   Vital Signs    Vitals:   02/09/16 0828 02/09/16 1305 02/09/16 2040 02/10/16 0548  BP: (!) 106/58 107/69 113/64 122/74  Pulse: 72 69 75 64  Resp:  (!) 22 20 20   Temp:  97.4 F (36.3 C) 99 F (37.2 C) 98.7 F (37.1 C)  TempSrc:  Oral Oral Oral  SpO2:  99% 96% 97%  Weight:      Height:        Intake/Output Summary (Last 24 hours) at 02/10/16 0937 Last data filed at 02/10/16 0503  Gross per 24 hour  Intake                0 ml  Output              950 ml  Net             -950 ml   Filed Weights   02/02/16 2101 02/03/16 0030  Weight: 220 lb (99.8 kg) 219 lb 12.8 oz (99.7 kg)    Physical Exam    GEN: Well nourished, well developed, in no acute distress.  HEENT: Grossly normal.  Neck: Supple, no JVD, carotid bruits, or masses. Cardiac: RRR, no murmurs, rubs, or gallops. No clubbing, cyanosis, edema.  Radials/DP/PT 2+ and equal bilaterally.  Respiratory:  Respirations regular and unlabored, clear to auscultation bilaterally. GI: Soft, nontender, nondistended, BS + x 4. MS: no deformity or atrophy. Skin: warm and dry, no  rash. Neuro:  Strength and sensation are intact. Psych: AAOx3.  Normal affect.  Labs    CBC  Recent Labs  02/09/16 0239  WBC 8.5  HGB 10.9*  HCT 33.8*  MCV 93.1  PLT 270   Basic Metabolic Panel  Recent Labs  02/08/16 0311  NA 134*  K 3.8  CL 102  CO2 24  GLUCOSE 128*  BUN 40*  CREATININE 1.21  CALCIUM 8.0*    Telemetry    NSR, ST elevation- Personally Reviewed  ECG    NSR, no new EKG. Inferior ST elevation persists. - Personally Reviewed  Radiology    No results found.  Cardiac Studies   Cath 02/02/16:  Ost RCA lesion, 50 %stenosed. Mild pressure dampening with catheter engagement.  Ost LM lesion, 40 %stenosed.  Ost 1st Diag to 1st Diag lesion, 70 %stenosed.  LV end diastolic pressure is moderately elevated.  There is no aortic valve stenosis.  Mid LAD lesion, 10 %stenosed.  Ost 2nd Mrg to 2nd Mrg lesion, 100 %stenosed. A STENT SYNERGY DES 2.25X16 drug eluting stent was successfully placed.  Post intervention, there is  a 0% residual stenosis.  Lat 2nd Mrg lesion, 80 %stenosed. The lesion is at the origin of a branch at the bifurcation distal to the stent.  Continue dual antiplatelet therapy for ideally a year. He'll need aggressive secondary prevention including lipid-lowering therapy.  Patient with hypotension in the Cath Lab. Balloon pump placed given that he has some renal insufficiency and LVEDP was already mildly elevated. Her options were limited. I did not want to start pressors if they could be avoided given that his heart rate is on the higher and and he already has renal insufficiency. Hopefully, the balloon pump can be removed tomorrow if his pressure has stabilized. Will obtain echocardiogram to evaluate left ventricular function.   The patient denied any prior symptoms of angina before the past 2 days. Therefore, would not plan any routine elective angioplasty on this patient of his other moderate disease. Continue aggressive  medical therapy   Patient Profile     82 year old male with a past medical history HLD. Presented on 02/02/16 with STEMI, got DES to OM2. Still with ST elevation in his inferior leads. Per history from family, patient's symptoms began several days prior to presentation which likely explains the persistent elevation despite reperfusion.  Assessment & Plan    1. Inferolateral STEMI  - Drug-eluting stent to circumflex artery, ejection fraction 35%. On ASA and Brilinta  2. Acute systolic heart failure  - Ischemic cardiomyopathy  - EF 35%.  - Originally intra-aortic balloon pump placed  - Difficult to titrate medications because of hypotension, On Coreg 3.125 BID, lisinopril 2.5mg .  3. Pleuritic chest pain  - Improved  4. Chronic kidney disease stage III  - Improved creatinine 1.2  5. Recent fevers/leukocytosis  - Continues to be afebrile  - Resolved leukocytosis, white count now normal. Likely the marginal  in the setting of STEMI  6. Elevated liver enzymes  - Likely in the setting of MI. Shock. Should improve.  7. Generalized weakness following MI  - Skilled nursing facility recommended by cardiac rehabilitation.  - Case management to have bed offers for him today. I have called and made him a follow up appt. With our office.   Signed, Little Ishikawa, NP  02/10/2016, 9:37 AM    Attending Note:   The patient was seen and examined.  Agree with assessment and plan as noted above.  Changes made to the above note as needed.  Patient seen and independently examined with Suzzette Righter, NP.   We discussed all aspects of the encounter. I agree with the assessment and plan as stated above.  Pt is doing well from a cardiac standpoint.   No angina, Ambulating well  Has not been able to void. Had a post void residual of 500 cc last night. Nurse reported a PVR of 900 cc today   Will ask urology to assess before he goes to SNF    I have spent a total of 30 minutes with patient  reviewing hospital  notes , telemetry, EKGs, labs and examining patient as well as establishing an assessment and plan that was discussed with the patient. > 50% of time was spent in direct patient care.    Vesta Mixer, Montez Hageman., MD, [facility] 02/10/2016, 12:37 PM [address]  [address] Office [phone] Pager [phone]

## 2016-02-10 NOTE — Discharge Summary (Signed)
Discharge Summary    Patient ID: Danny Oneill,  MRN: [id number], DOB/AGE: 08-21-1931 82 y.o.  Admit date: 02/02/2016 Discharge date: 02/10/2016  Primary Care Provider: ARONSON,RICHARD A Primary Cardiologist: Dr. Tresa Endo   Discharge Diagnoses    Active Problems:   Acute MI, inferolateral wall, initial episode of care [facility])   Arterial hypotension   Acute inferolateral myocardial infarction [facility])   Acute ST elevation myocardial infarction (STEMI) (HCC)   Allergies Allergies  Allergen Reactions  . Morphine And Related Other (See Comments)    Confusion/ hallucinations     Diagnostic Studies/Procedures   Coronary Stent Intervention  IABP Insertion  Left Heart Cath and Coronary Angiography 02/02/16    Ost RCA lesion, 50 %stenosed. Mild pressure dampening with catheter engagement.  Ost LM lesion, 40 %stenosed.  Ost 1st Diag to 1st Diag lesion, 70 %stenosed.  LV end diastolic pressure is moderately elevated.  There is no aortic valve stenosis.  Mid LAD lesion, 10 %stenosed.  Ost 2nd Mrg to 2nd Mrg lesion, 100 %stenosed. A STENT SYNERGY DES 2.25X16 drug eluting stent was successfully placed.  Post intervention, there is a 0% residual stenosis.  Lat 2nd Mrg lesion, 80 %stenosed. The lesion is at the origin of a branch at the bifurcation distal to the stent.   Continue dual antiplatelet therapy for ideally a year. He'll need aggressive secondary prevention including lipid-lowering therapy.  Patient with hypotension in the Cath Lab. Balloon pump placed given that he has some renal insufficiency and LVEDP was already mildly elevated. Her options were limited. I did not want to start pressors if they could be avoided given that his heart rate is on the higher and and he already has renal insufficiency. Hopefully, the balloon pump can be removed tomorrow if his pressure has stabilized.  Will obtain echocardiogram to evaluate left ventricular function.    The patient denied  any prior symptoms of angina before the past 2 days. Therefore, would not plan any routine elective angioplasty on this patient of his other moderate disease.  Continue aggressive medical therapy.   Transthoracic Echocardiography 02/03/16 Study Conclusions  - Left ventricle: Wall thickness was increased in a pattern of   moderate LVH. Systolic function was moderately reduced. The   estimated ejection fraction was in the range of 35% to 40%.   Akinesis of the inferior myocardium. Doppler parameters are   consistent with abnormal left ventricular relaxation (grade 1   diastolic dysfunction). - Aortic valve: There was moderate regurgitation. - Right ventricle: The cavity size was moderately decreased.  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   He had not been feeling well for several days prior and had a low grade fever 3 days prior to admission. He had chest pain intermittently beginning 01/31/16 that was substernal, constant and 9/10 in severity. He denied associated symptoms of SOB, nausea and diaphoresis. He was taken emergently to the cath lab.    Hospital Course     Left heart cath report above. He had DES placed to his left circumflex that was totally occluded. He was hypotensive and tachycardiac post procedure. Pressors were avoided due to his tachycardia and options were limited due to his renal insufficiency. An intra aortic balloon pump was inserted for afterload reduction. He had IABP inserted for 2 days  and it was discontinued on 02/04/16.   He continued to have inferior ST elevation throughout his hospitalization. ST segments resolved some, but still persist at time of discharge.   His Echo showed a reduced EF of 35-40%, and medications were added for his ischemic cardiomyopathy with caution as he had  some hypotension that limited fully maximizing dosing. He will be discharged on Coreg 3.125mg  BID. As well as dual antiplatelet therapy with ASA and Brilinta.   He developed urinary rentention and a foley was placed as he had PVR of about . A urology consult was obtained and he will be discharged with a foley and be seen by urology in one week. He also had some hematuria likely due to the trauma of insertion.   He was deconditioned and weak so a PT consult was obtained. It was felt that he would benefit from placement in a SNF for rehabilitation.   He was seen today by Dr. Elease Hashimoto and deemed suitable for discharge. He will see Dr. Tresa Endo (per family's request) in 2 weeks.  _____________  Discharge Vitals Blood pressure 109/67, pulse 69, temperature 98.7 F (37.1 C), temperature source Oral, resp. rate 20, height  (1.88 m), weight 219 lb 12.8 oz (99.7 kg), SpO2 99 %.  Filed Weights   02/02/16 2101 02/03/16 0030  Weight: 220 lb (99.8 kg) 219 lb 12.8 oz (99.7 kg)    Physical Exam   GEN: Well nourished, well developed, in no acute distress.  HEENT: Grossly normal.  Neck: Supple, no JVD, carotid bruits, or masses. Cardiac: RRR, no murmurs, rubs, or gallops. No clubbing, cyanosis, edema.  Radials/DP/PT 2+ and equal bilaterally.  Respiratory:  Respirations regular and unlabored, clear to auscultation bilaterally. GI: Soft, nontender, nondistended, BS + x 4. MS: no deformity or atrophy. Skin: warm and dry, no rash. Neuro:  Strength and sensation are intact. Psych: AAOx3.  Normal affect.       Labs & Radiologic Studies     CBC  Recent Labs  02/09/16 0239  WBC 8.5  HGB 10.9*  HCT 33.8*  MCV 93.1  PLT 270   Basic Metabolic Panel  Recent Labs  02/08/16 0311  NA 134*  K 3.8  CL 102  CO2 24  GLUCOSE 128*  BUN 40*  CREATININE 1.21  CALCIUM 8.0*    Dg Chest Port 1 View  Result Date: 02/05/2016 CLINICAL DATA:  Shortness of breath, history of asbestosis EXAM:  PORTABLE CHEST 1 VIEW COMPARISON:  02/03/2016 FINDINGS: Cardiomediastinal silhouette is stable. Bilateral calcified pleural plaques are again noted. There is left basilar atelectasis or infiltrate. Atherosclerotic calcifications of thoracic aorta. No convincing pulmonary edema pre IMPRESSION: Bilateral calcified pleural plaques again noted. Left basilar atelectasis or infiltrate. No pulmonary edema. Electronically Signed   By: Natasha Mead M.D.   On: 02/05/2016 10:37   Dg Chest Port 1 View  Result Date: 02/03/2016 CLINICAL DATA:  Intra-aortic balloon pump. EXAM: PORTABLE CHEST 1 VIEW COMPARISON:  Yesterday FINDINGS: Aortic balloon pump tip is in stable position at the proximal descending segment. Chronic cardiopericardial enlargement. Stable vascular pedicle widening. Low volume chest with interstitial crowding at the bases. Diffuse calcified pleural plaques. No pneumothorax or effusion. IMPRESSION: 1. Stable unremarkable positioning of intra-aortic balloon pump. 2. Stable cardiopericardial enlargement.  No pulmonary edema. 3. Widespread calcified pleural plaques. Electronically Signed   By: Marnee Spring M.D.   On: 02/03/2016 07:07   Dg Chest Port 1 View  Result Date: 02/02/2016 CLINICAL DATA:  STEMI EXAM:  PORTABLE CHEST 1 VIEW COMPARISON:  02/02/2016 at 20:59 FINDINGS: An intra aortic balloon pump is visible at the level of the proximal descending aorta. Unchanged mediastinal contours. Extensive calcified pleural plaque is again evident. No airspace consolidation. No large effusion. No pneumothorax. IMPRESSION: Intra-aortic balloon pump appears satisfactorily positioned. Extensive calcified pleural plaque. Electronically Signed   By: Ellery Plunkaniel R Mitchell M.D.   On: 02/02/2016 23:59   Dg Chest Portable 1 View  Result Date: 02/02/2016 CLINICAL DATA:  STEMI EXAM: PORTABLE CHEST 1 VIEW COMPARISON:  None. FINDINGS: There is extensive calcified pleural plaque. No confluent airspace consolidation is evident,  but the study is limited due to the pleural changes. No large effusions. No pneumothorax. Hilar, mediastinal and cardiac contours are unremarkable. IMPRESSION: Extensive calcified pleural plaque. No large effusion or confluent airspace consolidation. Electronically Signed   By: Ellery Plunkaniel R Mitchell M.D.   On: 02/02/2016 21:25    Disposition   Pt is being discharged home today in good condition.  Follow-up Plans & Appointments     Contact information for follow-up providers    Nicki Guadalajarahomas Kelly, MD Follow up on 03/03/2016.   Specialty:  Cardiology Why:  at 3:00 pm for hospital follow up.  Contact information: [address] [place] [address] [phone]            Contact information for after-discharge care    Destination    [facility] .   Specialty:  Skilled Nursing Facility Contact information: [address] [place] [id number] [phone]                 Discharge Instructions    Amb Referral to Cardiac Rehabilitation    Complete by:  As directed    Diagnosis:   STEMI Coronary Stents     Diet - low sodium heart healthy    Complete by:  As directed    Increase activity slowly    Complete by:  As directed       Discharge Medications   Current Discharge Medication List    START taking these medications   Details  carvedilol (COREG) 3.125 MG tablet Take 1 tablet (3.125 mg total) by mouth 2 (two) times daily with a meal. Qty: 60 tablet, Refills: 12    lisinopril (PRINIVIL,ZESTRIL) 2.5 MG tablet Take 1 tablet (2.5 mg total) by mouth daily. Qty: 30 tablet, Refills: 12    pantoprazole (PROTONIX) 40 MG tablet Take 1 tablet (40 mg total) by mouth daily. Qty: 30 tablet, Refills: 12    ticagrelor (BRILINTA) 90 MG TABS tablet Take 1 tablet (90 mg total) by mouth 2 (two) times daily. Qty: 60 tablet, Refills: 12      CONTINUE these medications which have NOT CHANGED   Details  aspirin EC 81 MG tablet  Take 81 mg by mouth daily.      STOP taking these medications     ibuprofen (ADVIL,MOTRIN) 200 MG tablet          Aspirin prescribed at discharge?  Yes High Intensity Statin Prescribed? (Lipitor 40-80mg  or Crestor 20-40mg ): Yes Beta Blocker Prescribed? Yes For EF 40% or less, Was ACEI/ARB Prescribed? No: Hypotension ADP Receptor Inhibitor Prescribed? (i.e. Plavix etc.-Includes Medically Managed Patients): Yes For EF <40%, Aldosterone Inhibitor Prescribed? No: acute renal insufficiency  Was EF assessed during THIS hospitalization? Yes Was Cardiac Rehab II ordered? (Included Medically managed Patients): Yes   Outstanding Labs/Studies     Duration of Discharge Encounter   Greater than 30 minutes  including physician time.  Signed, Little Ishikawa NP 02/10/2016, 3:56 PM   Attending Note:   The patient was seen and examined.  Agree with assessment and plan as noted above.  Changes made to the above note as needed.  Patient seen and independently examined with Suzzette Righter, NP .   We discussed all aspects of the encounter. I agree with the assessment and plan as stated above.   pt has done well. Had some urethral bleeding after insertion of the foley. Seems stable  Have talked with urology .   He will need to keep the foley in for a week Going to SNF with foley. followup with urology in 1 week  See [place] on 03/02/16    I have spent a total of 40 minutes with patient reviewing hospital  notes , telemetry, EKGs, labs and examining patient as well as establishing an assessment and plan that was discussed with the patient. > 50% of time was spent in direct patient care.    Vesta Mixer, Montez Hageman., MD, [facility] 02/11/2016, 11:26 AM [address],  [address] Office [phone] Pager [phone]  \

## 2016-02-10 NOTE — Care Management Important Message (Signed)
Important Message  Patient Details  Name: Danny Oneill MRN: [id number] Date of Birth: [birth year]   Medicare Important Message Given:  Yes    Larya Charpentier Abena 02/10/2016, 11:46 AM

## 2016-02-10 NOTE — Care Management Note (Signed)
Case Management Note  Patient Details  Name: Danny Oneill MRN: [id number] Date of Birth: 04/24/1931  Subjective/Objective:    STEMI,  s/p PCI to OM2 occlusion Rx with DES stent               Action/Plan: Discharge Planning:  NCM spoke to son, Leonette Most and dtr, Okey Regal, and wife at bedside. Discussed extensively with family SNF choice and payment. Pt's choice were limited due to [facility]. Family is willing to pay out of pocket for SNF closer to family's home so pt's wife will be able to visit daily. Preference was [facility] but no bed availability. CSW following for SNF placement. Provided dtr with list of private duty agencies for future to hire aide to assist once pt returns home. Explained SNF will assist in getting HH arranged once dc after rehab. Pt agreeable to plan. Wife will discuss with their financial advisor about getting a new plan that will offer more benefits.    Expected Discharge Date:  02/11/2016               Expected Discharge Plan:  Skilled Nursing Facility  In-House Referral:  Clinical Social Work  Discharge planning Services  CM Consult  Post Acute Care Choice:  NA Choice offered to:  NA  DME Arranged:  N/A DME Agency:  NA  HH Arranged:  NA HH Agency:  NA  Status of Service:  Completed, signed off  If discussed at [employer] of Stay Meetings, dates discussed:    Additional Comments:  [name], RN 02/10/2016, 5:23 PM

## 2016-02-10 NOTE — [place] FL2 (Signed)
[place] MEDICAID FL2 LEVEL OF CARE SCREENING TOOL     IDENTIFICATION  Patient Name: Danny Oneill Birthdate: Apr 17, 1931 Sex: male Admission Date (Current Location): 02/02/2016  [facility] [place]   Facility and Address:  [facility] [place], [address], [place], [place] [address]      Provider Number: [id number]  Attending Physician Name and Address:  Corky CraftsJayadeep S Varanasi, MD  Relative Name and Phone Number:  Doy HutchingBarbara Gibeault, Spouse, [phone]    Current Level of Care: Hospital Recommended Level of Care: Skilled Nursing Facility Prior Approval Number:    Date Approved/Denied:   PASRR Number: [id number]  Discharge Plan: SNF    Current Diagnoses: Patient Active Problem List   Diagnosis Date Noted  . Acute inferolateral myocardial infarction (HCC) 02/02/2016  . Acute MI, inferolateral wall, initial episode of care (HCC)   . Arterial hypotension   . Overweight (BMI 25.0-29.9) 10/04/2013  . Expected blood loss anemia 10/04/2013  . S/P right TKA 10/03/2013    Orientation RESPIRATION BLADDER Height & Weight     Self, Time, Situation, Place  Normal Incontinent Weight: 219 lb 12.8 oz (99.7 kg) Height:  6\' 2"  (188 cm)  BEHAVIORAL SYMPTOMS/MOOD NEUROLOGICAL BOWEL NUTRITION STATUS      Incontinent Diet (Diet Heart Room service appropriate? Yes; Fluid consistency: Thin)  AMBULATORY STATUS COMMUNICATION OF NEEDS Skin   Limited Assist Verbally Normal                       Personal Care Assistance Level of Assistance  Bathing Bathing Assistance: Limited assistance         Functional Limitations Info             SPECIAL CARE FACTORS FREQUENCY  PT (By licensed PT), OT (By licensed OT), Speech therapy     PT Frequency: 5X WK OT Frequency: 5X WK     Speech Therapy Frequency: 5X WK      Contractures Contractures Info: Not present    Additional Factors Info  Code Status, Allergies Code Status Info: Full  Code Allergies Info: Morphine and Related           Current Medications (02/10/2016):  This is the current hospital active medication list Current Facility-Administered Medications  Medication Dose Route Frequency Provider Last Rate Last Dose  . 0.9 %  sodium chloride infusion  250 mL Intravenous PRN Corky CraftsJayadeep S Varanasi, MD      . 0.9 %  sodium chloride infusion   Intravenous Continuous Valentino NoseNathan Boswell, MD   Stopped at 02/07/16 1900  . acetaminophen (TYLENOL) tablet 650 mg  650 mg Oral Q4H PRN Corky CraftsJayadeep S Varanasi, MD   650 mg at 02/03/16 1610  . aspirin chewable tablet 81 mg  81 mg Oral Daily Corky CraftsJayadeep S Varanasi, MD   81 mg at 02/10/16 1039  . carvedilol (COREG) tablet 3.125 mg  3.125 mg Oral BID WC Azalee CourseHao Meng, PA   3.125 mg at 02/10/16 [id number]  . enoxaparin (LOVENOX) injection 40 mg  40 mg Subcutaneous Q24H Lennette Biharihomas A Kelly, MD   40 mg at 02/09/16 1200  . lisinopril (PRINIVIL,ZESTRIL) tablet 2.5 mg  2.5 mg Oral Daily Valentino NoseNathan Boswell, MD   2.5 mg at 02/10/16 1040  . morphine 2 MG/ML injection 2-3 mg  2-3 mg Intravenous Q2H PRN Romeo AppleStephen J Greene, MD   3 mg at 02/03/16 1643  . ondansetron (ZOFRAN) injection 4 mg  4 mg Intravenous Q6H PRN Corky CraftsJayadeep S Varanasi,  MD      . ondansetron (ZOFRAN) injection 4 mg  4 mg Intravenous Q6H PRN Lennette Bihari, MD      . pantoprazole (PROTONIX) EC tablet 40 mg  40 mg Oral Daily Romeo Apple, MD   40 mg at 02/10/16 1039  . sodium chloride flush (NS) 0.9 % injection 3 mL  3 mL Intravenous Q12H Corky Crafts, MD   3 mL at 02/09/16 2141  . sodium chloride flush (NS) 0.9 % injection 3 mL  3 mL Intravenous PRN Corky Crafts, MD      . ticagrelor [facility]) tablet 90 mg  90 mg Oral BID Corky Crafts, MD   90 mg at 02/10/16 1040     Discharge Medications: Please see discharge summary for a list of discharge medications.  Relevant Imaging Results:  Relevant Lab Results:   Additional Information SSN: [id number]  Volney American, LCSW

## 2016-02-10 NOTE — Progress Notes (Signed)
CARDIAC REHAB PHASE I   PRE:  Rate/Rhythm: 62 SR    BP: sitting 98/64    SaO2: 97 RA  MODE:  Ambulation: 500 ft   POST:  Rate/Rhythm: 71 SR with PVC    BP: sitting 106/56     SaO2: 97 RA  Pt able to slowly move legs over and get to EOB on his own. Stood with min assist x2 with gait belt. Steady walking with RW, assist x2 for supervision. Will make x1 next walk. To recliner. Pt tired after walk. He made trivial amount of urine after he got out of bed.  [id number]   Harriet Masson CES, ACSM 02/10/2016 11:49 AM

## 2016-02-10 NOTE — Clinical Social Work Note (Signed)
10:30 [facility] does not have bed availability and does not predict availability until three days. Patient medically stable for discharge per nurse in progression. Patient verbally agreed to go to [facility]. CSW explained to pt and pt's wife and daughter that they will have to cover what [facility] does not--patient agreeable. Janie at [employer] started auth on 02/10/16 at 11:30.   12: Per CM patient's son does not want pt to go to [facility]. Patient son now inquiring about bed availability at [facility] and [name]. Patient's son will private pay. CSW called both facilities and left voicemails--awaiting call backs.

## 2016-02-11 LAB — CBC
HCT: 32.2 % — ABNORMAL LOW (ref 39.0–52.0)
Hemoglobin: 10.4 g/dL — ABNORMAL LOW (ref 13.0–17.0)
MCH: 29.8 pg (ref 26.0–34.0)
MCHC: 32.3 g/dL (ref 30.0–36.0)
MCV: 92.3 fL (ref 78.0–100.0)
PLATELETS: 267 10*3/uL (ref 150–400)
RBC: 3.49 MIL/uL — AB (ref 4.22–5.81)
RDW: 13.9 % (ref 11.5–15.5)
WBC: 9.6 10*3/uL (ref 4.0–10.5)

## 2016-02-11 NOTE — Clinical Social Work Note (Signed)
Clinical Social Worker facilitated patient discharge including contacting patient family and facility to confirm patient discharge plans.  Clinical information faxed to facility and family agreeable with plan.  CSW arranged ambulance transport via [facility] to [facility].  RN to call report prior to discharge.  Clinical Social Worker will sign off for now as social work intervention is no longer needed. Please consult [place] again if new need arises.  [address], [place] [phone]

## 2016-02-11 NOTE — Progress Notes (Signed)
Order received to discharge patient.  Patient expresses readiness to discharge.  Telemetry monitor removed and CCMD notified.  PIV access removed without complication.  Patient transferred to [facility]

## 2016-02-11 NOTE — Progress Notes (Signed)
Report called to [name] at [facility]

## 2016-02-12 ENCOUNTER — Non-Acute Institutional Stay (SKILLED_NURSING_FACILITY): Payer: Medicare HMO | Admitting: Internal Medicine

## 2016-02-12 ENCOUNTER — Encounter: Payer: Self-pay | Admitting: Internal Medicine

## 2016-02-12 DIAGNOSIS — R3121 Asymptomatic microscopic hematuria: Secondary | ICD-10-CM

## 2016-02-12 DIAGNOSIS — I9581 Postprocedural hypotension: Secondary | ICD-10-CM | POA: Diagnosis not present

## 2016-02-12 DIAGNOSIS — E663 Overweight: Secondary | ICD-10-CM

## 2016-02-12 DIAGNOSIS — R600 Localized edema: Secondary | ICD-10-CM

## 2016-02-12 DIAGNOSIS — I2109 ST elevation (STEMI) myocardial infarction involving other coronary artery of anterior wall: Secondary | ICD-10-CM

## 2016-02-12 DIAGNOSIS — I2129 ST elevation (STEMI) myocardial infarction involving other sites: Secondary | ICD-10-CM | POA: Diagnosis not present

## 2016-02-12 DIAGNOSIS — R748 Abnormal levels of other serum enzymes: Secondary | ICD-10-CM

## 2016-02-12 DIAGNOSIS — I5041 Acute combined systolic (congestive) and diastolic (congestive) heart failure: Secondary | ICD-10-CM

## 2016-02-12 DIAGNOSIS — R338 Other retention of urine: Secondary | ICD-10-CM | POA: Diagnosis not present

## 2016-02-12 DIAGNOSIS — I2119 ST elevation (STEMI) myocardial infarction involving other coronary artery of inferior wall: Secondary | ICD-10-CM | POA: Diagnosis not present

## 2016-02-12 DIAGNOSIS — N179 Acute kidney failure, unspecified: Secondary | ICD-10-CM

## 2016-02-12 DIAGNOSIS — I9589 Other hypotension: Secondary | ICD-10-CM | POA: Diagnosis not present

## 2016-02-12 NOTE — Progress Notes (Signed)
: Provider:  Randon Goldsmith. Lyn Hollingshead, MD Location:  [facility] Room Number: [phone] Place of Service:  SNF ([phone])  PCP: [name], MD Patient Care Team: Geoffry Paradise, MD as PCP - General (Internal Medicine)  Extended Emergency Contact Information Primary Emergency Contact: Farrel Demark Address: [address]          [place], [place] [address] [place] of [place] Home Phone: [phone] Relation: Spouse Secondary Emergency Contact: Clelia Schaumann [place] of [place] Mobile Phone: [phone] Relation: Daughter     Allergies: Morphine and related  Chief Complaint  Patient presents with  . New Admit To SNF    Admit to Facility    HPI: Patient is 82 y.o. male whohx of arthritis s/p R knee arthroplasty, asbestosis presented to the ED by EMS with chest pain." Per patient and wife, pt has not been feeling well for last several days, had a low grade fever 3 days prior to admission with some short lived lower abdominal cramping, went to PCP day later where no fever was seen and pain resolved but given prescription for flagyl to empirically treat diverticulitis. For last 48 hours, patient continued to feel fatigued and had new onset intermittent chest pains, but this evening around 8:15 p developed gradual onset substernal chest pain, constant, non-radiating, 9/10 severity. Never had pain like this before.  Denies associated sob, n/v/diaphoresis, palpitations, light headedness/syncope.  Denies any cardiac hx, denies any hx of cath, heart surgery, MI or stress test. Only medication is ASA 81, does not take any anticoagulants. No history of bleeding/blood transfusion or CVA." Pt was admitted to [facility] from 10/29-11/7 where he was dx by EKG with acute inferolateral MI and having received heprin and ASA per EMS pt was taken immediately to the cath lab where pt was found to have multi vessel dx, received a stent to L circumflex and was placed on aortic balloon  pump for hypotension as renal insufficiency made pressors not a good option. Decisions was made no elective angioplasty for pt's other moderate disease and aggressive med therapy was planned along with dual antiplatelet therapy for a year. Aortic balloon pump was d/c 10/31. Complications were continues ST seg elevation and elevated LFT's so lipitor was held. Pt is d/c to SNF for generalized weakness for OT/PT.   Past Medical History:  Diagnosis Date  . Arthritis   . Asbestosis (HCC)    "no breathing problems" pt states worked in [employer] for many yrs and was exposed to asbestosis  . Hyperlipidemia   . Lumbar spondylosis     Past Surgical History:  Procedure Laterality Date  . CARDIAC CATHETERIZATION N/A 02/02/2016   Procedure: Left Heart Cath and Coronary Angiography;  Surgeon: Corky Crafts, MD;  [facility] INVAS[facility] CV LAB;  Service: Cardiovascular;  Laterality: N/A;  . CARDIAC CATHETERIZATION N/A 02/02/2016   Procedure: Coronary Stent Intervention;  Surgeon: Corky Crafts, MD;  Location: [facility] INVASIVE CV LAB;  Service: Cardiovascular;  Laterality: N/A;  . CARDIAC CATHETERIZATION N/A 02/02/2016   Procedure: IABP Insertion;  Surgeon: Corky Crafts, MD;  Location: [facility] INVASIVE CV LAB;  Service: Cardiovascular;  Laterality: N/A;  . CATARACTS REMOVED     BIL  . ROTATOR CUFF REPAIR  2011   L SHOULDER  . TOTAL KNEE ARTHROPLASTY Right 10/03/2013   Procedure: RIGHT TOTAL KNEE ARTHROPLASTY;  Surgeon: [name], MD;  Location: [facility] ORS;  Service: Orthopedics;  Laterality: Right;      Medication List  Accurate as of 02/12/16 11:51 AM. Always use your most recent med list.          aspirin EC 81 MG tablet Take 81 mg by mouth daily.   carvedilol 3.125 MG tablet Commonly known as:  COREG Take 1 tablet (3.125 mg total) by mouth 2 (two) times daily with a meal.   lisinopril 2.5 MG tablet Commonly known as:  PRINIVIL,ZESTRIL Take 1 tablet (2.5 mg total) by  mouth daily.   pantoprazole 40 MG tablet Commonly known as:  PROTONIX Take 1 tablet (40 mg total) by mouth daily.   ticagrelor 90 MG Tabs tablet Commonly known as:  BRILINTA Take 1 tablet (90 mg total) by mouth 2 (two) times daily.       No orders of the defined types were placed in this encounter.    There is no immunization history on file for this patient.  Social History  Substance Use Topics  . Smoking status: Never Smoker  . Smokeless tobacco: Never Used  . Alcohol use No    Family history is   Family History  Problem Relation Age of Onset  . Cancer Mother       Review of Systems  DATA OBTAINED: from patient,  family members GENERAL:  no fevers, fatigue, appetite changes SKIN: No itching, or rash EYES: No eye pain, redness, discharge EARS: No earache, tinnitus, change in hearing NOSE: No congestion, drainage or bleeding  MOUTH/THROAT: No mouth or tooth pain, No sore throat RESPIRATORY: No cough, wheezing, some SOB with walking CARDIAC: No chest pain, palpitations,+ lower extremity edema  GI: No abdominal pain, No N/V/D or constipation, No heartburn or reflux  GU: No dysuria, frequency or urgency, or incontinence  MUSCULOSKELETAL: No unrelieved bone/joint pain NEUROLOGIC: No headache, dizziness or focal weakness PSYCHIATRIC: No c/o anxiety or sadness   Vitals:   02/12/16 1053  BP: (!) 87/59  Pulse: 65  Resp: 18  Temp: 98.3 F (36.8 C)    SpO2 Readings from Last 1 Encounters:  02/12/16 93%   Body mass index is 28.12 kg/m.     Physical Exam  GENERAL APPEARANCE: Alert, conversant,  No acute distress.  SKIN: No diaphoresis rash HEAD: Normocephalic, atraumatic  EYES: Conjunctiva/lids clear. Pupils round, reactive. EOMs intact.  EARS: External exam WNL, canals clear. HOH NOSE: No deformity or discharge.  MOUTH/THROAT: Lips w/o lesions  RESPIRATORY: Breathing is even, unlabored. Lung sounds are slt rales at bases, not a great effort     CARDIOVASCULAR: Heart RRR no murmurs, rubs or gallops. 1+ peripheral edema.   GASTROINTESTINAL: Abdomen is soft, non-tender, not distended w/ normal bowel sounds. GENITOURINARY: Bladder non tender, not distended;foley, some blood  MUSCULOSKELETAL: No abnormal joints or musculature NEUROLOGIC:  Cranial nerves 2-12 grossly intact. Moves all extremities  PSYCHIATRIC: Mood and affect appropriate to situation, no behavioral issues  Patient Active Problem List   Diagnosis Date Noted  . Acute ST elevation myocardial infarction (STEMI) (HCC)   . Acute inferolateral myocardial infarction (HCC) 02/02/2016  . Acute MI, inferolateral wall, initial episode of care (HCC)   . Arterial hypotension   . Overweight (BMI 25.0-29.9) 10/04/2013  . Expected blood loss anemia 10/04/2013  . S/P right TKA 10/03/2013      Labs reviewed: Basic Metabolic Panel:    Component Value Date/Time   NA 134 (L) 02/08/2016 0311   NA 134 (A) 02/08/2016   K 3.8 02/08/2016 0311   CL 102 02/08/2016 0311   CO2 24 02/08/2016 0311  GLUCOSE 128 (H) 02/08/2016 0311   BUN 40 (H) 02/08/2016 0311   BUN 40 (A) 02/08/2016   CREATININE 1.21 02/08/2016 0311   CALCIUM 8.0 (L) 02/08/2016 0311   PROT 5.5 (L) 02/07/2016 0840   ALBUMIN 2.6 (L) 02/07/2016 0840   AST 70 (H) 02/07/2016 0840   ALT 123 (H) 02/07/2016 0840   ALKPHOS 41 02/07/2016 0840   BILITOT 1.0 02/07/2016 0840   GFRNONAA 54 (L) 02/08/2016 0311   GFRAA >60 02/08/2016 0311     Recent Labs  02/06/16 0303 02/07/16 0217 02/08/16 02/08/16 0311  NA 135 135 134* 134*  K 4.0 3.9  --  3.8  CL 104 104  --  102  CO2 24 24  --  24  GLUCOSE 106* 123*  --  128*  BUN 51* 50* 40* 40*  CREATININE 1.40* 1.23 1.2 1.21  CALCIUM 7.9* 8.2*  --  8.0*   Liver Function Tests:  Recent Labs  02/05/16 0236 02/06/16 0303 02/07/16 0840  AST 116* 64* 70*  ALT 186* 131* 123*  ALKPHOS 37* 38 41  BILITOT 1.1 1.0 1.0  PROT 5.7* 5.5* 5.5*  ALBUMIN 2.8* 2.5* 2.6*   No  results for input(s): LIPASE, AMYLASE in the last 8760 hours. No results for input(s): AMMONIA in the last 8760 hours. CBC:  Recent Labs  02/02/16 2100  02/05/16 1024 02/09/16 02/09/16 0239 02/11/16 0232  WBC 8.6  < > 11.2* 8.5 8.5 9.6  NEUTROABS 4.8  --   --   --   --   --   HGB 13.1  < > 11.6* 10.9* 10.9* 10.4*  HCT 40.5  < > 35.3* 34* 33.8* 32.2*  MCV 96.0  < > 93.4  --  93.1 92.3  PLT 236  < > 229 270 270 267  < > = values in this interval not displayed. Lipid  Recent Labs  02/02/16 2318  CHOL 131  HDL 44  LDLCALC 75  TRIG 61    Cardiac Enzymes:  Recent Labs  02/02/16 2100  02/03/16 0941 02/03/16 1616 02/05/16 1024  CKTOTAL 198  --   --   --   --   CKMB 4.8  --   --   --   --   TROPONINI 10.84*  < > 7.94* 7.73* 4.35*  < > = values in this interval not displayed. BNP:  Recent Labs  02/02/16 2100 02/03/16 0941  BNP 374.8* 227.8*   No results found for: [name] Lab Results  Component Value Date   HGBA1C 5.9 (H) 02/02/2016   No results found for: TSH No results found for: VITAMINB12 No results found for: FOLATE No results found for: IRON, TIBC, FERRITIN  Imaging and Procedures obtained prior to SNF admission: Dg Chest Port 1 View  Result Date: 02/03/2016 CLINICAL DATA:  Intra-aortic balloon pump. EXAM: PORTABLE CHEST 1 VIEW COMPARISON:  Yesterday FINDINGS: Aortic balloon pump tip is in stable position at the proximal descending segment. Chronic cardiopericardial enlargement. Stable vascular pedicle widening. Low volume chest with interstitial crowding at the bases. Diffuse calcified pleural plaques. No pneumothorax or effusion. IMPRESSION: 1. Stable unremarkable positioning of intra-aortic balloon pump. 2. Stable cardiopericardial enlargement.  No pulmonary edema. 3. Widespread calcified pleural plaques. Electronically Signed   By: Marnee Spring M.D.   On: 02/03/2016 07:07   Dg Chest Port 1 View  Result Date: 02/02/2016 CLINICAL DATA:  STEMI  EXAM: PORTABLE CHEST 1 VIEW COMPARISON:  02/02/2016 at 20:59 FINDINGS: An intra aortic balloon pump  is visible at the level of the proximal descending aorta. Unchanged mediastinal contours. Extensive calcified pleural plaque is again evident. No airspace consolidation. No large effusion. No pneumothorax. IMPRESSION: Intra-aortic balloon pump appears satisfactorily positioned. Extensive calcified pleural plaque. Electronically Signed   By: Ellery Plunkaniel R Mitchell M.D.   On: 02/02/2016 23:59   Dg Chest Portable 1 View  Result Date: 02/02/2016 CLINICAL DATA:  STEMI EXAM: PORTABLE CHEST 1 VIEW COMPARISON:  None. FINDINGS: There is extensive calcified pleural plaque. No confluent airspace consolidation is evident, but the study is limited due to the pleural changes. No large effusions. No pneumothorax. Hilar, mediastinal and cardiac contours are unremarkable. IMPRESSION: Extensive calcified pleural plaque. No large effusion or confluent airspace consolidation. Electronically Signed   By: Ellery Plunkaniel R Mitchell M.D.   On: 02/02/2016 21:25     Not all labs, radiology exams or other studies done during hospitalization come through on my EPIC note; however they are reviewed by me.    Assessment and Plan  ACUTE INFEROLATERAL MI/HYPOTENSION/PERISITENT ST SEG ELEVATION/ SYSTOLIC AND DIASTOLIC CHF WITH ef 35% - had DES placed to his left circumflex that was totally occluded. He was hypotensive and tachycardiac post procedure. Pressors were avoided due to his tachycardia and options were limited due to his renal insufficiency. An intra aortic balloon pump was inserted for afterload reduction. He had IABP inserted for 2 days and it was discontinued on 02/04/16; PT continued to have inferior ST elevation throughout his hospitalization. ST segments resolved some, but still persist at time of discharge; PT'S Echo showed a reduced EF of 35-40%, and medications were added for his ischemic cardiomyopathy with caution as he had some  hypotension that limited fully maximizing dosing. He will be discharged on Coreg 3.125mg  BID. As well as dual antiplatelet therapy with ASA and Brilinta.  SNF - admitted for OT/PT; cont coreg 3.125 BID, lisinopril 2,5 mg daily, ASA 81 mg daily and Brilinta90 mg BID; BP's are low, monitoring; will hold lisinopril if absolutely necessary  URINARY RETENTION/HEMATURIA - foley placed, hematuria felt 2/2 Brilinta and ASA SNF - moniotr;pla to be tolerant of some hematuria  ELEVATED LFTS/HLD - pt's lipitor was held until LFTs return to normal which was not at d/c SNF - CMP on Monday 11/13 to follow LFT's and add lipitor when back to normal  ARF - 2/2 MI SNF - d/c Cr 1.21; CMP on 11/13  LE EDEMA/LOW BP SNF - 2/2 fluid resusIitation and EF 35%; lasix not an option 2/2 low BP; thigh high TED hose    Time spent > 60 min;> 50% of time with patient was spent reviewing records, labs, tests and studies, counseling and developing plan of care  Thurston Holenne D. Lyn HollingsheadAlexander, MD

## 2016-02-13 ENCOUNTER — Encounter: Payer: Self-pay | Admitting: Internal Medicine

## 2016-02-13 DIAGNOSIS — N179 Acute kidney failure, unspecified: Secondary | ICD-10-CM | POA: Insufficient documentation

## 2016-02-13 DIAGNOSIS — I2109 ST elevation (STEMI) myocardial infarction involving other coronary artery of anterior wall: Secondary | ICD-10-CM | POA: Insufficient documentation

## 2016-02-13 DIAGNOSIS — R748 Abnormal levels of other serum enzymes: Secondary | ICD-10-CM | POA: Insufficient documentation

## 2016-02-13 DIAGNOSIS — E785 Hyperlipidemia, unspecified: Secondary | ICD-10-CM | POA: Insufficient documentation

## 2016-02-13 DIAGNOSIS — I5041 Acute combined systolic (congestive) and diastolic (congestive) heart failure: Secondary | ICD-10-CM | POA: Insufficient documentation

## 2016-02-13 DIAGNOSIS — I959 Hypotension, unspecified: Secondary | ICD-10-CM | POA: Insufficient documentation

## 2016-02-13 DIAGNOSIS — R319 Hematuria, unspecified: Secondary | ICD-10-CM | POA: Insufficient documentation

## 2016-02-13 DIAGNOSIS — I2129 ST elevation (STEMI) myocardial infarction involving other sites: Secondary | ICD-10-CM

## 2016-02-13 DIAGNOSIS — R338 Other retention of urine: Secondary | ICD-10-CM | POA: Insufficient documentation

## 2016-02-13 DIAGNOSIS — R600 Localized edema: Secondary | ICD-10-CM | POA: Insufficient documentation

## 2016-02-17 ENCOUNTER — Encounter: Payer: Self-pay | Admitting: Internal Medicine

## 2016-02-17 ENCOUNTER — Non-Acute Institutional Stay (SKILLED_NURSING_FACILITY): Payer: Medicare HMO | Admitting: Internal Medicine

## 2016-02-17 DIAGNOSIS — I2109 ST elevation (STEMI) myocardial infarction involving other coronary artery of anterior wall: Secondary | ICD-10-CM

## 2016-02-17 DIAGNOSIS — N179 Acute kidney failure, unspecified: Secondary | ICD-10-CM | POA: Diagnosis not present

## 2016-02-17 DIAGNOSIS — I5041 Acute combined systolic (congestive) and diastolic (congestive) heart failure: Secondary | ICD-10-CM

## 2016-02-17 DIAGNOSIS — R338 Other retention of urine: Secondary | ICD-10-CM

## 2016-02-17 DIAGNOSIS — I9589 Other hypotension: Secondary | ICD-10-CM

## 2016-02-17 DIAGNOSIS — I2121 ST elevation (STEMI) myocardial infarction involving left circumflex coronary artery: Secondary | ICD-10-CM

## 2016-02-17 DIAGNOSIS — R748 Abnormal levels of other serum enzymes: Secondary | ICD-10-CM

## 2016-02-17 DIAGNOSIS — R3121 Asymptomatic microscopic hematuria: Secondary | ICD-10-CM | POA: Diagnosis not present

## 2016-02-17 DIAGNOSIS — R600 Localized edema: Secondary | ICD-10-CM

## 2016-02-17 DIAGNOSIS — E784 Other hyperlipidemia: Secondary | ICD-10-CM

## 2016-02-17 DIAGNOSIS — I2129 ST elevation (STEMI) myocardial infarction involving other sites: Secondary | ICD-10-CM | POA: Diagnosis not present

## 2016-02-17 DIAGNOSIS — E7849 Other hyperlipidemia: Secondary | ICD-10-CM

## 2016-02-17 DIAGNOSIS — I2119 ST elevation (STEMI) myocardial infarction involving other coronary artery of inferior wall: Secondary | ICD-10-CM | POA: Diagnosis not present

## 2016-02-17 LAB — BASIC METABOLIC PANEL
BUN: 37 mg/dL — AB (ref 4–21)
Creatinine: 1 mg/dL (ref 0.6–1.3)
Glucose: 151 mg/dL
Potassium: 4.6 mmol/L (ref 3.4–5.3)
SODIUM: 132 mmol/L — AB (ref 137–147)

## 2016-02-17 LAB — HEPATIC FUNCTION PANEL
ALK PHOS: 68 U/L (ref 25–125)
ALT: 59 U/L — AB (ref 10–40)
AST: 42 U/L — AB (ref 14–40)
BILIRUBIN, TOTAL: 0.9 mg/dL

## 2016-02-17 LAB — CBC AND DIFFERENTIAL
HEMATOCRIT: 35 % — AB (ref 41–53)
HEMOGLOBIN: 11 g/dL — AB (ref 13.5–17.5)
Platelets: 318 10*3/uL (ref 150–399)
WBC: 9.1 10^3/mL

## 2016-02-17 NOTE — Assessment & Plan Note (Signed)
Labs on 11/13 - TBIL - 0.9, ALT - 59, AST 42, ALKPHOS 68; this was deemed normal enough to start pt on lipitor; based on FLP in hospital Lipitor 20 mg daily seemed reasonable

## 2016-02-17 NOTE — Assessment & Plan Note (Signed)
Lipitor 20 mg daily started at d/c from [facility]

## 2016-02-17 NOTE — Assessment & Plan Note (Signed)
Pt's SBP consisttently ran in then mid to low 90's so lisinopril was decreased from 2.5 daily to 1.25 mg daily after which SBP ran in the low 100's.

## 2016-02-17 NOTE — Progress Notes (Signed)
Location:  [facility] Room Number: [phone] Place of Service:  SNF [phone])  Danny Oneill. Lyn Hollingshead, MD  PCP: [name], MD Patient Care Team: Geoffry Paradise, MD as PCP - General (Internal Medicine)  Extended Emergency Contact Information Primary Emergency Contact: Farrel Demark Address: [address]          [place], [place] [address] [place] of [place] Home Phone: [phone] Relation: Spouse Secondary Emergency Contact: Clelia Schaumann [place] of [place] Mobile Phone: [phone] Relation: Daughter  Allergies  Allergen Reactions  . Morphine And Related Other (See Comments)    Confusion/ hallucinations     Chief Complaint  Patient presents with  . Discharge Note    Discharged from SNF    HPI:  82 y.o. male hx of arthritis s/p R knee arthroplasty, asbestosis presented to the ED by EMS with chest pain."Per patient and wife, pt has not been feeling well for last several days, had a low grade fever 3 days prior to admission with some short lived lower abdominal cramping, went to PCP day later where no fever was seen and pain resolved but given prescription for flagyl to empirically treat diverticulitis. For last 48 hours, patient continued to feel fatigued and had new onset intermittent chest pains, but this evening around 8:15 p developed gradual onset substernal chest pain, constant, non-radiating, 9/10 severity. Never had pain like this before. Denies associated sob, n/v/diaphoresis, palpitations, light headedness/syncope. Denies any cardiac hx, denies any hx of cath, heart surgery, MI or stress test. Only medication is ASA 81, does not take any anticoagulants. No history of bleeding/blood transfusion or CVA." Pt was admitted to [facility] from 10/29-11/7 where he was dx by EKG with acute inferolateral MI and having received heprin and ASA per EMS pt was taken immediately to the cath lab where pt was found to have multi vessel dx, received a  stent to L circumflex and was placed on aortic balloon pump for hypotension as renal insufficiency made pressors not a good option. Decisions was made no elective angioplasty for pt's other moderate disease and aggressive med therapy was planned along with dual antiplatelet therapy for a year. Aortic balloon pump was d/c 10/31. Complications were continues ST seg elevation and elevated LFT's so lipitor was held. Pt was d/c to SNF for generalized weakness for OT/PT and in now ready to be d/c to home.    Past Medical History:  Diagnosis Date  . Arthritis   . Asbestosis (HCC)    "no breathing problems" pt states worked in [employer] for many yrs and was exposed to asbestosis  . Hyperlipidemia   . Lumbar spondylosis     Past Surgical History:  Procedure Laterality Date  . CARDIAC CATHETERIZATION N/A 02/02/2016   Procedure: Left Heart Cath and Coronary Angiography;  Surgeon: Corky Crafts, MD;  Location: [facility] INVASIVE CV LAB;  Service: Cardiovascular;  Laterality: N/A;  . CARDIAC CATHETERIZATION N/A 02/02/2016   Procedure: Coronary Stent Intervention;  Surgeon: Corky Crafts, MD;  Location: [facility] INVASIVE CV LAB;  Service: Cardiovascular;  Laterality: N/A;  . CARDIAC CATHETERIZATION N/A 02/02/2016   Procedure: IABP Insertion;  Surgeon: Corky Crafts, MD;  Location: [facility] INVASIVE CV LAB;  Service: Cardiovascular;  Laterality: N/A;  . CATARACTS REMOVED     BIL  . ROTATOR CUFF REPAIR  2011   L SHOULDER  . TOTAL KNEE ARTHROPLASTY Right 10/03/2013   Procedure: RIGHT TOTAL KNEE ARTHROPLASTY;  Surgeon: [name], MD;  Location:  [facility] ORS;  Service: Orthopedics;  Laterality: Right;     reports that he has never smoked. He has never used smokeless tobacco. He reports that he does not drink alcohol or use drugs. Social History   Social History  . Marital status: Married    Spouse name: N/A  . Number of children: N/A  . Years of education: N/A   Occupational History  . Not on  file.   Social History Main Topics  . Smoking status: Never Smoker  . Smokeless tobacco: Never Used  . Alcohol use No  . Drug use: No  . Sexual activity: Not on file   Other Topics Concern  . Not on file   Social History Narrative  . No narrative on file    Pertinent  Health Maintenance Due  Topic Date Due  . PNA vac Low Risk Adult (1 of 2 - PCV13) 04/21/1998  . INFLUENZA VACCINE  11/05/2015    Medications:   Medication List       Accurate as of 02/17/16  4:31 PM. Always use your most recent med list.          aspirin EC 81 MG tablet Take 81 mg by mouth daily.   carvedilol 3.125 MG tablet Commonly known as:  COREG Take 1 tablet (3.125 mg total) by mouth 2 (two) times daily with a meal.   lisinopril 2.5 MG tablet Commonly known as:  PRINIVIL,ZESTRIL Take 1/2 (1.25 mg) by mouth daily.   Melatonin 3 MG Tabs Take 1 tablet by mouth at bedtime as needed.   pantoprazole 40 MG tablet Commonly known as:  PROTONIX Take 1 tablet (40 mg total) by mouth daily.   ticagrelor 90 MG Tabs tablet Commonly known as:  BRILINTA Take 1 tablet (90 mg total) by mouth 2 (two) times daily.        Vitals:   02/17/16 1522  BP: 105/61  Pulse: 68  Resp: 18  Temp: 97 F (36.1 C)  Weight: 233 lb 9.6 oz (106 kg)  Height: 6\' 2"  (1.88 m)   Body mass index is 29.99 kg/m.  Physical Exam  GENERAL APPEARANCE: Alert, conversant. No acute distress.  HEENT: Unremarkable. RESPIRATORY: Breathing is even, unlabored. Lung sounds are clear   CARDIOVASCULAR: Heart RRR no murmurs, rubs or gallops. No peripheral edema.  GASTROINTESTINAL: Abdomen is soft, non-tender, not distended w/ normal bowel sounds.  NEUROLOGIC: Cranial nerves 2-12 grossly intact. Moves all extremities   Labs reviewed: Basic Metabolic Panel:  Recent Labs  16/10/96 0303 02/07/16 0217 02/08/16 02/08/16 0311 02/17/16  NA 135 135 134* 134* 132*  K 4.0 3.9  --  3.8 4.6  CL 104 104  --  102  --   CO2 24 24  --   24  --   GLUCOSE 106* 123*  --  128*  --   BUN 51* 50* 40* 40* 37*  CREATININE 1.40* 1.23 1.2 1.21 1.0  CALCIUM 7.9* 8.2*  --  8.0*  --    No results found for: [facility] Liver Function Tests:  Recent Labs  02/05/16 0236 02/06/16 0303 02/07/16 0840 02/17/16  AST 116* 64* 70* 42*  ALT 186* 131* 123* 59*  ALKPHOS 37* 38 41 68  BILITOT 1.1 1.0 1.0  --   PROT 5.7* 5.5* 5.5*  --   ALBUMIN 2.8* 2.5* 2.6*  --    No results for input(s): LIPASE, AMYLASE in the last 8760 hours. No results for input(s): AMMONIA in the last 8760 hours. CBC:  Recent Labs  02/02/16 2100  02/05/16 1024  02/09/16 0239 02/11/16 0232 02/17/16  WBC 8.6  < > 11.2*  < > 8.5 9.6 9.1  NEUTROABS 4.8  --   --   --   --   --   --   HGB 13.1  < > 11.6*  < > 10.9* 10.4* 11.0*  HCT 40.5  < > 35.3*  < > 33.8* 32.2* 35*  MCV 96.0  < > 93.4  --  93.1 92.3  --   PLT 236  < > 229  < > 270 267 318  < > = values in this interval not displayed. Lipid  Recent Labs  02/02/16 2318  CHOL 131  HDL 44  LDLCALC 75  TRIG 61   Cardiac Enzymes:  Recent Labs  02/02/16 2100  02/03/16 0941 02/03/16 1616 02/05/16 1024  CKTOTAL 198  --   --   --   --   CKMB 4.8  --   --   --   --   TROPONINI 10.84*  < > 7.94* 7.73* 4.35*  < > = values in this interval not displayed. BNP:  Recent Labs  02/02/16 2100 02/03/16 0941  BNP 374.8* 227.8*   CBG: No results for input(s): GLUCAP in the last 8760 hours.  Procedures and Imaging Studies During Stay: Dg Chest Port 1 View  Result Date: 02/05/2016 CLINICAL DATA:  Shortness of breath, history of asbestosis EXAM: PORTABLE CHEST 1 VIEW COMPARISON:  02/03/2016 FINDINGS: Cardiomediastinal silhouette is stable. Bilateral calcified pleural plaques are again noted. There is left basilar atelectasis or infiltrate. Atherosclerotic calcifications of thoracic aorta. No convincing pulmonary edema pre IMPRESSION: Bilateral calcified pleural plaques again noted. Left basilar atelectasis or  infiltrate. No pulmonary edema. Electronically Signed   By: Natasha MeadLiviu  Pop M.D.   On: 02/05/2016 10:37   Dg Chest Port 1 View  Result Date: 02/03/2016 CLINICAL DATA:  Intra-aortic balloon pump. EXAM: PORTABLE CHEST 1 VIEW COMPARISON:  Yesterday FINDINGS: Aortic balloon pump tip is in stable position at the proximal descending segment. Chronic cardiopericardial enlargement. Stable vascular pedicle widening. Low volume chest with interstitial crowding at the bases. Diffuse calcified pleural plaques. No pneumothorax or effusion. IMPRESSION: 1. Stable unremarkable positioning of intra-aortic balloon pump. 2. Stable cardiopericardial enlargement.  No pulmonary edema. 3. Widespread calcified pleural plaques. Electronically Signed   By: Marnee SpringJonathon  Watts M.D.   On: 02/03/2016 07:07   Dg Chest Port 1 View  Result Date: 02/02/2016 CLINICAL DATA:  STEMI EXAM: PORTABLE CHEST 1 VIEW COMPARISON:  02/02/2016 at 20:59 FINDINGS: An intra aortic balloon pump is visible at the level of the proximal descending aorta. Unchanged mediastinal contours. Extensive calcified pleural plaque is again evident. No airspace consolidation. No large effusion. No pneumothorax. IMPRESSION: Intra-aortic balloon pump appears satisfactorily positioned. Extensive calcified pleural plaque. Electronically Signed   By: Ellery Plunkaniel R Mitchell M.D.   On: 02/02/2016 23:59   Dg Chest Portable 1 View  Result Date: 02/02/2016 CLINICAL DATA:  STEMI EXAM: PORTABLE CHEST 1 VIEW COMPARISON:  None. FINDINGS: There is extensive calcified pleural plaque. No confluent airspace consolidation is evident, but the study is limited due to the pleural changes. No large effusions. No pneumothorax. Hilar, mediastinal and cardiac contours are unremarkable. IMPRESSION: Extensive calcified pleural plaque. No large effusion or confluent airspace consolidation. Electronically Signed   By: Ellery Plunkaniel R Mitchell M.D.   On: 02/02/2016 21:25    Assessment/Plan:   Acute inferolateral  myocardial infarction [facility](HCC)  Acute ST elevation myocardial infarction (  STEMI) involving left circumflex coronary artery (HCC)  Systolic and diastolic CHF, acute (HCC)  Other specified hypotension  Acute renal failure, unspecified acute renal failure type (HCC)  Acute urinary retention  Anterior and lateral ST segment elevation (HCC)  Asymptomatic microscopic hematuria  Elevated liver enzymes  Bilateral lower extremity edema  Other hyperlipidemia  HYPOTENSION - Of note , Lisinopril 2.5 mg daily was dec to 1.25 mg daily and SBP improved from low 90's to low 100's.  ARF - Cr on 11/13 was 1.03  ELEVATED LFT/HLD - 11/13 TBILI - 0.9, ALT - 59, AST - 42, and ALKPHOS - 68   Based on pt's FLP in hospital and almost completely normal LFT's pt was started on lipitor 20 mg daily on d/c from SNF  LE EDEMA - improved but still present ;thigh high TED hose have been attempted  Patient is being discharged with the following home health services:  HH/OT/PT/Nursing  Patient is being discharged with the following durable medical equipment:  Rolling walker, bedside commode  Patient has been advised to f/u with their PCP in 1-2 weeks to bring them up to date on their rehab stay.  Social services at facility was responsible for arranging this appointment.  Pt was provided with a 30 day supply of prescriptions for medications and refills must be obtained from their PCP.  For controlled substances, a more limited supply may be provided adequate until PCP appointment only.   Time spent >30 min;> 50% of time with patient was spent reviewing records, labs, tests and studies, counseling and developing plan of care  Danny Oneill. Lyn Hollingshead, MD

## 2016-02-17 NOTE — Assessment & Plan Note (Signed)
TED hose were ordered; with low BP lasix was not an option

## 2016-02-17 NOTE — Assessment & Plan Note (Signed)
Cr 11/13 1.03

## 2016-02-18 ENCOUNTER — Encounter (HOSPITAL_COMMUNITY): Payer: Self-pay | Admitting: Emergency Medicine

## 2016-02-18 ENCOUNTER — Emergency Department (HOSPITAL_COMMUNITY)
Admission: EM | Admit: 2016-02-18 | Discharge: 2016-02-18 | Disposition: A | Discharge status: Home / Self Care | Payer: Medicare HMO | Attending: Emergency Medicine | Admitting: Emergency Medicine

## 2016-02-18 DIAGNOSIS — E871 Hypo-osmolality and hyponatremia: Secondary | ICD-10-CM | POA: Diagnosis not present

## 2016-02-18 DIAGNOSIS — R339 Retention of urine, unspecified: Secondary | ICD-10-CM | POA: Insufficient documentation

## 2016-02-18 DIAGNOSIS — R1030 Lower abdominal pain, unspecified: Secondary | ICD-10-CM | POA: Diagnosis present

## 2016-02-18 HISTORY — DX: Acute myocardial infarction, unspecified: I21.9

## 2016-02-18 LAB — I-STAT CHEM 8, ED
BUN: 36 mg/dL — AB (ref 6–20)
Calcium, Ion: 1.05 mmol/L — ABNORMAL LOW (ref 1.15–1.40)
Chloride: 93 mmol/L — ABNORMAL LOW (ref 101–111)
Creatinine, Ser: 1.2 mg/dL (ref 0.61–1.24)
Glucose, Bld: 127 mg/dL — ABNORMAL HIGH (ref 65–99)
HEMATOCRIT: 33 % — AB (ref 39.0–52.0)
HEMOGLOBIN: 11.2 g/dL — AB (ref 13.0–17.0)
Potassium: 4.7 mmol/L (ref 3.5–5.1)
SODIUM: 126 mmol/L — AB (ref 135–145)
TCO2: 22 mmol/L (ref 0–100)

## 2016-02-18 LAB — URINALYSIS, ROUTINE W REFLEX MICROSCOPIC
BILIRUBIN URINE: NEGATIVE
Glucose, UA: NEGATIVE mg/dL
KETONES UR: NEGATIVE mg/dL
NITRITE: NEGATIVE
PH: 6 (ref 5.0–8.0)
PROTEIN: NEGATIVE mg/dL
Specific Gravity, Urine: 1.017 (ref 1.005–1.030)

## 2016-02-18 LAB — URINE MICROSCOPIC-ADD ON

## 2016-02-18 NOTE — Discharge Instructions (Signed)
Follow-up with urologist tomorrow as planned.  A urine culture was sent on the urinalysis today. Sodium level was low on one of the blood tests. Please recheck this routinely in the next week or so with your primary doctor.

## 2016-02-18 NOTE — ED Provider Notes (Signed)
[facility]-EMERGENCY DEPT Provider Note   CSN: [id number] Arrival date & time: 02/18/16  1953     History   Chief Complaint Chief Complaint  Patient presents with  . Urinary Retention    HPI Beryle LatheJames Harker is a 82 y.o. male.  HPI Pt had urinary retention after a recent hospitalization.  Pt saw the urologist Today and had the catheter removed. Since the removal of the catheter the patient has not been able to urinate. He started having progressive swelling and discomfort in his lower abdomen. Patient feels like he has to urinate but can't do so. Denies any fevers or chills. No vomiting or diarrhea. Past Medical History:  Diagnosis Date  . Arthritis   . Asbestosis (HCC)    "no breathing problems" pt states worked in [employer] for many yrs and was exposed to asbestosis  . Hyperlipidemia   . Lumbar spondylosis   . Myocardial infarction     Patient Active Problem List   Diagnosis Date Noted  . Systolic and diastolic CHF, acute (HCC) 02/13/2016  . Anterior and lateral ST segment elevation (HCC) 02/13/2016  . ARF (acute renal failure) (HCC) 02/13/2016  . Acute urinary retention 02/13/2016  . Hematuria 02/13/2016  . Elevated liver enzymes 02/13/2016  . Bilateral lower extremity edema 02/13/2016  . Low BP 02/13/2016  . Hyperlipidemia 02/13/2016  . Acute ST elevation myocardial infarction (STEMI) (HCC)   . Acute inferolateral myocardial infarction (HCC) 02/02/2016  . Acute MI, inferolateral wall, initial episode of care (HCC)   . Arterial hypotension   . Overweight (BMI 25.0-29.9) 10/04/2013  . Expected blood loss anemia 10/04/2013  . S/P right TKA 10/03/2013    Past Surgical History:  Procedure Laterality Date  . CARDIAC CATHETERIZATION N/A 02/02/2016   Procedure: Left Heart Cath and Coronary Angiography;  Surgeon: Corky CraftsJayadeep S Varanasi, MD;  [facility];  Service: Cardiovascular;  Laterality: N/A;  . CARDIAC CATHETERIZATION N/A 02/02/2016   Procedure:  Coronary Stent Intervention;  Surgeon: Corky CraftsJayadeep S Varanasi, MD;  [facility];  Service: Cardiovascular;  Laterality: N/A;  . CARDIAC CATHETERIZATION N/A 02/02/2016   Procedure: IABP Insertion;  Surgeon: Corky CraftsJayadeep S Varanasi, MD;  Location: [facility] INVASIVE CV LAB;  Service: Cardiovascular;  Laterality: N/A;  . CATARACTS REMOVED     BIL  . ROTATOR CUFF REPAIR  2011   L SHOULDER  . TOTAL KNEE ARTHROPLASTY Right 10/03/2013   Procedure: RIGHT TOTAL KNEE ARTHROPLASTY;  Surgeon: Shelda PalMatthew D Olin, MD;  Location: [facility] ORS;  Service: Orthopedics;  Laterality: Right;       Home Medications    Prior to Admission medications   Medication Sig Start Date End Date Taking? Authorizing Provider  aspirin EC 81 MG tablet Take 81 mg by mouth daily with breakfast.    Yes Historical Provider, MD  atorvastatin (LIPITOR) 20 MG tablet Take 20 mg by mouth daily. 02/17/16  Yes Historical Provider, MD  carvedilol (COREG) 3.125 MG tablet Take 1 tablet (3.125 mg total) by mouth 2 (two) times daily with a meal. 02/10/16  Yes Little IshikawaErin E Smith, NP  lisinopril (PRINIVIL,ZESTRIL) 2.5 MG tablet Take 1.25 mg by mouth daily.    Yes Historical Provider, MD  Melatonin 5 MG TABS Take 1 tablet by mouth at bedtime.   Yes Historical Provider, MD  pantoprazole (PROTONIX) 40 MG tablet Take 1 tablet (40 mg total) by mouth daily. 02/11/16  Yes Little IshikawaErin E Smith, NP  silodosin (RAPAFLO) 8 MG CAPS capsule Take 8 mg by mouth  daily.   Yes Historical Provider, MD  ticagrelor (BRILINTA) 90 MG TABS tablet Take 1 tablet (90 mg total) by mouth 2 (two) times daily. 02/10/16  Yes Little Ishikawa, NP    Family History Family History  Problem Relation Age of Onset  . Cancer Mother     Social History Social History  Substance Use Topics  . Smoking status: Never Smoker  . Smokeless tobacco: Never Used  . Alcohol use No     Allergies   Morphine and related   Review of Systems Review of Systems  All other systems reviewed and are  negative.    Physical Exam Updated Vital Signs BP 120/80 (BP Location: Right Arm)   Temp 97.7 F (36.5 C) (Oral)   Resp 15   Ht 6\' 2"  (1.88 m)   Wt 104.3 kg   SpO2 100%   BMI 29.53 kg/m   Physical Exam  Constitutional: He appears well-developed and well-nourished. No distress.  HENT:  Head: Normocephalic and atraumatic.  Right Ear: External ear normal.  Left Ear: External ear normal.  Eyes: Conjunctivae are normal. Right eye exhibits no discharge. Left eye exhibits no discharge. No scleral icterus.  Neck: Neck supple. No tracheal deviation present.  Cardiovascular: Normal rate.   Pulmonary/Chest: Effort normal. No stridor. No respiratory distress.  Abdominal: He exhibits no distension. There is tenderness in the suprapubic area. There is no rigidity and no guarding. No hernia.  Musculoskeletal: He exhibits no edema.  Neurological: He is alert. Cranial nerve deficit: no gross deficits.  Skin: Skin is warm and dry. No rash noted.  Psychiatric: He has a normal mood and affect.  Nursing note and vitals reviewed.    ED Treatments / Results  Labs (all labs ordered are listed, but only abnormal results are displayed) Labs Reviewed  URINALYSIS, ROUTINE W REFLEX MICROSCOPIC (NOT AT [facility]) - Abnormal; Notable for the following:       Result Value   APPearance CLOUDY (*)    Hgb urine dipstick TRACE (*)    Leukocytes, UA SMALL (*)    All other components within normal limits  URINE MICROSCOPIC-ADD ON - Abnormal; Notable for the following:    Squamous Epithelial / LPF 0-5 (*)    Bacteria, UA MANY (*)    All other components within normal limits  I-STAT CHEM 8, ED - Abnormal; Notable for the following:    Sodium 126 (*)    Chloride 93 (*)    BUN 36 (*)    Glucose, Bld 127 (*)    Calcium, Ion 1.05 (*)    Hemoglobin 11.2 (*)    HCT 33.0 (*)    All other components within normal limits  URINE CULTURE     Procedures Procedures (including critical care time)   Initial  Impression / Assessment and Plan / ED Course  I have reviewed the triage vital signs and the nursing notes.  Pertinent labs & imaging results that were available during my care of the patient were reviewed by me and considered in my medical decision making (see chart for details).  Clinical Course   Laboratory tests reviewed. Hemoglobin is stable. Sodium level is decreased compared to previous values. Urinalysis does show many bacteria.  We will send off a urine culture to assess for infection. Patient's previous urinalysis did show many bacteria.   The patient had a Foley catheter placed. He drained 500 mL of urine.  The patient is feeling much better now and is ready to go  home. Here he has planned follow-up with urologist tomorrow. I discussed the findings with the patient and his sons. The patient is ready for discharge.  Final Clinical Impressions(s) / ED Diagnoses   Final diagnoses:  Urinary retention  Hyponatremia    New Prescriptions New Prescriptions   No medications on file     Linwood Dibbles, MD 02/18/16 2236

## 2016-02-18 NOTE — ED Triage Notes (Signed)
PT was discharged from SNF yesterday after hospitalization and recovery from MI; pt had foley removed this AM at 11 and has been unable to urinate since

## 2016-02-19 ENCOUNTER — Telehealth: Payer: Self-pay | Admitting: Cardiovascular Disease

## 2016-02-19 NOTE — Telephone Encounter (Signed)
Spoke with Danny Oneill in billing she will research and see what she can do

## 2016-02-19 NOTE — Telephone Encounter (Signed)
New message  Pt's daughter is calling  Referral has not been sent to the skilled nursing care facility  Insurance needs so pt can stay  Please call back and advise

## 2016-02-19 NOTE — Telephone Encounter (Signed)
Spoke with daughter she states that her father was in a SNF and they are telling her that she needs to pay at 100% please advuse

## 2016-02-19 NOTE — Telephone Encounter (Signed)
Rec'd phone message (from pt daughter Margaretmary Bayley) regarding patient's stay in SNF.  They were told at discharge it was not covered b/c there was no referral. I called [name] & spoke with [place] in [place].  She had no idea what I was talking about.  She asked me to have Ms. Hollice Espy call her to discuss.  Gave Ms. Hollice Espy Denise's name & contact info.  Also provided my #, should she need more info from [place].

## 2016-02-20 ENCOUNTER — Telehealth: Payer: Self-pay | Admitting: Cardiovascular Disease

## 2016-02-20 NOTE — Telephone Encounter (Signed)
Spoke with daughter she states that when the physical therapist was there with the Pt today, she states that was dizzy, confused or disoriented when he started Physical therapy. She states that pt just woke up and that may be part of the reason. Pt has also been lethargic and lying around lately. Daughter has not been taking blood pressure but today the physical therapist was not able to get blood pressure at first(per daughter) but when he was able to get it was 84/60's then after therapy it was 122/80's went over medications with daughter and pt is taking lisinopril 1.25mg  daily and Carvedilol 3.125 BID and the Lipitor 20mg . Pt is taking silodosin S/E are listed as: weakness,dizziness, feeling faint or fatigue, etc... So wondering if this is part of the issue too as well as BP being low. Daughter denies pt having any other symptoms. Please advise

## 2016-02-20 NOTE — Telephone Encounter (Signed)
Spoke with daughter physical therapy [facility] phone number [phone]

## 2016-02-20 NOTE — Telephone Encounter (Signed)
Spoke with Stanton Kidney she states to have pt hold lisinopril in the AM and have urologist office take BP and let us know how it is.  Daughter notified she will hold Lisinporil and talk to the Urologist and call back with his update and what BP was at appointment

## 2016-02-20 NOTE — Telephone Encounter (Signed)
New Message  Danny Oneill, a PT call to get an order for pt to have PT from [facility]. Please call back to discuss

## 2016-02-20 NOTE — Telephone Encounter (Signed)
New Message  Pt call requesting to speak with RN about pts medications. Please call back to discuss

## 2016-02-21 LAB — URINE CULTURE: Culture: >=100000 — AB

## 2016-02-21 NOTE — Telephone Encounter (Signed)
Left detailed message for Physical therapist-spoke with daughter

## 2016-02-21 NOTE — Telephone Encounter (Signed)
Spoke with daughter pt went to urologist thia am and his BP was 112/70 and HR 65 pt stopped lisinopril yesterday, she will continue to hold. Daughter states that pt is having balance issues, SOB, slow respirations, cold all the time and gets winded easily. Denies diaphoresis, Cp, nausea. She further has noticed that pt is breathing abdominally and not with his diaphragm. She states that yesterday with physical therapist his O2 sat before was 95% and 97% after. Appt scheduled with meng Monday 02-24-16

## 2016-02-21 NOTE — Telephone Encounter (Signed)
[phone] 

## 2016-02-22 ENCOUNTER — Telehealth (HOSPITAL_BASED_OUTPATIENT_CLINIC_OR_DEPARTMENT_OTHER): Payer: Self-pay

## 2016-02-22 NOTE — Telephone Encounter (Signed)
Post ED Visit - Positive Culture Follow-up: Successful Patient Follow-Up  Culture assessed and recommendations reviewed by: []  Enzo Bi, Pharm.D. []  Celedonio Miyamoto, Pharm.D., BCPS []  Garvin Fila, Pharm.D. []  Georgina Pillion, Pharm.D., BCPS []  [place], [address].D., BCPS, AAHIVP []  Estella Husk, Pharm.D., BCPS, AAHIVP []  Tennis Must, Pharm.D. []  Sherle Poe, [address].D.  Revonda Standard Masters Pharm D Positive urine culture  [x]  Patient discharged without antimicrobial prescription and treatment is now indicated []  Organism is resistant to prescribed ED discharge antimicrobial []  Patient with positive blood cultures  Changes discussed with ED provider: Melburn Hake [facility] New antibiotic prescription  Amoxicillin 500 mg po BID x 7 days Called to [facility] [phone]  Contacted patient, date 02/22/16, time 1055   Danny Oneill, Danny Oneill 02/22/2016, 10:57 AM

## 2016-02-22 NOTE — Progress Notes (Signed)
ED Antimicrobial Stewardship Positive Culture Follow Up   Danny Oneill is an 82 y.o. male who presented to [name] on 02/18/2016 with a chief complaint of  Chief Complaint  Patient presents with  . Urinary Retention    Recent Results (from the past 720 hour(s))  MRSA PCR Screening     Status: None   Collection Time: 02/02/16 11:22 PM  Result Value Ref Range Status   MRSA by PCR NEGATIVE NEGATIVE Final    Comment:        The GeneXpert MRSA Assay (FDA approved for NASAL specimens only), is one component of a comprehensive MRSA colonization surveillance program. It is not intended to diagnose MRSA infection nor to guide or monitor treatment for MRSA infections.   Urine culture     Status: Abnormal   Collection Time: 02/18/16  9:28 PM  Result Value Ref Range Status   Specimen Description URINE, CLEAN CATCH  Final   Special Requests NONE  Final   Culture >=100,000 COLONIES/mL ENTEROCOCCUS FAECALIS (A)  Final   Report Status 02/21/2016 FINAL  Final   Organism ID, Bacteria ENTEROCOCCUS FAECALIS (A)  Final      Susceptibility   Enterococcus faecalis - MIC*    AMPICILLIN <=2 SENSITIVE Sensitive     LEVOFLOXACIN 0.5 SENSITIVE Sensitive     NITROFURANTOIN <=16 SENSITIVE Sensitive     VANCOMYCIN 1 SENSITIVE Sensitive     * >=100,000 COLONIES/mL ENTEROCOCCUS FAECALIS    []  Treated with , organism resistant to prescribed antimicrobial [x]  Patient discharged originally without antimicrobial agent and treatment is now indicated  New antibiotic prescription: Amoxicillin 500 mg po BID x 7 days  ED Provider: Majel Homer, PA-C   Curly Mackowski, Darl Householder 02/22/2016, 9:10 AM

## 2016-02-24 ENCOUNTER — Inpatient Hospital Stay (HOSPITAL_COMMUNITY): Payer: Medicare HMO

## 2016-02-24 ENCOUNTER — Encounter (HOSPITAL_COMMUNITY): Payer: Self-pay

## 2016-02-24 ENCOUNTER — Other Ambulatory Visit: Payer: Self-pay | Admitting: Physician Assistant

## 2016-02-24 ENCOUNTER — Other Ambulatory Visit: Payer: Medicare HMO | Admitting: *Deleted

## 2016-02-24 ENCOUNTER — Other Ambulatory Visit: Payer: Self-pay

## 2016-02-24 ENCOUNTER — Ambulatory Visit (HOSPITAL_BASED_OUTPATIENT_CLINIC_OR_DEPARTMENT_OTHER): Payer: Medicare HMO

## 2016-02-24 ENCOUNTER — Inpatient Hospital Stay (HOSPITAL_COMMUNITY)
Admission: EM | Admit: 2016-02-24 | Discharge: 2016-03-09 | DRG: 280 | Disposition: A | Discharge status: Home Health | Payer: Medicare HMO | Attending: Cardiovascular Disease | Admitting: Cardiovascular Disease

## 2016-02-24 ENCOUNTER — Ambulatory Visit (INDEPENDENT_AMBULATORY_CARE_PROVIDER_SITE_OTHER): Payer: Self-pay | Admitting: Physician Assistant

## 2016-02-24 ENCOUNTER — Encounter: Payer: Self-pay | Admitting: Physician Assistant

## 2016-02-24 VITALS — BP 109/63 | HR 62 | Ht 74.0 in | Wt 253.0 lb

## 2016-02-24 DIAGNOSIS — R339 Retention of urine, unspecified: Secondary | ICD-10-CM

## 2016-02-24 DIAGNOSIS — I501 Left ventricular failure: Secondary | ICD-10-CM

## 2016-02-24 DIAGNOSIS — Y92230 Patient room in hospital as the place of occurrence of the external cause: Secondary | ICD-10-CM | POA: Diagnosis not present

## 2016-02-24 DIAGNOSIS — E7800 Pure hypercholesterolemia, unspecified: Secondary | ICD-10-CM

## 2016-02-24 DIAGNOSIS — I440 Atrioventricular block, first degree: Secondary | ICD-10-CM | POA: Diagnosis present

## 2016-02-24 DIAGNOSIS — E876 Hypokalemia: Secondary | ICD-10-CM | POA: Diagnosis present

## 2016-02-24 DIAGNOSIS — I3139 Other pericardial effusion (noninflammatory): Secondary | ICD-10-CM

## 2016-02-24 DIAGNOSIS — I2121 ST elevation (STEMI) myocardial infarction involving left circumflex coronary artery: Secondary | ICD-10-CM | POA: Diagnosis not present

## 2016-02-24 DIAGNOSIS — I5023 Acute on chronic systolic (congestive) heart failure: Secondary | ICD-10-CM

## 2016-02-24 DIAGNOSIS — I519 Heart disease, unspecified: Secondary | ICD-10-CM | POA: Diagnosis not present

## 2016-02-24 DIAGNOSIS — I314 Cardiac tamponade: Secondary | ICD-10-CM | POA: Diagnosis not present

## 2016-02-24 DIAGNOSIS — N5089 Other specified disorders of the male genital organs: Secondary | ICD-10-CM | POA: Diagnosis present

## 2016-02-24 DIAGNOSIS — I5043 Acute on chronic combined systolic (congestive) and diastolic (congestive) heart failure: Secondary | ICD-10-CM | POA: Diagnosis present

## 2016-02-24 DIAGNOSIS — R0602 Shortness of breath: Secondary | ICD-10-CM

## 2016-02-24 DIAGNOSIS — I2119 ST elevation (STEMI) myocardial infarction involving other coronary artery of inferior wall: Secondary | ICD-10-CM | POA: Diagnosis present

## 2016-02-24 DIAGNOSIS — I313 Pericardial effusion (noninflammatory): Secondary | ICD-10-CM | POA: Diagnosis not present

## 2016-02-24 DIAGNOSIS — Z79899 Other long term (current) drug therapy: Secondary | ICD-10-CM

## 2016-02-24 DIAGNOSIS — I319 Disease of pericardium, unspecified: Secondary | ICD-10-CM | POA: Diagnosis not present

## 2016-02-24 DIAGNOSIS — E8809 Other disorders of plasma-protein metabolism, not elsewhere classified: Secondary | ICD-10-CM | POA: Diagnosis present

## 2016-02-24 DIAGNOSIS — N390 Urinary tract infection, site not specified: Secondary | ICD-10-CM | POA: Diagnosis present

## 2016-02-24 DIAGNOSIS — E871 Hypo-osmolality and hyponatremia: Secondary | ICD-10-CM

## 2016-02-24 DIAGNOSIS — I480 Paroxysmal atrial fibrillation: Secondary | ICD-10-CM | POA: Diagnosis present

## 2016-02-24 DIAGNOSIS — N183 Chronic kidney disease, stage 3 unspecified: Secondary | ICD-10-CM

## 2016-02-24 DIAGNOSIS — Z77090 Contact with and (suspected) exposure to asbestos: Secondary | ICD-10-CM | POA: Diagnosis present

## 2016-02-24 DIAGNOSIS — E785 Hyperlipidemia, unspecified: Secondary | ICD-10-CM | POA: Diagnosis present

## 2016-02-24 DIAGNOSIS — D649 Anemia, unspecified: Secondary | ICD-10-CM | POA: Diagnosis present

## 2016-02-24 DIAGNOSIS — I509 Heart failure, unspecified: Secondary | ICD-10-CM

## 2016-02-24 DIAGNOSIS — I255 Ischemic cardiomyopathy: Secondary | ICD-10-CM | POA: Diagnosis present

## 2016-02-24 DIAGNOSIS — I951 Orthostatic hypotension: Secondary | ICD-10-CM | POA: Diagnosis present

## 2016-02-24 DIAGNOSIS — I2510 Atherosclerotic heart disease of native coronary artery without angina pectoris: Secondary | ICD-10-CM | POA: Diagnosis present

## 2016-02-24 DIAGNOSIS — Z955 Presence of coronary angioplasty implant and graft: Secondary | ICD-10-CM | POA: Diagnosis not present

## 2016-02-24 DIAGNOSIS — I351 Nonrheumatic aortic (valve) insufficiency: Secondary | ICD-10-CM | POA: Diagnosis present

## 2016-02-24 DIAGNOSIS — N320 Bladder-neck obstruction: Secondary | ICD-10-CM | POA: Diagnosis present

## 2016-02-24 DIAGNOSIS — I4891 Unspecified atrial fibrillation: Secondary | ICD-10-CM | POA: Diagnosis not present

## 2016-02-24 DIAGNOSIS — L270 Generalized skin eruption due to drugs and medicaments taken internally: Secondary | ICD-10-CM | POA: Diagnosis not present

## 2016-02-24 DIAGNOSIS — I071 Rheumatic tricuspid insufficiency: Secondary | ICD-10-CM

## 2016-02-24 DIAGNOSIS — I5041 Acute combined systolic (congestive) and diastolic (congestive) heart failure: Secondary | ICD-10-CM

## 2016-02-24 DIAGNOSIS — Z96651 Presence of right artificial knee joint: Secondary | ICD-10-CM | POA: Diagnosis present

## 2016-02-24 DIAGNOSIS — Z7902 Long term (current) use of antithrombotics/antiplatelets: Secondary | ICD-10-CM

## 2016-02-24 DIAGNOSIS — T462X5A Adverse effect of other antidysrhythmic drugs, initial encounter: Secondary | ICD-10-CM | POA: Diagnosis not present

## 2016-02-24 DIAGNOSIS — Z7982 Long term (current) use of aspirin: Secondary | ICD-10-CM

## 2016-02-24 DIAGNOSIS — G903 Multi-system degeneration of the autonomic nervous system: Secondary | ICD-10-CM

## 2016-02-24 HISTORY — DX: Atrioventricular block, first degree: I44.0

## 2016-02-24 HISTORY — DX: Chronic kidney disease, stage 3 (moderate): N18.3

## 2016-02-24 HISTORY — DX: Paroxysmal atrial fibrillation: I48.0

## 2016-02-24 HISTORY — DX: Other pericardial effusion (noninflammatory): I31.39

## 2016-02-24 HISTORY — DX: Bladder-neck obstruction: N32.0

## 2016-02-24 HISTORY — DX: Chronic kidney disease, stage 3 unspecified: N18.30

## 2016-02-24 HISTORY — DX: Orthostatic hypotension: I95.1

## 2016-02-24 HISTORY — DX: Hypo-osmolality and hyponatremia: E87.1

## 2016-02-24 HISTORY — DX: Dyspnea, unspecified: R06.00

## 2016-02-24 HISTORY — DX: Nonrheumatic aortic (valve) insufficiency: I35.1

## 2016-02-24 HISTORY — DX: Anemia, unspecified: D64.9

## 2016-02-24 HISTORY — DX: Atherosclerotic heart disease of native coronary artery without angina pectoris: I25.10

## 2016-02-24 HISTORY — DX: Pericardial effusion (noninflammatory): I31.3

## 2016-02-24 HISTORY — DX: Ischemic cardiomyopathy: I25.5

## 2016-02-24 HISTORY — DX: Chronic combined systolic (congestive) and diastolic (congestive) heart failure: I50.42

## 2016-02-24 LAB — COMPREHENSIVE METABOLIC PANEL
ALT: 56 U/L — AB (ref 9–46)
AST: 44 U/L — AB (ref 10–35)
Albumin: 3 g/dL — ABNORMAL LOW (ref 3.6–5.1)
Alkaline Phosphatase: 50 U/L (ref 40–115)
BUN: 30 mg/dL — AB (ref 7–25)
CHLORIDE: 99 mmol/L (ref 98–110)
CO2: 23 mmol/L (ref 20–31)
CREATININE: 1.11 mg/dL (ref 0.70–1.11)
Calcium: 8.1 mg/dL — ABNORMAL LOW (ref 8.6–10.3)
Glucose, Bld: 104 mg/dL — ABNORMAL HIGH (ref 65–99)
Potassium: 5.5 mmol/L — ABNORMAL HIGH (ref 3.5–5.3)
SODIUM: 131 mmol/L — AB (ref 135–146)
TOTAL PROTEIN: 5.7 g/dL — AB (ref 6.1–8.1)
Total Bilirubin: 1 mg/dL (ref 0.2–1.2)

## 2016-02-24 LAB — ECHOCARDIOGRAM LIMITED
AVPHT: 605 ms
CHL CUP DOP CALC LVOT VTI: 15.8 cm
CHL CUP MV DEC (S): 208
CHL CUP TV REG PEAK VELOCITY: 263 cm/s
EERAT: 10.71
EWDT: 208 ms
FS: 24 % — AB (ref 28–44)
Height: 74 in
IVS/LV PW RATIO, ED: 0.93
LA ID, A-P, ES: 36 mm
LADIAMINDEX: 1.45 cm/m2
LEFT ATRIUM END SYS DIAM: 36 mm
LV E/e'average: 10.71
LV PW d: 11.1 mm — AB (ref 0.6–1.1)
LV TDI E'LATERAL: 5.48
LV TDI E'MEDIAL: 7.24
LVEEMED: 10.71
LVELAT: 5.48 cm/s
LVOT peak vel: 72.9 cm/s
MV pk E vel: 58.7 m/s
MVPKAVEL: 62.2 m/s
Peak grad: 263 mmHg
RV sys press: 31 mmHg
TRMAXVEL: 263 cm/s
Weight: 4048 oz

## 2016-02-24 LAB — BASIC METABOLIC PANEL
ANION GAP: 9 (ref 5–15)
BUN: 30 mg/dL — ABNORMAL HIGH (ref 6–20)
CALCIUM: 8.3 mg/dL — AB (ref 8.9–10.3)
CO2: 22 mmol/L (ref 22–32)
Chloride: 100 mmol/L — ABNORMAL LOW (ref 101–111)
Creatinine, Ser: 1.32 mg/dL — ABNORMAL HIGH (ref 0.61–1.24)
GFR, EST AFRICAN AMERICAN: 56 mL/min — AB (ref >60)
GFR, EST NON AFRICAN AMERICAN: 49 mL/min — AB (ref >60)
GLUCOSE: 130 mg/dL — AB (ref 65–99)
POTASSIUM: 5.5 mmol/L — AB (ref 3.5–5.1)
SODIUM: 131 mmol/L — AB (ref 135–145)

## 2016-02-24 LAB — CBC
HEMATOCRIT: 35.6 % — AB (ref 39.0–52.0)
HEMOGLOBIN: 11.5 g/dL — AB (ref 13.0–17.0)
MCH: 30.2 pg (ref 26.0–34.0)
MCHC: 32.3 g/dL (ref 30.0–36.0)
MCV: 93.4 fL (ref 78.0–100.0)
Platelets: 273 10*3/uL (ref 150–400)
RBC: 3.81 MIL/uL — AB (ref 4.22–5.81)
RDW: 14.3 % (ref 11.5–15.5)
WBC: 8.5 10*3/uL (ref 4.0–10.5)

## 2016-02-24 LAB — BRAIN NATRIURETIC PEPTIDE: B NATRIURETIC PEPTIDE 5: 745.7 pg/mL — AB (ref 0.0–100.0)

## 2016-02-24 LAB — I-STAT TROPONIN, ED: TROPONIN I, POC: 0.03 ng/mL (ref 0.00–0.08)

## 2016-02-24 MED ORDER — ACETAMINOPHEN 325 MG PO TABS
650.0000 mg | ORAL_TABLET | ORAL | Status: DC | PRN
  Administered 2016-02-28: 650 mg via ORAL
  Filled 2016-02-24: qty 2

## 2016-02-24 MED ORDER — ASPIRIN EC 81 MG PO TBEC
81.0000 mg | DELAYED_RELEASE_TABLET | Freq: Every day | ORAL | Status: DC
Start: 2016-02-25 — End: 2016-03-09
  Administered 2016-02-25 – 2016-03-09 (×14): 81 mg via ORAL
  Filled 2016-02-24 (×14): qty 1

## 2016-02-24 MED ORDER — TICAGRELOR 90 MG PO TABS
90.0000 mg | ORAL_TABLET | Freq: Two times a day (BID) | ORAL | Status: DC
  Administered 2016-02-24 – 2016-03-09 (×28): 90 mg via ORAL
  Filled 2016-02-24 (×28): qty 1

## 2016-02-24 MED ORDER — SODIUM CHLORIDE 0.9 % IV SOLN: 250.0000 mL | INTRAVENOUS | Status: DC | PRN

## 2016-02-24 MED ORDER — SODIUM CHLORIDE 0.9% FLUSH
3.0000 mL | Freq: Two times a day (BID) | INTRAVENOUS | Status: DC
  Administered 2016-02-24 – 2016-03-09 (×23): 3 mL via INTRAVENOUS

## 2016-02-24 MED ORDER — ONDANSETRON HCL 4 MG/2ML IJ SOLN: 4.0000 mg | Freq: Four times a day (QID) | INTRAMUSCULAR | Status: DC | PRN

## 2016-02-24 MED ORDER — FUROSEMIDE 10 MG/ML IJ SOLN
80.0000 mg | Freq: Once | INTRAMUSCULAR | Status: AC
  Administered 2016-02-24: 80 mg via INTRAVENOUS
  Filled 2016-02-24: qty 8

## 2016-02-24 MED ORDER — ATORVASTATIN CALCIUM 20 MG PO TABS
20.0000 mg | ORAL_TABLET | Freq: Every day | ORAL | Status: DC
  Administered 2016-02-25 – 2016-03-08 (×13): 20 mg via ORAL
  Filled 2016-02-24: qty 2
  Filled 2016-02-24 (×11): qty 1
  Filled 2016-02-24: qty 2
  Filled 2016-02-24: qty 1

## 2016-02-24 MED ORDER — FUROSEMIDE 10 MG/ML IJ SOLN
40.0000 mg | Freq: Every day | INTRAMUSCULAR | Status: DC
  Administered 2016-02-25 – 2016-02-27 (×2): 40 mg via INTRAVENOUS
  Filled 2016-02-24 (×3): qty 4

## 2016-02-24 MED ORDER — TAMSULOSIN HCL 0.4 MG PO CAPS
0.4000 mg | ORAL_CAPSULE | Freq: Every day | ORAL | Status: DC
  Administered 2016-02-25 – 2016-03-08 (×13): 0.4 mg via ORAL
  Filled 2016-02-24 (×13): qty 1

## 2016-02-24 MED ORDER — PANTOPRAZOLE SODIUM 40 MG PO TBEC
40.0000 mg | DELAYED_RELEASE_TABLET | Freq: Every day | ORAL | Status: DC
  Administered 2016-02-25 – 2016-03-09 (×13): 40 mg via ORAL
  Filled 2016-02-24 (×14): qty 1

## 2016-02-24 MED ORDER — MELATONIN 3 MG PO TABS
1.0000 | ORAL_TABLET | Freq: Every day | ORAL | Status: DC
  Administered 2016-02-24 – 2016-03-08 (×13): 3 mg via ORAL
  Filled 2016-02-24 (×16): qty 1

## 2016-02-24 MED ORDER — FUROSEMIDE 20 MG PO TABS: 20.0000 mg | ORAL_TABLET | Freq: Every day | ORAL | 3 refills | Status: DC

## 2016-02-24 MED ORDER — SODIUM CHLORIDE 0.9% FLUSH: 3.0000 mL | INTRAVENOUS | Status: DC | PRN

## 2016-02-24 MED ORDER — CARVEDILOL 3.125 MG PO TABS
3.1250 mg | ORAL_TABLET | Freq: Two times a day (BID) | ORAL | Status: DC
  Administered 2016-02-25 – 2016-03-09 (×24): 3.125 mg via ORAL
  Filled 2016-02-24 (×27): qty 1

## 2016-02-24 NOTE — Addendum Note (Signed)
Addended by: Tonita Phoenix on: 02/24/2016 02:09 PM   Modules accepted: Orders

## 2016-02-24 NOTE — ED Notes (Signed)
Placed pt on bedpan, 2 assist. Tolerated well.

## 2016-02-24 NOTE — ED Notes (Signed)
Attempted to call report

## 2016-02-24 NOTE — ED Provider Notes (Signed)
[facility]-EMERGENCY DEPT Provider Note   CSN: [id number] Arrival date & time: 02/24/16  1549     History   Chief Complaint Chief Complaint  Patient presents with  . Shortness of Breath  . Congestive Heart Failure    HPI Danny Oneill is a 82 y.o. male.  HPI Patient was sent to the emergency room to be admitted for congestive heart failure and a pericardial effusion. he was recently admitted to the hospital October 29 of this year for an inferolateral ST elevation MI. He underwent emergent cardiac catheterization and treated with stents. During the patient's hospitalization he also developed urinary retention and Foley catheter was placed.  Patient followed up in the cardiology office today.  He has had persistent shortness of breath and fatigue.  Increasing peripheral edema. As part of his evaluation he had an echocardiogram. The echo showed a large pericardial effusion with some borderline findings to suggest tamponade.  Patient was sent to the emergency room to be admitted to the hospital because they were not able to arrange for a direct admission from the office.  Patient denies any chest pain right now. He denies any shortness of breath at rest. He has noticed increasing peripheral edema.  Past Medical History:  Diagnosis Date  . Arthritis   . Asbestosis (HCC)    "no breathing problems" pt states worked in [employer] for many yrs and was exposed to asbestosis  . Hyperlipidemia   . Lumbar spondylosis   . Myocardial infarction     Patient Active Problem List   Diagnosis Date Noted  . Systolic and diastolic CHF, acute (HCC) 02/13/2016  . Anterior and lateral ST segment elevation (HCC) 02/13/2016  . ARF (acute renal failure) (HCC) 02/13/2016  . Acute urinary retention 02/13/2016  . Hematuria 02/13/2016  . Elevated liver enzymes 02/13/2016  . Bilateral lower extremity edema 02/13/2016  . Low BP 02/13/2016  . Hyperlipidemia 02/13/2016  . Acute ST elevation myocardial  infarction (STEMI) (HCC)   . Acute inferolateral myocardial infarction (HCC) 02/02/2016  . Acute MI, inferolateral wall, initial episode of care (HCC)   . Arterial hypotension   . Overweight (BMI 25.0-29.9) 10/04/2013  . Expected blood loss anemia 10/04/2013  . S/P right TKA 10/03/2013    Past Surgical History:  Procedure Laterality Date  . CARDIAC CATHETERIZATION N/A 02/02/2016   Procedure: Left Heart Cath and Coronary Angiography;  Surgeon: Corky Crafts, MD;  Location: [name] INVASIVE CV LAB;  Service: Cardiovascular;  Laterality: N/A;  . CARDIAC CATHETERIZATION N/A 02/02/2016   Procedure: Coronary Stent Intervention;  Surgeon: Corky Crafts, MD;  Location: [facility] INVASIVE CV LAB;  Service: Cardiovascular;  Laterality: N/A;  . CARDIAC CATHETERIZATION N/A 02/02/2016   Procedure: IABP Insertion;  Surgeon: Corky Crafts, MD;  Location: [facility] INVASIVE CV LAB;  Service: Cardiovascular;  Laterality: N/A;  . CATARACTS REMOVED     BIL  . ROTATOR CUFF REPAIR  2011   L SHOULDER  . TOTAL KNEE ARTHROPLASTY Right 10/03/2013   Procedure: RIGHT TOTAL KNEE ARTHROPLASTY;  Surgeon: [name], MD;  Location: [facility] ORS;  Service: Orthopedics;  Laterality: Right;       Home Medications    Prior to Admission medications   Medication Sig Start Date End Date Taking? Authorizing Provider  amoxicillin (AMOXIL) 500 MG capsule  02/22/16   Historical Provider, MD  aspirin EC 81 MG tablet Take 81 mg by mouth daily with breakfast.     Historical Provider, MD  atorvastatin (LIPITOR) 20 MG tablet  Take 20 mg by mouth daily. 02/17/16   Historical Provider, MD  carvedilol (COREG) 3.125 MG tablet Take 1 tablet (3.125 mg total) by mouth 2 (two) times daily with a meal. 02/10/16   Little Ishikawa, NP  furosemide (LASIX) 20 MG tablet Take 1 tablet (20 mg total) by mouth daily. 02/24/16 05/24/16  Azalee Course, PA  lisinopril (PRINIVIL,ZESTRIL) 2.5 MG tablet Take 1.25 mg by mouth daily.     Historical Provider, MD    Melatonin 5 MG TABS Take 1 tablet by mouth at bedtime.    Historical Provider, MD  pantoprazole (PROTONIX) 40 MG tablet Take 1 tablet (40 mg total) by mouth daily. 02/11/16   Little Ishikawa, NP  silodosin (RAPAFLO) 8 MG CAPS capsule Take 8 mg by mouth daily.    Historical Provider, MD  ticagrelor (BRILINTA) 90 MG TABS tablet Take 1 tablet (90 mg total) by mouth 2 (two) times daily. 02/10/16   Little Ishikawa, NP    Family History Family History  Problem Relation Age of Onset  . Cancer Mother     Social History Social History  Substance Use Topics  . Smoking status: Never Smoker  . Smokeless tobacco: Never Used  . Alcohol use No     Allergies   Morphine and related   Review of Systems Review of Systems  All other systems reviewed and are negative.    Physical Exam Updated Vital Signs BP 129/78   Pulse 67   Temp 97.5 F (36.4 C)   Resp 24   SpO2 99%   Physical Exam  Constitutional: No distress.  HENT:  Head: Normocephalic and atraumatic.  Right Ear: External ear normal.  Left Ear: External ear normal.  Eyes: Conjunctivae are normal. Right eye exhibits no discharge. Left eye exhibits no discharge. No scleral icterus.  Neck: Neck supple. No tracheal deviation present.  Cardiovascular: Normal rate, regular rhythm and intact distal pulses.   Diminished heart sounds  Pulmonary/Chest: Effort normal and breath sounds normal. No stridor. No respiratory distress. He has no wheezes. He has no rales.  Abdominal: Soft. Bowel sounds are normal. He exhibits no distension. There is no tenderness. There is no rebound and no guarding.  Musculoskeletal: He exhibits edema. He exhibits no tenderness.  Pitting edema bilateral lower extremities  Neurological: He is alert. He has normal strength. No cranial nerve deficit (no facial droop, extraocular movements intact, no slurred speech) or sensory deficit. He exhibits normal muscle tone. He displays no seizure activity. Coordination normal.   Skin: Skin is warm and dry. No rash noted. He is not diaphoretic.  Psychiatric: He has a normal mood and affect.  Nursing note and vitals reviewed.    ED Treatments / Results  Labs (all labs ordered are listed, but only abnormal results are displayed) Labs Reviewed  CBC - Abnormal; Notable for the following:       Result Value   RBC 3.81 (*)    Hemoglobin 11.5 (*)    HCT 35.6 (*)    All other components within normal limits  BASIC METABOLIC PANEL  BRAIN NATRIURETIC PEPTIDE  I-STAT TROPOININ, ED    EKG  EKG Interpretation  Date/Time:  Monday February 24 2016 15:54:44 EST Ventricular Rate:  76 PR Interval:    QRS Duration: 70 QT Interval:  408 QTC Calculation: 459 R Axis:   89 Text Interpretation:  Undetermined rhythm Low voltage QRS ST & T wave abnormality, consider inferolateral ischemia Abnormal ECG Artifact Poor data quality in  current ECG precludes serial comparison Confirmed by Brentley Horrell  MD-J, Ilse Billman [phone]([id number]) on 02/24/2016 4:38:53 PM       Procedures Procedures (including critical care time)  Medications Ordered in ED Medications - No data to display   Initial Impression / Assessment and Plan / ED Course  I have reviewed the triage vital signs and the nursing notes.  Pertinent labs & imaging results that were available during my care of the patient were reviewed by me and considered in my medical decision making (see chart for details).  Clinical Course as of Feb 23 1638  Mon Feb 24, 2016  1637 Discussed case with Trish.  Cardiology will be admitting the patient.    [JK]    Clinical Course User Index [JK] Linwood DibblesJon Norine Reddington, MD    Patient remains stable in the emergency room.  Plan on admission  to the hospital for further treatment. We will continue to monitor closely.  Final Clinical Impressions(s) / ED Diagnoses   Final diagnoses:  Acute on chronic congestive heart failure, unspecified congestive heart failure type (HCC)  Pericardial effusion      Linwood DibblesJon Fallou Hulbert,  MD 02/24/16 1642

## 2016-02-24 NOTE — Patient Instructions (Addendum)
Heart failure instruction: 1. Avoid salt 2. Limit daily fluid intake to < 2 L 3. Weigh yourself every morning, call cardiology if weight increase by more than 3 lbs overnight or 5 lbs in a single week.  Medication Instructions:  HOLD- Lisinopril until next office visit START- Lasix 20 mg- Take 40 mg (2 Tablets) for 5 days then take 1 tablet daily  Labwork: BNP and CMP Today  Testing/Procedures: Your physician has requested that you have an echocardiogram. Echocardiography is a painless test that uses sound waves to create images of your heart. It provides your doctor with information about the size and shape of your heart and how well your heart's chambers and valves are working. This procedure takes approximately one hour. There are no restrictions for this procedure.  A chest x-ray takes a picture of the organs and structures inside the chest, including the heart, lungs, and blood vessels. This test can show several things, including, whether the heart is enlarges; whether fluid is building up in the lungs; and whether pacemaker / defibrillator leads are still in place.   Follow-Up: Your physician recommends that you schedule a follow-up appointment in: 1 week with Azalee Course   Any Other Special Instructions Will Be Listed Below (If Applicable).     If you need a refill on your cardiac medications before your next appointment, please call your pharmacy.

## 2016-02-24 NOTE — ED Triage Notes (Signed)
Pt complaining of SOB. Told to present to ED for "fluid around heart." Pt denies any chest pain or cough. Pt states generalized edema.

## 2016-02-24 NOTE — Progress Notes (Signed)
Cardiology Office Note    Date:  02/24/2016   ID:  Jamill Wetmore, DOB 30-Apr-1931, MRN [id number]  PCP:  [name], MD  Cardiologist:  Dr. Tresa Endo (per family request)   Chief Complaint  Patient presents with  . Follow-up    seen for Dr. Tresa Endo, weight gain    History of Present Illness:  Rayven Hendrickson is a 82 y.o. male with PMH of Right he arthroplasty and asbestosis exposure was recently admitted to [facility] on 02/02/2016 with inferolateral STEMI. He underwent urgent cardiac catheterization on 02/02/2016 which showed 50% ostial RCA lesion, 40% ostial left main lesion, 70% ostial D1 lesion, 100% ostial OM 2 occlusion treated with 2.25 x 16 mm DES. He was hypotensive in the Cath Lab, balloon pump was placed, however stressor was initiated unless his BP drop significantly. Echocardiogram obtained on 02/03/2016 which showed EF 35-40%, akinesis of the inferior myocardium, moderate AR. His balloon pump was discontinued on 10/31. Post procedure, his EKG showed persistent ST elevation. According to family history, he actually had symptom several days prior to arrival. Post procedure, he was placed on carvedilol, lisinopril, Lipitor, aspirin and Brilinta. He was evaluated by physical therapy, who recommended skilled nursing facility on discharge. He also has some elevated LFT as well, Lipitor was held pending return of liver function test back to normal.. Hemoglobin was 5.9 which was prediabetes range. During this hospitalization, he did have urinary retention requiring Foley placement. Urology was consulted, and he was discharged with a Foley and with plan to follow-up with urology in a week after discharge. He also had some hematuria likely due to trauma of Foley insertion. Since discharge, he did go back to the emergency room again for urinary retention. Based on nursing home note, his lisinopril was held due to hypotension.   Patient presents today for cardiology office evaluation. He  has gained roughly 30 pounds since his last visit. He has 3-4+ pitting edema on physical exam. His scrotum is severely edematous. He does have a Foley attached to his leg that was put in by urology. He does have a UTI and was recently started on amoxicillin. EKG today showed diffuse low voltage throughout. Fortunately, at rest, his O2 saturation is 98%. He admitted to have abdominal distention, orthopnea and paroxysmal nocturnal dyspnea in the last few days. His albumin was very low on recent lab work, family has been giving him Ensure. I will start him on Lasix 40 mg daily 5 days, then down to 20 mg daily. Hold lisinopril for now. Follow-up in one week. Stat limited echo to rule out pericardial effusion and reassess ejection fraction. Given his shortness breath, I will also obtain two-view chest x-ray, BNP and CMP. Note patient has a severely low albumin level which likely also contribute to fluid overload.    Past Medical History:  Diagnosis Date  . Arthritis   . Asbestosis (HCC)    "no breathing problems" pt states worked in [employer] for many yrs and was exposed to asbestosis  . Hyperlipidemia   . Lumbar spondylosis   . Myocardial infarction     Past Surgical History:  Procedure Laterality Date  . CARDIAC CATHETERIZATION N/A 02/02/2016   Procedure: Left Heart Cath and Coronary Angiography;  Surgeon: Corky Crafts, MD;  Location: [facility] INVASIVE CV LAB;  Service: Cardiovascular;  Laterality: N/A;  . CARDIAC CATHETERIZATION N/A 02/02/2016   Procedure: Coronary Stent Intervention;  Surgeon: Corky Crafts, MD;  Location: [name] INVASIVE CV LAB;  Service:  Cardiovascular;  Laterality: N/A;  . CARDIAC CATHETERIZATION N/A 02/02/2016   Procedure: IABP Insertion;  Surgeon: Corky Crafts, MD;  Location: [facility] INVASIVE CV LAB;  Service: Cardiovascular;  Laterality: N/A;  . CATARACTS REMOVED     BIL  . ROTATOR CUFF REPAIR  2011   L SHOULDER  . TOTAL KNEE ARTHROPLASTY Right 10/03/2013    Procedure: RIGHT TOTAL KNEE ARTHROPLASTY;  Surgeon: [name], MD;  Location: [facility] ORS;  Service: Orthopedics;  Laterality: Right;    Current Medications: Outpatient Medications Prior to Visit  Medication Sig Dispense Refill  . aspirin EC 81 MG tablet Take 81 mg by mouth daily with breakfast.     . atorvastatin (LIPITOR) 20 MG tablet Take 20 mg by mouth daily.    . carvedilol (COREG) 3.125 MG tablet Take 1 tablet (3.125 mg total) by mouth 2 (two) times daily with a meal. 60 tablet 12  . lisinopril (PRINIVIL,ZESTRIL) 2.5 MG tablet Take 1.25 mg by mouth daily.     . Melatonin 5 MG TABS Take 1 tablet by mouth at bedtime.    . pantoprazole (PROTONIX) 40 MG tablet Take 1 tablet (40 mg total) by mouth daily. 30 tablet 12  . silodosin (RAPAFLO) 8 MG CAPS capsule Take 8 mg by mouth daily.    . ticagrelor (BRILINTA) 90 MG TABS tablet Take 1 tablet (90 mg total) by mouth 2 (two) times daily. 60 tablet 12   No facility-administered medications prior to visit.      Allergies:   Morphine and related   Social History   Social History  . Marital status: Married    Spouse name: N/A  . Number of children: N/A  . Years of education: N/A   Social History Main Topics  . Smoking status: Never Smoker  . Smokeless tobacco: Never Used  . Alcohol use No  . Drug use: No  . Sexual activity: Not Asked   Other Topics Concern  . None   Social History Narrative  . None     Family History:  The patient's family history includes Cancer in his mother.   ROS:   Please see the history of present illness.    ROS All other systems reviewed and are negative.   PHYSICAL EXAM:   VS:  BP 109/63   Pulse 62   Ht 6\' 2"  (1.88 m)   Wt 253 lb (114.8 kg)   BMI 32.48 kg/m    GEN: Well nourished, well developed, in no acute distress  HEENT: normal  Neck: no JVD, carotid bruits, or masses Cardiac: RRR; no murmurs, rubs, or gallops,no edema  Respiratory:  clear to auscultation bilaterally, normal work of  breathing GI: soft, nontender, nondistended, + BS MS: no deformity or atrophy  Skin: warm and dry, no rash Neuro:  Alert and Oriented x 3, Strength and sensation are intact Psych: euthymic mood, full affect  Wt Readings from Last 3 Encounters:  02/24/16 253 lb (114.8 kg)  02/18/16 230 lb (104.3 kg)  02/17/16 233 lb 9.6 oz (106 kg)      Studies/Labs Reviewed:   EKG:  EKG is ordered today.  The ekg ordered today demonstrates Sinus rhythm with low voltage.  Recent Labs: 02/03/2016: B Natriuretic Peptide 227.8 02/17/2016: ALT 59; Platelets 318 02/18/2016: BUN 36; Creatinine, Ser 1.20; Hemoglobin 11.2; Potassium 4.7; Sodium 126   Lipid Panel    Component Value Date/Time   CHOL 131 02/02/2016 2318   TRIG 61 02/02/2016 2318   HDL 44  02/02/2016 2318   CHOLHDL 3.0 02/02/2016 2318   VLDL 12 02/02/2016 2318   LDLCALC 75 02/02/2016 2318    Additional studies/ records that were reviewed today include:   Cath 02/02/2016 Conclusion     Ost RCA lesion, 50 %stenosed. Mild pressure dampening with catheter engagement.  Ost LM lesion, 40 %stenosed.  Ost 1st Diag to 1st Diag lesion, 70 %stenosed.  LV end diastolic pressure is moderately elevated.  There is no aortic valve stenosis.  Mid LAD lesion, 10 %stenosed.  Ost 2nd Mrg to 2nd Mrg lesion, 100 %stenosed. A STENT SYNERGY DES 2.25X16 drug eluting stent was successfully placed.  Post intervention, there is a 0% residual stenosis.  Lat 2nd Mrg lesion, 80 %stenosed. The lesion is at the origin of a branch at the bifurcation distal to the stent.   Continue dual antiplatelet therapy for ideally a year. He'll need aggressive secondary prevention including lipid-lowering therapy.  Patient with hypotension in the Cath Lab. Balloon pump placed given that he has some renal insufficiency and LVEDP was already mildly elevated. Her options were limited. I did not want to start pressors if they could be avoided given that his heart rate  is on the higher and and he already has renal insufficiency. Hopefully, the balloon pump can be removed tomorrow if his pressure has stabilized.  Will obtain echocardiogram to evaluate left ventricular function.    The patient denied any prior symptoms of angina before the past 2 days. Therefore, would not plan any routine elective angioplasty on this patient of his other moderate disease.  Continue aggressive medical therapy.    Echo 02/03/2016 LV EF: 35% -   40%  - Left ventricle: Wall thickness was increased in a pattern of   moderate LVH. Systolic function was moderately reduced. The   estimated ejection fraction was in the range of 35% to 40%.   Akinesis of the inferior myocardium. Doppler parameters are   consistent with abnormal left ventricular relaxation (grade 1   diastolic dysfunction). - Aortic valve: There was moderate regurgitation. - Right ventricle: The cavity size was moderately decreased.   ASSESSMENT:    1. Acute on chronic systolic heart failure (HCC)   2. Pericardial effusion   3. LVF (left ventricular failure) (HCC)   4. SOB (shortness of breath)   5. Hypoalbuminemia   6. Cardiomyopathy, ischemic      PLAN:  In order of problems listed above:  1. Acute on chronic systolic heart failure: He is 30 pounds over his baseline, severely fluid overloaded. She planned for outpatient diuresis, however later informed by echo tach that he his stat echo showed large pericardial effusion was borderline tamponade, will admit the patient instead for IV diuresis. Partially attributed by severe hypoalbuminemia.  2. Large pericardial effusion: Informed of results by Dr. Royann Shivers, discussed with DOB Dr. Johney Frame, plan for admission. May need thoracentesis if does not improve with IV diuresis.  3. ICM and CAD s/p DES to OM: No obvious angina.    Medication Adjustments/Labs and Tests Ordered: Current medicines are reviewed at length with the patient today.  Concerns regarding  medicines are outlined above.  Medication changes, Labs and Tests ordered today are listed in the Patient Instructions below. Patient Instructions  Heart failure instruction: 1. Avoid salt 2. Limit daily fluid intake to < 2 L 3. Weigh yourself every morning, call cardiology if weight increase by more than 3 lbs overnight or 5 lbs in a single week.  Medication Instructions:  HOLD-  Lisinopril until next office visit START- Lasix 20 mg- Take 40 mg (2 Tablets) for 5 days then take 1 tablet daily  Labwork: BNP and CMP Today  Testing/Procedures: Your physician has requested that you have an echocardiogram. Echocardiography is a painless test that uses sound waves to create images of your heart. It provides your doctor with information about the size and shape of your heart and how well your heart's chambers and valves are working. This procedure takes approximately one hour. There are no restrictions for this procedure.  A chest x-ray takes a picture of the organs and structures inside the chest, including the heart, lungs, and blood vessels. This test can show several things, including, whether the heart is enlarges; whether fluid is building up in the lungs; and whether pacemaker / defibrillator leads are still in place.   Follow-Up: Your physician recommends that you schedule a follow-up appointment in: 1 week with Azalee CourseHao Joaopedro Eschbach   Any Other Special Instructions Will Be Listed Below (If Applicable).     If you need a refill on your cardiac medications before your next appointment, please call your pharmacy.      Ramond DialSigned, Serita Degroote, [place]PA  02/24/2016 3:11 PM    [facility] [address], [place], [place]  [address] Phone: [phone]; Fax: [phone]    I have seen, examined the patient, and reviewed the above assessment and plan.  Pt is clinically quite ill.  Large pericardial effusion with impending tamponade is noted.  Vitals however are stable.  Will admit  to cardiology for aggressive evaluation/ management of acute on chronic decompensated CHF and pericardial effusion.Marland Kitchen.   He wishes for CPR if required but does not wish for prolonged intubation should this be necessary.   I worry that he will require pericardiocentesis.    This patient is clinically quite ill and at risk for decompensation/ death.  Co Sign: Hillis RangeJames Allred, MD 02/24/2016 9:45 PM

## 2016-02-24 NOTE — H&P (Signed)
Cardiology Office Note    Date:  02/24/2016   ID:  Danny Oneill, DOB 07-02-1931, MRN [id number]  PCP:  [name], MD   Cardiologist:  Dr. Tresa Endo (per family request)       Chief Complaint  Patient presents with  . Follow-up    seen for Dr. Tresa Endo, weight gain    History of Present Illness:  Danny Oneill is a 82 y.o. male with PMH of Right he arthroplasty and asbestosis exposure was recently admitted to [facility] on 02/02/2016 with inferolateral STEMI. He underwent urgent cardiac catheterization on 02/02/2016 which showed 50% ostial RCA lesion, 40% ostial left main lesion, 70% ostial D1 lesion, 100% ostial OM 2 occlusion treated with 2.25 x 16 mm DES. He was hypotensive in the Cath Lab, balloon pump was placed, however stressor was initiated unless his BP drop significantly. Echocardiogram obtained on 02/03/2016 which showed EF 35-40%, akinesis of the inferior myocardium, moderate AR. His balloon pump was discontinued on 10/31. Post procedure, his EKG showed persistent ST elevation. According to family history, he actually had symptom several days prior to arrival. Post procedure, he was placed on carvedilol, lisinopril, Lipitor, aspirin and Brilinta. He was evaluated by physical therapy, who recommended skilled nursing facility on discharge. He also has some elevated LFT as well, Lipitor was held pending return of liver function test back to normal.. Hemoglobin was 5.9 which was prediabetes range. During this hospitalization, he did have urinary retention requiring Foley placement. Urology was consulted, and he was discharged with a Foley and with plan to follow-up with urology in a week after discharge. He also had some hematuria likely due to trauma of Foley insertion. Since discharge, he did go back to the emergency room again for urinary retention. Based on nursing home note, his lisinopril was held due to hypotension.   Patient presents today for cardiology office  evaluation. He has gained roughly 30 pounds since his last visit. He has 3-4+ pitting edema on physical exam. His scrotum is severely edematous. He does have a Foley attached to his leg that was put in by urology. He does have a UTI and was recently started on amoxicillin. EKG today showed diffuse low voltage throughout. Fortunately, at rest, his O2 saturation is 98%. He admitted to have abdominal distention, orthopnea and paroxysmal nocturnal dyspnea in the last few days. His albumin was very low on recent lab work, family has been giving him Ensure. I will start him on Lasix 40 mg daily 5 days, then down to 20 mg daily. Hold lisinopril for now. Follow-up in one week. Stat limited echo to rule out pericardial effusion and reassess ejection fraction. Given his shortness breath, I will also obtain two-view chest x-ray, BNP and CMP. Note patient has a severely low albumin level which likely also contribute to fluid overload.        Past Medical History:  Diagnosis Date  . Arthritis   . Asbestosis (HCC)    "no breathing problems" pt states worked in [employer] for many yrs and was exposed to asbestosis  . Hyperlipidemia   . Lumbar spondylosis   . Myocardial infarction          Past Surgical History:  Procedure Laterality Date  . CARDIAC CATHETERIZATION N/A 02/02/2016   Procedure: Left Heart Cath and Coronary Angiography;  Surgeon: Corky Crafts, MD;  Location: [facility] INVASIVE CV LAB;  Service: Cardiovascular;  Laterality: N/A;  . CARDIAC CATHETERIZATION N/A 02/02/2016   Procedure: Coronary Stent Intervention;  Surgeon: Corky Crafts, MD;  Location: [facility] INVASIVE CV LAB;  Service: Cardiovascular;  Laterality: N/A;  . CARDIAC CATHETERIZATION N/A 02/02/2016   Procedure: IABP Insertion;  Surgeon: Corky Crafts, MD;  Location: [facility] INVASIVE CV LAB;  Service: Cardiovascular;  Laterality: N/A;  . CATARACTS REMOVED     BIL  . ROTATOR CUFF REPAIR  2011   L SHOULDER  .  TOTAL KNEE ARTHROPLASTY Right 10/03/2013   Procedure: RIGHT TOTAL KNEE ARTHROPLASTY;  Surgeon: [name], MD;  Location: [facility] ORS;  Service: Orthopedics;  Laterality: Right;    Current Medications:       Outpatient Medications Prior to Visit  Medication Sig Dispense Refill  . aspirin EC 81 MG tablet Take 81 mg by mouth daily with breakfast.     . atorvastatin (LIPITOR) 20 MG tablet Take 20 mg by mouth daily.    . carvedilol (COREG) 3.125 MG tablet Take 1 tablet (3.125 mg total) by mouth 2 (two) times daily with a meal. 60 tablet 12  . lisinopril (PRINIVIL,ZESTRIL) 2.5 MG tablet Take 1.25 mg by mouth daily.     . Melatonin 5 MG TABS Take 1 tablet by mouth at bedtime.    . pantoprazole (PROTONIX) 40 MG tablet Take 1 tablet (40 mg total) by mouth daily. 30 tablet 12  . silodosin (RAPAFLO) 8 MG CAPS capsule Take 8 mg by mouth daily.    . ticagrelor (BRILINTA) 90 MG TABS tablet Take 1 tablet (90 mg total) by mouth 2 (two) times daily. 60 tablet 12   No facility-administered medications prior to visit.      Allergies:   Morphine and related   Social History        Social History  . Marital status: Married    Spouse name: N/A  . Number of children: N/A  . Years of education: N/A       Social History Main Topics  . Smoking status: Never Smoker  . Smokeless tobacco: Never Used  . Alcohol use No  . Drug use: No  . Sexual activity: Not Asked       Other Topics Concern  . None      Social History Narrative  . None     Family History:  The patient's family history includes Cancer in his mother.   ROS:   Please see the history of present illness.    ROS All other systems reviewed and are negative.   PHYSICAL EXAM:   VS:  BP 109/63   Pulse 62   Ht 6\' 2"  (1.88 m)   Wt 253 lb (114.8 kg)   BMI 32.48 kg/m    GEN: Well nourished, well developed, in no acute distress  HEENT: normal  Neck: no JVD, carotid bruits, or masses Cardiac: RRR; no  murmurs, rubs, or gallops,no edema  Respiratory:  clear to auscultation bilaterally, normal work of breathing GI: soft, nontender, nondistended, + BS MS: no deformity or atrophy  Skin: warm and dry, no rash Neuro:  Alert and Oriented x 3, Strength and sensation are intact Psych: euthymic mood, full affect     Wt Readings from Last 3 Encounters:  02/24/16 253 lb (114.8 kg)  02/18/16 230 lb (104.3 kg)  02/17/16 233 lb 9.6 oz (106 kg)      Studies/Labs Reviewed:   EKG:  EKG is ordered today.  The ekg ordered today demonstrates Sinus rhythm with low voltage.  Recent Labs: 02/03/2016: B Natriuretic Peptide 227.8 02/17/2016: ALT 59; Platelets 318  02/18/2016: BUN 36; Creatinine, Ser 1.20; Hemoglobin 11.2; Potassium 4.7; Sodium 126   Lipid Panel Labs (Brief)          Component Value Date/Time   CHOL 131 02/02/2016 2318   TRIG 61 02/02/2016 2318   HDL 44 02/02/2016 2318   CHOLHDL 3.0 02/02/2016 2318   VLDL 12 02/02/2016 2318   LDLCALC 75 02/02/2016 2318      Additional studies/ records that were reviewed today include:   Cath 02/02/2016 Conclusion     Ost RCA lesion, 50 %stenosed. Mild pressure dampening with catheter engagement.  Ost LM lesion, 40 %stenosed.  Ost 1st Diag to 1st Diag lesion, 70 %stenosed.  LV end diastolic pressure is moderately elevated.  There is no aortic valve stenosis.  Mid LAD lesion, 10 %stenosed.  Ost 2nd Mrg to 2nd Mrg lesion, 100 %stenosed. A STENT SYNERGY DES 2.25X16 drug eluting stent was successfully placed.  Post intervention, there is a 0% residual stenosis.  Lat 2nd Mrg lesion, 80 %stenosed. The lesion is at the origin of a branch at the bifurcation distal to the stent.  Continue dual antiplatelet therapy for ideally a year. He'll need aggressive secondary prevention including lipid-lowering therapy.  Patient with hypotension in the Cath Lab. Balloon pump placed given that he has some renal insufficiency and  LVEDP was already mildly elevated. Her options were limited. I did not want to start pressors if they could be avoided given that his heart rate is on the higher and and he already has renal insufficiency. Hopefully, the balloon pump can be removed tomorrow if his pressure has stabilized. Will obtain echocardiogram to evaluate left ventricular function.   The patient denied any prior symptoms of angina before the past 2 days. Therefore, would not plan any routine elective angioplasty on this patient of his other moderate disease. Continue aggressive medical therapy.    Echo 02/03/2016 LV EF: 35% - 40%  - Left ventricle: Wall thickness was increased in a pattern of moderate LVH. Systolic function was moderately reduced. The estimated ejection fraction was in the range of 35% to 40%. Akinesis of the inferior myocardium. Doppler parameters are consistent with abnormal left ventricular relaxation (grade 1 diastolic dysfunction). - Aortic valve: There was moderate regurgitation. - Right ventricle: The cavity size was moderately decreased.   ASSESSMENT:    1. Acute on chronic systolic heart failure (HCC)   2. Pericardial effusion   3. LVF (left ventricular failure) (HCC)   4. SOB (shortness of breath)   5. Hypoalbuminemia   6. Cardiomyopathy, ischemic      PLAN:  In order of problems listed above:  1. Acute on chronic systolic heart failure: He is 30 pounds over his baseline, severely fluid overloaded. She planned for outpatient diuresis, however later informed by echo tach that he his stat echo showed large pericardial effusion was borderline tamponade, will admit the patient instead for IV diuresis. Partially attributed by severe hypoalbuminemia.  2. Large pericardial effusion: Informed of results by Dr. Royann Shivers, discussed with DOB Dr. Johney Frame, plan for admission. May need thoracentesis if does not improve with IV diuresis.  3. ICM and CAD s/p DES to OM: No  obvious angina.    Medication Adjustments/Labs and Tests Ordered: Current medicines are reviewed at length with the patient today.  Concerns regarding medicines are outlined above.  Medication changes, Labs and Tests ordered today are listed in the Patient Instructions below. Patient Instructions  Heart failure instruction: 1. Avoid salt 2. Limit daily fluid intake  to < 2 L 3. Weigh yourself every morning, call cardiology if weight increase by more than 3 lbs overnight or 5 lbs in a single week.  Medication Instructions:  HOLD- Lisinopril until next office visit START- Lasix 20 mg- Take 40 mg (2 Tablets) for 5 days then take 1 tablet daily  Labwork: BNP and CMP Today  Testing/Procedures: Your physician has requested that you have an echocardiogram. Echocardiography is a painless test that uses sound waves to create images of your heart. It provides your doctor with information about the size and shape of your heart and how well your heart's chambers and valves are working. This procedure takes approximately one hour. There are no restrictions for this procedure.  A chest x-ray takes a picture of the organs and structures inside the chest, including the heart, lungs, and blood vessels. This test can show several things, including, whether the heart is enlarges; whether fluid is building up in the lungs; and whether pacemaker / defibrillator leads are still in place.   Follow-Up: Your physician recommends that you schedule a follow-up appointment in: 1 week with Azalee CourseHao Meng   Any Other Special Instructions Will Be Listed Below (If Applicable).     If you need a refill on your cardiac medications before your next appointment, please call your pharmacy.      Ramond DialSigned, Hao Meng, GeorgiaPA  02/24/2016 3:11 PM    [facility] [address], [place], [place]  [address] Phone: [phone]; Fax: [phone]   Agree with note by Azalee CourseHao Meng PA-C  Mr.  Zebrowski was seen in the office today by Azalee CourseHao Meng PA-C. He had an inferolateral myocardial infarction/STEMI' 3 weeks ago and had a drug-eluting stent placed in the second obtuse marginal branch. His EF at that time was 35-40%. He was just discharged home on 02/11/16 and has been getting progressively short of breath. Here [facility] today that showed an EF of 35% with a moderate free flowing. Pericardial effusion and mild the collapse consistent with early temporal physiology. He clearly has 2-3+ pitting edema and bibasilar crackles. 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 On exam he's got crackles or spaces and increased JVD) pressure stable and he is not tachycardic. I believe he will respond IV diuretics. Continue give him 80 mg Lasix IV followed by 40 mg IV twice a day. We will follow his volume status and his vital signs as well as his 2-D echo.  Runell GessJonathan J. Brayli Klingbeil, M.D., FACP, [facility]ACC, Earl LagosFAHA, [facility]SC[facility] [address] [place], [place]  [address]  [phone] 02/24/2016 5:23 PM

## 2016-02-24 NOTE — Progress Notes (Signed)
He has amoxicillin in his medication list used for recent UTI, morning rounding team need to check with patient how many days left of amoxicillin he needs to take to finish a course.   Ramond Dial PA Pager: [phone]

## 2016-02-25 ENCOUNTER — Telehealth: Payer: Self-pay | Admitting: Cardiovascular Disease

## 2016-02-25 DIAGNOSIS — I519 Heart disease, unspecified: Secondary | ICD-10-CM

## 2016-02-25 DIAGNOSIS — I2121 ST elevation (STEMI) myocardial infarction involving left circumflex coronary artery: Secondary | ICD-10-CM

## 2016-02-25 LAB — BASIC METABOLIC PANEL
Anion gap: 8 (ref 5–15)
BUN: 29 mg/dL — AB (ref 6–20)
CALCIUM: 8 mg/dL — AB (ref 8.9–10.3)
CHLORIDE: 99 mmol/L — AB (ref 101–111)
CO2: 25 mmol/L (ref 22–32)
Creatinine, Ser: 1.18 mg/dL (ref 0.61–1.24)
GFR calc Af Amer: >60 mL/min (ref >60)
GFR, EST NON AFRICAN AMERICAN: 56 mL/min — AB (ref >60)
Glucose, Bld: 109 mg/dL — ABNORMAL HIGH (ref 65–99)
Potassium: 4.3 mmol/L (ref 3.5–5.1)
SODIUM: 132 mmol/L — AB (ref 135–145)

## 2016-02-25 LAB — MRSA PCR SCREENING: MRSA BY PCR: NEGATIVE

## 2016-02-25 LAB — BRAIN NATRIURETIC PEPTIDE: BRAIN NATRIURETIC PEPTIDE: 552.7 pg/mL — AB (ref <100)

## 2016-02-25 MED ORDER — HEPARIN SODIUM (PORCINE) 5000 UNIT/ML IJ SOLN
5000.0000 [IU] | Freq: Three times a day (TID) | INTRAMUSCULAR | Status: DC
  Administered 2016-02-25 – 2016-02-26 (×4): 5000 [IU] via SUBCUTANEOUS
  Filled 2016-02-25 (×4): qty 1

## 2016-02-25 NOTE — Progress Notes (Addendum)
Subjective:  Pt feeling somewhat better this AM with less labored breathing. I/O -3.6 L  Objective:  Temp:  [97.3 F (36.3 C)-98.3 F (36.8 C)] 98.3 F (36.8 C) (11/21 0607) Pulse Rate:  [61-70] 63 (11/21 0607) Resp:  [16-24] 18 (11/21 0607) BP: (95-129)/(60-91) 98/70 (11/21 0607) SpO2:  [98 %-100 %] 98 % (11/21 0607) Weight:  [245 lb 14.4 oz (111.5 kg)-253 lb (114.8 kg)] 245 lb 14.4 oz (111.5 kg) (11/21 8841) Weight change:   Intake/Output from previous day: 11/20 0701 - 11/21 0700 In: 240 [P.O.:240] Out: 3850 [Urine:3850]  Intake/Output from this shift: No intake/output data recorded.  Physical Exam: General appearance: alert and no distress Neck: no adenopathy, no carotid bruit, supple, symmetrical, trachea midline, thyroid not enlarged, symmetric, no tenderness/mass/nodules and Elevated JVP Lungs: clear to auscultation bilaterally Heart: regular rate and rhythm, S1, S2 normal, no murmur, click, rub or gallop Extremities: 2-3+ pitting BLE edema  Lab Results: Results for orders placed or performed during the hospital encounter of 02/24/16 (from the past 48 hour(s))  Basic metabolic panel     Status: Abnormal   Collection Time: 02/24/16  4:10 PM  Result Value Ref Range   Sodium 131 (L) 135 - 145 mmol/L   Potassium 5.5 (H) 3.5 - 5.1 mmol/L   Chloride 100 (L) 101 - 111 mmol/L   CO2 22 22 - 32 mmol/L   Glucose, Bld 130 (H) 65 - 99 mg/dL   BUN 30 (H) 6 - 20 mg/dL   Creatinine, Ser 1.32 (H) 0.61 - 1.24 mg/dL   Calcium 8.3 (L) 8.9 - 10.3 mg/dL   GFR calc non Af Amer 49 (L) >60 mL/min   GFR calc Af Amer 56 (L) >60 mL/min    Comment: (NOTE) The eGFR has been calculated using the CKD EPI equation. This calculation has not been validated in all clinical situations. eGFR's persistently <60 mL/min signify possible Chronic Kidney Disease.    Anion gap 9 5 - 15  CBC     Status: Abnormal   Collection Time: 02/24/16  4:10 PM  Result Value Ref Range   WBC 8.5 4.0 -  10.5 K/uL   RBC 3.81 (L) 4.22 - 5.81 MIL/uL   Hemoglobin 11.5 (L) 13.0 - 17.0 g/dL   HCT 35.6 (L) 39.0 - 52.0 %   MCV 93.4 78.0 - 100.0 fL   MCH 30.2 26.0 - 34.0 pg   MCHC 32.3 30.0 - 36.0 g/dL   RDW 14.3 11.5 - 15.5 %   Platelets 273 150 - 400 K/uL  Brain natriuretic peptide     Status: Abnormal   Collection Time: 02/24/16  4:10 PM  Result Value Ref Range   B Natriuretic Peptide 745.7 (H) 0.0 - 100.0 pg/mL  I-stat troponin, ED     Status: None   Collection Time: 02/24/16  4:25 PM  Result Value Ref Range   Troponin i, poc 0.03 0.00 - 0.08 ng/mL   Comment 3            Comment: Due to the release kinetics of cTnI, a negative result within the first hours of the onset of symptoms does not rule out myocardial infarction with certainty. If myocardial infarction is still suspected, repeat the test at appropriate intervals.   MRSA PCR Screening     Status: None   Collection Time: 02/24/16  9:10 PM  Result Value Ref Range   MRSA by PCR NEGATIVE NEGATIVE    Comment:  The GeneXpert MRSA Assay (FDA approved for NASAL specimens only), is one component of a comprehensive MRSA colonization surveillance program. It is not intended to diagnose MRSA infection nor to guide or monitor treatment for MRSA infections.   Basic metabolic panel     Status: Abnormal   Collection Time: 02/25/16  4:43 AM  Result Value Ref Range   Sodium 132 (L) 135 - 145 mmol/L   Potassium 4.3 3.5 - 5.1 mmol/L    Comment: DELTA CHECK NOTED   Chloride 99 (L) 101 - 111 mmol/L   CO2 25 22 - 32 mmol/L   Glucose, Bld 109 (H) 65 - 99 mg/dL   BUN 29 (H) 6 - 20 mg/dL   Creatinine, Ser 1.18 0.61 - 1.24 mg/dL   Calcium 8.0 (L) 8.9 - 10.3 mg/dL   GFR calc non Af Amer 56 (L) >60 mL/min   GFR calc Af Amer >60 >60 mL/min    Comment: (NOTE) The eGFR has been calculated using the CKD EPI equation. This calculation has not been validated in all clinical situations. eGFR's persistently <60 mL/min signify possible  Chronic Kidney Disease.    Anion gap 8 5 - 15    Imaging: Imaging results have been reviewed  Tele- NSR in 60s  Assessment/Plan:   1. Active Problems: 2.   Acute on chronic systolic heart failure ([place]) 3. LV dysfunction 4. Moderate pericardial effusion 5. BOO with indwelling foley  Time Spent Directly with Patient:  30 minutes  Length of Stay:  LOS: 1 day   Pt admitted with systolic HF and moderate circumferential pericardial effusion with early tamponade physiology. Pt's breathing is less labored. I/O neg 3.6 L with IV diuresis. Still has 2-3 + pitting edema. He has indwelling foley placed for BOO and is apparently on amoxacillin for UTI. Will have Urology address [facility] Rx.  Renal Fxn and lytes. OK. VSS. HR in 60s. He does have increased JVP.  Otherwise on approp meds including DAPT for recent STEMI and DES OM2. Will continue to diurese, recheck 2D later this week. Hopefully pericardial effusion will diminish with diuresis. If not will need pericardiocentesis.   Quay Burow 02/25/2016, 8:09 AM

## 2016-02-25 NOTE — Evaluation (Signed)
Physical Therapy Evaluation Patient Details Name: Danny Oneill MRN: [id number] DOB: [birth year] Today's Date: 02/25/2016   History of Present Illness  Patient is an 82 y/o male with hx of STEMI, Rt TKA and HLD presents with 30 lb weight gain, abdominal distention, orthopnea and paroxysmal nocturnal dyspnea. Found to have acute on chronic CHF.   Clinical Impression  Patient presents with generalized weakness, deconditioning, dyspnea on exertion and impaired balance s/p above. Requires assist to stand especially from low surfaces due to being tall and having long legs. Tolerated gait training with Min guard assist for safety and had a drop in Sp02 with mobility to 80s. Pt just d/ced from SNF 1 week ago and has been getting HHPT so recommend continued [facility] services. Will follow acutely to maximize independence and mobility prior to return home. If pt does not progress, may need to consider extra help at home vs return to SNF.    Follow Up Recommendations Home health PT;Supervision for mobility/OOB;Supervision/Assistance - 24 hour    Equipment Recommendations  None recommended by PT    Recommendations for Other Services       Precautions / Restrictions Precautions Precautions: Fall Restrictions Weight Bearing Restrictions: No      Mobility  Bed Mobility Overal bed mobility: Needs Assistance Bed Mobility: Supine to Sit     Supine to sit: Min assist;HOB elevated     General bed mobility comments: Assist with trunk to get to EOB.   Transfers Overall transfer level: Needs assistance Equipment used: Rolling walker (2 wheeled) Transfers: Sit to/from Stand Sit to Stand: Min assist;From elevated surface         General transfer comment: Assist to boost from elevated bed height with cues for momentum and hand placement. Difficulty standing from low surfaces. Transferred to chair post ambulation.  Ambulation/Gait Ambulation/Gait assistance: Min guard Ambulation Distance (Feet): 200  Feet Assistive device: Rolling walker (2 wheeled) Gait Pattern/deviations: Step-through pattern;Decreased stride length;Trunk flexed Gait velocity: decr   General Gait Details: cues for posture and looking up. 1 standing rest break due to DOE. Sp02 dropped to 80s on RA but resolved quickly with cues for purse dlip breathing.  Stairs            Wheelchair Mobility    Modified Rankin (Stroke Patients Only)       Balance Overall balance assessment: Needs assistance Sitting-balance support: Feet supported;No upper extremity supported Sitting balance-Leahy Scale: Fair     Standing balance support: During functional activity Standing balance-Leahy Scale: Poor Standing balance comment: Reliant on BUEs for support.                              Pertinent Vitals/Pain Pain Assessment: No/denies pain    Home Living Family/patient expects to be discharged to:: Private residence Living Arrangements: Spouse/significant other Available Help at Discharge: Family;Available 24 hours/day Type of Home: House Home Access: Stairs to enter Entrance Stairs-Rails: Right Entrance Stairs-Number of Steps: 2 Home Layout: One level Home Equipment: Walker - 2 wheels;Bedside commode;Shower seat      Prior Function Level of Independence: Independent with assistive device(s)         Comments: Pt d/ced from SNF ~1 week ago and has since started HHPT. Fell with HHPT. Uses RW for ambulation.     Hand Dominance   Dominant Hand: Right    Extremity/Trunk Assessment   Upper Extremity Assessment: Defer to OT evaluation  Lower Extremity Assessment: Generalized weakness (Edema in BLEs.)         Communication   Communication: HOH  Cognition Arousal/Alertness: Awake/alert Behavior During Therapy: WFL for tasks assessed/performed Overall Cognitive Status: Impaired/Different from baseline Area of Impairment: Memory;Safety/judgement     Memory: Decreased  short-term memory   Safety/Judgement: Decreased awareness of deficits;Decreased awareness of safety   Problem Solving: Requires verbal cues      General Comments General comments (skin integrity, edema, etc.): Wife and daughter stepped out of room for session. BP sitting EOB 104/64, BP post transfer to chair 131/86    Exercises     Assessment/Plan    PT Assessment Patient needs continued PT services  PT Problem List Decreased strength;Decreased activity tolerance;Decreased balance;Decreased mobility;Decreased safety awareness;Decreased cognition;Cardiopulmonary status limiting activity          PT Treatment Interventions DME instruction;Gait training;Functional mobility training;Therapeutic activities;Therapeutic exercise;Balance training;Patient/family education;Stair training    PT Goals (Current goals can be found in the Care Plan section)  Acute Rehab PT Goals Patient Stated Goal: Go home PT Goal Formulation: With patient Time For Goal Achievement: 03/10/16 Potential to Achieve Goals: Good    Frequency Min 3X/week   Barriers to discharge Decreased caregiver support      Co-evaluation               End of Session Equipment Utilized During Treatment: Gait belt Activity Tolerance: Treatment limited secondary to medical complications (Comment);Patient tolerated treatment well (Drop in Sp02) Patient left: in chair;with call bell/phone within reach;with family/visitor present Nurse Communication: Mobility status         Time: [id number] PT Time Calculation (min) (ACUTE ONLY): 28 min   Charges:   PT Evaluation $PT Eval Moderate Complexity: 1 Procedure PT Treatments $Gait Training: 8-22 mins   PT G Codes:        Joycelin Radloff A Yoceline Bazar 02/25/2016, 3:13 PM Mylo RedShauna Christipher Rieger, PT, DPT [phone]

## 2016-02-25 NOTE — Telephone Encounter (Signed)
New message       Son request to talk to Dr Tresa Endo.  Patient is in the hosp and he is calling to get an update on his condition

## 2016-02-25 NOTE — Care Management Note (Addendum)
Case Management Note  Patient Details  Name: Danny Oneill MRN: [id number] Date of Birth: 11/12/1931  Subjective/Objective:    Pt presented for Acute on Chronic CHF. Pt recently at [facility] and was d/c home 1 week PTA. Pt was set up with [facility] for [employer] Services RN, PT, OT Services. Pt has DME RW, 3n1, Paediatric nurse.                Action/Plan: CM will contact Angela with [facility] to make her aware that pt is hospitalized. Pt will need PT/OT evaluation before d/c.   Expected Discharge Date:                  Expected Discharge Plan:  Skilled Nursing Facility  In-House Referral:  Clinical Social Work  Discharge planning Services  CM Consult  Post Acute Care Choice: Home Health, Resumption of Services Choice offered to:   DME Arranged:  N/A DME Agency: N/A  HH Arranged: RN,PT,OT, SW, Aide HH Agency: [facility]  Status of Service:  Completed If discussed at Long Length of Stay Meetings, dates discussed:  03-03-16, 03-05-16  Additional Comments: 1548 03-09-16 Tomi Bamberger, RN,BSN [phone]  CM was asked to speak with pt / family in regards to disposition home. Pt was previously active with [facility]. Insurance has not approved at this time for SNF.  Family not really wanting to pay amount for Private Pay Bed. Family in agreement to return home. Personal Care List provided and family will contact agencies to schedule additional care. CM did add CSW and AIde to skilled services. Will need orders and F2F. Staff RN aware of plan of care. CM did offer ambulance transport home and family refused stating they will provide transportation. No additional needs at this time.     1435 03-09-16 Tomi Bamberger, RN,BSN [phone] Pt has a Private Pay Bed available @ [facility] per CSW. CSW is assisting with disposition needs. No further needs from CM at this time.     03-05-16 Pt still Volume Overloaded. Continues on IV Lasix. Insurance Co  initially denied - awaiting feedback from CSW in regards to disposition. CM will continue to monitor.   Gala Lewandowsky, RN 02/25/2016, 12:34 PM

## 2016-02-25 NOTE — Telephone Encounter (Signed)
Discussed with Burna Mortimer and they will need to call hospital for update Advised daughter-in-law to contact hospital

## 2016-02-26 ENCOUNTER — Other Ambulatory Visit: Payer: Self-pay

## 2016-02-26 DIAGNOSIS — I4891 Unspecified atrial fibrillation: Secondary | ICD-10-CM

## 2016-02-26 LAB — BASIC METABOLIC PANEL
Anion gap: 9 (ref 5–15)
BUN: 27 mg/dL — ABNORMAL HIGH (ref 6–20)
CHLORIDE: 97 mmol/L — AB (ref 101–111)
CO2: 26 mmol/L (ref 22–32)
Calcium: 8 mg/dL — ABNORMAL LOW (ref 8.9–10.3)
Creatinine, Ser: 1.09 mg/dL (ref 0.61–1.24)
GFR calc non Af Amer: >60 mL/min (ref >60)
Glucose, Bld: 113 mg/dL — ABNORMAL HIGH (ref 65–99)
POTASSIUM: 4 mmol/L (ref 3.5–5.1)
SODIUM: 132 mmol/L — AB (ref 135–145)

## 2016-02-26 LAB — HEPARIN LEVEL (UNFRACTIONATED): HEPARIN UNFRACTIONATED: 0.35 [IU]/mL (ref 0.30–0.70)

## 2016-02-26 MED ORDER — AMIODARONE HCL IN DEXTROSE 360-4.14 MG/200ML-% IV SOLN
60.0000 mg/h | INTRAVENOUS | Status: DC
  Administered 2016-02-26: 60 mg/h via INTRAVENOUS
  Filled 2016-02-26 (×2): qty 200

## 2016-02-26 MED ORDER — HEPARIN (PORCINE) IN NACL 100-0.45 UNIT/ML-% IJ SOLN
1600.0000 [IU]/h | INTRAMUSCULAR | Status: DC
  Administered 2016-02-26 – 2016-02-27 (×2): 1600 [IU]/h via INTRAVENOUS
  Filled 2016-02-26 (×2): qty 250

## 2016-02-26 MED ORDER — AMIODARONE LOAD VIA INFUSION
150.0000 mg | Freq: Once | INTRAVENOUS | Status: AC
  Administered 2016-02-26: 150 mg via INTRAVENOUS
  Filled 2016-02-26: qty 83.34

## 2016-02-26 MED ORDER — AMIODARONE HCL IN DEXTROSE 360-4.14 MG/200ML-% IV SOLN
30.0000 mg/h | INTRAVENOUS | Status: DC
  Administered 2016-02-26: 30 mg/h via INTRAVENOUS
  Filled 2016-02-26: qty 200

## 2016-02-26 NOTE — Progress Notes (Signed)
OT Cancellation Note  Patient Details Name: Danny Oneill MRN: [id number] DOB: Dec 23, 1931   Cancelled Treatment:    Reason Eval/Treat Not Completed: Medical issues which prohibited therapy (afib this AM and started on medication; RN requesting therapy hold off at this time). Will follow up for OT eval as time allows.  Gaye Alken M.S., OTR/L Pager: [phone]  02/26/2016, 9:18 AM

## 2016-02-26 NOTE — Progress Notes (Signed)
ANTICOAGULATION CONSULT NOTE  Pharmacy Consult for heparin Indication: atrial fibrillation  Heparin Dosing Weight: 105.8 kg   Assessment: 84 yom with new afib now converted to NSR. Pharmacy consulted to start heparin. Not on anticoagulation PTA.  -initial heparin level is 0.35 and at goal  Goal of Therapy:  Heparin level 0.3-0.7 units/ml Monitor platelets by anticoagulation protocol: Yes   Plan:  No heparin changes needed Daily heparin level/CBC  Danny Oneill, Pharm D 02/26/2016 8:22 PM

## 2016-02-26 NOTE — Progress Notes (Signed)
Pt converted to afib at 6 am. Performed EKG to confirm and text paged on call Cardiology through [facility]. Waiting return call.

## 2016-02-26 NOTE — Progress Notes (Signed)
Heart Failure Navigator Consult Note  Presentation: per H & P Danny Oneill is a 82 y.o.malewith PMH of Right he arthroplasty and asbestosis exposure was recently admitted [facility] on 02/02/2016 with inferolateral STEMI. He underwent urgent cardiac catheterization on 10/29/2017which showed 50% ostial RCA lesion, 40% ostial left main lesion, 70% ostial D1 lesion, 100% ostial OM 2 occlusion treated with 2.25 x 16 mm DES. He was hypotensive in the Cath Lab, balloon pump was placed, however stressor was initiated unless his BP drop significantly. Echocardiogram obtained on 02/03/2016 which showed EF 35-40%, akinesis of the inferior myocardium, moderate AR. His balloon pump was discontinued on 10/31. Post procedure, his EKG showed persistent ST elevation. According to family history, he actually had symptom several days prior to arrival. Post procedure, he was placed on carvedilol, lisinopril, Lipitor, aspirin and Brilinta. He was evaluated by physical therapy, who recommended skilled nursing facility on discharge. He also has some elevated LFT as well, Lipitor was held pending return of liver function test back to normal.. Hemoglobin was 5.9 which was prediabetes range. During this hospitalization, he did have urinary retention requiring Foley placement. Urology was consulted, and he was discharged with a Foley and with plan to follow-up with urology in a week after discharge. He also had some hematuria likely due to trauma of Foley insertion. Since discharge, he did go back to the emergency room again for urinary retention. Based on nursing home note, his lisinopril was held due to hypotension.   Patient presents today for cardiology office evaluation. He has gained roughly 30 pounds since his last visit. He has 3-4+ pitting edema on physical exam. His scrotum is severely edematous. He does have a Foley attached to his leg that was put in by urology. He does have a UTI and was recently started on  amoxicillin. EKG today showed diffuse low voltage throughout. Fortunately, at rest, his O2 saturation is 98%. He admitted to have abdominal distention, orthopnea and paroxysmal nocturnal dyspnea in the last few days. His albumin was very low on recent lab work, family has been giving him Ensure. I will start him on Lasix 40 mg daily5 days, then down to 20 mg daily. Hold lisinopril for now.    Past Medical History:  Diagnosis Date  . Arthritis   . Asbestosis (HCC)    "no breathing problems" pt states worked in [employer] for many yrs and was exposed to asbestosis  . Hyperlipidemia   . Lumbar spondylosis   . Myocardial infarction     Social History   Social History  . Marital status: Married    Spouse name: N/A  . Number of children: N/A  . Years of education: N/A   Social History Main Topics  . Smoking status: Never Smoker  . Smokeless tobacco: Never Used  . Alcohol use No  . Drug use: No  . Sexual activity: Not Asked   Other Topics Concern  . None   Social History Narrative  . None    ECHO:Study Conclusions--02/24/16  - Left ventricle: The cavity size was normal. Systolic function was   mildly to moderately reduced. The estimated ejection fraction was   in the range of 40% to 45%. Hypokinesis of the inferior and   inferoseptal myocardium. Doppler parameters are consistent with   abnormal left ventricular relaxation (grade 1 diastolic   dysfunction). - Aortic valve: There was mild regurgitation. - Pulmonary arteries: PA peak pressure: 31 mm Hg (S). - Pericardium, extracardiac: A moderate to large,  free-flowing   pericardial effusion was identified circumferential to the heart.   The fluid had no internal echoes. There was mildright ventricular   chamber collapse for less than 50% of the cardiac cycle.   Respirophasic change in stroke volume was excessive. Features   were consistent with mild tamponade physiology.  BNP    Component Value Date/Time   BNP 745.7  (H) 02/24/2016 1610   BNP 552.7 (H) 02/24/2016 1409    ProBNP No results found for: PROBNP   Education Assessment and Provision:  Detailed education and instructions provided on heart failure disease management including the following:  Signs and symptoms of Heart Failure When to call the physician Importance of daily weights Low sodium diet Fluid restriction Medication management Anticipated future follow-up appointments  Patient education given on each of the above topics.  Patient acknowledges understanding and acceptance of all instructions.  I spoke with Danny Oneill and his son regarding current hospitalization and "new" diagnosis of HF.  He tells me that no one educated them prior to leaving last admission regarding HF and recommendations for home.  He says they have been "doing it all wrong"--actually "forcing fluids" per recommendation of "urologist".   I reviewed the importance of daily weights and how they relate to the signs and symptoms of HF.  I also reinforced a low sodium diet and high sodium foods to avoid.  He lives in [place] with his wife and sons have been staying with them since he has been deconditioned recently. Son denies any issues with getting or taking prescribed medications.  He follows with [facility].  Education Materials:  "Living Better With Heart Failure" Booklet, Daily Weight Tracker Tool    High Risk Criteria for Readmission and/or Poor Patient Outcomes:   EF <30%- 40-45%  2 or more admissions in 6 months- 2/ 49mo  Difficult social situation- No  Demonstrates medication noncompliance-denies   Barriers of Care:  Knowledge and compliance  Discharge Planning:   He plans to return to home with wife (Sons assisting)-  If he physically able.  They would benefit continuing HHRN and HHPT for ongoing education, compliance reinforcement and symptom recognition.

## 2016-02-26 NOTE — Progress Notes (Signed)
Converted to NSR on amiodarone. Continue IV amiodarone, likely convert to oral tomorrow. It appears urology has been consulted for management of indwelling foley. Scrotal edema still present and tender. SBP 98, instructed nursing staff to give low dose coreg. Once BP come up, may need to consider Toprol XL starting at 12.5 mg daily going up to help with better rate control and LV dysfunction.  Ramond Dial PA Pager: [phone]

## 2016-02-26 NOTE — Progress Notes (Signed)
Patient Name: Danny Oneill Date of Encounter: 02/26/2016  Primary Cardiologist: Dr. Rachelle Hora Problem List     Active Problems:   Acute on chronic systolic heart failure (HCC)     Subjective   Says he feels ok, is SOB. Denies palpitations and chest pain.   Inpatient Medications    Scheduled Meds: . amiodarone  150 mg Intravenous Once  . aspirin EC  81 mg Oral Q breakfast  . atorvastatin  20 mg Oral q1800  . carvedilol  3.125 mg Oral BID WC  . furosemide  40 mg Intravenous Daily  . heparin subcutaneous  5,000 Units Subcutaneous Q8H  . Melatonin  1 tablet Oral QHS  . pantoprazole  40 mg Oral Daily  . sodium chloride flush  3 mL Intravenous Q12H  . tamsulosin  0.4 mg Oral QPC supper  . ticagrelor  90 mg Oral BID   Continuous Infusions: . amiodarone     Followed by  . amiodarone     PRN Meds: sodium chloride, acetaminophen, ondansetron (ZOFRAN) IV, sodium chloride flush   Vital Signs    Vitals:   02/25/16 2028 02/26/16 0028 02/26/16 0525 02/26/16 0740  BP: 101/60 96/60 101/65 92/63  Pulse: 70 70 65 (!) 122  Resp: (!) Temp: 98.7 F (37.1 C) 98.8 F (37.1 C) 98.2 F (36.8 C) 98 F (36.7 C)  TempSrc: Oral Oral Oral Oral  SpO2: 95% 94% 98% 98%  Weight:   241 lb 9.6 oz (109.6 kg)   Height:        Intake/Output Summary (Last 24 hours) at 02/26/16 0753 Last data filed at 02/26/16 0526  Gross per 24 hour  Intake             1433 ml  Output             1200 ml  Net              233 ml   Filed Weights   02/24/16 2100 02/25/16 0607 02/26/16 0525  Weight: 249 lb 4.8 oz (113.1 kg) 245 lb 14.4 oz (111.5 kg) 241 lb 9.6 oz (109.6 kg)    Physical Exam   GEN: Well nourished, well developed, in no acute distress.  HEENT: Grossly normal.  Neck: Supple, + JVD, carotid bruits, or masses. Cardiac: irregular rhythm, no murmurs, rubs, or gallops. No clubbing, cyanosis, edema.  Radials/DP/PT 2+ and equal bilaterally.  Respiratory:  Respirations  regular but labored, diminished throughout GI: Soft, nontender, nondistended, BS + x 4. MS: no deformity or atrophy. Skin: warm and dry, no rash. Neuro:  Strength and sensation are intact. Psych: AAOx3.  Normal affect.  Labs    CBC  Recent Labs  02/24/16 1610  WBC 8.5  HGB 11.5*  HCT 35.6*  MCV 93.4  PLT 273   Basic Metabolic Panel  Recent Labs  02/24/16 1610 02/25/16 0443  NA 131* 132*  K 5.5* 4.3  CL 100* 99*  CO2 22 25  GLUCOSE 130* 109*  BUN 30* 29*  CREATININE 1.32* 1.18  CALCIUM 8.3* 8.0*   Liver Function Tests  Recent Labs  02/24/16 1409  AST 44*  ALT 56*  ALKPHOS 50  BILITOT 1.0  PROT 5.7*  ALBUMIN 3.0*     Telemetry    Afib RVR - Personally Reviewed  ECG    Afib RVR with rate of 129- Personally Reviewed  Radiology    Dg Chest 2 View  Result Date: 02/24/2016  CLINICAL DATA:  Shortness of breath, weakness. History of asbestosis. EXAM: CHEST  2 VIEW COMPARISON:  Chest radiograph February 05, 2016 FINDINGS: Cardiac silhouette is moderately enlarged unchanged. Mediastinal silhouette is nonsuspicious, calcified aortic knob. Pulmonary vascular congestion. Strandy densities LEFT lung base, small LEFT pleural effusion. Bilateral calcified pleural plaques. Improved aeration of LEFT lung base. No pneumothorax. Soft tissue planes and included osseous structures are nonsuspicious. IMPRESSION: Stable cardiomegaly and pulmonary vascular congestion. Small LEFT pleural effusion. Calcified pleural plaques consistent with asbestosis. Electronically Signed   By: Awilda Metroourtnay  Bloomer M.D.   On: 02/24/2016 18:32    Cardiac Studies   Transthoracic Echocardiography Study Conclusions  - Left ventricle: The cavity size was normal. Systolic function was   mildly to moderately reduced. The estimated ejection fraction was   in the range of 40% to 45%. Hypokinesis of the inferior and   inferoseptal myocardium. Doppler parameters are consistent with   abnormal left  ventricular relaxation (grade 1 diastolic   dysfunction). - Aortic valve: There was mild regurgitation. - Pulmonary arteries: PA peak pressure: 31 mm Hg (S). - Pericardium, extracardiac: A moderate to large, free-flowing   pericardial effusion was identified circumferential to the heart.   The fluid had no internal echoes. There was mildright ventricular   chamber collapse for less than 50% of the cardiac cycle.   Respirophasic change in stroke volume was excessive. Features   were consistent with mild tamponade physiology.   Patient Profile     Danny Oneill is a 82 year old male with a past medical history of inferolateral STEMI on 02/02/16 with DES placed to his ostial OM2. He had IABP placed for a few days and his EF was reduced at 35-40%. He was discharged to rehab facility and has had issues with BOO since, he has an indwelling foley.   He presented to the office on 02/24/16 for follow up, he had gained about 30 pounds, had pitting edema and was SOB. He was admitted from our office as he was found to have a large pericardial effusion. Developed rapid Afib this am, and was hypotensive.   Assessment & Plan    1. Atrial fibrillation with RVR: Patient developed rapid Afib this am, BP was soft in the 80-90's range. Started on Amiodarone gtt. Will also start on heparin gtt for anticoagulation.   2. Pericardial effusion: Diuresed 3.3L, weight down to 241lbs from 249lbs. Continue IV diuresis. Renal function stable. Will need to repeat Echo tomorrow.   3. CAD s/p STEMI with DES to OM in Oct. 2017  4. LV dysfunction: EF 2 days ago was 40-45%, stable from prior Echo. Continue IV diuresis, BP too soft for ACE-I or ARB.   Signed, Little IshikawaErin E Smith, NP  02/26/2016, 7:53 AM    I saw and evaluated the Patient This Morning along with Suzzette RighterErin Smith. We Were Called Because He Went into A. fib RVR This Morning with Borderline Blood Pressures. His blood pressures are not that much lower than they have been,  and I suspect that it would be not asked unexpected for his blood pressure to drop with significant A. fib and RVR with his cardiomyopathy. I don't think that this is related to his poor cardiac effusion simply because he was eating breakfast during my evaluation. He has an infectious process ongoing that potentially driving this. I do think however that he would be better served with improved rate control and possible cardioversion with amiodarone. We will start heparin for now as he is  of high risk with his low EF, recent MI and age.  This patients CHA2DS2-VASc Score and unadjusted Ischemic Stroke Rate (% per year) is equal to 7.2 % stroke rate/year from a score of 5 Above score calculated as: 1 point each if present [CHF, HTN, DM, Vascular=MI/PAD/Aortic Plaque, Age if 40-74, or Male]; 2 points each if present [Age > 75, or Stroke/TIA/TE]   I discussed patient with Dr. Allyson Sabal who go by to see the patient shortly after he started on amiodarone. If he does have low blood pressures, we may need to hold his diuresis for now, but would probably try to continue once his blood pressure stabilizes. I agree that would prefer to to hold his afterload reducing agents in order to allow continue diuresis since this seems to have made a dramatic improvement in his respiratory status.   Bryan Lemma, M.D., M.S. Interventional Cardiologist   Pager # [phone] Phone # [phone] [address] [place], [place] [address]

## 2016-02-26 NOTE — Progress Notes (Signed)
ANTICOAGULATION CONSULT NOTE  Pharmacy Consult for heparin Indication: atrial fibrillation  Heparin Dosing Weight: 105.8 kg   Assessment: 84 yom with new afib. Pharmacy consulted to start heparin. Not on anticoagulation PTA. Previously on heparin SQ, last dose 0708 this AM. Hg 11.5, plt wnl. No bleeding documented.  Goal of Therapy:  Heparin level 0.3-0.7 units/ml Monitor platelets by anticoagulation protocol: Yes   Plan:  D/c heparin SQ Start heparin at 1600 units/h (no bolus) 8h heparin level Daily heparin level/CBC Monitor s/sx bleeding   Babs Bertin, PharmD, BCPS Clinical Pharmacist 02/26/2016 10:38 AM

## 2016-02-27 ENCOUNTER — Inpatient Hospital Stay (HOSPITAL_COMMUNITY): Payer: Medicare HMO

## 2016-02-27 ENCOUNTER — Encounter (HOSPITAL_COMMUNITY): Payer: Self-pay | Admitting: General Practice

## 2016-02-27 DIAGNOSIS — I313 Pericardial effusion (noninflammatory): Secondary | ICD-10-CM

## 2016-02-27 DIAGNOSIS — I255 Ischemic cardiomyopathy: Secondary | ICD-10-CM

## 2016-02-27 DIAGNOSIS — I480 Paroxysmal atrial fibrillation: Secondary | ICD-10-CM

## 2016-02-27 DIAGNOSIS — I2510 Atherosclerotic heart disease of native coronary artery without angina pectoris: Secondary | ICD-10-CM

## 2016-02-27 DIAGNOSIS — I319 Disease of pericardium, unspecified: Secondary | ICD-10-CM

## 2016-02-27 DIAGNOSIS — I3139 Other pericardial effusion (noninflammatory): Secondary | ICD-10-CM

## 2016-02-27 LAB — HEPARIN LEVEL (UNFRACTIONATED): HEPARIN UNFRACTIONATED: 0.36 [IU]/mL (ref 0.30–0.70)

## 2016-02-27 LAB — CBC
HEMATOCRIT: 30.9 % — AB (ref 39.0–52.0)
HEMOGLOBIN: 10.1 g/dL — AB (ref 13.0–17.0)
MCH: 30.1 pg (ref 26.0–34.0)
MCHC: 32.7 g/dL (ref 30.0–36.0)
MCV: 92.2 fL (ref 78.0–100.0)
Platelets: 225 10*3/uL (ref 150–400)
RBC: 3.35 MIL/uL — ABNORMAL LOW (ref 4.22–5.81)
RDW: 14.4 % (ref 11.5–15.5)
WBC: 6.7 10*3/uL (ref 4.0–10.5)

## 2016-02-27 LAB — ECHOCARDIOGRAM LIMITED
CHL CUP RV SYS PRESS: 32 mmHg
CHL CUP TV REG PEAK VELOCITY: 204 cm/s
EERAT: 13.56
EWDT: 268 ms
FS: 27 % — AB (ref 28–44)
Height: 74 in
IVS/LV PW RATIO, ED: 0.99
LA ID, A-P, ES: 26 mm
LA diam end sys: 26 mm
LA diam index: 1.09 cm/m2
LDCA: 4.52 cm2
LV E/e'average: 13.56
LV PW d: 12 mm — AB (ref 0.6–1.1)
LV TDI E'LATERAL: 5.33
LV TDI E'MEDIAL: 5.33
LV e' LATERAL: 5.33 cm/s
LVEEMED: 13.56
LVOTD: 24 mm
MV Dec: 268
MV pk A vel: 55.3 m/s
MVPG: 2 mmHg
MVPKEVEL: 72.3 m/s
P 1/2 time: 541 ms
TR max vel: 204 cm/s
VTI: 142 cm
WEIGHTICAEL: 3992 [oz_av]

## 2016-02-27 LAB — BASIC METABOLIC PANEL
Anion gap: 9 (ref 5–15)
BUN: 27 mg/dL — AB (ref 6–20)
CHLORIDE: 96 mmol/L — AB (ref 101–111)
CO2: 25 mmol/L (ref 22–32)
CREATININE: 1.23 mg/dL (ref 0.61–1.24)
Calcium: 8 mg/dL — ABNORMAL LOW (ref 8.9–10.3)
GFR calc Af Amer: >60 mL/min (ref >60)
GFR calc non Af Amer: 53 mL/min — ABNORMAL LOW (ref >60)
GLUCOSE: 164 mg/dL — AB (ref 65–99)
Potassium: 4 mmol/L (ref 3.5–5.1)
SODIUM: 130 mmol/L — AB (ref 135–145)

## 2016-02-27 MED ORDER — AMIODARONE HCL 200 MG PO TABS
400.0000 mg | ORAL_TABLET | Freq: Two times a day (BID) | ORAL | Status: DC
  Administered 2016-02-27 – 2016-03-04 (×13): 400 mg via ORAL
  Filled 2016-02-27 (×15): qty 2

## 2016-02-27 NOTE — Progress Notes (Signed)
Patient Name: Danny Oneill Date of Encounter: 02/27/2016  Primary Cardiologist: Dr. Ozella Rocks Problem List     Principal Problem:   Acute on chronic systolic heart failure [facility]) Active Problems:   Pericardial effusion   PAF (paroxysmal atrial fibrillation) (HCC)   CAD (coronary artery disease)   Cardiomyopathy, ischemic    Subjective   Feels weak, still short of breath. No chest pain or palpitations. Appetite fair.  Inpatient Medications    Scheduled Meds: . aspirin EC  81 mg Oral Q breakfast  . atorvastatin  20 mg Oral q1800  . carvedilol  3.125 mg Oral BID WC  . furosemide  40 mg Intravenous Daily  . Melatonin  1 tablet Oral QHS  . pantoprazole  40 mg Oral Daily  . sodium chloride flush  3 mL Intravenous Q12H  . tamsulosin  0.4 mg Oral QPC supper  . ticagrelor  90 mg Oral BID   Continuous Infusions: . amiodarone 30 mg/hr (02/26/16 2255)  . heparin 1,600 Units/hr (02/27/16 0554)   PRN Meds: sodium chloride, acetaminophen, ondansetron (ZOFRAN) IV, sodium chloride flush   Vital Signs    Vitals:   02/26/16 2010 02/26/16 2357 02/27/16 0611 02/27/16 0735  BP: 105/68 114/64 105/65 111/67  Pulse: 64 61 68 (!) 56  Resp: 18 18 18 16   Temp: 99.1 F (37.3 C) 98.6 F (37 C) 98.7 F (37.1 C) 97.3 F (36.3 C)  TempSrc: Oral Oral Oral Oral  SpO2: 99% 97% 98% 99%  Weight:   249 lb 8 oz (113.2 kg)   Height:        Intake/Output Summary (Last 24 hours) at 02/27/16 0858 Last data filed at 02/27/16 0612  Gross per 24 hour  Intake          1416.49 ml  Output             1075 ml  Net           341.49 ml   Filed Weights   02/25/16 0607 02/26/16 0525 02/27/16 0611  Weight: 245 lb 14.4 oz (111.5 kg) 241 lb 9.6 oz (109.6 kg) 249 lb 8 oz (113.2 kg)    Physical Exam   Gen.: Obese male in no distress. HEENT: Conjunctiva and lids normal, oropharynx clear. Neck: Supple, elevated JVP, carotid bruits, no thyromegaly. Lungs: Clear to auscultation, nonlabored  breathing at rest. Cardiac: Regular rate and rhythm, no S3, no pericardial rub. Abdomen: Distended, nontender, bowel sounds present. Extremities: Pitting edema bilaterally, distal pulses 2+.  Labs    CBC  Recent Labs  02/24/16 1610 02/27/16 0323  WBC 8.5 6.7  HGB 11.5* 10.1*  HCT 35.6* 30.9*  MCV 93.4 92.2  PLT 273 225   Basic Metabolic Panel  Recent Labs  02/26/16 0649 02/27/16 0323  NA 132* 130*  K 4.0 4.0  CL 97* 96*  CO2 26 25  GLUCOSE 113* 164*  BUN 27* 27*  CREATININE 1.09 1.23  CALCIUM 8.0* 8.0*   Liver Function Tests  Recent Labs  02/24/16 1409  AST 44*  ALT 56*  ALKPHOS 50  BILITOT 1.0  PROT 5.7*  ALBUMIN 3.0*    Telemetry    I personally reviewed telemetry monitoring which shows normal sinus rhythm.  ECG    I personally reviewed the tracing from 02/26/2016 which showed rapid atrial fibrillation with low voltage and nonspecific ST-T changes.  Cardiac Studies   Echocardiogram 02/24/2016: Study Conclusions  - Left ventricle: The cavity size was normal. Systolic  function was   mildly to moderately reduced. The estimated ejection fraction was   in the range of 40% to 45%. Hypokinesis of the inferior and   inferoseptal myocardium. Doppler parameters are consistent with   abnormal left ventricular relaxation (grade 1 diastolic   dysfunction). - Aortic valve: There was mild regurgitation. - Pulmonary arteries: PA peak pressure: 31 mm Hg (S). - Pericardium, extracardiac: A moderate to large, free-flowing   pericardial effusion was identified circumferential to the heart.   The fluid had no internal echoes. There was mildright ventricular   chamber collapse for less than 50% of the cardiac cycle.   Respirophasic change in stroke volume was excessive. Features   were consistent with mild tamponade physiology.  Patient Profile     82 year old male with history of inferolateral STEMI in late October managed with DES to the OM 2 and  complicated by shock requiring AIBP with associated cardiomyopathy. He has had recent trouble with bladder obstruction requiring Foley catheter, also substantial weight gain and ultimately diagnosis of a large pericardial effusion. He has had atrial fibrillation during hospital stay, now in sinus rhythm on amiodarone.  Assessment & Plan    1. Moderate to large pericardial effusion by echocardiogram on November 20. Follow-up limited study will be obtained for reassessment. He has been on IV Lasix diuresis.  2. Paroxysmal atrial fibrillation noted during this hospital stay, possibly related to problem #1. He is now holding sinus rhythm on IV amiodarone. He was also started on IV heparin. CHADSVASC score is 5.  3. CAD status post recent inferolateral STEMI in late October treated with DES to the OM 2. No active angina symptoms. He is on aspirin and Brilinta.  4. Bladder outlet obstruction with Foley catheter in place.  5. Acute on chronic systolic heart failure with volume overload.  Discussed with patient, wife, and son. Plan is to transition to oral amiodarone at 400 mg twice daily today, I would also stop heparin so as not to contribute to hemorrhagic conversion of his pericardial effusion. Follow-up limited echocardiogram to reassess pericardial effusion size. Pericardiocentesis has been considered if he does not show improvement.  Signed, Jonelle SidleSamuel G. Zylah Elsbernd, M.D., F.A.C.C.  02/27/2016, 8:58 AM

## 2016-02-27 NOTE — Progress Notes (Signed)
ANTICOAGULATION CONSULT NOTE - Follow Up Consult  Pharmacy Consult for heparin Indication: atrial fibrillation  Allergies  Allergen Reactions  . Morphine And Related Other (See Comments)    Confusion/ hallucinations     Patient Measurements: Height: 6\' 2"  (188 cm) Weight: 249 lb 8 oz (113.2 kg) IBW/kg (Calculated) : 82.2 Heparin Dosing Weight: 105.8 kg  Vital Signs: Temp: 97.3 F (36.3 C) (11/23 0735) Temp Source: Oral (11/23 0735) BP: 111/67 (11/23 0735) Pulse Rate: 56 (11/23 0735)  Labs:  Recent Labs  02/24/16 1610 02/25/16 0443 02/26/16 0649 02/26/16 1827 02/27/16 0323  HGB 11.5*  --   --   --  10.1*  HCT 35.6*  --   --   --  30.9*  PLT 273  --   --   --  225  HEPARINUNFRC  --   --   --  0.35 0.36  CREATININE 1.32* 1.18 1.09  --  1.23    Estimated Creatinine Clearance: 62 mL/min (by C-G formula based on SCr of 1.23 mg/dL).    Assessment: 82 year old male with new onset afib that has been converted to NSR. No anticoagulation PTA. Patient is now therapeutic x 2 at 0.36 on 1600 units/hr. H/H are slightly down but platelets are stable. No signs of bleeding documented.   Goal of Therapy:  Heparin level 0.3-0.7 units/ml Monitor platelets by anticoagulation protocol: Yes   Plan:  Continue heparin at 1600 units/hr. Daily heparin level and CBC Monitor for signs and symptoms of bleeding.   Hillis Range. PharmD PGY1 Pharmacy Resident Pager: [phone] 02/27/2016,8:44 AM

## 2016-02-27 NOTE — Progress Notes (Signed)
  Echocardiogram 2D Echocardiogram has been performed.  Cathie Beams 02/27/2016, 12:54 PM

## 2016-02-28 DIAGNOSIS — I480 Paroxysmal atrial fibrillation: Secondary | ICD-10-CM

## 2016-02-28 DIAGNOSIS — I255 Ischemic cardiomyopathy: Secondary | ICD-10-CM

## 2016-02-28 LAB — BASIC METABOLIC PANEL
Anion gap: 9 (ref 5–15)
BUN: 21 mg/dL — AB (ref 6–20)
CALCIUM: 8 mg/dL — AB (ref 8.9–10.3)
CO2: 27 mmol/L (ref 22–32)
CREATININE: 1.01 mg/dL (ref 0.61–1.24)
Chloride: 96 mmol/L — ABNORMAL LOW (ref 101–111)
GFR calc Af Amer: >60 mL/min (ref >60)
GLUCOSE: 115 mg/dL — AB (ref 65–99)
POTASSIUM: 3.8 mmol/L (ref 3.5–5.1)
SODIUM: 132 mmol/L — AB (ref 135–145)

## 2016-02-28 LAB — CBC
HCT: 33.3 % — ABNORMAL LOW (ref 39.0–52.0)
Hemoglobin: 10.6 g/dL — ABNORMAL LOW (ref 13.0–17.0)
MCH: 29.6 pg (ref 26.0–34.0)
MCHC: 31.8 g/dL (ref 30.0–36.0)
MCV: 93 fL (ref 78.0–100.0)
Platelets: 227 K/uL (ref 150–400)
RBC: 3.58 MIL/uL — ABNORMAL LOW (ref 4.22–5.81)
RDW: 14.5 % (ref 11.5–15.5)
WBC: 6.5 K/uL (ref 4.0–10.5)

## 2016-02-28 LAB — HEPARIN LEVEL (UNFRACTIONATED): Heparin Unfractionated: <0.1 [IU]/mL — ABNORMAL LOW (ref 0.30–0.70)

## 2016-02-28 MED ORDER — FUROSEMIDE 40 MG PO TABS
40.0000 mg | ORAL_TABLET | Freq: Every day | ORAL | Status: DC
  Administered 2016-02-28 – 2016-02-29 (×2): 40 mg via ORAL
  Filled 2016-02-28 (×2): qty 1

## 2016-02-28 NOTE — Progress Notes (Signed)
Subjective:  Feeling much better today. Converted to NSR. Great diuresis (6L). No CP/SOB. Major issue is scrotal edema. Have asked Urology to consult  Objective:  Temp:  [97.4 F (36.3 C)-98.6 F (37 C)] 97.5 F (36.4 C) (11/24 0758) Pulse Rate:  [58-65] 64 (11/24 0758) Resp:  [16-18] 18 (11/24 0758) BP: (97-138)/(61-77) 109/66 (11/24 0758) SpO2:  [96 %-100 %] 98 % (11/24 0758) Weight:  [248 lb 14.4 oz (112.9 kg)] 248 lb 14.4 oz (112.9 kg) (11/24 0500) Weight change: -9.6 oz (-0.272 kg)  Intake/Output from previous day: 11/23 0701 - 11/24 0700 In: 120 [P.O.:120] Out: 3000 [Urine:3000]  Intake/Output from this shift: No intake/output data recorded.  Physical Exam: General appearance: alert and no distress Neck: no adenopathy, no carotid bruit, no JVD, supple, symmetrical, trachea midline and thyroid not enlarged, symmetric, no tenderness/mass/nodules Lungs: clear to auscultation bilaterally Heart: regular rate and rhythm, S1, S2 normal, no murmur, click, rub or gallop Extremities: extremities normal, atraumatic, no cyanosis or edema  Lab Results: Results for orders placed or performed during the hospital encounter of 02/24/16 (from the past 48 hour(s))  Heparin level (unfractionated)     Status: None   Collection Time: 02/26/16  6:27 PM  Result Value Ref Range   Heparin Unfractionated 0.35 0.30 - 0.70 IU/mL    Comment:        IF HEPARIN RESULTS ARE BELOW EXPECTED VALUES, AND PATIENT DOSAGE HAS BEEN CONFIRMED, SUGGEST FOLLOW UP TESTING OF ANTITHROMBIN III LEVELS.   Basic metabolic panel     Status: Abnormal   Collection Time: 02/27/16  3:23 AM  Result Value Ref Range   Sodium 130 (L) 135 - 145 mmol/L   Potassium 4.0 3.5 - 5.1 mmol/L   Chloride 96 (L) 101 - 111 mmol/L   CO2 25 22 - 32 mmol/L   Glucose, Bld 164 (H) 65 - 99 mg/dL   BUN 27 (H) 6 - 20 mg/dL   Creatinine, Ser 1.23 0.61 - 1.24 mg/dL   Calcium 8.0 (L) 8.9 - 10.3 mg/dL   GFR calc non Af Amer 53 (L)  >60 mL/min   GFR calc Af Amer >60 >60 mL/min    Comment: (NOTE) The eGFR has been calculated using the CKD EPI equation. This calculation has not been validated in all clinical situations. eGFR's persistently <60 mL/min signify possible Chronic Kidney Disease.    Anion gap 9 5 - 15  Heparin level (unfractionated)     Status: None   Collection Time: 02/27/16  3:23 AM  Result Value Ref Range   Heparin Unfractionated 0.36 0.30 - 0.70 IU/mL    Comment:        IF HEPARIN RESULTS ARE BELOW EXPECTED VALUES, AND PATIENT DOSAGE HAS BEEN CONFIRMED, SUGGEST FOLLOW UP TESTING OF ANTITHROMBIN III LEVELS.   CBC     Status: Abnormal   Collection Time: 02/27/16  3:23 AM  Result Value Ref Range   WBC 6.7 4.0 - 10.5 K/uL   RBC 3.35 (L) 4.22 - 5.81 MIL/uL   Hemoglobin 10.1 (L) 13.0 - 17.0 g/dL   HCT 30.9 (L) 39.0 - 52.0 %   MCV 92.2 78.0 - 100.0 fL   MCH 30.1 26.0 - 34.0 pg   MCHC 32.7 30.0 - 36.0 g/dL   RDW 14.4 11.5 - 15.5 %   Platelets 225 150 - 400 K/uL  Basic metabolic panel     Status: Abnormal   Collection Time: 02/28/16  5:39 AM  Result Value Ref  Range   Sodium 132 (L) 135 - 145 mmol/L   Potassium 3.8 3.5 - 5.1 mmol/L   Chloride 96 (L) 101 - 111 mmol/L   CO2 27 22 - 32 mmol/L   Glucose, Bld 115 (H) 65 - 99 mg/dL   BUN 21 (H) 6 - 20 mg/dL   Creatinine, Ser 1.01 0.61 - 1.24 mg/dL   Calcium 8.0 (L) 8.9 - 10.3 mg/dL   GFR calc non Af Amer >60 >60 mL/min   GFR calc Af Amer >60 >60 mL/min    Comment: (NOTE) The eGFR has been calculated using the CKD EPI equation. This calculation has not been validated in all clinical situations. eGFR's persistently <60 mL/min signify possible Chronic Kidney Disease.    Anion gap 9 5 - 15  Heparin level (unfractionated)     Status: Abnormal   Collection Time: 02/28/16  5:39 AM  Result Value Ref Range   Heparin Unfractionated <0.10 (L) 0.30 - 0.70 IU/mL    Comment:        IF HEPARIN RESULTS ARE BELOW EXPECTED VALUES, AND PATIENT DOSAGE HAS  BEEN CONFIRMED, SUGGEST FOLLOW UP TESTING OF ANTITHROMBIN III LEVELS. REPEATED TO VERIFY   CBC     Status: Abnormal   Collection Time: 02/28/16  5:39 AM  Result Value Ref Range   WBC 6.5 4.0 - 10.5 K/uL   RBC 3.58 (L) 4.22 - 5.81 MIL/uL   Hemoglobin 10.6 (L) 13.0 - 17.0 g/dL   HCT 33.3 (L) 39.0 - 52.0 %   MCV 93.0 78.0 - 100.0 fL   MCH 29.6 26.0 - 34.0 pg   MCHC 31.8 30.0 - 36.0 g/dL   RDW 14.5 11.5 - 15.5 %   Platelets 227 150 - 400 K/uL    Imaging: Imaging results have been reviewed  Tele- NSR (I have personally reviewed)  Assessment/Plan:   1. Principal Problem: 2.   Acute on chronic systolic heart failure ([place]) 3. Active Problems: 4.   Pericardial effusion 5.   PAF (paroxysmal atrial fibrillation) ([place]) 6.   CAD (coronary artery disease) 7.   Cardiomyopathy, ischemic 8.   Time Spent Directly with Patient:  20 minutes  Length of Stay:  LOS: 4 days   Pt admitted 84/06 with Systolic HF/ Vol overload, large pericardial effusion and PAF with RVR. He has diuresed 6L. Clinically improved. 2D shows mild improvement in LVEF (45%) as well as pericardial effusion w/o clear evid of tamponade physiology. Clinically not in tamponade as well. He has converted to NSR on IV---> PO Amio load. While his CHA2DSVASC score is elevated and he meets criteria for [place], I'm hesitant to start [facility] given pericardial effusion and risk of hemorraghic transformation. Will ask Urology to eval scrotal edema. They apparently put his foley catheter in and began ATBX for UTI. Ambulate with PT today. I have asked Urology ( Dr Matilde Sprang) to see. Hopefully home over the weekend.   Quay Burow 02/28/2016, 8:53 AM

## 2016-02-28 NOTE — Consult Note (Signed)
Urology Consult  Referring physician: Corky Downs Reason for referral: Catheter and scrotal edema  Chief Complaint: Catheter and scrotal edema  History of Present Illness: Admitted with CHF and currently diureses; significant cardiac history noted; ? Urology placed catheter but I could not locate the note; some of the details of the history are a bit unclear It appears patient has had a foley for re 5 weeks placed during an earlier admission; he describes a failed trial of voiding then Baseline months ago: nocturia x 1; flow reasonable; no UTI and no GU surgery Gross leg edema is improving according to wife Tolerates catheter well Draining well noted by patient and nursing staff  Modifying factors: There are no other modifying factors  Associated signs and symptoms: There are no other associated signs and symptoms Aggravating and relieving factors: There are no other aggravating or relieving factors Severity: Moderate Duration: Persistent   Past Medical History:  Diagnosis Date  . Arthritis   . Asbestosis ([place])    "no breathing problems" pt states worked in [employer] for many yrs and was exposed to asbestosis  . CHF (congestive heart failure) ([place])   . Coronary artery disease   . Dyspnea   . Hyperlipidemia   . Lumbar spondylosis   . Myocardial infarction   . PAF (paroxysmal atrial fibrillation) ([place])    Past Surgical History:  Procedure Laterality Date  . CARDIAC CATHETERIZATION N/A 02/02/2016   Procedure: Left Heart Cath and Coronary Angiography;  Surgeon: Jettie Booze, MD;  Location: [place] [facility];  Service: Cardiovascular;  Laterality: N/A;  . CARDIAC CATHETERIZATION N/A 02/02/2016   Procedure: Coronary Stent Intervention;  Surgeon: Jettie Booze, MD;  Location: [place];  Service: Cardiovascular;  Laterality: N/A;  . CARDIAC CATHETERIZATION N/A 02/02/2016   Procedure: IABP Insertion;  Surgeon: Jettie Booze, MD;  Location: [place] [facility];  Service: Cardiovascular;  Laterality: N/A;  . CATARACTS REMOVED     BIL  . ROTATOR CUFF REPAIR  2011   L SHOULDER  . TOTAL KNEE ARTHROPLASTY Right 10/03/2013   Procedure: RIGHT TOTAL KNEE ARTHROPLASTY;  Surgeon: Mauri Pole, MD;  Location: [facility];  Service: Orthopedics;  Laterality: Right;    Medications: I have reviewed the patient's current medications. Allergies:  Allergies  Allergen Reactions  . Morphine And Related Other (See Comments)    Confusion/ hallucinations     Family History  Problem Relation Age of Onset  . Cancer Mother    Social History:  reports that he has never smoked. He has never used smokeless tobacco. He reports that he does not drink alcohol or use drugs.  ROS: All systems are reviewed and negative except as noted. Rest negative  Physical Exam:  Vital signs in last 24 hours: Temp:  [97.4 F (36.3 C)-98.6 F (37 C)] 97.5 F (36.4 C) (11/24 0758) Pulse Rate:  [58-65] 64 (11/24 0758) Resp:  [16-18] 18 (11/24 0758) BP: (97-138)/(61-77) 109/66 (11/24 0758) SpO2:  [96 %-100 %] 98 % (11/24 0758) Weight:  [112.9 kg (248 lb 14.4 oz)] 112.9 kg (248 lb 14.4 oz) (11/24 0500)  Cardiovascular: Skin warm; not flushed Respiratory: Breaths quiet; no shortness of breath Abdomen: No masses Neurological: Normal sensation to touch Musculoskeletal: Normal motor function arms and legs Lymphatics: No inguinal adenopathy Skin: No rashes Genitourinary:moderate scrotal edema; no tenderness infection or cellulitis; circumcised; penile edema mild and no infection; legs swollen; catheter draining and secured well  Laboratory Data:  Results for orders  placed or performed during the hospital encounter of 02/24/16 (from the past 72 hour(s))  Basic metabolic panel     Status: Abnormal   Collection Time: 02/26/16  6:49 AM  Result Value Ref Range   Sodium 132 (L) 135 - 145 mmol/L   Potassium 4.0 3.5 - 5.1 mmol/L   Chloride 97 (L) 101 - 111 mmol/L   CO2 26 22 - 32  mmol/L   Glucose, Bld 113 (H) 65 - 99 mg/dL   BUN 27 (H) 6 - 20 mg/dL   Creatinine, Ser 1.09 0.61 - 1.24 mg/dL   Calcium 8.0 (L) 8.9 - 10.3 mg/dL   GFR calc non Af Amer >60 >60 mL/min   GFR calc Af Amer >60 >60 mL/min    Comment: (NOTE) The eGFR has been calculated using the CKD EPI equation. This calculation has not been validated in all clinical situations. eGFR's persistently <60 mL/min signify possible Chronic Kidney Disease.    Anion gap 9 5 - 15  Heparin level (unfractionated)     Status: None   Collection Time: 02/26/16  6:27 PM  Result Value Ref Range   Heparin Unfractionated 0.35 0.30 - 0.70 IU/mL    Comment:        IF HEPARIN RESULTS ARE BELOW EXPECTED VALUES, AND PATIENT DOSAGE HAS BEEN CONFIRMED, SUGGEST FOLLOW UP TESTING OF ANTITHROMBIN III LEVELS.   Basic metabolic panel     Status: Abnormal   Collection Time: 02/27/16  3:23 AM  Result Value Ref Range   Sodium 130 (L) 135 - 145 mmol/L   Potassium 4.0 3.5 - 5.1 mmol/L   Chloride 96 (L) 101 - 111 mmol/L   CO2 25 22 - 32 mmol/L   Glucose, Bld 164 (H) 65 - 99 mg/dL   BUN 27 (H) 6 - 20 mg/dL   Creatinine, Ser 1.23 0.61 - 1.24 mg/dL   Calcium 8.0 (L) 8.9 - 10.3 mg/dL   GFR calc non Af Amer 53 (L) >60 mL/min   GFR calc Af Amer >60 >60 mL/min    Comment: (NOTE) The eGFR has been calculated using the CKD EPI equation. This calculation has not been validated in all clinical situations. eGFR's persistently <60 mL/min signify possible Chronic Kidney Disease.    Anion gap 9 5 - 15  Heparin level (unfractionated)     Status: None   Collection Time: 02/27/16  3:23 AM  Result Value Ref Range   Heparin Unfractionated 0.36 0.30 - 0.70 IU/mL    Comment:        IF HEPARIN RESULTS ARE BELOW EXPECTED VALUES, AND PATIENT DOSAGE HAS BEEN CONFIRMED, SUGGEST FOLLOW UP TESTING OF ANTITHROMBIN III LEVELS.   CBC     Status: Abnormal   Collection Time: 02/27/16  3:23 AM  Result Value Ref Range   WBC 6.7 4.0 - 10.5 K/uL    RBC 3.35 (L) 4.22 - 5.81 MIL/uL   Hemoglobin 10.1 (L) 13.0 - 17.0 g/dL   HCT 30.9 (L) 39.0 - 52.0 %   MCV 92.2 78.0 - 100.0 fL   MCH 30.1 26.0 - 34.0 pg   MCHC 32.7 30.0 - 36.0 g/dL   RDW 14.4 11.5 - 15.5 %   Platelets 225 150 - 400 K/uL  Basic metabolic panel     Status: Abnormal   Collection Time: 02/28/16  5:39 AM  Result Value Ref Range   Sodium 132 (L) 135 - 145 mmol/L   Potassium 3.8 3.5 - 5.1 mmol/L   Chloride 96 (L)  101 - 111 mmol/L   CO2 27 22 - 32 mmol/L   Glucose, Bld 115 (H) 65 - 99 mg/dL   BUN 21 (H) 6 - 20 mg/dL   Creatinine, Ser 1.01 0.61 - 1.24 mg/dL   Calcium 8.0 (L) 8.9 - 10.3 mg/dL   GFR calc non Af Amer >60 >60 mL/min   GFR calc Af Amer >60 >60 mL/min    Comment: (NOTE) The eGFR has been calculated using the CKD EPI equation. This calculation has not been validated in all clinical situations. eGFR's persistently <60 mL/min signify possible Chronic Kidney Disease.    Anion gap 9 5 - 15  Heparin level (unfractionated)     Status: Abnormal   Collection Time: 02/28/16  5:39 AM  Result Value Ref Range   Heparin Unfractionated <0.10 (L) 0.30 - 0.70 IU/mL    Comment:        IF HEPARIN RESULTS ARE BELOW EXPECTED VALUES, AND PATIENT DOSAGE HAS BEEN CONFIRMED, SUGGEST FOLLOW UP TESTING OF ANTITHROMBIN III LEVELS. REPEATED TO VERIFY   CBC     Status: Abnormal   Collection Time: 02/28/16  5:39 AM  Result Value Ref Range   WBC 6.5 4.0 - 10.5 K/uL   RBC 3.58 (L) 4.22 - 5.81 MIL/uL   Hemoglobin 10.6 (L) 13.0 - 17.0 g/dL   HCT 33.3 (L) 39.0 - 52.0 %   MCV 93.0 78.0 - 100.0 fL   MCH 29.6 26.0 - 34.0 pg   MCHC 31.8 30.0 - 36.0 g/dL   RDW 14.5 11.5 - 15.5 %   Platelets 227 150 - 400 K/uL   Recent Results (from the past 240 hour(s))  Urine culture     Status: Abnormal   Collection Time: 02/18/16  9:28 PM  Result Value Ref Range Status   Specimen Description URINE, CLEAN CATCH  Final   Special Requests NONE  Final   Culture >=100,000 COLONIES/mL ENTEROCOCCUS  FAECALIS (A)  Final   Report Status 02/21/2016 FINAL  Final   Organism ID, Bacteria ENTEROCOCCUS FAECALIS (A)  Final      Susceptibility   Enterococcus faecalis - MIC*    AMPICILLIN <=2 SENSITIVE Sensitive     LEVOFLOXACIN 0.5 SENSITIVE Sensitive     NITROFURANTOIN <=16 SENSITIVE Sensitive     VANCOMYCIN 1 SENSITIVE Sensitive     * >=100,000 COLONIES/mL ENTEROCOCCUS FAECALIS  MRSA PCR Screening     Status: None   Collection Time: 02/24/16  9:10 PM  Result Value Ref Range Status   MRSA by PCR NEGATIVE NEGATIVE Final    Comment:        The GeneXpert MRSA Assay (FDA approved for NASAL specimens only), is one component of a comprehensive MRSA colonization surveillance program. It is not intended to diagnose MRSA infection nor to guide or monitor treatment for MRSA infections.    Creatinine:  Recent Labs  02/24/16 1409 02/24/16 1610 02/25/16 0443 02/26/16 0649 02/27/16 0323 02/28/16 0539  CREATININE 1.11 1.32* 1.18 1.09 1.23 1.01    Xrays: See report/chart none  Impression/Assessment:  Scrotal edema - due to diffuse fluid retention and dependent area; has excellent prognosis to normalize if body fluid responds well to diuresis Retention- either from prostate o rpoor  Bladder contractility  Bladder retention not related to diffuse edema  Plan:  DO NOT remove catheter OK to elevate scrotum with towel to help edema (sometimes helps) Send home on flomax 0.4 mg daily IF OK with cardiac status Trial of voiding as an outpt- see  Dr Matilde Sprang as outpt Will follow Sorry re any consult issues   Date of admission: 02/24/2016

## 2016-02-28 NOTE — Progress Notes (Signed)
Physical Therapy Treatment Patient Details Name: Danny LatheJames Saffo MRN: [id number] DOB: 06-Jun-1931 Today's Date: 02/28/2016    History of Present Illness Patient is an 82 y/o male with hx of STEMI, Rt TKA and HLD presents with 30 lb weight gain, abdominal distention, orthopnea and paroxysmal nocturnal dyspnea. Found to have acute on chronic CHF.     PT Comments    Patient reports feeling weak today.  Continued to require mod assist for mobility.  Agree with HHPT f/u if patient has 24 hour assist of family - unsure that wife can provide needed assist for mobility on her own.   If family unable to provide 24 hour assist, patient may need to consider ST-SNF.   Follow Up Recommendations  Home health PT;Supervision for mobility/OOB;Supervision/Assistance - 24 hour     Equipment Recommendations  None recommended by PT    Recommendations for Other Services       Precautions / Restrictions Precautions Precautions: Fall Restrictions Weight Bearing Restrictions: No    Mobility  Bed Mobility Overal bed mobility: Needs Assistance Bed Mobility: Supine to Sit     Supine to sit: Mod assist;HOB elevated     General bed mobility comments: Verbal cues for technique.  Required assist to move hips toward EOB, and mod assist to raise trunk to upright position.  Patient able to scoot to EOB in sitting with increased time.  Transfers Overall transfer level: Needs assistance Equipment used: Rolling walker (2 wheeled) Transfers: Sit to/from Stand Sit to Stand: Min assist;From elevated surface         General transfer comment: Assist to boost from elevated bed height with cues for momentum and hand placement. Difficulty standing from low surfaces. Transferred to chair post ambulation.  Ambulation/Gait Ambulation/Gait assistance: Min assist Ambulation Distance (Feet): 120 Feet Assistive device: Rolling walker (2 wheeled) Gait Pattern/deviations: Step-through pattern;Decreased stride  length;Shuffle;Trunk flexed Gait velocity: decreased Gait velocity interpretation: Below normal speed for age/gender General Gait Details: Verbal cues to stand upright.  Assist for balance/safety.  Patient with very slow gait today.  O2 sats remained in 90's during gait on room air.   Stairs            Wheelchair Mobility    Modified Rankin (Stroke Patients Only)       Balance           Standing balance support: Bilateral upper extremity supported Standing balance-Leahy Scale: Poor                      Cognition Arousal/Alertness: Awake/alert Behavior During Therapy: WFL for tasks assessed/performed Overall Cognitive Status: Impaired/Different from baseline Area of Impairment: Memory;Safety/judgement     Memory: Decreased short-term memory   Safety/Judgement: Decreased awareness of deficits;Decreased awareness of safety          Exercises      General Comments        Pertinent Vitals/Pain Pain Assessment: No/denies pain    Home Living                      Prior Function            PT Goals (current goals can now be found in the care plan section) Acute Rehab PT Goals Patient Stated Goal: Go home Progress towards PT goals: Progressing toward goals    Frequency    Min 3X/week      PT Plan Current plan remains appropriate    Co-evaluation  End of Session Equipment Utilized During Treatment: Gait belt Activity Tolerance: Patient limited by fatigue Patient left: in chair;with call bell/phone within reach;with family/visitor present     Time: 1341-1401 PT Time Calculation (min) (ACUTE ONLY): 20 min  Charges:  $Gait Training: 8-22 mins                    G Codes:      Vena Austria 03-29-16, 2:39 PM Durenda Hurt. Renaldo Fiddler, [facility] Acute Rehab Services Pager [phone]

## 2016-02-28 NOTE — Progress Notes (Signed)
Patient Name: Danny Oneill Date of Encounter: 02/28/2016  Admit: 11/20  Primary Cardiologist: Dr Rachelle Hora Problem List     Principal Problem:   Acute on chronic systolic heart failure [name]) Active Problems:   Pericardial effusion   PAF (paroxysmal atrial fibrillation) (HCC)   CAD (coronary artery disease)   Cardiomyopathy, ischemic     Subjective   Not breathing any better. Still sob, no palps.   Inpatient Medications    Scheduled Meds: . amiodarone  400 mg Oral BID  . aspirin EC  81 mg Oral Q breakfast  . atorvastatin  20 mg Oral q1800  . carvedilol  3.125 mg Oral BID WC  . furosemide  40 mg Intravenous Daily  . Melatonin  1 tablet Oral QHS  . pantoprazole  40 mg Oral Daily  . sodium chloride flush  3 mL Intravenous Q12H  . tamsulosin  0.4 mg Oral QPC supper  . ticagrelor  90 mg Oral BID   Continuous Infusions:  PRN Meds: sodium chloride, acetaminophen, ondansetron (ZOFRAN) IV, sodium chloride flush   Vital Signs    Vitals:   02/27/16 2129 02/28/16 0038 02/28/16 0500 02/28/16 0758  BP: 138/77 109/63 100/61 109/66  Pulse: 65 64 61 64  Resp:    18  Temp: 97.8 F (36.6 C) 98.6 F (37 C) 98.1 F (36.7 C) 97.5 F (36.4 C)  TempSrc: Oral Oral Oral Oral  SpO2: 100% 100% 100% 98%  Weight:   248 lb 14.4 oz (112.9 kg)   Height:        Intake/Output Summary (Last 24 hours) at 02/28/16 0849 Last data filed at 02/27/16 1700  Gross per 24 hour  Intake              120 ml  Output             2400 ml  Net            -2280 ml   Filed Weights   02/26/16 0525 02/27/16 0611 02/28/16 0500  Weight: 241 lb 9.6 oz (109.6 kg) 249 lb 8 oz (113.2 kg) 248 lb 14.4 oz (112.9 kg)    Physical Exam    GEN: Well nourished, well developed, in no acute distress.  HEENT: Grossly normal.  Neck: Supple, JVD 9 cm, no carotid bruits, or masses. Cardiac: RRR, no murmurs, rubs, or gallops. No clubbing, cyanosis, 1+ LE edema.  Radials/DP/PT 2+ and equal bilaterally.    Respiratory:  Respirations regular and unlabored, rales bases bilaterally. GI: Soft, nontender, nondistended, BS + x 4. MS: no deformity or atrophy. Skin: warm and dry, no rash. Neuro:  Strength and sensation are intact. Psych: AAOx3.  Normal affect.  Labs    CBC  Recent Labs  02/27/16 0323 02/28/16 0539  WBC 6.7 6.5  HGB 10.1* 10.6*  HCT 30.9* 33.3*  MCV 92.2 93.0  PLT 225 227   Basic Metabolic Panel  Recent Labs  02/27/16 0323 02/28/16 0539  NA 130* 132*  K 4.0 3.8  CL 96* 96*  CO2 25 27  GLUCOSE 164* 115*  BUN 27* 21*  CREATININE 1.23 1.01  CALCIUM 8.0* 8.0*   Liver Function Tests Lab Results  Component Value Date   ALT 56 (H) 02/24/2016   AST 44 (H) 02/24/2016   ALKPHOS 50 02/24/2016   BILITOT 1.0 02/24/2016   Thyroid Function Tests No results found for: TSH   Telemetry    SR - Personally Reviewed  ECG  n/a - Personally Reviewed  Radiology    No results found.  Cardiac Studies   ECHO 11/20 - Left ventricle: The cavity size was normal. Systolic function was mildly to moderately reduced. The estimated ejection fraction was in the range of 40% to 45%. Hypokinesis of the inferior and inferoseptal myocardium. Doppler parameters are consistent with abnormal left ventricular relaxation (grade 1 diastolicdysfunction). - Aortic valve: There was mild regurgitation. - Pulmonary arteries: PA peak pressure: 31 mm Hg (S). - Pericardium, extracardiac: A moderate to large, free-flowing pericardial effusion was identified circumferential to the heart. The fluid had no internal echoes. There was mildright ventricular chamber collapse for less than 50% of the cardiac cycle. Respirophasic change in stroke volume was excessive. Features were consistent with mild tamponade physiology.  ECHO: 11/23 - Left ventricle: The cavity size was normal. Wall thickness was   increased in a pattern of mild LVH. Systolic function was mildly   to  moderately reduced. The estimated ejection fraction was in the   range of 40% to 45%. There is akinesis of the mid-apicalinferior   and inferoseptal myocardium. Features are consistent with a   pseudonormal left ventricular filling pattern, with concomitant   abnormal relaxation and increased filling pressure (grade 2   diastolic dysfunction). - Aortic valve: Mildly to moderately calcified annulus. Trileaflet;   mildly calcified leaflets. There was mild to moderate   regurgitation. - Mitral valve: Calcified annulus. There was mild regurgitation. - Right ventricle: The cavity size was mildly dilated. - Right atrium: Central venous pressure (est): 15 mm Hg. - Tricuspid valve: There was trivial regurgitation. - Pulmonary arteries: PA peak pressure: 32 mm Hg (S). - Pericardium, extracardiac: Moderate to large, circumferential   pericardial effusion with evidence of organization anteriorly.   Compared to the previous study from 02/24/2016 there has been   some improvement anteriorly. Mitral inflow pattern does not   suggest definitive tamponade physiology. IVC is dilated however. Impressions: - Limited study to follow-up pericardial effusion. There is mild   LVH with LVEF 40-45%, mid to apical inferior and inferoseptal   akinesis noted. Grade 2 diastolic dysfunction. Sclerotic valve   with mild to moderate aortic regurgitation. Mild mitral   regurgitation. Mildly dilated right ventricle. Trivial tricuspid   regurgitation with PASP 32 mmHg. Moderate to large,   circumferential pericardial effusion with evidence of   organization anteriorly. Compared to the previous study from   02/24/2016 there has been some improvement anteriorly. Mitral   inflow pattern does not suggest definitive tamponade physiology.   IVC is dilated however.  Patient Profile     82 year old male with history of inferolateral STEMI in late October s/p DES to the OM 2 and complicated by shock>>AIBP>>cardiomyopathy.  He has had recent trouble with bladder obstruction requiring Foley catheter, also substantial weight gain and ultimately diagnosis of a large pericardial effusion. He has had atrial fibrillation during hospital stay, now in sinus rhythm on amiodarone.  Assessment & Plan    1. Moderate to large pericardial effusion by echocardiogram on November 20. Follow-up limited study results above. He has been on IV Lasix diuresis but breathing is no better, increase from 40 mg qd to 60 mg IV BID. However, BP may limit Lasix use. Pericardiocentesis has been considered if he does not show improvement. Will discuss with MD  2. Paroxysmal atrial fibrillation noted during this hospital stay, possibly related to problem #1. He is now holding sinus rhythm on IV amiodarone. He was also started on IV heparin.  CHADSVASC score is 5.  3. CAD status post recent inferolateral STEMI in late October treated with DES to the OM 2. No active angina symptoms. He is on aspirin and Brilinta.  4. Bladder outlet obstruction with Foley catheter in place.  5. Acute on chronic systolic heart failure with volume overload. I/O neg by 5.9 L so far but breathing not much better. See above.  Melida Quitter, PA-C  02/28/2016, 8:49 AM

## 2016-02-29 LAB — BASIC METABOLIC PANEL
Anion gap: 8 (ref 5–15)
BUN: 20 mg/dL (ref 6–20)
CALCIUM: 8 mg/dL — AB (ref 8.9–10.3)
CO2: 30 mmol/L (ref 22–32)
CREATININE: 1 mg/dL (ref 0.61–1.24)
Chloride: 95 mmol/L — ABNORMAL LOW (ref 101–111)
GFR calc Af Amer: >60 mL/min (ref >60)
GLUCOSE: 108 mg/dL — AB (ref 65–99)
Potassium: 3.4 mmol/L — ABNORMAL LOW (ref 3.5–5.1)
Sodium: 133 mmol/L — ABNORMAL LOW (ref 135–145)

## 2016-02-29 MED ORDER — FUROSEMIDE 10 MG/ML IJ SOLN
60.0000 mg | Freq: Every day | INTRAMUSCULAR | Status: DC
  Administered 2016-02-29 – 2016-03-01 (×2): 60 mg via INTRAVENOUS
  Filled 2016-02-29 (×2): qty 6

## 2016-02-29 MED ORDER — POTASSIUM CHLORIDE CRYS ER 20 MEQ PO TBCR
40.0000 meq | EXTENDED_RELEASE_TABLET | Freq: Once | ORAL | Status: AC
  Administered 2016-02-29: 40 meq via ORAL
  Filled 2016-02-29: qty 2

## 2016-02-29 NOTE — Evaluation (Signed)
Occupational Therapy Evaluation Patient Details Name: Danny Oneill MRN: [id number] DOB: 03-06-1932 Today's Date: 02/29/2016    History of Present Illness Patient is an 82 y/o male with hx of STEMI, Rt TKA and HLD presents with 30 lb weight gain, abdominal distention, orthopnea and paroxysmal nocturnal dyspnea. Found to have acute on chronic CHF.    Clinical Impression   At his baseline, pt is independent with AD or mobility and self care. Pt presents with generalized weakness, poor activity tolerance and decreased standing balance. He appeared somewhat confused, but difficult to assess thoroughly due to impaired hearing. Pt requires heavy min assist to stand from chair and to transfer. His wife is unable to physically assist him with mobility at home. Recommending ST rehab in SNF prior to return home, family and pt are in agreement. Will follow acutely.    Follow Up Recommendations  SNF;Supervision/Assistance - 24 hour    Equipment Recommendations       Recommendations for Other Services       Precautions / Restrictions Precautions Precautions: Fall Precaution Comments: pt with scrotal swelling and pain, RN to order sling Restrictions Weight Bearing Restrictions: No      Mobility Bed Mobility               General bed mobility comments: pt in chair  Transfers Overall transfer level: Needs assistance Equipment used: Rolling walker (2 wheeled) Transfers: Sit to/from Stand Sit to Stand: Min assist;From elevated surface         General transfer comment: heavy min assist to stand from chair, verbal cues for hand placement and to not hold his breath    Balance     Sitting balance-Leahy Scale: Fair       Standing balance-Leahy Scale: Poor Standing balance comment: reliant on B UEs                            ADL Overall ADL's : Needs assistance/impaired Eating/Feeding: Independent;Sitting   Grooming: Wash/dry hands;Wash/dry face;Sitting;Set  up   Upper Body Bathing: Minimal assitance;Sitting   Lower Body Bathing: Total assistance;Sit to/from stand   Upper Body Dressing : Minimal assistance;Sitting   Lower Body Dressing: Total assistance;Sit to/from stand   Toilet Transfer: Minimal assistance;Stand-pivot;RW;BSC   Toileting- Clothing Manipulation and Hygiene: Total assistance;Sit to/from stand       Functional mobility during ADLs: Minimal assistance;Rolling walker;Cueing for sequencing General ADL Comments: Pt has a "special way" of putting his socks on at home, does not use AE.     Vision     Perception     Praxis      Pertinent Vitals/Pain Pain Assessment: Faces Faces Pain Scale: Hurts whole lot Pain Location: scrotum Pain Descriptors / Indicators: Grimacing;Guarding;Sore Pain Intervention(s): Repositioned;Monitored during session     Hand Dominance Right   Extremity/Trunk Assessment Upper Extremity Assessment Upper Extremity Assessment: Overall WFL for tasks assessed   Lower Extremity Assessment Lower Extremity Assessment: Defer to PT evaluation       Communication Communication Communication: HOH   Cognition Arousal/Alertness: Awake/alert Behavior During Therapy: WFL for tasks assessed/performed Overall Cognitive Status: Impaired/Different from baseline Area of Impairment: Memory;Safety/judgement;Problem solving     Memory: Decreased short-term memory   Safety/Judgement: Decreased awareness of deficits;Decreased awareness of safety   Problem Solving: Requires verbal cues;Slow processing General Comments: pt not understanding the management of new CHF diagnosis   General Comments       Exercises  Shoulder Instructions      Home Living Family/patient expects to be discharged to:: Private residence Living Arrangements: Spouse/significant other Available Help at Discharge: Family;Available 24 hours/day Type of Home: House Home Access: Stairs to enter [employer]  of Steps: 2 Entrance Stairs-Rails: Right Home Layout: One level     Bathroom Shower/Tub: Chief Strategy Officer: Standard     Home Equipment: Environmental consultant - 2 wheels;Bedside commode;Shower seat   Additional Comments: wife is elderly and cannot physically assist pt with mobility      Prior Functioning/Environment Level of Independence: Independent with assistive device(s)        Comments: Pt had recent 5 days of rehab in SNF. Was home with HHPT. Fell with HHPT. Uses RW for ambulation.        OT Problem List: Decreased strength;Decreased activity tolerance;Impaired balance (sitting and/or standing);Decreased cognition;Decreased knowledge of use of DME or AE;Pain;Increased edema   OT Treatment/Interventions: Self-care/ADL training;DME and/or AE instruction;Patient/family education;Balance training;Cognitive remediation/compensation    OT Goals(Current goals can be found in the care plan section) Acute Rehab OT Goals Patient Stated Goal: Go home OT Goal Formulation: With patient Time For Goal Achievement: 03/14/16 Potential to Achieve Goals: Good  OT Frequency: Min 2X/week   Barriers to D/C: Decreased caregiver support          Co-evaluation              End of Session Equipment Utilized During Treatment: Gait belt;Rolling walker Nurse Communication:  (may benefit from scrotal sling)  Activity Tolerance: Patient tolerated treatment well Patient left: in chair;with call bell/phone within reach;with family/visitor present   Time: 1025-1053 OT Time Calculation (min): 28 min Charges:  OT General Charges $OT Visit: 1 Procedure OT Evaluation $OT Eval Moderate Complexity: 1 Procedure OT Treatments $Self Care/Home Management : 8-22 mins G-Codes:    Evern Bio 02/29/2016, 12:08 PM  [phone]

## 2016-02-29 NOTE — Progress Notes (Signed)
Scrotal edema ? Bit bette but benign with excellent prognosis Has Urology appointment Dec 12  Leave foley in place when he goes home

## 2016-02-29 NOTE — Progress Notes (Signed)
Patient Name: Danny Oneill Date of Encounter: 02/29/2016  Primary Cardiologist: Dr. Ozella Rockshomas Kelly  Hospital Problem List     Principal Problem:   Acute on chronic systolic heart failure [name](HCC) Active Problems:   Pericardial effusion   PAF (paroxysmal atrial fibrillation) (HCC)   CAD (coronary artery disease)   Cardiomyopathy, ischemic    Subjective   Feels weak, still complaining of leg edema and abdominal bloating. Foley catheter being left in place with scrotal edema. States that he does not feel ready to go home.  Inpatient Medications    Scheduled Meds: . amiodarone  400 mg Oral BID  . aspirin EC  81 mg Oral Q breakfast  . atorvastatin  20 mg Oral q1800  . carvedilol  3.125 mg Oral BID WC  . furosemide  40 mg Oral Daily  . Melatonin  1 tablet Oral QHS  . pantoprazole  40 mg Oral Daily  . sodium chloride flush  3 mL Intravenous Q12H  . tamsulosin  0.4 mg Oral QPC supper  . ticagrelor  90 mg Oral BID    PRN Meds: sodium chloride, acetaminophen, ondansetron (ZOFRAN) IV, sodium chloride flush   Vital Signs    Vitals:   02/29/16 0020 02/29/16 0416 02/29/16 0811 02/29/16 0814  BP: (!) 100/52 103/62 99/67   Pulse: (!) 58 (!) 57    Resp:      Temp: 98.4 F (36.9 C) 97.8 F (36.6 C)    TempSrc: Oral Oral    SpO2: 96% 99%  96%  Weight:  246 lb 12.8 oz (111.9 kg)    Height:        Intake/Output Summary (Last 24 hours) at 02/29/16 1142 Last data filed at 02/29/16 0418  Gross per 24 hour  Intake              180 ml  Output              925 ml  Net             -745 ml   Filed Weights   02/27/16 0611 02/28/16 0500 02/29/16 0416  Weight: 249 lb 8 oz (113.2 kg) 248 lb 14.4 oz (112.9 kg) 246 lb 12.8 oz (111.9 kg)    Physical Exam   Gen.: Obese male in no distress. HEENT: Conjunctiva and lids normal, oropharynx clear. Neck: Supple, elevated JVP, carotid bruits, no thyromegaly. Lungs: Clear to auscultation, nonlabored breathing at rest. Cardiac: Regular rate  and rhythm, no S3, no pericardial rub. Abdomen: Distended, nontender, bowel sounds present. Extremities: Pitting edema bilaterally, distal pulses 2+.  Labs    CBC  Recent Labs  02/27/16 0323 02/28/16 0539  WBC 6.7 6.5  HGB 10.1* 10.6*  HCT 30.9* 33.3*  MCV 92.2 93.0  PLT 225 227   Basic Metabolic Panel  Recent Labs  02/28/16 0539 02/29/16 0303  NA 132* 133*  K 3.8 3.4*  CL 96* 95*  CO2 27 30  GLUCOSE 115* 108*  BUN 21* 20  CREATININE 1.01 1.00  CALCIUM 8.0* 8.0*    Telemetry    I personally reviewed telemetry monitoring which shows normal sinus rhythm.  ECG    I personally reviewed the tracing from 02/26/2016 which showed rapid atrial fibrillation with low voltage and nonspecific ST-T changes.  Cardiac Studies   Echocardiogram 02/27/2016: Study Conclusions  - Left ventricle: The cavity size was normal. Wall thickness was   increased in a pattern of mild LVH. Systolic function was mildly   to  moderately reduced. The estimated ejection fraction was in the   range of 40% to 45%. There is akinesis of the mid-apicalinferior   and inferoseptal myocardium. Features are consistent with a   pseudonormal left ventricular filling pattern, with concomitant   abnormal relaxation and increased filling pressure (grade 2   diastolic dysfunction). - Aortic valve: Mildly to moderately calcified annulus. Trileaflet;   mildly calcified leaflets. There was mild to moderate   regurgitation. - Mitral valve: Calcified annulus. There was mild regurgitation. - Right ventricle: The cavity size was mildly dilated. - Right atrium: Central venous pressure (est): 15 mm Hg. - Tricuspid valve: There was trivial regurgitation. - Pulmonary arteries: PA peak pressure: 32 mm Hg (S). - Pericardium, extracardiac: Moderate to large, circumferential   pericardial effusion with evidence of organization anteriorly.   Compared to the previous study from 02/24/2016 there has been   some  improvement anteriorly. Mitral inflow pattern does not   suggest definitive tamponade physiology. IVC is dilated however.  Impressions:  - Limited study to follow-up pericardial effusion. There is mild   LVH with LVEF 40-45%, mid to apical inferior and inferoseptal   akinesis noted. Grade 2 diastolic dysfunction. Sclerotic valve   with mild to moderate aortic regurgitation. Mild mitral   regurgitation. Mildly dilated right ventricle. Trivial tricuspid   regurgitation with PASP 32 mmHg. Moderate to large,   circumferential pericardial effusion with evidence of   organization anteriorly. Compared to the previous study from   02/24/2016 there has been some improvement anteriorly. Mitral   inflow pattern does not suggest definitive tamponade physiology.   IVC is dilated however.  Patient Profile     82 year old male with history of inferolateral STEMI in late October managed with DES to the OM 2 and complicated by shock requiring AIBP with associated cardiomyopathy. He has had recent trouble with bladder obstruction requiring Foley catheter, also substantial weight gain and ultimately diagnosis of a large pericardial effusion. He has had atrial fibrillation during hospital stay, now in sinus rhythm on amiodarone.  Assessment & Plan    1. Moderate to large pericardial effusion by echocardiogram on November 20. Follow-up limited study Shows some improvement on IV Lasix. He is not in any distress to suggest active tamponade or need for pericardiocentesis at this time.  2. Paroxysmal atrial fibrillation noted during this hospital stay, possibly related to problem #1. He is holding sinus rhythm on oral amiodarone load. CHADSVASC score is 5. Not anticoagulated at this time in light of pericardial effusion.  3. CAD status post recent inferolateral STEMI in late October treated with DES to the OM 2. No active angina symptoms. He is on aspirin and Brilinta.  4. Bladder outlet obstruction with Foley  catheter in place. Also has scrotal edema. He has been evaluated by Urology. Plan is to leave in Foley catheter at discharge with follow-up as an outpatient.  5. Acute on chronic systolic heart failure with volume overload.  Discussed with patient, wife, and son. Not ready for discharge as yet. Plan to continue amiodarone load and low dose Coreg. Continue aspirin and Brilinta, but would not anticoagulate in light of pericardial effusion. I am changing his Lasix back to IV, still needs further diuresis.  Signed, Jonelle Sidle, M.D., F.A.C.C.  02/29/2016, 11:42 AM

## 2016-02-29 NOTE — Progress Notes (Signed)
Physical Therapy Treatment Patient Details Name: Danny Oneill MRN: [id number] DOB: 02-Sep-1931 Today's Date: 02/29/2016    History of Present Illness Patient is an 82 y/o male with hx of STEMI, Rt TKA and HLD presents with 30 lb weight gain, abdominal distention, orthopnea and paroxysmal nocturnal dyspnea. Found to have acute on chronic CHF.     PT Comments    Patient has been more active throughout day today.  Fatigued quickly during PT session. Per chart, patient will not have 24 hour assist in addition to wife.  Therefore, recommend ST-SNF at d/c for continued therapy with goal to reach supervision level to return home with wife.   Follow Up Recommendations  SNF;Supervision/Assistance - 24 hour     Equipment Recommendations  None recommended by PT    Recommendations for Other Services       Precautions / Restrictions Precautions Precautions: Fall Restrictions Weight Bearing Restrictions: No    Mobility  Bed Mobility Overal bed mobility: Needs Assistance Bed Mobility: Supine to Sit;Sit to Supine     Supine to sit: Mod assist;HOB elevated Sit to supine: Mod assist;HOB elevated   General bed mobility comments: Patient requiring assist to raise trunk to sitting.   To return to supine, assist to bring LE's onto bed.  Transfers Overall transfer level: Needs assistance Equipment used: Rolling walker (2 wheeled) Transfers: Sit to/from Stand Sit to Stand: Min assist;From elevated surface         General transfer comment: Patient using correct hand placement.  Assist to rise to standing and for balance initially.  Ambulation/Gait Ambulation/Gait assistance: Min assist Ambulation Distance (Feet): 86 Feet Assistive device: Rolling walker (2 wheeled) Gait Pattern/deviations: Step-through pattern;Decreased stride length;Shuffle;Trunk flexed Gait velocity: decreased Gait velocity interpretation: Below normal speed for age/gender General Gait Details: Verbal cues to  stand upright during gait.  Patient with slow gait pattern.  Fatigued quickly today.  O2 sats in 90's during gait on room air.  Probe occasionally not reading correctly - RN notified.   Stairs            Wheelchair Mobility    Modified Rankin (Stroke Patients Only)       Balance           Standing balance support: Bilateral upper extremity supported Standing balance-Leahy Scale: Poor                      Cognition Arousal/Alertness: Awake/alert Behavior During Therapy: WFL for tasks assessed/performed Overall Cognitive Status: Impaired/Different from baseline Area of Impairment: Memory;Safety/judgement;Problem solving     Memory: Decreased short-term memory   Safety/Judgement: Decreased awareness of deficits;Decreased awareness of safety   Problem Solving: Requires verbal cues;Slow processing      Exercises      General Comments        Pertinent Vitals/Pain Pain Assessment: Faces Faces Pain Scale: Hurts even more Pain Location: scrotum with mobility Pain Descriptors / Indicators: Grimacing;Guarding Pain Intervention(s): Repositioned    Home Living                      Prior Function            PT Goals (current goals can now be found in the care plan section) Progress towards PT goals: Progressing toward goals    Frequency    Min 3X/week      PT Plan Discharge plan needs to be updated    Co-evaluation  End of Session Equipment Utilized During Treatment: Gait belt Activity Tolerance: Patient limited by fatigue Patient left: in bed;with call bell/phone within reach;with family/visitor present     Time: 3875-6433 PT Time Calculation (min) (ACUTE ONLY): 23 min  Charges:  $Gait Training: 23-37 mins                    G Codes:      Vena Austria 03-17-16, 4:18 PM Durenda Hurt. Renaldo Fiddler, [facility] Doctor [facility] Acute Rehab Services Pager [phone]

## 2016-03-01 LAB — CBC
HCT: 33.5 % — ABNORMAL LOW (ref 39.0–52.0)
Hemoglobin: 10.8 g/dL — ABNORMAL LOW (ref 13.0–17.0)
MCH: 29.8 pg (ref 26.0–34.0)
MCHC: 32.2 g/dL (ref 30.0–36.0)
MCV: 92.5 fL (ref 78.0–100.0)
PLATELETS: 230 10*3/uL (ref 150–400)
RBC: 3.62 MIL/uL — ABNORMAL LOW (ref 4.22–5.81)
RDW: 14.3 % (ref 11.5–15.5)
WBC: 6.6 10*3/uL (ref 4.0–10.5)

## 2016-03-01 LAB — BASIC METABOLIC PANEL
ANION GAP: 10 (ref 5–15)
BUN: 21 mg/dL — AB (ref 6–20)
CALCIUM: 8.1 mg/dL — AB (ref 8.9–10.3)
CO2: 31 mmol/L (ref 22–32)
CREATININE: 1.12 mg/dL (ref 0.61–1.24)
Chloride: 93 mmol/L — ABNORMAL LOW (ref 101–111)
GFR calc Af Amer: >60 mL/min (ref >60)
GFR, EST NON AFRICAN AMERICAN: 59 mL/min — AB (ref >60)
GLUCOSE: 107 mg/dL — AB (ref 65–99)
Potassium: 4 mmol/L (ref 3.5–5.1)
Sodium: 134 mmol/L — ABNORMAL LOW (ref 135–145)

## 2016-03-01 NOTE — Progress Notes (Signed)
Patient Name: Danny LatheJames Oneill Date of Encounter: 03/01/2016  Primary Cardiologist: Dr. Ozella Rockshomas Kelly  Hospital Problem List     Principal Problem:   Acute on chronic systolic heart failure [facility](HCC) Active Problems:   Pericardial effusion   PAF (paroxysmal atrial fibrillation) (HCC)   CAD (coronary artery disease)   Cardiomyopathy, ischemic    Subjective   Feels better today, leg edema is improving. Still weak. No chest pain.  Inpatient Medications    Scheduled Meds: . amiodarone  400 mg Oral BID  . aspirin EC  81 mg Oral Q breakfast  . atorvastatin  20 mg Oral q1800  . carvedilol  3.125 mg Oral BID WC  . furosemide  60 mg Intravenous Daily  . Melatonin  1 tablet Oral QHS  . pantoprazole  40 mg Oral Daily  . sodium chloride flush  3 mL Intravenous Q12H  . tamsulosin  0.4 mg Oral QPC supper  . ticagrelor  90 mg Oral BID    PRN Meds: sodium chloride, acetaminophen, ondansetron (ZOFRAN) IV, sodium chloride flush   Vital Signs    Vitals:   02/29/16 2100 03/01/16 0030 03/01/16 0500 03/01/16 0801  BP: (!) 97/59 111/62 (!) 92/59   Pulse: 67 65 61   Resp: (!) 21 16 (!) 22   Temp: 98.6 F (37 C) 99.3 F (37.4 C) 97.9 F (36.6 C) 98.2 F (36.8 C)  TempSrc:    Oral  SpO2: 96% 98% 97%   Weight:   240 lb 3.2 oz (109 kg)   Height:        Intake/Output Summary (Last 24 hours) at 03/01/16 0847 Last data filed at 03/01/16 0500  Gross per 24 hour  Intake                0 ml  Output             2900 ml  Net            -2900 ml   Filed Weights   02/28/16 0500 02/29/16 0416 03/01/16 0500  Weight: 248 lb 14.4 oz (112.9 kg) 246 lb 12.8 oz (111.9 kg) 240 lb 3.2 oz (109 kg)    Physical Exam   Gen.: Obese male in no distress. HEENT: Conjunctiva and lids normal, oropharynx clear. Neck: Supple, elevated JVP, carotid bruits, no thyromegaly. Lungs: Clear to auscultation, nonlabored breathing at rest. Cardiac: Regular rate and rhythm, no S3, no pericardial rub. Abdomen:  Distended, nontender, bowel sounds present. Extremities: Pitting edema legs bilaterally - improved compared to yesterday, distal pulses 2+.  Labs    CBC  Recent Labs  02/28/16 0539 03/01/16 0425  WBC 6.5 6.6  HGB 10.6* 10.8*  HCT 33.3* 33.5*  MCV 93.0 92.5  PLT 227 230   Basic Metabolic Panel  Recent Labs  02/29/16 0303 03/01/16 0425  NA 133* 134*  K 3.4* 4.0  CL 95* 93*  CO2 30 31  GLUCOSE 108* 107*  BUN 20 21*  CREATININE 1.00 1.12  CALCIUM 8.0* 8.1*    Telemetry    I personally reviewed telemetry monitoring which shows normal sinus rhythm.  ECG    I personally reviewed the tracing from 02/26/2016 which showed rapid atrial fibrillation with low voltage and nonspecific ST-T changes.  Cardiac Studies   Echocardiogram 02/27/2016: Study Conclusions  - Left ventricle: The cavity size was normal. Wall thickness was   increased in a pattern of mild LVH. Systolic function was mildly   to moderately reduced. The estimated  ejection fraction was in the   range of 40% to 45%. There is akinesis of the mid-apicalinferior   and inferoseptal myocardium. Features are consistent with a   pseudonormal left ventricular filling pattern, with concomitant   abnormal relaxation and increased filling pressure (grade 2   diastolic dysfunction). - Aortic valve: Mildly to moderately calcified annulus. Trileaflet;   mildly calcified leaflets. There was mild to moderate   regurgitation. - Mitral valve: Calcified annulus. There was mild regurgitation. - Right ventricle: The cavity size was mildly dilated. - Right atrium: Central venous pressure (est): 15 mm Hg. - Tricuspid valve: There was trivial regurgitation. - Pulmonary arteries: PA peak pressure: 32 mm Hg (S). - Pericardium, extracardiac: Moderate to large, circumferential   pericardial effusion with evidence of organization anteriorly.   Compared to the previous study from 02/24/2016 there has been   some improvement  anteriorly. Mitral inflow pattern does not   suggest definitive tamponade physiology. IVC is dilated however.  Impressions:  - Limited study to follow-up pericardial effusion. There is mild   LVH with LVEF 40-45%, mid to apical inferior and inferoseptal   akinesis noted. Grade 2 diastolic dysfunction. Sclerotic valve   with mild to moderate aortic regurgitation. Mild mitral   regurgitation. Mildly dilated right ventricle. Trivial tricuspid   regurgitation with PASP 32 mmHg. Moderate to large,   circumferential pericardial effusion with evidence of   organization anteriorly. Compared to the previous study from   02/24/2016 there has been some improvement anteriorly. Mitral   inflow pattern does not suggest definitive tamponade physiology.   IVC is dilated however.  Patient Profile     82 year old male with history of inferolateral STEMI in late October managed with DES to the OM 2 and complicated by shock requiring AIBP with associated cardiomyopathy. He has had recent trouble with bladder obstruction requiring Foley catheter, also substantial weight gain and ultimately diagnosis of a large pericardial effusion. He has had atrial fibrillation during hospital stay, now in sinus rhythm on amiodarone.  Assessment & Plan    1. Moderate to large pericardial effusion by echocardiogram on November 20. Follow-up limited study shows some improvement on IV Lasix. He is not in any distress to suggest active tamponade or need for pericardiocentesis at this time.  2. Paroxysmal atrial fibrillation noted during this hospital stay, possibly related to problem #1. He is holding sinus rhythm on oral amiodarone load. CHADSVASC score is 5. Not anticoagulated at this time in light of pericardial effusion.  3. CAD status post recent inferolateral STEMI in late October treated with DES to the OM 2. No active angina symptoms. He is on aspirin and Brilinta.  4. Bladder outlet obstruction with Foley catheter in  place. Also has scrotal edema. He has been evaluated by Urology. Plan is to leave in Foley catheter at discharge with follow-up as an outpatient.  5. Acute on chronic systolic heart failure with volume overload. Transitioned back to IV Lasix yesterday and beginning to improve. He had a diuresis of 2900 cc last 24 hours. Still not at baseline.  Discussed with patient, wife, and son. Needs further diuresis and we will continue with present dose Lasix 60 mg IV daily. Continue with PT, need to better understand disposition requirements. Patient's family had questions about whether he needed rehabilitation stay.  Signed, Jonelle Sidle, M.D., F.A.C.C.  03/01/2016, 8:47 AM

## 2016-03-02 LAB — BASIC METABOLIC PANEL
Anion gap: 8 (ref 5–15)
BUN: 24 mg/dL — ABNORMAL HIGH (ref 6–20)
CALCIUM: 8 mg/dL — AB (ref 8.9–10.3)
CHLORIDE: 94 mmol/L — AB (ref 101–111)
CO2: 32 mmol/L (ref 22–32)
Creatinine, Ser: 1.11 mg/dL (ref 0.61–1.24)
GFR calc non Af Amer: 60 mL/min — ABNORMAL LOW (ref >60)
GLUCOSE: 105 mg/dL — AB (ref 65–99)
POTASSIUM: 3.8 mmol/L (ref 3.5–5.1)
Sodium: 134 mmol/L — ABNORMAL LOW (ref 135–145)

## 2016-03-02 LAB — CBC
HEMATOCRIT: 33.8 % — AB (ref 39.0–52.0)
HEMOGLOBIN: 10.7 g/dL — AB (ref 13.0–17.0)
MCH: 29.2 pg (ref 26.0–34.0)
MCHC: 31.7 g/dL (ref 30.0–36.0)
MCV: 92.3 fL (ref 78.0–100.0)
Platelets: 235 10*3/uL (ref 150–400)
RBC: 3.66 MIL/uL — AB (ref 4.22–5.81)
RDW: 14.2 % (ref 11.5–15.5)
WBC: 7.5 10*3/uL (ref 4.0–10.5)

## 2016-03-02 MED ORDER — FUROSEMIDE 10 MG/ML IJ SOLN
40.0000 mg | Freq: Two times a day (BID) | INTRAMUSCULAR | Status: DC
  Administered 2016-03-02 – 2016-03-05 (×5): 40 mg via INTRAVENOUS
  Filled 2016-03-02 (×5): qty 4

## 2016-03-02 NOTE — Progress Notes (Signed)
SUBJECTIVE: No chest pain or dyspnea  Tele: sinus  BP (!) 93/54   Pulse 63   Temp 98.2 F (36.8 C)   Resp 20   Ht 6\' 2"  (1.88 m)   Wt 239 lb (108.4 kg)   SpO2 97%   BMI 30.69 kg/m   Intake/Output Summary (Last 24 hours) at 03/02/16 0737 Last data filed at 03/02/16 0500  Gross per 24 hour  Intake              420 ml  Output             1975 ml  Net            -1555 ml    PHYSICAL EXAM General: Well developed, well nourished, in no acute distress. Alert and oriented x 3.  Psych:  Good affect, responds appropriately Neck: No JVD. No masses noted.  Lungs: Clear bilaterally with no wheezes or rhonci noted.  Heart: RRR with no murmurs noted. Abdomen: Bowel sounds are present. Soft, non-tender.  Extremities: 2+ bilateral lower extremity edema.   LABS: Basic Metabolic Panel:  Recent Labs  10/62/69 0425 03/02/16 0533  NA 134* 134*  K 4.0 3.8  CL 93* 94*  CO2 31 32  GLUCOSE 107* 105*  BUN 21* 24*  CREATININE 1.12 1.11  CALCIUM 8.1* 8.0*   CBC:  Recent Labs  03/01/16 0425 03/02/16 0533  WBC 6.6 7.5  HGB 10.8* 10.7*  HCT 33.5* 33.8*  MCV 92.5 92.3  PLT 230 235   Current Meds: . amiodarone  400 mg Oral BID  . aspirin EC  81 mg Oral Q breakfast  . atorvastatin  20 mg Oral q1800  . carvedilol  3.125 mg Oral BID WC  . furosemide  60 mg Intravenous Daily  . Melatonin  1 tablet Oral QHS  . pantoprazole  40 mg Oral Daily  . sodium chloride flush  3 mL Intravenous Q12H  . tamsulosin  0.4 mg Oral QPC supper  . ticagrelor  90 mg Oral BID    ASSESSMENT AND PLAN: 82 year old male with history of inferolateral STEMI in late October managed with DES to the OM 2 and complicated by shock with associated cardiomyopathy. He has had recent trouble with bladder obstruction requiring Foley catheter, also substantial weight gain and ultimately diagnosis of a large pericardial effusion. He has had atrial fibrillation during hospital stay, now in sinus rhythm on  amiodarone.  1. Moderate to large pericardial effusion: Echocardiogram on November 20 demonstrated large effusion and f/u echo 02/27/16 demonstrated perhaps mild improvement with diuresis. He is not in any distress to suggest active tamponade or need for pericardiocentesis at this time. Will repeat echo today to reassess. He may end up needing pericardiocentesis before discharge.   2. Paroxysmal atrial fibrillation: He is maintaining sinus rhythm on oral amiodarone. CHADSVASC score is 5. Not anticoagulated at this time in light of pericardial effusion.   3. CAD/Inferolateral STEMI: He is s/p inferolateral STEMI in October treated with DES to the OM 2. No active angina symptoms. He is on aspirin and Brilinta, beta blocker and statin.   4. Bladder outlet obstruction with foley catheter in place: He also has scrotal edema. He has been evaluated by Urology. Plan is to leave in Foley catheter at discharge with follow-up as an outpatient.  5. Acute on chronic systolic heart failure: He was transitioned back to IV Lasix 02/29/16. Net negative 1.5 liters last 24 hours. He still has evidence of  volume overload with lower ext edema. Will continue IV Lasix today.   He has been seen by PT. Will ask PT to see him today. He will need SNF at discharge. I would estimate later this week.       Danny CarrowChristopher Oneill  11/27/20178:28 AM

## 2016-03-02 NOTE — NC FL2 (Signed)
[place] MEDICAID FL2 LEVEL OF CARE SCREENING TOOL     IDENTIFICATION  Patient Name: Danny Oneill Birthdate: May 19, 1931 Sex: male Admission Date (Current Location): 02/24/2016  [facility] and [place] Number:  Producer, television/film/video and Address:  The . [facility], [address] [place], [place] [address]      Provider Number: [id number]  Attending Physician Name and Address:  Lennette Bihari, MD  Relative Name and Phone Number:  Juston, Raimondo, [phone]    Current Level of Care: Hospital Recommended Level of Care: Skilled Nursing Facility Prior Approval Number:    Date Approved/Denied:   PASRR Number: [id number]  Discharge Plan: SNF    Current Diagnoses: Patient Active Problem List   Diagnosis Date Noted  . Pericardial effusion 02/27/2016  . PAF (paroxysmal atrial fibrillation) (HCC) 02/27/2016  . CAD (coronary artery disease) 02/27/2016  . Cardiomyopathy, ischemic 02/27/2016  . Acute on chronic systolic heart failure (HCC) 02/24/2016  . Systolic and diastolic CHF, acute (HCC) 02/13/2016  . Anterior and lateral ST segment elevation (HCC) 02/13/2016  . ARF (acute renal failure) (HCC) 02/13/2016  . Acute urinary retention 02/13/2016  . Hematuria 02/13/2016  . Elevated liver enzymes 02/13/2016  . Bilateral lower extremity edema 02/13/2016  . Low BP 02/13/2016  . Hyperlipidemia 02/13/2016  . Acute ST elevation myocardial infarction (STEMI) (HCC)   . Acute inferolateral myocardial infarction (HCC) 02/02/2016  . Acute MI, inferolateral wall, initial episode of care (HCC)   . Arterial hypotension   . Overweight (BMI 25.0-29.9) 10/04/2013  . Expected blood loss anemia 10/04/2013  . S/P right TKA 10/03/2013    Orientation RESPIRATION BLADDER Height & Weight     Self, Time, Situation, Place  Normal Continent Weight: 108.4 kg (239 lb) Height:  6\' 2"  (188 cm)  BEHAVIORAL SYMPTOMS/MOOD NEUROLOGICAL BOWEL NUTRITION STATUS   (NONE)  (NONE)  Continent Diet (Heart Healthy Carb Modified)  AMBULATORY STATUS COMMUNICATION OF NEEDS Skin   Limited Assist Verbally Normal                       Personal Care Assistance Level of Assistance  Bathing, Feeding, Dressing Bathing Assistance: Limited assistance Feeding assistance: Independent Dressing Assistance: Limited assistance     Functional Limitations Info  Sight, Hearing, Speech Sight Info: Adequate Hearing Info: Adequate Speech Info: Adequate    SPECIAL CARE FACTORS FREQUENCY  PT (By licensed PT), OT (By licensed OT)     PT Frequency: 5 OT Frequency: 5            Contractures Contractures Info: Not present    Additional Factors Info  Code Status, Allergies Code Status Info: Full Code Allergies Info: Morphine and Related           Current Medications (03/02/2016):  This is the current hospital active medication list Current Facility-Administered Medications  Medication Dose Route Frequency Provider Last Rate Last Dose  . 0.9 %  sodium chloride infusion  250 mL Intravenous PRN Azalee Course, PA      . acetaminophen (TYLENOL) tablet 650 mg  650 mg Oral Q4H PRN Azalee Course, PA   650 mg at 02/28/16 1820  . amiodarone (PACERONE) tablet 400 mg  400 mg Oral BID Jonelle Sidle, MD   400 mg at 03/01/16 2137  . aspirin EC tablet 81 mg  81 mg Oral Q breakfast Azalee Course, [place]   81 mg at 03/01/16 0925  . atorvastatin (LIPITOR) tablet 20 mg  20 mg  Oral q1800 Azalee CourseHao Meng, [place]   20 mg at 03/01/16 1719  . carvedilol (COREG) tablet 3.125 mg  3.125 mg Oral BID WC Azalee CourseHao Meng, PA   3.125 mg at 03/01/16 1719  . furosemide (LASIX) injection 60 mg  60 mg Intravenous Daily Jonelle SidleSamuel G McDowell, MD   60 mg at 03/01/16 0924  . Melatonin TABS 3 mg  1 tablet Oral QHS Azalee CourseHao Meng, PA   3 mg at 03/01/16 2137  . ondansetron (ZOFRAN) injection 4 mg  4 mg Intravenous Q6H PRN Azalee CourseHao Meng, PA      . pantoprazole (PROTONIX) EC tablet 40 mg  40 mg Oral Daily Azalee CourseHao Meng, [place]   40 mg at 03/01/16 0925  . sodium chloride  flush (NS) 0.9 % injection 3 mL  3 mL Intravenous Q12H Azalee CourseHao Meng, PA   3 mL at 03/01/16 2137  . sodium chloride flush (NS) 0.9 % injection 3 mL  3 mL Intravenous PRN Azalee CourseHao Meng, PA      . tamsulosin (FLOMAX) capsule 0.4 mg  0.4 mg Oral QPC supper Azalee CourseHao Meng, PA   0.4 mg at 03/01/16 1719  . ticagrelor (BRILINTA) tablet 90 mg  90 mg Oral BID Azalee CourseHao Meng, [place]   90 mg at 03/01/16 2137     Discharge Medications: Please see discharge summary for a list of discharge medications.  Relevant Imaging Results:  Relevant Lab Results:   Additional Information SSN: [id number]  Venita Lickampbell, Joseph B, LCSW

## 2016-03-02 NOTE — Progress Notes (Signed)
Physical Therapy Treatment Patient Details Name: Danny Oneill MRN: [id number] DOB: 1931-06-14 Today's Date: 03/02/2016    History of Present Illness Patient is an 82 y/o male with hx of STEMI, Rt TKA and HLD presents with 30 lb weight gain, abdominal distention, orthopnea and paroxysmal nocturnal dyspnea. Found to have acute on chronic CHF.     PT Comments    Pt progressing well, ambulated 300' with RW and min-guard A, however, still fatigues quickly, needed standing rest break x2, and is hindered by scrotal pain and swelling. Still feel that ST SNF would be safest option for pt for rehab before home, however, if he refuses this, recommend HHPT. PT will continue to follow.   Follow Up Recommendations  SNF;Supervision/Assistance - 24 hour     Equipment Recommendations  None recommended by PT    Recommendations for Other Services       Precautions / Restrictions Precautions Precautions: Fall Precaution Comments: continues to have scrotal pain and swelling, is not wearing sling that was brought to room Restrictions Weight Bearing Restrictions: No    Mobility  Bed Mobility Overal bed mobility: Needs Assistance Bed Mobility: Supine to Sit     Supine to sit: Supervision     General bed mobility comments: able to get to EOB with increased time but no physical assist, limited by scrotal pain with mvmt in bed  Transfers Overall transfer level: Needs assistance Equipment used: Rolling walker (2 wheeled) Transfers: Sit to/from Stand Sit to Stand: Min guard;From elevated surface         General transfer comment: pt with good effort with transfers today and was able to stand from bed without physical assist.   Ambulation/Gait Ambulation/Gait assistance: Min guard Ambulation Distance (Feet): 300 Feet Assistive device: Rolling walker (2 wheeled) Gait Pattern/deviations: Step-through pattern;Decreased stride length;Trunk flexed Gait velocity: decreased Gait velocity  interpretation: <1.8 ft/sec, indicative of risk for recurrent falls General Gait Details: vc's for forward gaze and erect posture, 2 standing rest breaks   Stairs            Wheelchair Mobility    Modified Rankin (Stroke Patients Only)       Balance Overall balance assessment: Needs assistance Sitting-balance support: No upper extremity supported Sitting balance-Leahy Scale: Good     Standing balance support: Bilateral upper extremity supported Standing balance-Leahy Scale: Poor                      Cognition Arousal/Alertness: Awake/alert Behavior During Therapy: WFL for tasks assessed/performed Overall Cognitive Status: Impaired/Different from baseline Area of Impairment: Memory     Memory: Decreased short-term memory       Problem Solving: Slow processing;Requires verbal cues      Exercises General Exercises - Lower Extremity Ankle Circles/Pumps: AROM;Both;20 reps;Seated Long Arc Quad: AROM;Both;20 reps;Seated Heel Raises: AROM;Both;20 reps;Seated    General Comments General comments (skin integrity, edema, etc.): VSS. Pillows placed in recliner to assist sit to stand. Spoke to nursing about walking with him 1-3 more times today as pt requests      Pertinent Vitals/Pain Pain Assessment: Faces Faces Pain Scale: Hurts little more Pain Location: scrotum Pain Descriptors / Indicators: Grimacing;Guarding Pain Intervention(s): Limited activity within patient's tolerance;Repositioned;Monitored during session    Home Living                      Prior Function            PT Goals (current goals can now  be found in the care plan section) Acute Rehab PT Goals Patient Stated Goal: Go home PT Goal Formulation: With patient Time For Goal Achievement: 03/10/16 Potential to Achieve Goals: Good Progress towards PT goals: Progressing toward goals    Frequency    Min 3X/week      PT Plan Current plan remains appropriate     Co-evaluation             End of Session Equipment Utilized During Treatment: Gait belt Activity Tolerance: Patient tolerated treatment well Patient left: in chair;with call bell/phone within reach;with family/visitor present     Time: 1033-1100 PT Time Calculation (min) (ACUTE ONLY): 27 min  Charges:  $Gait Training: 23-37 mins                    G Codes:     Lyanne Co, PT  Acute Rehab Services  [phone]  Danny Oneill 03/02/2016, 1:16 PM

## 2016-03-02 NOTE — Clinical Social Work Placement (Signed)
   CLINICAL SOCIAL WORK PLACEMENT  NOTE  Date:  03/02/2016  Patient Details  Name: Danny Oneill MRN: [id number] Date of Birth: October 09, 1931  Clinical Social Work is seeking post-discharge placement for this patient at the Skilled  Nursing Facility level of care (*CSW will initial, date and re-position this form in  chart as items are completed):  Yes   Patient/family provided with [place] Clinical Social Work Department's list of facilities offering this level of care within the geographic area requested by the patient (or if unable, by the patient's family).  Yes   Patient/family informed of their freedom to choose among providers that offer the needed level of care, that participate in Medicare, Medicaid or managed care program needed by the patient, have an available bed and are willing to accept the patient.  Yes   Patient/family informed of [facility]'s ownership interest in [name] and [facility], as well as of the fact that they are under no obligation to receive care at these facilities.  PASRR submitted to EDS on       PASRR number received on       Existing PASRR number confirmed on 03/02/16     FL2 transmitted to all facilities in geographic area requested by pt/family on 03/02/16     FL2 transmitted to all facilities within larger geographic area on       Patient informed that his/her managed care company has contracts with or will negotiate with certain facilities, including the following:        Yes   Patient/family informed of bed offers received.  Patient chooses bed at [facility]     Physician recommends and patient chooses bed at      Patient to be transferred to [name] on  .  Patient to be transferred to facility by       Patient family notified on   of transfer.  Name of family member notified:        PHYSICIAN Please prepare priority discharge summary, including medications, Please prepare prescriptions, Please sign FL2      Additional Comment:    _______________________________________________ Venita Lick, LCSW 03/02/2016, 4:17 PM

## 2016-03-02 NOTE — Telephone Encounter (Signed)
Ok, but may not be related to lisinopril, or may need reduced dose

## 2016-03-02 NOTE — Progress Notes (Addendum)
Occupational Therapy Treatment Patient Details Name: Nanayaw Wennberg MRN: [id number] DOB: [birth year] Today's Date: 03/02/2016    History of present illness Patient is an 82 y/o male with hx of STEMI, Rt TKA and HLD presents with 30 lb weight gain, abdominal distention, orthopnea and paroxysmal nocturnal dyspnea. Found to have acute on chronic CHF.    OT comments  Pt currently requires mod A for LB ADLs and min guard to min A for functional mobility.  He fatigues with activity requiring frequent rest breaks and requires assist for safety when maneuvering obstacles.  He is at very high risk for fall, injury and readmission as wife is not able to provide necessary level of assist safely.  Recommend short term rehab at SNF to allow him to return home  at mod I level, and thus reduce risk of falls    Follow Up Recommendations  SNF;Supervision/Assistance - 24 hour    Equipment Recommendations  3 in 1 bedside comode    Recommendations for Other Services      Precautions / Restrictions Precautions Precautions: Fall Precaution Comments: continues to have scrotal pain and swelling, is not wearing sling that was brought to room       Mobility Bed Mobility Overal bed mobility: Needs Assistance Bed Mobility: Supine to Sit;Sit to Supine     Supine to sit: Min assist Sit to supine: Min assist   General bed mobility comments: assist to lift trunk and assist to lift LEs onto bed   Transfers Overall transfer level: Needs assistance Equipment used: Rolling walker (2 wheeled) Transfers: Sit to/from [employer] Sit to Stand: Min guard Stand pivot transfers: Min guard            Balance Overall balance assessment: Needs assistance Sitting-balance support: Feet supported Sitting balance-Leahy Scale: Good     Standing balance support: Single extremity supported;During functional activity Standing balance-Leahy Scale: Poor                     ADL Overall ADL's :  Needs assistance/impaired     Grooming: Wash/dry hands;Wash/dry face;Oral care;Min guard;Standing       Lower Body Bathing: Moderate assistance;Sit to/from stand       Lower Body Dressing: Maximal assistance;Sit to/from stand Lower Body Dressing Details (indicate cue type and reason): Pt limited by scrotal pain  Toilet Transfer: Minimal assistance;Ambulation;Comfort height toilet;Grab bars;RW Statistician Details (indicate cue type and reason): min A to maneuver safely with RW in small space  Toileting- Clothing Manipulation and Hygiene: Moderate assistance;Sit to/from stand Toileting - Clothing Manipulation Details (indicate cue type and reason): decreased thoroughness      Functional mobility during ADLs: Min guard;Minimal assistance;Rolling walker        Vision                     Perception     Praxis      Cognition   Behavior During Therapy: WFL for tasks assessed/performed Overall Cognitive Status: Impaired/Different from baseline Area of Impairment: Memory     Memory: Decreased short-term memory        Problem Solving: Slow processing;Requires verbal cues      Extremity/Trunk Assessment               Exercises     Shoulder Instructions       General Comments      Pertinent Vitals/ Pain       Pain Assessment: Faces Faces Pain Scale: Hurts  little more Pain Location: scrotum  Pain Descriptors / Indicators: Grimacing;Guarding Pain Intervention(s): Repositioned;Limited activity within patient's tolerance;Monitored during session  Home Living                                          Prior Functioning/Environment              Frequency  Min 2X/week        Progress Toward Goals  OT Goals(current goals can now be found in the care plan section)  Progress towards OT goals: Progressing toward goals  ADL Goals Pt Will Perform Grooming: with supervision;standing Pt Will Perform Lower Body Bathing: with  supervision;sit to/from stand;with adaptive equipment Pt Will Perform Lower Body Dressing: with supervision;sit to/from stand;with adaptive equipment Pt Will Transfer to Toilet: with supervision;ambulating;bedside commode Pt Will Perform Toileting - Clothing Manipulation and hygiene: with supervision;sit to/from stand  Plan Discharge plan remains appropriate    Co-evaluation                 End of Session Equipment Utilized During Treatment: Gait belt;Rolling walker   Activity Tolerance Patient tolerated treatment well   Patient Left in bed;with call bell/phone within reach;with bed alarm set;with family/visitor present   Nurse Communication Mobility status        Time: [id number] OT Time Calculation (min): 22 min  Charges:    Jeani HawkingConarpe, Aksh Swart M 03/02/2016, 6:32 PM

## 2016-03-02 NOTE — Clinical Social Work Note (Signed)
Clinical Social Work Assessment  Patient Details  Name: Danny Oneill MRN: [id number] Date of Birth: Jul 16, 1931  Date of referral:  03/02/16               Reason for consult:  Discharge Planning, Facility Placement                Permission sought to share information with:  Facility Sport and exercise psychologist, Family Supports Permission granted to share information::  Yes, Verbal Permission Granted  Name::     [place] Section::  SNFs  Relationship::     Contact Information:     Housing/Transportation Living arrangements for the past 2 months:  [place] of Information:  Adult Children, Patient Patient Interpreter Needed:  None Criminal Activity/Legal Involvement Pertinent to Current Situation/Hospitalization:  No - Comment as needed Significant Relationships:  Adult Children, Spouse Lives with:  Spouse Do you feel safe going back to the place where you live?  Yes Need for family participation in patient care:  Yes (Comment)  Care giving concerns:  The patient's daughter and wife are concerned about the patient returning home at this time as the wife is not capable of taking care of the patient.   Social Worker assessment / plan:  CSW met with the patient and daughter at bedside to complete assessment. The patient was admitted from home. From last admission the patient was discharged to [facility] as private pay as the patient was not approved by insurance. CSW explained to the patient and family that it is very possible that the patient be denied SNF placement by [facility] again. CSW explained SNF search/placement process to the patient and daughter. They prefer [facility]. [facility] states they will offer but only if [facility] approves. CSW has reviewed PT evaluation from today and with the patient ambulating 300+ft its very unlikely the patient will be approved. CSW will continue to follow.  Employment status:  Retired Nurse, adult PT Recommendations:   [place] / Referral to community resources:  [place]  Patient/Family's Response to care:  The patient and daughter appear happy with the care the patient has received though they voice frustration with how the placement process has been in the past. CSW stressed that much of the placement process is out of our hands as the patient's insurance really dictates whether or not the patient is approved.  Patient/Family's Understanding of and Emotional Response to Diagnosis, Current Treatment, and Prognosis:  The patient and family appear to have a good understanding of the patient's reason for admission. The daughter is really hoping for SNF at discharge, but this may be unrealistic given how well he is already doing with PT.  Emotional Assessment Appearance:  Appears stated age Attitude/Demeanor/Rapport:  Other (Patient and daughter appropriate and welcoming of CSW.) Affect (typically observed):  Accepting, Appropriate, Calm, Pleasant Orientation:  Oriented to Self, Oriented to Place, Oriented to  Time, Oriented to Situation Alcohol / Substance use:  Not Applicable Psych involvement (Current and /or in the community):  No (Comment)  Discharge Needs  Concerns to be addressed:  Discharge Planning Concerns Readmission within the last 30 days:  Yes Current discharge risk:  Physical Impairment, Chronically ill Barriers to Discharge:  Continued Medical Work up   Rigoberto Noel, LCSW 03/02/2016, 3:36 PM

## 2016-03-02 NOTE — Care Management Important Message (Signed)
Important Message  Patient Details  Name: Gaven Risko MRN: [id number] Date of Birth: February 16, 1932   Medicare Important Message Given:  Yes    Kyla Balzarine 03/02/2016, 11:47 AM

## 2016-03-03 ENCOUNTER — Ambulatory Visit: Payer: Medicare HMO | Admitting: Cardiovascular Disease

## 2016-03-03 ENCOUNTER — Inpatient Hospital Stay (HOSPITAL_COMMUNITY): Payer: Medicare HMO

## 2016-03-03 DIAGNOSIS — I319 Disease of pericardium, unspecified: Secondary | ICD-10-CM

## 2016-03-03 DIAGNOSIS — E876 Hypokalemia: Secondary | ICD-10-CM

## 2016-03-03 LAB — BASIC METABOLIC PANEL
Anion gap: 9 (ref 5–15)
BUN: 24 mg/dL — ABNORMAL HIGH (ref 6–20)
CALCIUM: 7.8 mg/dL — AB (ref 8.9–10.3)
CO2: 31 mmol/L (ref 22–32)
Chloride: 91 mmol/L — ABNORMAL LOW (ref 101–111)
Creatinine, Ser: 1.06 mg/dL (ref 0.61–1.24)
GLUCOSE: 102 mg/dL — AB (ref 65–99)
POTASSIUM: 3.4 mmol/L — AB (ref 3.5–5.1)
Sodium: 131 mmol/L — ABNORMAL LOW (ref 135–145)

## 2016-03-03 LAB — CBC
HEMATOCRIT: 32.4 % — AB (ref 39.0–52.0)
HEMOGLOBIN: 10.4 g/dL — AB (ref 13.0–17.0)
MCH: 29.7 pg (ref 26.0–34.0)
MCHC: 32.1 g/dL (ref 30.0–36.0)
MCV: 92.6 fL (ref 78.0–100.0)
Platelets: 216 10*3/uL (ref 150–400)
RBC: 3.5 MIL/uL — AB (ref 4.22–5.81)
RDW: 14.4 % (ref 11.5–15.5)
WBC: 6.3 10*3/uL (ref 4.0–10.5)

## 2016-03-03 MED ORDER — POTASSIUM CHLORIDE CRYS ER 20 MEQ PO TBCR
40.0000 meq | EXTENDED_RELEASE_TABLET | Freq: Once | ORAL | Status: AC
  Administered 2016-03-03: 40 meq via ORAL
  Filled 2016-03-03: qty 2

## 2016-03-03 NOTE — Progress Notes (Signed)
  Echocardiogram Echocardiogram Transesophageal has been performed.  Leta Jungling M 03/03/2016, 3:11 PM

## 2016-03-03 NOTE — Progress Notes (Signed)
SUBJECTIVE: Feeling better. No dyspnea. No pain.   Tele: sinus  BP (!) 95/51 (BP Location: Right Arm)   Pulse 61   Temp 97.9 F (36.6 C) (Oral)   Resp 20   Ht 6\' 2"  (1.88 m)   Wt 231 lb 14.4 oz (105.2 kg)   SpO2 97%   BMI 29.77 kg/m   Intake/Output Summary (Last 24 hours) at 03/03/16 0901 Last data filed at 03/03/16 7340  Gross per 24 hour  Intake             2120 ml  Output             1800 ml  Net              320 ml    PHYSICAL EXAM General: Well developed, well nourished, in no acute distress. Alert and oriented x 3.  Psych:  Good affect, responds appropriately Neck: No JVD. No masses noted.  Lungs: Clear bilaterally with no wheezes or rhonci noted.  Heart: RRR with no murmurs noted. Abdomen: Bowel sounds are present. Soft, non-tender.  Extremities: 1+ bilateral lower extremity pitting edema. Still has scrotal edema but improving.   LABS: Basic Metabolic Panel:  Recent Labs  37/09/64 0533 03/03/16 0338  NA 134* 131*  K 3.8 3.4*  CL 94* 91*  CO2 32 31  GLUCOSE 105* 102*  BUN 24* 24*  CREATININE 1.11 1.06  CALCIUM 8.0* 7.8*   CBC:  Recent Labs  03/02/16 0533 03/03/16 0338  WBC 7.5 6.3  HGB 10.7* 10.4*  HCT 33.8* 32.4*  MCV 92.3 92.6  PLT 235 216    Current Meds: . amiodarone  400 mg Oral BID  . aspirin EC  81 mg Oral Q breakfast  . atorvastatin  20 mg Oral q1800  . carvedilol  3.125 mg Oral BID WC  . furosemide  40 mg Intravenous BID  . Melatonin  1 tablet Oral QHS  . pantoprazole  40 mg Oral Daily  . sodium chloride flush  3 mL Intravenous Q12H  . tamsulosin  0.4 mg Oral QPC supper  . ticagrelor  90 mg Oral BID     ASSESSMENT AND PLAN: 82 year old male with history of inferolateral STEMI in late October managed with DES to the OM 2 and complicated by shock with associated cardiomyopathy. He has had recent trouble with bladder obstruction requiring Foley catheter, also substantial weight gain and ultimately diagnosis of a large  pericardial effusion. Admitted with CHF. He has had atrial fibrillation during hospital stay, now in sinus rhythm on amiodarone.  1. Moderate to large pericardial effusion: Echocardiogram on November 20 demonstrated large effusion and f/u echo 02/27/16 demonstrated perhaps mild improvement with diuresis. He is not in any distress to suggest active tamponade or need for pericardiocentesis at this time. Will repeat echo today to reassess (ordered 03/02/16). He may end up needing pericardiocentesis before discharge.   2. Paroxysmal atrial fibrillation: He is maintaining sinus rhythm on oral amiodarone. CHADSVASC score is 5. Not anticoagulated at this time in light of pericardial effusion.   3. CAD/Inferolateral STEMI: He is s/p inferolateral STEMI in October treated with DES to the OM 2. No active angina symptoms. He is on aspirin and Brilinta, beta blocker and statin.   4. Bladder outlet obstruction with foley catheter in place: He also has scrotal edema. He has been evaluated by Urology. Plan is to leave in Foley catheter at discharge with follow-up as an outpatient.  5. Acute on  chronic systolic heart failure: He was transitioned back to IV Lasix 02/29/16. Net negative 10.6 liters since admission. He still has evidence of volume overload with lower ext edema. Will continue IV Lasix today.   6. Hypokalemia: Will replace potassium this am.      Danny Oneill  11/28/20179:01 AM

## 2016-03-04 ENCOUNTER — Inpatient Hospital Stay (HOSPITAL_COMMUNITY): Payer: Medicare HMO

## 2016-03-04 ENCOUNTER — Telehealth: Payer: Self-pay | Admitting: Cardiovascular Disease

## 2016-03-04 ENCOUNTER — Other Ambulatory Visit (HOSPITAL_COMMUNITY): Payer: Medicare HMO

## 2016-03-04 DIAGNOSIS — I319 Disease of pericardium, unspecified: Secondary | ICD-10-CM

## 2016-03-04 LAB — ECHOCARDIOGRAM LIMITED
HEIGHTINCHES: 74 in
Height: 74 in
WEIGHTICAEL: 3606.4 [oz_av]
Weight: 3710.4 oz

## 2016-03-04 LAB — BASIC METABOLIC PANEL
Anion gap: 9 (ref 5–15)
BUN: 24 mg/dL — AB (ref 6–20)
CHLORIDE: 91 mmol/L — AB (ref 101–111)
CO2: 35 mmol/L — ABNORMAL HIGH (ref 22–32)
CREATININE: 1.14 mg/dL (ref 0.61–1.24)
Calcium: 8.1 mg/dL — ABNORMAL LOW (ref 8.9–10.3)
GFR calc Af Amer: >60 mL/min (ref >60)
GFR calc non Af Amer: 58 mL/min — ABNORMAL LOW (ref >60)
Glucose, Bld: 110 mg/dL — ABNORMAL HIGH (ref 65–99)
Potassium: 3.6 mmol/L (ref 3.5–5.1)
SODIUM: 135 mmol/L (ref 135–145)

## 2016-03-04 MED ORDER — HYDROCORTISONE 1 % EX CREA
TOPICAL_CREAM | CUTANEOUS | Status: DC | PRN
  Administered 2016-03-04 – 2016-03-05 (×2): 1 via TOPICAL
  Filled 2016-03-04: qty 28

## 2016-03-04 MED ORDER — DIPHENHYDRAMINE HCL 25 MG PO CAPS
50.0000 mg | ORAL_CAPSULE | Freq: Four times a day (QID) | ORAL | Status: DC | PRN
  Administered 2016-03-04: 50 mg via ORAL
  Filled 2016-03-04: qty 2

## 2016-03-04 MED ORDER — POTASSIUM CHLORIDE CRYS ER 20 MEQ PO TBCR
40.0000 meq | EXTENDED_RELEASE_TABLET | Freq: Once | ORAL | Status: AC
  Administered 2016-03-04: 40 meq via ORAL
  Filled 2016-03-04: qty 2

## 2016-03-04 NOTE — Progress Notes (Signed)
Physical Therapy Treatment Patient Details Name: Danny Oneill MRN: [id number] DOB: 17-Apr-1931 Today's Date: 03/04/2016    History of Present Illness Patient is an 82 y/o male with hx of STEMI, Rt TKA and HLD presents with 30 lb weight gain, abdominal distention, orthopnea and paroxysmal nocturnal dyspnea. Found to have acute on chronic CHF.     PT Comments    Patient reports not getting up and walking yesterday so feeling weaker today. Tolerated gait training 150' with RW for support. Pt continues to demonstrate dyspnea on exertion and decreased endurance. Encouraged ambulation 2 more times today with RN. Pt agreeable. BP soft. Pt still not safe to return home. Will continue to recommend SNF. Will follow.   Follow Up Recommendations  SNF;Supervision/Assistance - 24 hour     Equipment Recommendations  None recommended by PT    Recommendations for Other Services       Precautions / Restrictions Precautions Precautions: Fall Restrictions Weight Bearing Restrictions: No    Mobility  Bed Mobility               General bed mobility comments: Up in chair upon PT arrival.   Transfers Overall transfer level: Needs assistance Equipment used: Rolling walker (2 wheeled) Transfers: Sit to/from Stand Sit to Stand: Min assist         General transfer comment: Assist to power to standing from low chair. No dizziness.  Ambulation/Gait Ambulation/Gait assistance: Min guard Ambulation Distance (Feet): 150 Feet Assistive device: Rolling walker (2 wheeled) Gait Pattern/deviations: Step-through pattern;Decreased stride length;Trunk flexed Gait velocity: decreased   General Gait Details: Cues for forward gaze and upright posture. Right knee instability noted. 1 standing rest break. 2/4 DOE.    Stairs            Wheelchair Mobility    Modified Rankin (Stroke Patients Only)       Balance Overall balance assessment: Needs assistance Sitting-balance support: Feet  supported;No upper extremity supported Sitting balance-Leahy Scale: Good     Standing balance support: During functional activity Standing balance-Leahy Scale: Poor                      Cognition Arousal/Alertness: Awake/alert Behavior During Therapy: WFL for tasks assessed/performed Overall Cognitive Status: Impaired/Different from baseline Area of Impairment: Safety/judgement     Memory: Decreased short-term memory   Safety/Judgement: Decreased awareness of deficits;Decreased awareness of safety   Problem Solving: Slow processing;Requires verbal cues      Exercises      General Comments General comments (skin integrity, edema, etc.): BP 90/70 post ambulation but no symptoms.      Pertinent Vitals/Pain Pain Assessment: No/denies pain    Home Living                      Prior Function            PT Goals (current goals can now be found in the care plan section) Progress towards PT goals: Progressing toward goals    Frequency    Min 3X/week      PT Plan Current plan remains appropriate    Co-evaluation             End of Session Equipment Utilized During Treatment: Gait belt Activity Tolerance: Patient tolerated treatment well Patient left: in chair;with call bell/phone within reach     Time: 1124-1144 PT Time Calculation (min) (ACUTE ONLY): 20 min  Charges:  $Gait Training: 8-22 mins  G Codes:      Mariadelcarmen Corella A Haedyn Breau 03/04/2016, 12:35 PM Mylo RedShauna Kayvion Arneson, PT, DPT [phone]

## 2016-03-04 NOTE — Telephone Encounter (Signed)
New message       Talk to the nurse.  Patient is in the hosp.  Daughter would not tell me what she wanted

## 2016-03-04 NOTE — Progress Notes (Signed)
RN notified by NT of red raised rash on patient's abdomen/sides and back.  Dr.  Clifton Haskel called.  He was already aware of rash.  Order for hydrocortisone obtained.  Will continue to monitor.

## 2016-03-04 NOTE — Progress Notes (Signed)
Occupational Therapy Treatment Patient Details Name: Danny Oneill MRN: [id number] DOB: 08/24/1931 Today's Date: 03/04/2016    History of present illness Patient is an 82 y/o male with hx of STEMI, Rt TKA and HLD presents with 30 lb weight gain, abdominal distention, orthopnea and paroxysmal nocturnal dyspnea. Found to have acute on chronic CHF.    OT comments  Pt limited this session by change in cognitive status and decreased independence with mobility during ambulation requiring mod assist and max verbal cues. Pt able to perform functional mobility into hallway with min assist but then pt running into objects, difficulty following commands, and noted to be dragging R foot; pt assisted into sitting. BP 80/58 initially in sitting, 121/70 after ~3 minutes but asymptomatic throughout. RN and RN tech aware and assisting. Continue to feel pt would be a high fall and safety risk if he were to return home; continue to highly recommend SNF upon d/c. Will continue to follow acutely.   Follow Up Recommendations  SNF;Supervision/Assistance - 24 hour    Equipment Recommendations  3 in 1 bedside comode    Recommendations for Other Services      Precautions / Restrictions Precautions Precautions: Fall Restrictions Weight Bearing Restrictions: No       Mobility Bed Mobility Overal bed mobility: Needs Assistance Bed Mobility: Supine to Sit     Supine to sit: Min guard     General bed mobility comments: Min guard for safety with increased time. Pt able to manage supine to sit without physical assist.  Transfers Overall transfer level: Needs assistance Equipment used: Rolling walker (2 wheeled) Transfers: Sit to/from Stand Sit to Stand: Min assist         General transfer comment: Min assist for sit to stand from EOB with cues for sequencing and technique.    Balance Overall balance assessment: Needs assistance Sitting-balance support: Feet supported;No upper extremity  supported Sitting balance-Leahy Scale: Good     Standing balance support: Bilateral upper extremity supported Standing balance-Leahy Scale: Poor                     ADL Overall ADL's : Needs assistance/impaired                     Lower Body Dressing: Maximal assistance   Toilet Transfer: Moderate assistance;Ambulation;BSC;RW Toilet Transfer Details (indicate cue type and reason): Simulated by sit to stand from EOB with functional mobility.         Functional mobility during ADLs: Moderate assistance;Rolling walker General ADL Comments: During functional mobility, pt with decreased following commands and noted to be dragging R foot (RN and Hydrologist present and assisting). BP 80/58 initially in sitting, after 3 minutes BP 121/70.       Vision                     Perception     Praxis      Cognition   Behavior During Therapy: [facility] for tasks assessed/performed Overall Cognitive Status: Impaired/Different from baseline Area of Impairment: Safety/judgement;Following commands;Problem solving     Memory: Decreased short-term memory  Following Commands: Follows one step commands inconsistently Safety/Judgement: Decreased awareness of deficits;Decreased awareness of safety   Problem Solving: Slow processing;Requires verbal cues      Extremity/Trunk Assessment               Exercises     Shoulder Instructions       General Comments  Pertinent Vitals/ Pain       Pain Assessment: No/denies pain  Home Living                                          Prior Functioning/Environment              Frequency  Min 2X/week        Progress Toward Goals  OT Goals(current goals can now be found in the care plan section)  Progress towards OT goals: Progressing toward goals  Acute Rehab OT Goals Patient Stated Goal: Go home OT Goal Formulation: With patient  Plan Discharge plan remains appropriate     Co-evaluation                 End of Session Equipment Utilized During Treatment: Gait belt;Rolling walker   Activity Tolerance Other (comment) (limited by change in cognition/mobility, low BP)   Patient Left in chair;with call bell/phone within reach;with family/visitor present   Nurse Communication Mobility status;Other (comment) (low BP, change in cognition/mobility)        Time: [id number] OT Time Calculation (min): 20 min  Charges: OT General Charges $OT Visit: 1 Procedure OT Treatments $Self Care/Home Management : 8-22 mins  Gaye AlkenBailey A Maliik Karner M.S., OTR/L Pager: [phone]  03/04/2016, 4:58 PM

## 2016-03-04 NOTE — Progress Notes (Signed)
SUBJECTIVE: No chest pain. Still with dyspnea.   Tele: sinus  BP 117/64   Pulse 61   Temp 98.1 F (36.7 C) (Oral)   Resp 16   Ht 6\' 2"  (1.88 m)   Wt 225 lb 6.4 oz (102.2 kg)   SpO2 98%   BMI 28.94 kg/m   Intake/Output Summary (Last 24 hours) at 03/04/16 [id number] Last data filed at 03/04/16 0500  Gross per 24 hour  Intake             1080 ml  Output             3775 ml  Net            -2695 ml    PHYSICAL EXAM General: Well developed, well nourished, in no acute distress. Alert and oriented x 3.  Psych:  Good affect, responds appropriately Neck: + JVD. No masses noted.  Lungs: Clear bilaterally with no wheezes or rhonci noted.  Heart: RRR with no murmurs noted. Abdomen: Bowel sounds are present. Soft, non-tender.  Extremities: 1+ bilateral lower extremity edema.   LABS: Basic Metabolic Panel:  Recent Labs  11/91/4711/28/17 0338 03/04/16 0501  NA 131* 135  K 3.4* 3.6  CL 91* 91*  CO2 31 35*  GLUCOSE 102* 110*  BUN 24* 24*  CREATININE 1.06 1.14  CALCIUM 7.8* 8.1*   CBC:  Recent Labs  03/02/16 0533 03/03/16 0338  WBC 7.5 6.3  HGB 10.7* 10.4*  HCT 33.8* 32.4*  MCV 92.3 92.6  PLT 235 216   Current Meds: . amiodarone  400 mg Oral BID  . aspirin EC  81 mg Oral Q breakfast  . atorvastatin  20 mg Oral q1800  . carvedilol  3.125 mg Oral BID WC  . furosemide  40 mg Intravenous BID  . Melatonin  1 tablet Oral QHS  . pantoprazole  40 mg Oral Daily  . sodium chloride flush  3 mL Intravenous Q12H  . tamsulosin  0.4 mg Oral QPC supper  . ticagrelor  90 mg Oral BID     ASSESSMENT AND PLAN: 82 year old male with history of inferolateral STEMI in late October managed with DES to the OM 2 and complicated by shock with associated cardiomyopathy. He has had recent trouble with bladder obstruction requiring Foley catheter, also substantial weight gain and ultimately diagnosis of a large pericardial effusion. Admitted with CHF. He has had atrial fibrillation during  hospital stay, now in sinus rhythm on amiodarone.  1.Moderate to large pericardial effusion: Echocardiogram on November 20 demonstrated large effusion and f/u echo 02/27/16 demonstrated perhaps mild improvement with diuresis. He is not in any distress to suggest active tamponade or need for pericardiocentesis at this time. Echo was repeated last night but issues with study quality and cannot be confirmed it was on the right patient. Echo to be repeated today. He may end up needing pericardiocentesis before discharge if there are signs of tamponade.   2. Paroxysmal atrial fibrillation: He is maintaining sinus rhythm on oral amiodarone. CHADSVASC score is 5. Not anticoagulated at this time in light of pericardial effusion.   3.CAD/Inferolateral STEMI: He is s/p inferolateral STEMI in October treated with DES to the OM 2. No active angina symptoms. He is on aspirin and Brilinta, beta blocker and statin.   4. Bladder outlet obstruction with foley catheter in place: He also has scrotal edema. He has been evaluated by Urology. Plan is to leave in Foley catheter at discharge with follow-up as  an outpatient.  5.Acute on chronic systolic heart failure:He was transitioned back to IV Lasix 02/29/16. Net negative 13.1 liters since admission and 2600 cc over the last 24 hours. He still has evidence of volume overload with lower ext edema. Will continue IV Lasix today. Renal function is stable.   6. Hypokalemia: Will replace potassium this am.     Verne Carrow  11/29/20179:17 AM

## 2016-03-04 NOTE — Progress Notes (Signed)
  Echocardiogram 2D Echocardiogram has been performed.  Arvil Chaco 03/04/2016, 3:28 PM

## 2016-03-04 NOTE — Progress Notes (Signed)
  Notified by patient's nurse he has been having a rash along his abdomen and back. The only new medication since admission is Amiodarone. Today is Day 7 on 400mg  BID. Can likely decrease dosing to 200mg  BID starting tomorrow.   Hydrocortisone cream has been ordered along with PO Benadryl. Will continue to monitor.   Signed, Ellsworth Lennox, PA-C 03/04/2016, 1:41 PM Pager: [phone]

## 2016-03-04 NOTE — Telephone Encounter (Signed)
Spoke with daughter she states that pt saw [facility] 02-24-16 and he was sent directly to the hospital. Pt is currently in the hospital and MD's there are recommending that pt go to SNF and [employer] denied. Erie Noe at hospital([phone]) states that Dr Tresa Endo has to do peer-to-peer review.  Spoke with Erie Noe she states that Dr Rosalie Gums has seen pt while in the hospital and has paged him to do this peer-to-peer. Will await call back if anything else is needed

## 2016-03-04 NOTE — Progress Notes (Signed)
Spoke with Dr. Clifton Lyndall about concern for allergic reaction to amiodarone.  He stated to discontinue the order and he will order something else shortly.

## 2016-03-05 LAB — BASIC METABOLIC PANEL
Anion gap: 11 (ref 5–15)
BUN: 28 mg/dL — AB (ref 6–20)
CALCIUM: 8.1 mg/dL — AB (ref 8.9–10.3)
CO2: 30 mmol/L (ref 22–32)
CREATININE: 1.09 mg/dL (ref 0.61–1.24)
Chloride: 92 mmol/L — ABNORMAL LOW (ref 101–111)
GFR calc Af Amer: >60 mL/min (ref >60)
GLUCOSE: 124 mg/dL — AB (ref 65–99)
Potassium: 3.8 mmol/L (ref 3.5–5.1)
SODIUM: 133 mmol/L — AB (ref 135–145)

## 2016-03-05 MED ORDER — FUROSEMIDE 10 MG/ML IJ SOLN
40.0000 mg | Freq: Every day | INTRAMUSCULAR | Status: DC
  Administered 2016-03-06: 40 mg via INTRAVENOUS
  Filled 2016-03-05: qty 4

## 2016-03-05 NOTE — Progress Notes (Signed)
SUBJECTIVE: He feels better today. No dyspnea or chest pain.   Tele: sinus  BP (!) 90/56 (BP Location: Right Arm)   Pulse 60   Temp 97.7 F (36.5 C) (Oral)   Resp 18   Ht 6\' 2"  (1.88 m)   Wt 226 lb 6.4 oz (102.7 kg)   SpO2 98%   BMI 29.07 kg/m   Intake/Output Summary (Last 24 hours) at 03/05/16 0858 Last data filed at 03/05/16 0600  Gross per 24 hour  Intake              875 ml  Output              375 ml  Net              500 ml    PHYSICAL EXAM General: Well developed, well nourished, in no acute distress. Alert and oriented x 3.  Psych:  Good affect, responds appropriately Neck: No JVD. No masses noted.  Lungs: Clear bilaterally with no wheezes or rhonci noted.  Heart: RRR with no murmurs noted. Abdomen: Bowel sounds are present. Soft, non-tender.  Extremities: 1+ bilateral lower extremity edema.  Rash over back and flanks  LABS: Basic Metabolic Panel:  Recent Labs  40/98/1111/29/17 0501 03/05/16 0327  NA 135 133*  K 3.6 3.8  CL 91* 92*  CO2 35* 30  GLUCOSE 110* 124*  BUN 24* 28*  CREATININE 1.14 1.09  CALCIUM 8.1* 8.1*   CBC:  Recent Labs  03/03/16 0338  WBC 6.3  HGB 10.4*  HCT 32.4*  MCV 92.6  PLT 216   Current Meds: . aspirin EC  81 mg Oral Q breakfast  . atorvastatin  20 mg Oral q1800  . carvedilol  3.125 mg Oral BID WC  . furosemide  40 mg Intravenous BID  . Melatonin  1 tablet Oral QHS  . pantoprazole  40 mg Oral Daily  . sodium chloride flush  3 mL Intravenous Q12H  . tamsulosin  0.4 mg Oral QPC supper  . ticagrelor  90 mg Oral BID   Echo 03/04/16: Left ventricle: The cavity size was normal. Wall thickness was   increased in a pattern of mild LVH. Systolic function was normal.   The estimated ejection fraction was in the range of 55% to 60%. - Aortic valve: There was mild to moderate regurgitation. - Pulmonary arteries: Systolic pressure was mildly increased. PA   peak pressure: 35 mm Hg (S). - Pericardium, extracardiac: A small  to moderate, free-flowing   pericardial effusion was identified circumferential to the heart.   There was no evidence of hemodynamic compromise.  ASSESSMENT AND PLAN:82 year old male with history of inferolateral STEMI in late October managed with DES to the OM 2 and complicated by shock with associated cardiomyopathy. He has had recent trouble with bladder obstruction requiring Foley catheter, also substantial weight gain and ultimately diagnosis of a large pericardial effusion. Admitted with CHF. He has had atrial fibrillation during hospital stay, now in sinus rhythm.  1.Pericardial effusion: Echocardiogram on November 20 demonstrated large effusion and f/u echo 02/27/16 demonstrated perhaps mild improvement with diuresis. Repeat echo 03/04/16 with reduction in size of effusion, now small to moderate. No signs of tamponade. He is not in any distress to suggest active tamponade or need for pericardiocentesis at this time.  Will not plan pericardiocentesis at this time.   2. Paroxysmal atrial fibrillation: He is maintaining sinus rhythm on oral beta blocker. Amiodarone was stopped yesterday as it  was felt it could be causing his rash. CHADSVASC score is 5. Not anticoagulated at this time in light of pericardial effusion.   3.CAD/Inferolateral STEMI: He is s/p inferolateral STEMI in October treated with DES to the OM 2. No active angina symptoms. He is on aspirin and Brilinta, beta blocker and statin.   4. Bladder outlet obstruction with foley catheter in place: He also has scrotal edema. He has been evaluated by Urology. Plan is to leave in Foley catheter at discharge with follow-up as an outpatient.  5.Acute on chronic systolic heart failure:He was transitioned back to IV Lasix 02/29/16. Net negative 12.5liters since admission and near even I/O over the last 24 hours. Lasix held last night due to hypotension. Will change Lasix to 40 mg IV daily. Will ask Nursing to help apply compression  stockings.   6. Rash: Felt to be possibly due to amiodarone which is the only new medication that was added. Improving over last 24 hours.    7. Dispo: I spoke with the insurance company doctor yesterday. Pt denied as he was not felt to be ready for d/c. We are asked to resubmit when he is ready for d/c. I do not think he will be ready until Monday.     Tahji   11/30/20178:58 AM

## 2016-03-06 LAB — BASIC METABOLIC PANEL
Anion gap: 9 (ref 5–15)
BUN: 25 mg/dL — AB (ref 6–20)
CHLORIDE: 91 mmol/L — AB (ref 101–111)
CO2: 34 mmol/L — AB (ref 22–32)
Calcium: 8.2 mg/dL — ABNORMAL LOW (ref 8.9–10.3)
Creatinine, Ser: 1.14 mg/dL (ref 0.61–1.24)
GFR calc Af Amer: >60 mL/min (ref >60)
GFR calc non Af Amer: 58 mL/min — ABNORMAL LOW (ref >60)
Glucose, Bld: 102 mg/dL — ABNORMAL HIGH (ref 65–99)
Potassium: 3.6 mmol/L (ref 3.5–5.1)
SODIUM: 134 mmol/L — AB (ref 135–145)

## 2016-03-06 MED ORDER — POTASSIUM CHLORIDE CRYS ER 20 MEQ PO TBCR
40.0000 meq | EXTENDED_RELEASE_TABLET | Freq: Once | ORAL | Status: AC
  Administered 2016-03-06: 40 meq via ORAL
  Filled 2016-03-06: qty 2

## 2016-03-06 NOTE — Progress Notes (Signed)
SUBJECTIVE: Feeling much better. No chest pain. Dyspnea is improved.   Tele: sinus  BP (!) 93/49 (BP Location: Right Arm)   Pulse 64   Temp 97.8 F (36.6 C) (Oral)   Resp 18   Ht 6\' 2"  (1.88 m)   Wt 226 lb 6.4 oz (102.7 kg)   SpO2 94%   BMI 29.07 kg/m   Intake/Output Summary (Last 24 hours) at 03/06/16 0725 Last data filed at 03/06/16 0719  Gross per 24 hour  Intake             1085 ml  Output             1825 ml  Net             -740 ml    PHYSICAL EXAM General: Well developed, well nourished, in no acute distress. Alert and oriented x 3.  Psych:  Good affect, responds appropriately Neck: No JVD. No masses noted.  Lungs: Clear bilaterally with no wheezes or rhonci noted.  Heart: RRR with no murmurs noted. Abdomen: Bowel sounds are present. Soft, non-tender.  Extremities: 1+ bilateral  lower extremity edema.   LABS: Basic Metabolic Panel:  Recent Labs  56/81/27 0327 03/06/16 0319  NA 133* 134*  K 3.8 3.6  CL 92* 91*  CO2 30 34*  GLUCOSE 124* 102*  BUN 28* 25*  CREATININE 1.09 1.14  CALCIUM 8.1* 8.2*    Current Meds: . aspirin EC  81 mg Oral Q breakfast  . atorvastatin  20 mg Oral q1800  . carvedilol  3.125 mg Oral BID WC  . furosemide  40 mg Intravenous Daily  . Melatonin  1 tablet Oral QHS  . pantoprazole  40 mg Oral Daily  . sodium chloride flush  3 mL Intravenous Q12H  . tamsulosin  0.4 mg Oral QPC supper  . ticagrelor  90 mg Oral BID     ASSESSMENT AND PLAN: 82 year old male with history of inferolateral STEMI in late October managed with DES to the OM 2 and complicated by shock with associated cardiomyopathy. He has had recent trouble with bladder obstruction requiring Foley catheter, also substantial weight gain and ultimately diagnosis of a large pericardial effusion. Admitted with acute CHF. He has had atrial fibrillation during hospital stay, now in sinus rhythm.  1.Pericardial effusion: Echocardiogram on November 20 demonstrated  large effusion and f/u echo 02/27/16 demonstrated perhaps mild improvement with diuresis. Repeat echo 03/04/16 with reduction in size of effusion, now small to moderate. No signs of tamponade. He is not in any distress to suggest active tamponade or need for pericardiocentesis at this time.  Will not plan pericardiocentesis at this time.   2. Paroxysmal atrial fibrillation: He is maintaining sinus rhythm on oral beta blocker. Amiodarone was stopped 03/04/16 as it was felt it could be causing his rash and possibly fatigue. CHADSVASC score is 5. Not anticoagulated at this time in light of pericardial effusion.   3.CAD/Inferolateral STEMI: He is s/p inferolateral STEMI in October treated with DES to the OM 2. No active angina symptoms. He is on aspirin and Brilinta, beta blocker and statin.   4. Bladder outlet obstruction with foley catheter in place: He also has scrotal edema. He has been evaluated by Urology during this admission. Plan is to leave in Foley catheter at discharge with follow-up with Urology as an outpatient.  5.Acute on chronic systolic heart failure:He was transitioned back to IV Lasix 02/29/16. Net negative 13.2 liters since admission and  negative 700 cc last 24 hours. He is still volume overloaded. Will continue Lasix 40 mg IV daily and possibly change to po Lasix tomorrow. He has been asked to wear compression stocking for LE edema.    6. Rash: Felt to be possibly due to amiodarone which is the only new medication that was added. Improving over last 48 hours.   7. Hypokalemia: Replace potassium today.    8. Dispo: I spoke with the insurance company doctor 03/04/16. [facility]. Pt denied as he was not felt to be ready for d/c when the insurance company reviewed his records. We are asked to resubmit when he is ready for d/c. I do not think he will be ready until Monday but we should probably go ahead and start planning today. Will need  final recs from OT and PT on chart.     Verne Carrowhristopher Brentlee Delage  12/1/20177:25 AM

## 2016-03-06 NOTE — Care Management Important Message (Signed)
Important Message  Patient Details  Name: Danny Oneill MRN: [id number] Date of Birth: February 05, 1932   Medicare Important Message Given:  Yes    Kyla Balzarine 03/06/2016, 12:31 PM

## 2016-03-07 LAB — BASIC METABOLIC PANEL
Anion gap: 10 (ref 5–15)
BUN: 27 mg/dL — AB (ref 6–20)
CALCIUM: 8.1 mg/dL — AB (ref 8.9–10.3)
CO2: 32 mmol/L (ref 22–32)
CREATININE: 1.28 mg/dL — AB (ref 0.61–1.24)
Chloride: 92 mmol/L — ABNORMAL LOW (ref 101–111)
GFR calc Af Amer: 58 mL/min — ABNORMAL LOW (ref >60)
GFR, EST NON AFRICAN AMERICAN: 50 mL/min — AB (ref >60)
GLUCOSE: 145 mg/dL — AB (ref 65–99)
Potassium: 3.6 mmol/L (ref 3.5–5.1)
Sodium: 134 mmol/L — ABNORMAL LOW (ref 135–145)

## 2016-03-07 LAB — CBC
HCT: 33.5 % — ABNORMAL LOW (ref 39.0–52.0)
Hemoglobin: 10.5 g/dL — ABNORMAL LOW (ref 13.0–17.0)
MCH: 29.2 pg (ref 26.0–34.0)
MCHC: 31.3 g/dL (ref 30.0–36.0)
MCV: 93.1 fL (ref 78.0–100.0)
PLATELETS: 231 10*3/uL (ref 150–400)
RBC: 3.6 MIL/uL — ABNORMAL LOW (ref 4.22–5.81)
RDW: 14.2 % (ref 11.5–15.5)
WBC: 6.4 10*3/uL (ref 4.0–10.5)

## 2016-03-07 MED ORDER — FUROSEMIDE 40 MG PO TABS
40.0000 mg | ORAL_TABLET | Freq: Every day | ORAL | Status: DC
  Administered 2016-03-07 – 2016-03-09 (×3): 40 mg via ORAL
  Filled 2016-03-07 (×3): qty 1

## 2016-03-07 MED ORDER — POTASSIUM CHLORIDE 20 MEQ PO PACK
40.0000 meq | PACK | Freq: Once | ORAL | Status: AC
  Administered 2016-03-07: 40 meq via ORAL
  Filled 2016-03-07: qty 2

## 2016-03-07 MED ORDER — DIGOXIN 0.25 MG/ML IJ SOLN
0.2500 mg | Freq: Once | INTRAMUSCULAR | Status: DC
  Filled 2016-03-07: qty 2

## 2016-03-07 NOTE — Progress Notes (Signed)
Patient Name: Danny Oneill Date of Encounter: 03/07/2016  Primary Cardiologist: [facility] Problem List     Principal Problem:   Acute on chronic systolic heart failure [facility]) Active Problems:   Pericardial effusion   PAF (paroxysmal atrial fibrillation) (HCC)   CAD (coronary artery disease)   Cardiomyopathy, ischemic     Subjective   Says SOB has improved. Felt fatigued in past hour. Denies palpitations/chest pain.  Inpatient Medications    Scheduled Meds: . aspirin EC  81 mg Oral Q breakfast  . atorvastatin  20 mg Oral q1800  . carvedilol  3.125 mg Oral BID WC  . digoxin  0.25 mg Intravenous Once  . furosemide  40 mg Intravenous Daily  . Melatonin  1 tablet Oral QHS  . pantoprazole  40 mg Oral Daily  . sodium chloride flush  3 mL Intravenous Q12H  . tamsulosin  0.4 mg Oral QPC supper  . ticagrelor  90 mg Oral BID   Continuous Infusions:  PRN Meds: sodium chloride, acetaminophen, diphenhydrAMINE, hydrocortisone cream, ondansetron (ZOFRAN) IV, sodium chloride flush   Vital Signs    Vitals:   03/06/16 1900 03/07/16 0051 03/07/16 0500 03/07/16 0803  BP: (!) 101/56 (!) 107/59 (!) 96/54 114/67  Pulse: 60 63 62 62  Resp: 18 18 18 18   Temp: 99.1 F (37.3 C) 97.9 F (36.6 C) 98 F (36.7 C) 97.6 F (36.4 C)  TempSrc: Oral Oral Oral Oral  SpO2: 93% 95% 94% 96%  Weight:   230 lb 4.8 oz (104.5 kg)   Height:        Intake/Output Summary (Last 24 hours) at 03/07/16 0935 Last data filed at 03/07/16 0700  Gross per 24 hour  Intake                0 ml  Output             1450 ml  Net            -1450 ml   Filed Weights   03/04/16 0500 03/05/16 0500 03/07/16 0500  Weight: 225 lb 6.4 oz (102.2 kg) 226 lb 6.4 oz (102.7 kg) 230 lb 4.8 oz (104.5 kg)    Physical Exam    GEN: Well nourished, well developed, in no acute distress.  HEENT: Grossly normal.  Neck: Supple, no JVD, carotid bruits, or masses. Cardiac: Tachycardic, irregular, Nl S1S2, no S3, 2/6  systolic murmur along LSB, no pretibial edema Respiratory:  Crackles at bases. GI: Soft, nontender, nondistended, BS + x 4. MS: no deformity or atrophy. Neuro:  Strength and sensation are intact. Psych: AAOx3.  Normal affect.  Labs    CBC  Recent Labs  03/07/16 0354  WBC 6.4  HGB 10.5*  HCT 33.5*  MCV 93.1  PLT 231   Basic Metabolic Panel  Recent Labs  03/06/16 0319 03/07/16 0354  NA 134* 134*  K 3.6 3.6  CL 91* 92*  CO2 34* 32  GLUCOSE 102* 145*  BUN 25* 27*  CREATININE 1.14 1.28*  CALCIUM 8.2* 8.1*   Liver Function Tests No results for input(s): AST, ALT, ALKPHOS, BILITOT, PROT, ALBUMIN in the last 72 hours. No results for input(s): LIPASE, AMYLASE in the last 72 hours. Cardiac Enzymes No results for input(s): CKTOTAL, CKMB, CKMBINDEX, TROPONINI in the last 72 hours. BNP Invalid input(s): POCBNP D-Dimer No results for input(s): DDIMER in the last 72 hours. Hemoglobin A1C No results for input(s): HGBA1C in the last 72 hours. Fasting Lipid Panel  No results for input(s): CHOL, HDL, LDLCALC, TRIG, CHOLHDL, LDLDIRECT in the last 72 hours. Thyroid Function Tests No results for input(s): TSH, T4TOTAL, T3FREE, THYROIDAB in the last 72 hours.  Invalid input(s): FREET3  Telemetry    Rapid atrial fibrillation - Personally Reviewed  ECG    - Personally Reviewed  Radiology    No results found.  Cardiac Studies     Patient Profile     82 year old male with history of inferolateral STEMI in late October managed with DES to the OM 2 and complicated by shock with associated cardiomyopathy. He has had recent trouble with bladder obstruction requiring Foley catheter, also substantial weight gain and ultimately diagnosis of a large pericardial effusion. Admitted with acute CHF. He has had atrial fibrillation during hospital stay.  Assessment & Plan    1.Pericardial effusion: Echocardiogram on November 20 demonstrated large effusion and f/u echo 02/27/16  demonstrated perhaps mild improvement with diuresis. Repeat echo 03/04/16 with reduction in size of effusion, now small to moderate. No signs of tamponade. He is not in any distress to suggest active tamponade or need for pericardiocentesis at this time. Will not plan pericardiocentesis at this time.   2. Paroxysmal atrial fibrillation: He is back in rapid atrial fibrillation. I will give one dose IV digoxin 0.25 mg. Amiodarone was stopped 03/04/16 as it was felt it could be causing his rash and possibly fatigue. CHADSVASC score is 5. Not anticoagulated at this time in light of pericardial effusion.   3.CAD/Inferolateral STEMI: He is s/p inferolateral STEMI in October treated with DES to the OM 2. No active angina symptoms. He is on aspirin and Brilinta, beta blocker and statin.   4. Bladder outlet obstruction with foley catheter in place: He also has scrotal edema. He has been evaluated by Urology during this admission. Plan is to leave in Foley catheter at discharge with follow-up with Urology as an outpatient.  5.Acute on chronic systolic heart failure:He was transitioned back to IV Lasix 02/29/16. Negative 2 liters in last 24 hours. Will change to po Lasix 40 mg daily. He has been asked to wear compression stocking for LE edema.    6. Rash:Felt to be possibly due to amiodarone which is the only new medication that was added. Improving.  7. Hypokalemia: Low normal. Will given additional supplementation.     8. Dispo: Our team spoke with the insurance company doctor 03/04/16. [facility]. Pt denied as he was not felt to be ready for d/c when the insurance company reviewed his records. We are asked to resubmit when he is ready for d/c. I do not think he will be ready until Monday but we should continue to plan. Will need final recs from OT and PT on chart.  Signed, Prentice DockerSuresh Koneswaran, MD  03/07/2016, 9:35 AM

## 2016-03-08 NOTE — Progress Notes (Addendum)
Patient Name: Danny Oneill Date of Encounter: 03/08/2016  Primary Cardiologist: [facility] Problem List     Principal Problem:   Acute on chronic systolic heart failure [facility]) Active Problems:   Pericardial effusion   PAF (paroxysmal atrial fibrillation) (HCC)   CAD (coronary artery disease)   Cardiomyopathy, ischemic     Subjective   Denies palpitations/chest pain/SOB.  Inpatient Medications    Scheduled Meds: . aspirin EC  81 mg Oral Q breakfast  . atorvastatin  20 mg Oral q1800  . carvedilol  3.125 mg Oral BID WC  . digoxin  0.25 mg Intravenous Once  . furosemide  40 mg Oral Daily  . Melatonin  1 tablet Oral QHS  . pantoprazole  40 mg Oral Daily  . sodium chloride flush  3 mL Intravenous Q12H  . tamsulosin  0.4 mg Oral QPC supper  . ticagrelor  90 mg Oral BID   Continuous Infusions:  PRN Meds: sodium chloride, acetaminophen, diphenhydrAMINE, hydrocortisone cream, ondansetron (ZOFRAN) IV, sodium chloride flush   Vital Signs    Vitals:   03/07/16 2000 03/08/16 0014 03/08/16 0500 03/08/16 0748  BP: 113/60 (!) 102/55 (!) 94/56 136/74  Pulse: 66 63 (!) 59 61  Resp: 20 18 17 16   Temp: 98.1 F (36.7 C) 97.3 F (36.3 C) 98.3 F (36.8 C) 97.8 F (36.6 C)  TempSrc:    Oral  SpO2: 97% 95% 96% 97%  Weight:   228 lb 14.4 oz (103.8 kg)   Height:        Intake/Output Summary (Last 24 hours) at 03/08/16 0904 Last data filed at 03/08/16 0500  Gross per 24 hour  Intake              120 ml  Output             1775 ml  Net            -1655 ml   Filed Weights   03/05/16 0500 03/07/16 0500 03/08/16 0500  Weight: 226 lb 6.4 oz (102.7 kg) 230 lb 4.8 oz (104.5 kg) 228 lb 14.4 oz (103.8 kg)    Physical Exam    GEN: Well nourished, well developed, in no acute distress.  HEENT: Grossly normal.  Neck: Supple, no JVD, carotid bruits, or masses. Cardiac: RRR, , no S3, 2/6 systolic murmur along LSB, trace pretibial edema Respiratory:  Clear GI: Soft, nontender,  nondistended. MS: no deformity or atrophy. Neuro:  Strength and sensation are intact. Psych: AAOx3.  Normal affect. Hard of hearing.   Labs    CBC  Recent Labs  03/07/16 0354  WBC 6.4  HGB 10.5*  HCT 33.5*  MCV 93.1  PLT 231   Basic Metabolic Panel  Recent Labs  03/06/16 0319 03/07/16 0354  NA 134* 134*  K 3.6 3.6  CL 91* 92*  CO2 34* 32  GLUCOSE 102* 145*  BUN 25* 27*  CREATININE 1.14 1.28*  CALCIUM 8.2* 8.1*     Telemetry    NSR with first degree AV block and occasional PVC's. Personally Reviewed  ECG    - Personally Reviewed  Radiology    No results found.  Cardiac Studies   Left ventricle: The cavity size was normal. Wall thickness was   increased in a pattern of mild LVH. Systolic function was normal.   The estimated ejection fraction was in the range of 55% to 60%. - Aortic valve: There was mild to moderate regurgitation. - Pulmonary arteries: Systolic pressure  was mildly increased. PA   peak pressure: 35 mm Hg (S). - Pericardium, extracardiac: A small to moderate, free-flowing   pericardial effusion was identified circumferential to the heart.   There was no evidence of hemodynamic compromise.  Patient Profile     82 year old male with history of inferolateral STEMI in late October managed with DES to the OM 2 and complicated by shock with associated cardiomyopathy. He has had recent trouble with bladder obstruction requiring Foley catheter, also substantial weight gain and ultimately diagnosis of a large pericardial effusion. Admitted with acute CHF. He has had atrial fibrillation during hospital stay.  Assessment & Plan    1.Pericardial effusion: Echocardiogram on November 20 demonstrated large effusion and f/u echo 02/27/16 demonstrated perhaps mild improvement with diuresis. Repeat echo 03/04/16 with reduction in size of effusion, now small to moderate. No signs of tamponade. He is not in any distress to suggest active tamponade or need for  pericardiocentesis at this time. Will not plan pericardiocentesis at this time.   2. Paroxysmal atrial fibrillation: Now in NSR with 1st degree AV block. Given one dose IV digoxin 0.25 mg. Amiodarone was stopped 03/04/16 as it was felt it could be causing his rash and possibly fatigue. CHADSVASC score is 5. Not anticoagulated at this time in light of pericardial effusion.   3.CAD/Inferolateral STEMI: He is s/p inferolateral STEMI in October treated with DES to the OM 2. No active angina symptoms. He is on aspirin and Brilinta, beta blocker and statin.   4. Bladder outlet obstruction with foley catheter in place: He also has scrotal edema. He has been evaluated by Urology during this admission. Plan is to leave in Foley catheter at discharge with follow-up with Urology as an outpatient.  5.Acute on chronic systolic heart failure:He was transitioned back to IV Lasix 02/29/16. Negative 2.6 liters in last 24 hours. Will continue Lasix 40 mg daily. He has been asked to wear compression stocking for LE edema. Trace edema noted. He will wear TED hose.   6. Rash:Felt to be possibly due to amiodarone which is the only new medication that was added and has since been stopped. Improving.  7. Hypokalemia: Low normal yesterday, given supplementation.     8. Dispo: Our team spoke with the insurance company doctor 03/04/16. [facility] was [facility]. Pt denied as he was not felt to be ready for d/c when the insurance company reviewed his records. We are asked to resubmit when he is ready for d/c. I do not think he will be ready until Monday but we should continue to plan. Will need final recs from OT and PT on chart.  Signed, Joni ReiningKathryn Lawrence, NP  03/08/2016, 9:04 AM

## 2016-03-09 ENCOUNTER — Telehealth: Payer: Self-pay | Admitting: Physician Assistant

## 2016-03-09 ENCOUNTER — Encounter (HOSPITAL_COMMUNITY): Payer: Self-pay | Admitting: Physician Assistant

## 2016-03-09 DIAGNOSIS — N183 Chronic kidney disease, stage 3 unspecified: Secondary | ICD-10-CM

## 2016-03-09 DIAGNOSIS — I351 Nonrheumatic aortic (valve) insufficiency: Secondary | ICD-10-CM

## 2016-03-09 DIAGNOSIS — E871 Hypo-osmolality and hyponatremia: Secondary | ICD-10-CM

## 2016-03-09 DIAGNOSIS — I951 Orthostatic hypotension: Secondary | ICD-10-CM

## 2016-03-09 DIAGNOSIS — L270 Generalized skin eruption due to drugs and medicaments taken internally: Secondary | ICD-10-CM

## 2016-03-09 DIAGNOSIS — D649 Anemia, unspecified: Secondary | ICD-10-CM | POA: Diagnosis present

## 2016-03-09 DIAGNOSIS — N320 Bladder-neck obstruction: Secondary | ICD-10-CM

## 2016-03-09 DIAGNOSIS — E876 Hypokalemia: Secondary | ICD-10-CM

## 2016-03-09 DIAGNOSIS — I440 Atrioventricular block, first degree: Secondary | ICD-10-CM

## 2016-03-09 DIAGNOSIS — R339 Retention of urine, unspecified: Secondary | ICD-10-CM

## 2016-03-09 HISTORY — DX: Nonrheumatic aortic (valve) insufficiency: I35.1

## 2016-03-09 HISTORY — DX: Bladder-neck obstruction: N32.0

## 2016-03-09 MED ORDER — TAMSULOSIN HCL 0.4 MG PO CAPS: 0.4000 mg | ORAL_CAPSULE | Freq: Every day | ORAL | 0 refills | Status: DC

## 2016-03-09 MED ORDER — FUROSEMIDE 20 MG PO TABS: 20.0000 mg | ORAL_TABLET | Freq: Every day | ORAL | Status: DC

## 2016-03-09 NOTE — Progress Notes (Signed)
Patient Name: Danny Oneill Date of Encounter: 03/09/2016  [facility]  H[facility] Problem List     Principal Problem:   Acute on chronic systolic heart failure [facility](HCC) Active Problems:   Pericardial effusion   PAF (paroxysmal atrial fibrillation) (HCC)   CAD (coronary artery disease)   Cardiomyopathy, ischemic     Subjective   Denies palpitations/chest pain/SOB.  Inpatient Medications    Scheduled Meds: . aspirin EC  81 mg Oral Q breakfast  . atorvastatin  20 mg Oral q1800  . carvedilol  3.125 mg Oral BID WC  . digoxin  0.25 mg Intravenous Once  . furosemide  40 mg Oral Daily  . Melatonin  1 tablet Oral QHS  . pantoprazole  40 mg Oral Daily  . sodium chloride flush  3 mL Intravenous Q12H  . tamsulosin  0.4 mg Oral QPC supper  . ticagrelor  90 mg Oral BID   Continuous Infusions:  PRN Meds: sodium chloride, acetaminophen, diphenhydrAMINE, hydrocortisone cream, ondansetron (ZOFRAN) IV, sodium chloride flush   Vital Signs    Vitals:   03/08/16 1653 03/08/16 2000 03/09/16 0048 03/09/16 0426  BP: 120/61 (!) 93/52 (!) 96/59 (!) 96/53  Pulse: 64 64 62 61  Resp: 16 18 17 19   Temp: 97.7 F (36.5 C) 98.6 F (37 C) 98.2 F (36.8 C) 98.7 F (37.1 C)  TempSrc: Oral     SpO2: 99% 96% 96% 97%  Weight:    104.2 kg (229 lb 11.2 oz)  Height:        Intake/Output Summary (Last 24 hours) at 03/09/16 0843 Last data filed at 03/09/16 0427  Gross per 24 hour  Intake             1670 ml  Output              450 ml  Net             1220 ml   Filed Weights   03/07/16 0500 03/08/16 0500 03/09/16 0426  Weight: 104.5 kg (230 lb 4.8 oz) 103.8 kg (228 lb 14.4 oz) 104.2 kg (229 lb 11.2 oz)    Physical Exam    GEN: Well nourished, well developed, in no acute distress.  HEENT: Grossly normal.  Neck: Supple, no JVD, carotid bruits, or masses. Cardiac: RRR, , no S3, 2/6 systolic murmur along LSB, trace pretibial edema Respiratory:  Clear GI: Soft, nontender,  nondistended. MS: no deformity or atrophy. Neuro:  Strength and sensation are intact. Psych: AAOx3.  Normal affect. Hard of hearing.   Labs    CBC  Recent Labs  03/07/16 0354  WBC 6.4  HGB 10.5*  HCT 33.5*  MCV 93.1  PLT 231   Basic Metabolic Panel  Recent Labs  03/07/16 0354  NA 134*  K 3.6  CL 92*  CO2 32  GLUCOSE 145*  BUN 27*  CREATININE 1.28*  CALCIUM 8.1*     Telemetry    NSR with first degree AV block and occasional PVC's. Personally Reviewed  ECG    - Personally Reviewed  Radiology    No results found.  Cardiac Studies   Left ventricle: The cavity size was normal. Wall thickness was   increased in a pattern of mild LVH. Systolic function was normal.   The estimated ejection fraction was in the range of 55% to 60%. - Aortic valve: There was mild to moderate regurgitation. - Pulmonary arteries: Systolic pressure was mildly increased. PA   peak pressure: 35  mm Hg (S). - Pericardium, extracardiac: A small to moderate, free-flowing   pericardial effusion was identified circumferential to the heart.   There was no evidence of hemodynamic compromise.  Patient Profile     82 year old male with history of inferolateral STEMI in late October managed with DES to the OM 2 and complicated by shock with associated cardiomyopathy. He has had recent trouble with bladder obstruction requiring Foley catheter, also substantial weight gain and ultimately diagnosis of a large pericardial effusion. Admitted with acute CHF. He has had atrial fibrillation during hospital stay.  Assessment & Plan    1.Pericardial effusion: Echocardiogram on November 20 demonstrated large effusion and f/u echo 02/27/16 demonstrated perhaps mild improvement with diuresis. Repeat echo 03/04/16 with reduction in size of effusion, now small to moderate. No signs of tamponade. He is not in any distress to suggest active tamponade or need for pericardiocentesis at this time. Will not plan  pericardiocentesis at this time.   2. Paroxysmal atrial fibrillation: Now in NSR with 1st degree AV block. Given one dose IV digoxin 0.25 mg. Amiodarone was stopped 03/04/16 as it was felt it could be causing his rash and possibly fatigue. CHADSVASC score is 5. Not anticoagulated at this time in light of pericardial effusion.   3.CAD/Inferolateral STEMI: He is s/p inferolateral STEMI in October treated with DES to the OM 2. No active angina symptoms. He is on aspirin and Brilinta, beta blocker and statin.   4. Bladder outlet obstruction with foley catheter in place: He also has scrotal edema. He has been evaluated by Urology during this admission. Plan is to leave in Foley catheter at discharge with follow-up with Urology as an outpatient.  5.Acute on chronic systolic heart failure:He was transitioned back to IV Lasix 02/29/16. Negative 2.6 liters in last 24 hours. Will continue Lasix 40 mg daily. He has been asked to wear compression stocking for LE edema. Trace edema noted. He will wear TED hose.   6. Rash:Felt to be possibly due to amiodarone which is the only new medication that was added and has since been stopped. Improving.  7. Hypokalemia: Low normal yesterday, given supplementation.     8. Dispo: Daughter and patient indicate not wanting to go back to [name] due to cost. Turned down at [facility] 03/04/16. Will ask PA to look into again see If he can be d/c to [facility] today or some place where financials are better He has an outpatient f/u with Dr Phoebe Perch [facility] for foley   Signed, Charlton Haws, MD  03/09/2016, 8:43 AM Patient ID: Danny Lathe, male   DOB: 04-17-1931, 82 y.o.   MRN: [id number]

## 2016-03-09 NOTE — Care Management Important Message (Signed)
Important Message  Patient Details  Name: Danny Oneill MRN: [id number] Date of Birth: 08-31-1931   Medicare Important Message Given:  Yes    [name] 03/09/2016, 11:26 AM

## 2016-03-09 NOTE — Discharge Instructions (Signed)
Heart Failure °Heart failure is a condition in which the heart has trouble pumping blood because it has become weak or stiff. This means that the heart does not pump blood efficiently for the body to work well. For some people with heart failure, fluid may back up into the lungs and there may be swelling (edema) in the lower legs. Heart failure is usually a long-term (chronic) condition. It is important for you to take good care of yourself and follow the treatment plan from your health care provider. °What are the causes? °This condition is caused by some health problems, including: °· High blood pressure (hypertension). Hypertension causes the heart muscle to work harder than normal. High blood pressure eventually causes the heart to become stiff and weak. °· Coronary artery disease (CAD). CAD is the buildup of cholesterol and fat (plaques) in the arteries of the heart. °· Heart attack (myocardial infarction). Injured tissue, which is caused by the heart attack, does not contract as well and the heart's ability to pump blood is weakened. °· Abnormal heart valves. When the heart valves do not open and close properly, the heart muscle must pump harder to keep the blood flowing. °· Heart muscle disease (cardiomyopathy or myocarditis). Heart muscle disease is damage to the heart muscle from a variety of causes, such as drug or alcohol abuse, infections, or unknown causes. These can increase the risk of heart failure. °· Lung disease. When the lungs do not work properly, the heart must work harder. ° °What increases the risk? °Risk of heart failure increases as a person ages. This condition is also more likely to develop in people who: °· Are overweight. °· Are male. °· Smoke or chew tobacco. °· Abuse alcohol or illegal drugs. °· Have taken medicines that can damage the heart, such as chemotherapy drugs. °· Have diabetes. °? High blood sugar (glucose) is associated with high fat (lipid) levels in the blood. °? Diabetes  can also damage tiny blood vessels that carry nutrients to the heart muscle. °· Have abnormal heart rhythms. °· Have thyroid problems. °· Have low blood counts (anemia). ° °What are the signs or symptoms? °Symptoms of this condition include: °· Shortness of breath with activity, such as when climbing stairs. °· Persistent cough. °· Swelling of the feet, ankles, legs, or abdomen. °· Unexplained weight gain. °· Difficulty breathing when lying flat (orthopnea). °· Waking from sleep because of the need to sit up and get more air. °· Rapid heartbeat. °· Fatigue and loss of energy. °· Feeling light-headed, dizzy, or close to fainting. °· Loss of appetite. °· Nausea. °· Increased urination during the night (nocturia). °· Confusion. ° °How is this diagnosed? °This condition is diagnosed based on: °· Medical history, symptoms, and a physical exam. °· Diagnostic tests, which may include: °? Echocardiogram. °? Electrocardiogram (ECG). °? Chest X-ray. °? Blood tests. °? Exercise stress test. °? Radionuclide scans. °? Cardiac catheterization and angiogram. ° °How is this treated? °Treatment for this condition is aimed at managing the symptoms of heart failure. Medicines, behavioral changes, or other treatments may be necessary to treat heart failure. °Medicines °These may include: °· Angiotensin-converting enzyme (ACE) inhibitors. This type of medicine blocks the effects of a blood protein called angiotensin-converting enzyme. ACE inhibitors relax (dilate) the blood vessels and help to lower blood pressure. °· Angiotensin receptor blockers (ARBs). This type of medicine blocks the actions of a blood protein called angiotensin. ARBs dilate the blood vessels and help to lower blood pressure. °· Water   pills (diuretics). Diuretics cause the kidneys to remove salt and water from the blood. The extra fluid is removed through urination, leaving a lower volume of blood that the heart has to pump. °· Beta blockers. These improve heart  muscle strength and they prevent the heart from beating too quickly. °· Digoxin. This increases the force of the heartbeat. ° °Healthy behavior changes °These may include: °· Reaching and maintaining a healthy weight. °· Stopping smoking or chewing tobacco. °· Eating heart-healthy foods. °· Limiting or avoiding alcohol. °· Stopping use of street drugs (illegal drugs). °· Physical activity. ° °Other treatments °These may include: °· Surgery to open blocked coronary arteries or repair damaged heart valves. °· Placement of a biventricular pacemaker to improve heart muscle function (cardiac resynchronization therapy). This device paces both the right ventricle and left ventricle. °· Placement of a device to treat serious abnormal heart rhythms (implantable cardioverter defibrillator, or ICD). °· Placement of a device to improve the pumping ability of the heart (left ventricular assist device, or LVAD). °· Heart transplant. This can cure heart failure, and it is considered for certain patients who do not improve with other therapies. ° °Follow these instructions at home: °Medicines °· Take over-the-counter and prescription medicines only as told by your health care provider. Medicines are important in reducing the workload of your heart, slowing the progression of heart failure, and improving your symptoms. °? Do not stop taking your medicine unless your health care provider told you to do that. °? Do not skip any dose of medicine. °? Refill your prescriptions before you run out of medicine. You need your medicines every day. °Eating and drinking ° °· Eat heart-healthy foods. Talk with a dietitian to make an eating plan that is right for you. °? Choose foods that contain no trans fat and are low in saturated fat and cholesterol. Healthy choices include fresh or frozen fruits and vegetables, fish, lean meats, legumes, fat-free or low-fat dairy products, and whole-grain or high-fiber foods. °? Limit salt (sodium) if  directed by your health care provider. Sodium restriction may reduce symptoms of heart failure. Ask a dietitian to recommend heart-healthy seasonings. °? Use healthy cooking methods instead of frying. Healthy methods include roasting, grilling, broiling, baking, poaching, steaming, and stir-frying. °· Limit your fluid intake if directed by your health care provider. Fluid restriction may reduce symptoms of heart failure. °Lifestyle °· Stop smoking or using chewing tobacco. Nicotine and tobacco can damage your heart and your blood vessels. Do not use nicotine gum or patches before talking to your health care provider. °· Limit alcohol intake to no more than 1 drink per day for non-pregnant women and 2 drinks per day for men. One drink equals 12 oz of beer, 5 oz of wine, or 1½ oz of hard liquor. °? Drinking more than that is harmful to your heart. Tell your health care provider if you drink alcohol several times a week. °? Talk with your health care provider about whether any level of alcohol use is safe for you. °? If your heart has already been damaged by alcohol or you have severe heart failure, drinking alcohol should be stopped completely. °· Stop use of illegal drugs. °· Lose weight if directed by your health care provider. Weight loss may reduce symptoms of heart failure. °· Do moderate physical activity if directed by your health care provider. People who are elderly and people with severe heart failure should consult with a health care provider for physical activity recommendations. °  Monitor important information °· Weigh yourself every day. Keeping track of your weight daily helps you to notice excess fluid sooner. °? Weigh yourself every morning after you urinate and before you eat breakfast. °? Wear the same amount of clothing each time you weigh yourself. °? Record your daily weight. Provide your health care provider with your weight record. °· Monitor and record your blood pressure as told by your health  care provider. °· Check your pulse as told by your health care provider. °Dealing with extreme temperatures °· If the weather is extremely hot: °? Avoid vigorous physical activity. °? Use air conditioning or fans or seek a cooler location. °? Avoid caffeine and alcohol. °? Wear loose-fitting, lightweight, and light-colored clothing. °· If the weather is extremely cold: °? Avoid vigorous physical activity. °? Layer your clothes. °? Wear mittens or gloves, a hat, and a scarf when you go outside. °? Avoid alcohol. °General instructions °· Manage other health conditions such as hypertension, diabetes, thyroid disease, or abnormal heart rhythms as told by your health care provider. °· Learn to manage stress. If you need help to do this, ask your health care provider. °· Plan rest periods when fatigued. °· Get ongoing education and support as needed. °· Participate in or seek rehabilitation as needed to maintain or improve independence and quality of life. °· Stay up to date with immunizations. Keeping current on pneumococcal and influenza immunizations is especially important to prevent respiratory infections. °· Keep all follow-up visits as told by your health care provider. This is important. °Contact a health care provider if: °· You have a rapid weight gain. °· You have increasing shortness of breath that is unusual for you. °· You are unable to participate in your usual physical activities. °· You tire easily. °· You cough more than normal, especially with physical activity. °· You have any swelling or more swelling in areas such as your hands, feet, ankles, or abdomen. °· You are unable to sleep because it is hard to breathe. °· You feel like your heart is beating quickly (palpitations). °· You become dizzy or light-headed when you stand up. °Get help right away if: °· You have difficulty breathing. °· You notice or your family notices a change in your awareness, such as having trouble staying awake or having  difficulty with concentration. °· You have pain or discomfort in your chest. °· You have an episode of fainting (syncope). °This information is not intended to replace advice given to you by your health care provider. Make sure you discuss any questions you have with your health care provider. °Document Released: 03/23/2005 Document Revised: 11/26/2015 Document Reviewed: 10/16/2015 °Elsevier Interactive Patient Education © 2017 [employer] ° °

## 2016-03-09 NOTE — Progress Notes (Signed)
Physical Therapy Treatment Patient Details Name: Danny Oneill MRN: [id number] DOB: April 07, 1931 Today's Date: 03/09/2016    History of Present Illness Patient is an 82 y/o male with hx of STEMI, Rt TKA and HLD presents with 30 lb weight gain, abdominal distention, orthopnea and paroxysmal nocturnal dyspnea. Found to have acute on chronic CHF.     PT Comments    Patient's mobility and session limited today secondary to orthostatic hypotension. Tolerated exercises in chair and standing however not able to improve BP enough for safe ambulation. Pt with similar episode last week during ambulation resulting in confusion, difficulty problem solving and impaired balance due to soft BP. Sitting BP 126/70 Standing BP 80/45- asymptomatic Sitting BP 98/67 (post UE exercises) Standing BP 71/55 Rn notified of drop in BP with standing. Pt asymptomatic with all transitions. Ambulation deferred due to safety concerns and abnormal BP response. Still requires assist to stand from low surfaces. Not safe to return home. Recommend short term SNF to maximize independence and mobility and to decrease fall risk prior to return home. BP 103/69 post session reclined in chair.   Follow Up Recommendations  SNF;Supervision/Assistance - 24 hour     Equipment Recommendations  None recommended by PT    Recommendations for Other Services       Precautions / Restrictions Precautions Precautions: Fall Precaution Comments: orthostatic  Restrictions Weight Bearing Restrictions: No    Mobility  Bed Mobility               General bed mobility comments: Up in chair upon PT arrival.   Transfers Overall transfer level: Needs assistance Equipment used: Rolling walker (2 wheeled) Transfers: Sit to/from Stand Sit to Stand: Min assist         General transfer comment: Assist to power to standing with cues for sequencing/technique. Stood from Landscape architect.   Ambulation/Gait             General Gait  Details: Deferred secondary to orthostatic hypotension. See assessment for details.   Stairs            Wheelchair Mobility    Modified Rankin (Stroke Patients Only)       Balance Overall balance assessment: Needs assistance Sitting-balance support: Feet supported;No upper extremity supported Sitting balance-Leahy Scale: Good     Standing balance support: During functional activity Standing balance-Leahy Scale: Poor                      Cognition Arousal/Alertness: Awake/alert Behavior During Therapy: WFL for tasks assessed/performed Overall Cognitive Status: Within Functional Limits for tasks assessed                      Exercises General Exercises - Lower Extremity Ankle Circles/Pumps: Both;10 reps;Seated Straight Leg Raises: Both;10 reps;Seated Toe Raises: Both;Seated;20 reps Heel Raises: Both;10 reps;Seated Other Exercises Other Exercises: Punching BUEs x20 to attempt to elevate BP Other Exercises: "Raising the roof" with BUEs to elevate BP.    General Comments General comments (skin integrity, edema, etc.): Wife and daughter present during session.      Pertinent Vitals/Pain Pain Assessment: No/denies pain    Home Living                      Prior Function            PT Goals (current goals can now be found in the care plan section) Progress towards PT goals: Not progressing toward goals -  comment (secondary to orthostatic hypotension.)    Frequency    Min 3X/week      PT Plan Current plan remains appropriate    Co-evaluation             End of Session Equipment Utilized During Treatment: Gait belt Activity Tolerance: Treatment limited secondary to medical complications (Comment) (orthostatic hypotension) Patient left: in chair;with call bell/phone within reach;with family/visitor present     Time: [id number] PT Time Calculation (min) (ACUTE ONLY): 25 min  Charges:  $Therapeutic Exercise: 8-22  mins $Therapeutic Activity: 8-22 mins                    G Codes:      Jennika Ringgold A Vinessa Macconnell 03/09/2016, 10:00 AM Mylo RedShauna Aadon [place]lik, PT, DPT [phone]

## 2016-03-09 NOTE — Clinical Social Work Note (Signed)
Patient and family has opted to take the patient home versus paying privately for SNF while they wait for [facility] authorization. RNCM has assisted with home arrangements. [facility] admissions coordinator Jasmine December has been updated and was informed of family's interest in going to [facility] from home if the patient is approved. CSW signing off at this time.   Roddie Mc MSW, [place], [place], [phone]

## 2016-03-09 NOTE — Discharge Summary (Addendum)
Discharge Summary    Patient ID: Danny Oneill,  MRN: [id number], DOB/AGE: 1931-09-03 82 y.o.  Admit date: 02/24/2016 Discharge date: 03/09/2016  Primary Care Provider: ARONSON,RICHARD A Primary Cardiologist: Dr. Claiborne Billings per pt/family request  Discharge Diagnoses    Principal Problem:   Acute on chronic combined systolic and diastolic CHF (congestive heart failure) (HCC) Active Problems:   Pericardial effusion   Cardiomyopathy, ischemic   Hyperlipidemia   PAF (paroxysmal atrial fibrillation) (HCC)   CAD (coronary artery disease)   Allergic drug rash   Orthostatic hypotension   Hyponatremia   Hypokalemia   Bladder outlet obstruction   Urinary retention   First degree AV block   Aortic insufficiency   CKD (chronic kidney disease), stage III   Anemia    Diagnostic Studies/Procedures    Serial echoes were obtained - the first and last listed below.  1) 02/24/16: Study Conclusions - Left ventricle: The cavity size was normal. Systolic function was   mildly to moderately reduced. The estimated ejection fraction was   in the range of 40% to 45%. Hypokinesis of the inferior and   inferoseptal myocardium. Doppler parameters are consistent with   abnormal left ventricular relaxation (grade 1 diastolic   dysfunction). - Aortic valve: There was mild regurgitation. - Pulmonary arteries: PA peak pressure: 31 mm Hg (S). - Pericardium, extracardiac: A moderate to large, free-flowing   pericardial effusion was identified circumferential to the heart.   The fluid had no internal echoes. There was mildright ventricular   chamber collapse for less than 50% of the cardiac cycle.   Respirophasic change in stroke volume was excessive. Features   were consistent with mild tamponade physiology.  2) 03/04/16: Study Conclusions - Left ventricle: The cavity size was normal. Wall thickness was   increased in a pattern of mild LVH. Systolic function was normal.   The estimated  ejection fraction was in the range of 55% to 60%. - Aortic valve: There was mild to moderate regurgitation. - Pulmonary arteries: Systolic pressure was mildly increased. PA   peak pressure: 35 mm Hg (S). - Pericardium, extracardiac: A small to moderate, free-flowing   pericardial effusion was identified circumferential to the heart.   There was no evidence of hemodynamic compromise. _____________     History of Present Illness     Danny Oneill is a 82 y.o. male with history of arthritis, R knee arthroplasty, asbestosis exposure, and recent inferolateral STEMI/acute systolic CHF/ICM, hypotension, renal insufficiency and urinary retention who presented back to [facility] 02/24/2016 with CHF and pericardial effusion.  Per review of chart he was PREVIOUSLY admitted 02/02/16-02/10/16 with chest pain and malaise, found to have acute inferolateral STEMI. He was found to have occluded OM2 which was treated with DES; otherwise he had moderate 50% oRCA, 40% oLM, 70% D1, 10% mLAD, 80% lateral 2nd marg which was treated medically. He was hypotensive and tachycardiac post procedure. Pressors were avoided due to his tachycardia and options were limited due to his acute renal insufficiency. An intra-aortic balloon pump was inserted for afterload reduction. He had IABP inserted for 2 days and it was discontinued on 02/04/16. 2D Echo showed EF 35-40%. He also developed urinary retention requiring foley placement as well as weakness prompting SNF. After dc his lisinopril was held due to hypotension.   THIS ADMISSION, he presented back to the office on 02/24/16 having gained 30lbs since last visit with severe edema, abdominal distention, orthopnea, and PND. He was initially set up for  outpatient management but 2D echo same day showed moderate-large pericardial effusion with mild tamponade physiology, EF 40-45%, grade 1 DD. He was admitted for management of acute on chronic decompensated HF and pericardial  effusion.  Hospital Course    Several issues were managed during his stay:  - CHF: He was placed on IV Lasix for diuresis. Hyponatremia improved with diuresis. He diuresed from 249lb on admission down to 229lb on day of dc, - 15L net output. His EF was 40-45% on 02/24/16, but f/u echo on 03/04/16 actually showed EF 55-60% with mild-mod AI, PASP 51mHg.   - Pericardial effusion: followed with serial echoes -> by 03/04/16 this was small to moderate. He did not require pericardiocentesis.  - Paroxysmal atrial fib: On 02/26/16 he developed atrial fib RVR but was asymptomatic. He was placed on IV amiodarone and IV heparin and converted to NSR later that day. He was transitioned to oral amiodarone on 02/27/16. Heparin was stopped so as not to contribute to hemorrhagic conversion of his pericardial effusion. He was not started on oral anticoagulation due to this effusion. On 03/04/16 he developed raised red rash felt possibly due to amiodarone, prompting discontinuation of this medication. On 03/07/16 he went back into atrial fib, treated with a dose of IV digoxin with subsequent conversion to NSR. As of 03/08/16 he is maintaining NSR with 1st degree AV block and occasional PVCs.  - Orthostatic hypotension: noted towards the end of hospitalization, with BP drop from 126/70 down to 71/55. He was asymptomatic with this. Per review with Dr. NJohnsie Cancel would recommend to decrease Lasix to 240mdaily starting tomorrow. TED hose were recommended.  - Anemia: appeared relatively stable in 10 range.  - Urologic: He was continued on his recent abx for UTI. He was seen by urology who recommended to keep foley in place and plan outpatient follow-up for trial of voiding. Rapaflo was changed to Flomax with recommendation to continue per urology. He has f/u arranged already with Dr. OtKarsten Rorom his previous admission.  - Renal insufficiency: His Cr remained fairly stable this admission from 1-1.3 with variable fluctuations  within this range, will need to be followed as OP - level suggests CKD III.  Today he is felt to have met maximum benefit from inpatient hospitalization. Physical therapy has been working with him and recommended SNF. However, his insurance would not provide coverage for this. See care management note. The patient's family wishes to take him home. HH RN, PT, OT, CSW and Aide were arranged per care management. Dr. NiJohnsie Cancelas seen and examined the patient today and feels he is stable for discharge. TOC follow-up has been arranged.  Consultants: N/A _____________  Discharge Vitals Blood pressure 103/69, pulse 64, temperature 97.5 F (36.4 C), temperature source Oral, resp. rate 19, height 6' 2" (1.88 m), weight 229 lb 11.2 oz (104.2 kg), SpO2 96 %.  Filed Weights   03/07/16 0500 03/08/16 0500 03/09/16 0426  Weight: 230 lb 4.8 oz (104.5 kg) 228 lb 14.4 oz (103.8 kg) 229 lb 11.2 oz (104.2 kg)    Labs & Radiologic Studies    CBC  Recent Labs  03/07/16 0354  WBC 6.4  HGB 10.5*  HCT 33.5*  MCV 93.1  PLT 23937 Basic Metabolic Panel  Recent Labs  03/07/16 0354  NA 134*  K 3.6  CL 92*  CO2 32  GLUCOSE 145*  BUN 27*  CREATININE 1.28*  CALCIUM 8.1*    Dg Chest 2 View  Result Date:  02/24/2016 CLINICAL DATA:  Shortness of breath, weakness. History of asbestosis. EXAM: CHEST  2 VIEW COMPARISON:  Chest radiograph February 05, 2016 FINDINGS: Cardiac silhouette is moderately enlarged unchanged. Mediastinal silhouette is nonsuspicious, calcified aortic knob. Pulmonary vascular congestion. Strandy densities LEFT lung base, small LEFT pleural effusion. Bilateral calcified pleural plaques. Improved aeration of LEFT lung base. No pneumothorax. Soft tissue planes and included osseous structures are nonsuspicious. IMPRESSION: Stable cardiomegaly and pulmonary vascular congestion. Small LEFT pleural effusion. Calcified pleural plaques consistent with asbestosis. Electronically Signed   By: Elon Alas M.D.   On: 02/24/2016 18:32   Disposition   Pt is being discharged home today in good condition.  Follow-up Plans & Appointments     Contact information for follow-up providers    Almyra Deforest, [place] Follow up.   Specialties:  Cardiology, Radiology Why:  Close follow-up has been arranged with Almyra Deforest PA-C on 03/16/16 at 11am. Please arrive 15 minutes early to check in. Contact information: [address] [place] [address] [phone]        [facility] Follow up.   Why:  Keep follow-up as previously arranged. Contact information: [place] [phone]           Contact information for after-discharge care    Destination    [facility] Follow up.   Specialty:  [place] information: [place] [phone]                 Discharge Instructions    Diet - low sodium heart healthy    Complete by:  As directed    2 gram sodium and 2 liter fluid maximum daily   Increase activity slowly    Complete by:  As directed    Please place TED hose for swelling and orthostatic hypotension.      Discharge Medications     Medication List    STOP taking these medications   amoxicillin 500 MG capsule Commonly known as:  AMOXIL   lisinopril 2.5 MG tablet Commonly known as:  PRINIVIL,ZESTRIL   silodosin 8 MG Caps capsule Commonly known as:  RAPAFLO Replaced by:  tamsulosin 0.4 MG Caps capsule     TAKE these medications   aspirin EC 81 MG tablet Take 81 mg by mouth daily with breakfast.   atorvastatin 20 MG tablet Commonly known as:  LIPITOR Take 20 mg by mouth daily.   carvedilol 3.125 MG tablet Commonly known as:  COREG Take 1 tablet (3.125 mg total) by mouth 2 (two) times daily with a meal.   furosemide 20 MG tablet Commonly known as:  LASIX Take 1 tablet (20 mg total) by mouth daily.   Melatonin  5 MG Tabs Take 1 tablet by mouth at bedtime.   pantoprazole 40 MG tablet Commonly known as:  PROTONIX Take 1 tablet (40 mg total) by mouth daily.   tamsulosin 0.4 MG Caps capsule Commonly known as:  FLOMAX Take 1 capsule (0.4 mg total) by mouth daily after supper. Replaces:  silodosin 8 MG Caps capsule   ticagrelor 90 MG Tabs tablet Commonly known as:  BRILINTA Take 1 tablet (90 mg total) by mouth 2 (two) times daily.        Allergies:  Allergies  Allergen Reactions  . Amiodarone Rash  . Morphine And Related Other (See Comments)    Confusion/ hallucinations      Outstanding Labs/Studies  n/a  Duration of Discharge Encounter   Greater than 30 minutes including physician time.  Signed, Charlie Pitter PA-C 03/09/2016, 12:27 PM

## 2016-03-09 NOTE — Telephone Encounter (Signed)
New message      TCM appt on 03-16-16 with Azalee Course per [facility]

## 2016-03-10 NOTE — Progress Notes (Signed)
Spoke with Roe Coombs at [facility] in [place], there are prescriptions for Lasix and Coreg there they are filling.  Patient's daughter Jahod Radde updated.  Colman Cater

## 2016-03-10 NOTE — Telephone Encounter (Signed)
Patient contacted regarding discharge from [facility] on 03/09/16  Patient understands to follow up with provider Azalee Course on 12/11 at 11:00a at [facility]. Patient understands discharge instructions? Yes Patient understands medications and regimen? Yes Patient understands to bring all medications to this visit? Yes

## 2016-03-12 ENCOUNTER — Telehealth: Payer: Self-pay | Admitting: Cardiovascular Disease

## 2016-03-12 NOTE — Telephone Encounter (Signed)
New message  Pt's wife is calling in reference to medication  protonix 40mg  was given in hospital  Pt wants to know if he is to continue with this medication, if will need Rx  [facility]/[address]/[place]  Please call back and advise

## 2016-03-13 MED ORDER — PANTOPRAZOLE SODIUM 40 MG PO TBEC: 40.0000 mg | DELAYED_RELEASE_TABLET | Freq: Every day | ORAL | 12 refills | Status: DC

## 2016-03-13 NOTE — Telephone Encounter (Signed)
Spoke with pt, Refill sent to the pharmacy electronically.  

## 2016-03-16 ENCOUNTER — Encounter: Payer: Self-pay | Admitting: Physician Assistant

## 2016-03-16 ENCOUNTER — Ambulatory Visit (INDEPENDENT_AMBULATORY_CARE_PROVIDER_SITE_OTHER): Payer: Medicare HMO | Admitting: Physician Assistant

## 2016-03-16 VITALS — BP 105/58 | HR 65 | Ht 74.0 in | Wt 226.0 lb

## 2016-03-16 DIAGNOSIS — R339 Retention of urine, unspecified: Secondary | ICD-10-CM

## 2016-03-16 DIAGNOSIS — I480 Paroxysmal atrial fibrillation: Secondary | ICD-10-CM

## 2016-03-16 DIAGNOSIS — Z79899 Other long term (current) drug therapy: Secondary | ICD-10-CM | POA: Diagnosis not present

## 2016-03-16 DIAGNOSIS — I2510 Atherosclerotic heart disease of native coronary artery without angina pectoris: Secondary | ICD-10-CM | POA: Diagnosis not present

## 2016-03-16 DIAGNOSIS — I3139 Other pericardial effusion (noninflammatory): Secondary | ICD-10-CM

## 2016-03-16 DIAGNOSIS — I313 Pericardial effusion (noninflammatory): Secondary | ICD-10-CM

## 2016-03-16 DIAGNOSIS — I5042 Chronic combined systolic (congestive) and diastolic (congestive) heart failure: Secondary | ICD-10-CM | POA: Diagnosis not present

## 2016-03-16 DIAGNOSIS — I951 Orthostatic hypotension: Secondary | ICD-10-CM

## 2016-03-16 LAB — BASIC METABOLIC PANEL
BUN: 29 mg/dL — ABNORMAL HIGH (ref 7–25)
CALCIUM: 8.3 mg/dL — AB (ref 8.6–10.3)
CO2: 31 mmol/L (ref 20–31)
Chloride: 98 mmol/L (ref 98–110)
Creat: 1.25 mg/dL — ABNORMAL HIGH (ref 0.70–1.11)
Glucose, Bld: 95 mg/dL (ref 65–99)
POTASSIUM: 4.3 mmol/L (ref 3.5–5.3)
SODIUM: 137 mmol/L (ref 135–146)

## 2016-03-16 NOTE — Progress Notes (Signed)
Cardiology Office Note    Date:  03/16/2016   ID:  Danny Oneill, DOB [birth year], MRN [id number]  PCP:  [name], MD  Cardiologist:  Dr. Tresa Endo (per family request)  Chief Complaint  Patient presents with  . Transitions Of Care    seen for Dr. Tresa Endo, 7 day TCM followup    History of Present Illness:  Korbin Mapps is a 82 y.o. male with PMH of right knee arthroplasty and asbestosis exposure was recently admitted to [facility] on 02/02/2016 with inferolateral STEMI. He underwent urgent cardiac catheterization on 02/02/2016 which showed 50% ostial RCA lesion, 40% ostial left main lesion, 70% ostial D1 lesion, 100% ostial OM 2 occlusion treated with 2.25 x 16 mm DES. He was hypotensive in the Cath Lab, balloon pump was placed, however no stressor was initiated unless his BP drop significantly. Echocardiogram obtained on 02/03/2016 which showed EF 35-40%, akinesis of the inferior myocardium, moderate AR. His balloon pump was discontinued on 10/31. Post procedure, his EKG showed persistent ST elevation. According to family history, he actually had symptom several days prior to arrival. Post procedure, he was placed on carvedilol, lisinopril, Lipitor, aspirin and Brilinta. He was evaluated by physical therapy, who recommended skilled nursing facility on discharge. He also has some elevated LFT as well, Lipitor was held pending return of liver function test back to normal.. Hemoglobin was 5.9 which was prediabetes range. During this hospitalization, he did have urinary retention requiring Foley placement. Urology was consulted, and he was discharged with a Foley and with plan to follow-up with urology in a week after discharge. He also had some hematuria likely due to trauma of Foley insertion. Since discharge, he did go back to the emergency room again for urinary retention. Based on nursing home note, his lisinopril was held due to hypotension.   I last saw the patient on 02/24/2016,  at which time he has gained roughly 30 pounds since his previous visit. He also had 3-4+ pedal edema on physical exam. He has an indwelling Foley that was placed by urology. I started him on a higher dose of her diuretic. Due to severe low voltage that was seen on the EKG, there was some concern about pericardial effusion. I ordered a stat echocardiogram that was done on the same day, and showed EF 40-45%, hypokinesis of the inferior and inferoseptal myocardium, grade 1 diastolic dysfunction, PA peak pressure 31 mmHg, mild AR, moderate to large free-flowing pericardial effusion with respiratory phasic changes in stroke volume that was excessive, features consistent with mild tamponade.physiology. He was admitted to the cardiology service on the same day. He was placed on IV diuresis, he did initially have hyponatremia, and this was improved with diuresis. He responded quite well with diuretic, his admission weight was 249 pounds, his discharge weight was 229 pounds with -15 L net output. Repeat echocardiogram obtained on 03/04/2016 she shows EF improved to 55-60% with mild-to-moderate AI, PASP 39 mmHg. Following diuresis, he is pericardial effusion also improved to small to moderate on most recent echo on 11/29. On 11/22/27th 15, he developed atrial fibrillation with RVR, however was asymptomatic. He was placed on IV amiodarone and IV heparin and converted to normal sinus rhythm later that day. Hypertension to oral amiodarone on 11/23. Heparin was stopped as so not to contribute to hemorrhagic conversion of his pericardial effusion. He was not started on oral anticoagulation due to this effusion. On 11/29, he developed a raised red rash felt possibly due to amiodarone  prompting discontinuation. Unfortunately on 03/07/2016, he went back into atrial fibrillation and was treated with IV detox and was subsequent conversion to normal sinus rhythm. He also had significant orthostatic hypotension noted and of the  hospitalization with his blood pressure dropping from 126 systolic down to 71. His Lasix was decreased to 20 mg daily. TED hose was recommended. PT initially recommended skilled nursing facility, however his seizures will not provide coverage for this, his family eventually wished to take him home with home health.  She presents today for cardiology office follow-up. Since discharge, he continued to be short of breath, however stable at his baseline. His weight has not increased any further. He has no cardiac awareness of atrial fibrillation, however today's EKG shows normal sinus rhythm with heart rate 60s. Does have jumped in the heart rate, I would recommend a 30 day event monitor to see recurrence of atrial fib ablation. He is not on any systemic anticoagulation due to concern of hemorrhagic conversion. He likely will need a repeat echocardiogram in about 4-6 weeks to reassess degree of pericardial effusion. His blood pressure remained low, given recent concern for orthostatic hypotension, I would not up titrate his carvedilol at this time. If he does have recurrence of atrial fibrillation, I will consider addition of digoxin as he is allergic to amiodarone.   Past Medical History:  Diagnosis Date  . Anemia   . Aortic insufficiency 03/09/2016   a. mild-mod AI by echo 03/04/16.  . Arthritis   . Asbestosis (HCC)    "no breathing problems" pt states worked in [employer] for many yrs and was exposed to asbestosis  . Bladder outlet obstruction 03/09/2016  . CAD (coronary artery disease)    a. 02/2016: inf lat STEMI with occluded OM2 which was treated with DES; otherwise he had moderate 50% oRCA, 40% oLM, 70% D1, 10% mLAD, 80% lateral 2nd marg which was treated medically.  . Cardiomyopathy, ischemic   . Chronic combined systolic and diastolic CHF (congestive heart failure) (HCC)    a. EF 30-35% at time of STEMI 02/2016, improved to 40-45% by echo 11/20 (but with pericardial effusion). b. Further  improved EF to 55-60% 03/04/16.  Marland Kitchen CKD (chronic kidney disease), stage III   . Coronary artery disease   . Dyspnea   . First degree AV block   . Hyperlipidemia   . Hyponatremia   . Lumbar spondylosis   . Myocardial infarction   . Orthostatic hypotension   . PAF (paroxysmal atrial fibrillation) (HCC)    a. dx 02/2016: not anticoagulated due to pericardial effusion; amiodarone stopped due to suspected allergic drug rash.  . Pericardial effusion    a. 02/2016 - followed clinically (mod-large at first then small-mod in f/u echo).    Past Surgical History:  Procedure Laterality Date  . CARDIAC CATHETERIZATION N/A 02/02/2016   Procedure: Left Heart Cath and Coronary Angiography;  Surgeon: Corky Crafts, MD;  Location: [facility] INVASIVE CV LAB;  Service: Cardiovascular;  Laterality: N/A;  . CARDIAC CATHETERIZATION N/A 02/02/2016   Procedure: Coronary Stent Intervention;  Surgeon: Corky Crafts, MD;  Location: [facility] INVASIVE CV LAB;  Service: Cardiovascular;  Laterality: N/A;  . CARDIAC CATHETERIZATION N/A 02/02/2016   Procedure: IABP Insertion;  Surgeon: Corky Crafts, MD;  Location: [facility] INVASIVE CV LAB;  Service: Cardiovascular;  Laterality: N/A;  . CATARACTS REMOVED     BIL  . ROTATOR CUFF REPAIR  2011   L SHOULDER  . TOTAL KNEE ARTHROPLASTY Right 10/03/2013  Procedure: RIGHT TOTAL KNEE ARTHROPLASTY;  Surgeon: Shelda PalMatthew D Olin, MD;  Location: [facility] ORS;  Service: Orthopedics;  Laterality: Right;    Current Medications: Outpatient Medications Prior to Visit  Medication Sig Dispense Refill  . aspirin EC 81 MG tablet Take 81 mg by mouth daily with breakfast.     . atorvastatin (LIPITOR) 20 MG tablet Take 20 mg by mouth daily.    . carvedilol (COREG) 3.125 MG tablet Take 1 tablet (3.125 mg total) by mouth 2 (two) times daily with a meal. 60 tablet 12  . furosemide (LASIX) 20 MG tablet Take 1 tablet (20 mg total) by mouth daily. 90 tablet 3  . Melatonin 5 MG TABS Take 1 tablet by  mouth at bedtime.    . pantoprazole (PROTONIX) 40 MG tablet Take 1 tablet (40 mg total) by mouth daily. 30 tablet 12  . tamsulosin (FLOMAX) 0.4 MG CAPS capsule Take 1 capsule (0.4 mg total) by mouth daily after supper. 30 capsule 0  . ticagrelor (BRILINTA) 90 MG TABS tablet Take 1 tablet (90 mg total) by mouth 2 (two) times daily. 60 tablet 12   No facility-administered medications prior to visit.      Allergies:   Amiodarone and Morphine and related   Social History   Social History  . Marital status: Married    Spouse name: N/A  . Number of children: N/A  . Years of education: N/A   Social History Main Topics  . Smoking status: Never Smoker  . Smokeless tobacco: Never Used  . Alcohol use No  . Drug use: No  . Sexual activity: Not Asked   Other Topics Concern  . None   Social History Narrative  . None     Family History:  The patient's family history includes Cancer in his mother.   ROS:   Please see the history of present illness.    ROS All other systems reviewed and are negative.   PHYSICAL EXAM:   VS:  BP (!) 105/58   Pulse 65   Ht 6\' 2"  (1.88 m)   Wt 226 lb (102.5 kg)   BMI 29.02 kg/m    GEN: Well nourished, well developed, in no acute distress  HEENT: normal  Neck: no JVD, carotid bruits, or masses Cardiac: RRR; no murmurs, rubs, or gallops. 2+ edema  Respiratory:  clear to auscultation bilaterally, normal work of breathing GI: soft, nontender, + BS +mildly distended MS: no deformity or atrophy  Skin: warm and dry, no rash Neuro:  Alert and Oriented x 3, Strength and sensation are intact Psych: euthymic mood, full affect  Wt Readings from Last 3 Encounters:  03/16/16 226 lb (102.5 kg)  03/09/16 229 lb 11.2 oz (104.2 kg)  02/24/16 253 lb (114.8 kg)      Studies/Labs Reviewed:   EKG:  EKG is ordered today.  The ekg ordered today demonstrates Normal sinus rhythm with T-wave inversion in inferolateral leads, overall low voltage.  Recent  Labs: 02/24/2016: ALT 56; B Natriuretic Peptide 745.7 03/07/2016: Hemoglobin 10.5; Platelets 231 03/16/2016: BUN 29; Creat 1.25; Potassium 4.3; Sodium 137   Lipid Panel    Component Value Date/Time   CHOL 131 02/02/2016 2318   TRIG 61 02/02/2016 2318   HDL 44 02/02/2016 2318   CHOLHDL 3.0 02/02/2016 2318   VLDL 12 02/02/2016 2318   LDLCALC 75 02/02/2016 2318    Additional studies/ records that were reviewed today include:   Echo 02/24/2016 LV EF: 40% -   45%  -  Left ventricle: The cavity size was normal. Systolic function was   mildly to moderately reduced. The estimated ejection fraction was   in the range of 40% to 45%. Hypokinesis of the inferior and   inferoseptal myocardium. Doppler parameters are consistent with   abnormal left ventricular relaxation (grade 1 diastolic   dysfunction). - Aortic valve: There was mild regurgitation. - Pulmonary arteries: PA peak pressure: 31 mm Hg (S). - Pericardium, extracardiac: A moderate to large, free-flowing   pericardial effusion was identified circumferential to the heart.   The fluid had no internal echoes. There was mildright ventricular   chamber collapse for less than 50% of the cardiac cycle.   Respirophasic change in stroke volume was excessive. Features   were consistent with mild tamponade physiology   Echo 02/27/2016  LV EF: 40% -   45%  - Left ventricle: The cavity size was normal. Wall thickness was   increased in a pattern of mild LVH. Systolic function was mildly   to moderately reduced. The estimated ejection fraction was in the   range of 40% to 45%. There is akinesis of the mid-apicalinferior   and inferoseptal myocardium. Features are consistent with a   pseudonormal left ventricular filling pattern, with concomitant   abnormal relaxation and increased filling pressure (grade 2   diastolic dysfunction). - Aortic valve: Mildly to moderately calcified annulus. Trileaflet;   mildly calcified leaflets. There  was mild to moderate   regurgitation. - Mitral valve: Calcified annulus. There was mild regurgitation. - Right ventricle: The cavity size was mildly dilated. - Right atrium: Central venous pressure (est): 15 mm Hg. - Tricuspid valve: There was trivial regurgitation. - Pulmonary arteries: PA peak pressure: 32 mm Hg (S). - Pericardium, extracardiac: Moderate to large, circumferential   pericardial effusion with evidence of organization anteriorly.   Compared to the previous study from 02/24/2016 there has been   some improvement anteriorly. Mitral inflow pattern does not   suggest definitive tamponade physiology. IVC is dilated however.  Impressions:  - Limited study to follow-up pericardial effusion. There is mild   LVH with LVEF 40-45%, mid to apical inferior and inferoseptal   akinesis noted. Grade 2 diastolic dysfunction. Sclerotic valve   with mild to moderate aortic regurgitation. Mild mitral   regurgitation. Mildly dilated right ventricle. Trivial tricuspid   regurgitation with PASP 32 mmHg. Moderate to large,   circumferential pericardial effusion with evidence of   organization anteriorly. Compared to the previous study from   02/24/2016 there has been some improvement anteriorly. Mitral   inflow pattern does not suggest definitive tamponade physiology.   IVC is dilated however.   Echo 03/04/2016 LV EF: 55% -   60%  - Left ventricle: The cavity size was normal. Wall thickness was   increased in a pattern of mild LVH. Systolic function was normal.   The estimated ejection fraction was in the range of 55% to 60%. - Aortic valve: There was mild to moderate regurgitation. - Pulmonary arteries: Systolic pressure was mildly increased. PA   peak pressure: 35 mm Hg (S). - Pericardium, extracardiac: A small to moderate, free-flowing   pericardial effusion was identified circumferential to the heart.   There was no evidence of hemodynamic compromise   ASSESSMENT:    1.  Chronic combined systolic and diastolic heart failure (HCC)   2. Coronary artery disease involving native coronary artery of native heart without angina pectoris   3. Medication management   4. Pericardial effusion   5.  PAF (paroxysmal atrial fibrillation) (HCC)   6. Orthostatic hypotension   7. Urinary retention      PLAN:  In order of problems listed above:  1. Chronic combined systolic and diastolic heart failure: Diuresed roughly 20 pounds recently. Although the initial echo showed ejection fraction about 40-45% with mild abdominal physiology, after diuresis and improvement in the pericardial effusion, repeat echocardiogram obtained on 03/04/2016 actually shows ejection fraction 55-60% instead. He still appears to be volume overloaded notably with 2+ pitting edema in bilateral lower extremity and  Mildly distended abdomen. However his lung is clear on physical exam. He is wearing TED hose for his lower extremity edema. I do not think we can't completely get rid of all the edema. His weight appears to be quite stable since discharge. I will continue him on the current dose of diuretic 20 mg daily.  2. Pericardial effusion: He was recently admitted for moderate to large pericardial effusion concerning for mild And not physiology. After diuresis of -15 L and 20 pounds, repeat echocardiogram shows his pericardial fluid has decreased down to small to moderate size. Consider repeat a limited echocardiogram to assess pericardial fluid on follow-up.  3. CAD: No angina. He is still on aspirin and Brilinta given recent STEMI and a DES to OM 2 in 01/2016.  4. PAF: Newly diagnosed paroxysmal atrial fibrillation in the setting of acute heart failure exacerbation on recent hospitalization. He was not started on systemic anticoagulation due to fear of hemorrhagic conversion of pericardial fluid. I will continue him on the current medication. Will defer to primary cardiologist to decide if he will be a  candidate at later time. I have advised the family to monitor for any heart rate above 100 at rest, if there is symmetric recurrence, I would recommend a 30 day event monitor for assessment of recurrent atrial fibrillation and decision on antiarrhythmic medication. - He is allergic to amiodarone, if he does recurrence of atrial fibrillation, he should be placed on degoxin 0.125 mg daily. - This patients CHA2DS2-VASc Score and unadjusted Ischemic Stroke Rate (% per year) is equal to 4.8 % stroke rate/year from a score of 4  Above score calculated as 1 point each if present [CHF, HTN, DM, Vascular=MI/PAD/Aortic Plaque, Age if 65-74, or Male] Above score calculated as 2 points each if present [Age > 75, or Stroke/TIA/TE]  5. Orthostatic hypotension: Blood pressure borderline today, unable to up titrate blood pressure medication as his blood pressure tended to drop quite a bit upon standing. Right now he does not appears to have significant dizziness, however if he does, we may have to consider midodrine  6. Urinary retention: Noted to have urinary retention after recent inferior STEMI in October and ended up having indwelling Foley going home. His urine appears to be dark and a half foul smell. Note patient did have a positive urine culture showing enterococcus last month. It was sensitive to ampicillin, Levaquin and nitrofurantoin. He is going to his urologist office tomorrow, I will defer to them to obtain clean-catch urine to his test for possible UTI.    Medication Adjustments/Labs and Tests Ordered: Current medicines are reviewed at length with the patient today.  Concerns regarding medicines are outlined above.  Medication changes, Labs and Tests ordered today are listed in the Patient Instructions below. Patient Instructions  NO CHANGES WITH MEDICATIONS  LAB TODAY - BMP   CONTINUE WITH TAKING PROTONIX    INSTRUCTION-- DAILY WEIGHT, BLOOD PRESSURES, HEART RATE CALL IF WEIGHT GAIN  3LBS  OVERNIGHT OR 5 LBS IN A WEEK. IF  RESTING HEART RATE IS EQUAL OR GREATER THAN 100.   SUGGEST TO HAVE URINALYSIS DONE AT UROLOGIST'S OFFICE TOMORROW- DISCOLORED  FOUL ODOR URINE   Your physician recommends that you schedule a follow-up appointment in: 4-6 WEEKS with DR Sunday Corn, PA  03/16/2016 10:56 PM    [facility] [address] [place], [place], [place]  [address] Phone: [phone]; Fax: [phone]

## 2016-03-16 NOTE — Patient Instructions (Addendum)
NO CHANGES WITH MEDICATIONS  LAB TODAY - BMP   CONTINUE WITH TAKING PROTONIX    INSTRUCTION-- DAILY WEIGHT, BLOOD PRESSURES, HEART RATE CALL IF WEIGHT GAIN 3LBS OVERNIGHT OR 5 LBS IN A WEEK. IF  RESTING HEART RATE IS EQUAL OR GREATER THAN 100.   SUGGEST TO HAVE URINALYSIS DONE AT UROLOGIST'S OFFICE TOMORROW- DISCOLORED  FOUL ODOR URINE   Your physician recommends that you schedule a follow-up appointment in: 4-6 WEEKS with DR [facility]

## 2016-03-18 ENCOUNTER — Telehealth: Payer: Self-pay | Admitting: Cardiovascular Disease

## 2016-03-18 NOTE — Telephone Encounter (Signed)
Results note updated  

## 2016-03-18 NOTE — Telephone Encounter (Signed)
Pt returning your call

## 2016-04-01 ENCOUNTER — Other Ambulatory Visit: Payer: Self-pay | Admitting: Physician Assistant

## 2016-04-01 ENCOUNTER — Other Ambulatory Visit: Payer: Self-pay | Admitting: Internal Medicine

## 2016-04-04 ENCOUNTER — Other Ambulatory Visit: Payer: Self-pay | Admitting: Internal Medicine

## 2016-04-07 ENCOUNTER — Telehealth: Payer: Self-pay | Admitting: Cardiovascular Disease

## 2016-04-07 MED ORDER — PANTOPRAZOLE SODIUM 40 MG PO TBEC: 40.0000 mg | DELAYED_RELEASE_TABLET | Freq: Every day | ORAL | 11 refills | Status: DC

## 2016-04-07 MED ORDER — TICAGRELOR 90 MG PO TABS: 90.0000 mg | ORAL_TABLET | Freq: Two times a day (BID) | ORAL | 11 refills | Status: AC

## 2016-04-07 MED ORDER — CARVEDILOL 3.125 MG PO TABS: 3.1250 mg | ORAL_TABLET | Freq: Two times a day (BID) | ORAL | 11 refills | Status: DC

## 2016-04-07 MED ORDER — ATORVASTATIN CALCIUM 20 MG PO TABS: 20.0000 mg | ORAL_TABLET | Freq: Every day | ORAL | 11 refills | Status: AC

## 2016-04-07 MED ORDER — FUROSEMIDE 20 MG PO TABS: 20.0000 mg | ORAL_TABLET | Freq: Every day | ORAL | 11 refills | Status: DC

## 2016-04-07 NOTE — Telephone Encounter (Signed)
Please call,concerning getting pt's medicine.

## 2016-04-07 NOTE — Telephone Encounter (Signed)
meds refilled to preferred pharmacy at caller's request.

## 2016-04-08 ENCOUNTER — Telehealth: Payer: Self-pay | Admitting: Cardiovascular Disease

## 2016-04-08 NOTE — Telephone Encounter (Signed)
New message      Daughter request we call [employer] at [phone] to recertify pt for PT for the new year.  If any questions, please call.

## 2016-04-08 NOTE — Telephone Encounter (Signed)
Patient only seen in the hospital. Will address at 04/28/16 post hospital visit. Please inform daughter.

## 2016-04-08 NOTE — Telephone Encounter (Signed)
LMTCB

## 2016-04-08 NOTE — Telephone Encounter (Signed)
Message routed to Dr. Tresa Endo & Burna Mortimer, CMA to advise on request for PT

## 2016-04-09 NOTE — Telephone Encounter (Signed)
Spoke with daughter yesterday and found out patient needs prior auth from insurance for patient to continue home PT Discussed further with Burna Mortimer CMA needs to be discussed at follow up with Dr Tresa Endo or call PCP to try to get PA Left message to call back

## 2016-04-10 ENCOUNTER — Encounter: Payer: Self-pay | Admitting: Cardiovascular Disease

## 2016-04-10 NOTE — Telephone Encounter (Signed)
This encounter was created in error - please disregard.  This encounter was created in error - please disregard.

## 2016-04-10 NOTE — Telephone Encounter (Signed)
New message      Returning a call to the nurse from [employer]

## 2016-04-10 NOTE — Telephone Encounter (Signed)
Spoke with daughter and advised. She is going to call PCP to see if PA can be done by them

## 2016-04-14 DIAGNOSIS — R3914 Feeling of incomplete bladder emptying: Secondary | ICD-10-CM | POA: Diagnosis not present

## 2016-04-14 DIAGNOSIS — R3912 Poor urinary stream: Secondary | ICD-10-CM | POA: Diagnosis not present

## 2016-04-14 DIAGNOSIS — R338 Other retention of urine: Secondary | ICD-10-CM | POA: Diagnosis not present

## 2016-04-27 ENCOUNTER — Encounter: Payer: Self-pay | Admitting: Podiatry

## 2016-04-27 ENCOUNTER — Ambulatory Visit (INDEPENDENT_AMBULATORY_CARE_PROVIDER_SITE_OTHER): Payer: PPO | Admitting: Podiatry

## 2016-04-27 VITALS — BP 140/74 | HR 67

## 2016-04-27 DIAGNOSIS — L608 Other nail disorders: Secondary | ICD-10-CM

## 2016-04-27 DIAGNOSIS — M79609 Pain in unspecified limb: Secondary | ICD-10-CM

## 2016-04-27 DIAGNOSIS — L603 Nail dystrophy: Secondary | ICD-10-CM

## 2016-04-27 DIAGNOSIS — B351 Tinea unguium: Secondary | ICD-10-CM

## 2016-04-27 NOTE — Addendum Note (Signed)
Addended by: Alyson Ingles on: 04/27/2016 10:55 AM   Modules accepted: Orders

## 2016-04-28 ENCOUNTER — Encounter: Payer: Self-pay | Admitting: Cardiovascular Disease

## 2016-04-28 ENCOUNTER — Ambulatory Visit (INDEPENDENT_AMBULATORY_CARE_PROVIDER_SITE_OTHER): Payer: PPO | Admitting: Cardiovascular Disease

## 2016-04-28 VITALS — BP 122/75 | HR 71 | Ht 74.0 in | Wt 217.6 lb

## 2016-04-28 DIAGNOSIS — I2510 Atherosclerotic heart disease of native coronary artery without angina pectoris: Secondary | ICD-10-CM | POA: Diagnosis not present

## 2016-04-28 DIAGNOSIS — G903 Multi-system degeneration of the autonomic nervous system: Secondary | ICD-10-CM | POA: Diagnosis not present

## 2016-04-28 DIAGNOSIS — E7800 Pure hypercholesterolemia, unspecified: Secondary | ICD-10-CM | POA: Diagnosis not present

## 2016-04-28 DIAGNOSIS — R748 Abnormal levels of other serum enzymes: Secondary | ICD-10-CM

## 2016-04-28 DIAGNOSIS — I480 Paroxysmal atrial fibrillation: Secondary | ICD-10-CM | POA: Diagnosis not present

## 2016-04-28 DIAGNOSIS — I5043 Acute on chronic combined systolic (congestive) and diastolic (congestive) heart failure: Secondary | ICD-10-CM | POA: Diagnosis not present

## 2016-04-28 DIAGNOSIS — R600 Localized edema: Secondary | ICD-10-CM

## 2016-04-28 DIAGNOSIS — I5041 Acute combined systolic (congestive) and diastolic (congestive) heart failure: Secondary | ICD-10-CM

## 2016-04-28 MED ORDER — CARVEDILOL 6.25 MG PO TABS: 6.2500 mg | ORAL_TABLET | Freq: Two times a day (BID) | ORAL | 11 refills | Status: DC

## 2016-04-28 NOTE — Patient Instructions (Addendum)
Your physician has requested that you have an echocardiogram. Echocardiography is a painless test that uses sound waves to create images of your heart. It provides your doctor with information about the size and shape of your heart and how well your heart's chambers and valves are working. This procedure takes approximately one hour. There are no restrictions for this procedure. This will be done at the [address] office.  Your physician recommends that you return for lab work in: 4 weeks fasting.  Your physician has recommended you make the following change in your medication:   1.) alternate the furosemide 40/20 every other day for 3 doses.  2.) in 1 week in crease the carvedilol from 3.125 mg to 6.25 mg twice a day. ( a new prescription has been provided to you today.)   Your physician recommends that you schedule a follow-up appointment in: 6 weeks with Dr Tresa Endo.

## 2016-04-29 NOTE — Progress Notes (Signed)
Cardiology Office Note    Date:  04/29/2016   ID:  Danny Oneill, DOB [birth year], MRN [id number]  PCP:  Geoffery Lyons, MD  Cardiologist:  Shelva Majestic, MD   Chief Complaint  Patient presents with  . Follow-up   Initial office evaluation with me      History of Present Illness:  Danny Oneill is a 83 y.o. male who was admitted to [facility] on 02/02/2016 with inferolateral STEMI.  Urgent cardiac catheterization showed 50% ostial RCA stenosis, 40% ostial left main stenosis, 70% ostial diagonal 1 stenosis, and total occlusion of the OM 2 vessel.  He underwent successful PCI with insertion of a 2.2516 mm DES stent into the circumflex marginal vessel.  This procedure was done by Dr. Irish Lack.  He was hypotensive in the lab.  Initially, a balloon pump was placed.  Except when echo Doppler study in 02/03/2016 showed an EF of 35-40% with akinesis of the inferior wall with moderate AR.  He was treated with Brilinta, aspirin, lisinopril, and atorvastatin.  He developed urinary retention and required transient Foley catheter placement and required urological consultation.  He has been seen office following his hospitalizations by [facility] and was noted have 3-4+ pedal edema.  A subsequent echo Doppler study showed an EF of 40-45% with inferior and inferoseptal hypokinesis, grade 1 diastolic dysfunction, PA pressure 31 mm, mild AR, and a moderate to large free-flowing pericardial effusion.  He was admitted to the hospital.  He was treated with IV diuresis.  A subsequent echo 1 week later showed improvement of LV function and reduction in his pericardial effusion, which was now small to moderate.  On 02/26/2016.  He developed AF with RVR and was started on amiodarone and actually converted to sinus rhythm.  When he last was seen by [facility], he was still complaining of shortness of breath.  Presently, he has felt better.  He is now walking on his own.  He denies any a slight shortness of breath.  He  continues to have tense lower extremity edema.  He presents for evaluation.    Past Medical History:  Diagnosis Date  . Anemia   . Aortic insufficiency 03/09/2016   a. mild-mod AI by echo 03/04/16.  . Arthritis   . Asbestosis ([place])    "no breathing problems" pt states worked in [employer] for many yrs and was exposed to asbestosis  . Bladder outlet obstruction 03/09/2016  . CAD (coronary artery disease)    a. 02/2016: inf lat STEMI with occluded OM2 which was treated with DES; otherwise he had moderate 50% oRCA, 40% oLM, 70% D1, 10% mLAD, 80% lateral 2nd marg which was treated medically.  . Cardiomyopathy, ischemic   . Chronic combined systolic and diastolic CHF (congestive heart failure) (HCC)    a. EF 30-35% at time of STEMI 02/2016, improved to 40-45% by echo 11/20 (but with pericardial effusion). b. Further improved EF to 55-60% 03/04/16.  [place] Kitchen CKD (chronic kidney disease), stage III   . Coronary artery disease   . Dyspnea   . First degree AV block   . Hyperlipidemia   . Hyponatremia   . Lumbar spondylosis   . Myocardial infarction   . Orthostatic hypotension   . PAF (paroxysmal atrial fibrillation) ([place])    a. dx 02/2016: not anticoagulated due to pericardial effusion; amiodarone stopped due to suspected allergic drug rash.  . Pericardial effusion    a. 02/2016 - followed clinically (mod-large at first then small-mod in f/u echo).  Past Surgical History:  Procedure Laterality Date  . CARDIAC CATHETERIZATION N/A 02/02/2016   Procedure: Left Heart Cath and Coronary Angiography;  Surgeon: Jettie Booze, MD;  Location: [facility];  Service: Cardiovascular;  Laterality: N/A;  . CARDIAC CATHETERIZATION N/A 02/02/2016   Procedure: Coronary Stent Intervention;  Surgeon: Jettie Booze, MD;  Location: [facility];  Service: Cardiovascular;  Laterality: N/A;  . CARDIAC CATHETERIZATION N/A 02/02/2016   Procedure: IABP Insertion;  Surgeon: Jettie Booze, MD;  Location: [facility];  Service: Cardiovascular;  Laterality: N/A;  . CATARACTS REMOVED     BIL  . ROTATOR CUFF REPAIR  2011   L SHOULDER  . TOTAL KNEE ARTHROPLASTY Right 10/03/2013   Procedure: RIGHT TOTAL KNEE ARTHROPLASTY;  Surgeon: Mauri Pole, MD;  Location: [facility];  Service: Orthopedics;  Laterality: Right;    Current Medications: Outpatient Medications Prior to Visit  Medication Sig Dispense Refill  . aspirin EC 81 MG tablet Take 81 mg by mouth daily with breakfast.     . atorvastatin (LIPITOR) 20 MG tablet Take 1 tablet (20 mg total) by mouth daily. 30 tablet 11  . furosemide (LASIX) 20 MG tablet Take 1 tablet (20 mg total) by mouth daily. 30 tablet 11  . Melatonin 5 MG TABS Take 1 tablet by mouth at bedtime.    . pantoprazole (PROTONIX) 40 MG tablet Take 1 tablet (40 mg total) by mouth daily. 30 tablet 11  . tamsulosin (FLOMAX) 0.4 MG CAPS capsule TAKE 1 CAPSULE(0.4 MG) BY MOUTH DAILY AFTER SUPPER 90 capsule 3  . ticagrelor (BRILINTA) 90 MG TABS tablet Take 1 tablet (90 mg total) by mouth 2 (two) times daily. 60 tablet 11  . carvedilol (COREG) 3.125 MG tablet Take 1 tablet (3.125 mg total) by mouth 2 (two) times daily with a meal. 60 tablet 11   No facility-administered medications prior to visit.      Allergies:   Amiodarone and Morphine and related   Social History   Social History  . Marital status: Married    Spouse name: N/A  . Number of children: N/A  . Years of education: N/A   Social History Main Topics  . Smoking status: Never Smoker  . Smokeless tobacco: Never Used  . Alcohol use No  . Drug use: No  . Sexual activity: Not Asked   Other Topics Concern  . None   Social History Narrative  . None     Family History:  The patient's family history includes Cancer in his mother.   ROS General: Negative; No fevers, chills, or night sweats;  HEENT: Negative; No changes in vision or hearing, sinus congestion, difficulty  swallowing Pulmonary: Negative; No cough, wheezing, shortness of breath, hemoptysis Cardiovascular: see HPI GI: Negative; No nausea, vomiting, diarrhea, or abdominal pain GU: Negative; No dysuria, hematuria, or difficulty voiding Musculoskeletal: Negative; no myalgias, joint pain, or weakness Hematologic/Oncology: Negative; no easy bruising, bleeding Endocrine: Negative; no heat/cold intolerance; no diabetes Neuro: Negative; no changes in balance, headaches Skin: Negative; No rashes or skin lesions Psychiatric: Negative; No behavioral problems, depression Sleep: Negative; No snoring, daytime sleepiness, hypersomnolence, bruxism, restless legs, hypnogognic hallucinations, no cataplexy Other comprehensive 14 point system review is negative.   PHYSICAL EXAM:   VS:  BP 122/75   Pulse 71   Ht '6\' 2"'  (1.88 m)   Wt 217 lb 9.6 oz (98.7 kg)   BMI 27.94 kg/m     Wt Readings from  Last 3 Encounters:  04/28/16 217 lb 9.6 oz (98.7 kg)  03/16/16 226 lb (102.5 kg)  03/09/16 229 lb 11.2 oz (104.2 kg)    General: Alert, oriented, no distress.  Skin: normal turgor, no rashes, warm and dry HEENT: Normocephalic, atraumatic. Pupils equal round and reactive to light; sclera anicteric; extraocular muscles intact; Fundi discs flat;  Nose without nasal septal hypertrophy Mouth/Parynx benign; Mallinpatti scale 3 Neck: No JVD, no carotid bruits; normal carotid upstroke Lungs: clear to ausculatation and percussion; no wheezing or rales Chest wall: without tenderness to palpitation Heart: PMI not displaced, RRR, s1 s2 normal, 1/6 systolic murmur, no diastolic murmur, no rubs, gallops, thrills, or heaves Abdomen: Small ventral hernia; soft, nontender; no hepatosplenomehaly, BS+; abdominal aorta nontender and not dilated by palpation. Back: no CVA tenderness Pulses 2+ Musculoskeletal: full range of motion, normal strength, no joint deformities Extremities: Tense 1+ bilateral edema, Homan's sign negative   Neurologic: grossly nonfocal; Cranial nerves grossly wnl Psychologic: Normal mood and affect   Studies/Labs Reviewed:  ECG (independently read by me): Normal sinus rhythm at 71 bpm.  T-wave inversion inferolaterally.  Wave in lead 3.  Mild first-degree AV block with a PR interval of 204 ms.  QTc interval 443 ms.  Recent Labs: BMP Latest Ref Rng & Units 03/16/2016 03/07/2016 03/06/2016  Glucose 65 - 99 mg/dL 95 145(H) 102(H)  BUN 7 - 25 mg/dL 29(H) 27(H) 25(H)  Creatinine 0.70 - 1.11 mg/dL 1.25(H) 1.28(H) 1.14  Sodium 135 - 146 mmol/L 137 134(L) 134(L)  Potassium 3.5 - 5.3 mmol/L 4.3 3.6 3.6  Chloride 98 - 110 mmol/L 98 92(L) 91(L)  CO2 20 - 31 mmol/L 31 32 34(H)  Calcium 8.6 - 10.3 mg/dL 8.3(L) 8.1(L) 8.2(L)     Hepatic Function Latest Ref Rng & Units 02/24/2016 02/17/2016 02/07/2016  Total Protein 6.1 - 8.1 g/dL 5.7(L) - 5.5(L)  Albumin 3.6 - 5.1 g/dL 3.0(L) - 2.6(L)  AST 10 - 35 U/L 44(H) 42(A) 70(H)  ALT 9 - 46 U/L 56(H) 59(A) 123(H)  Alk Phosphatase 40 - 115 U/L 50 68 41  Total Bilirubin 0.2 - 1.2 mg/dL 1.0 - 1.0  Bilirubin, Direct 0.1 - 0.5 mg/dL - - 0.3    CBC Latest Ref Rng & Units 03/07/2016 03/03/2016 03/02/2016  WBC 4.0 - 10.5 K/uL 6.4 6.3 7.5  Hemoglobin 13.0 - 17.0 g/dL 10.5(L) 10.4(L) 10.7(L)  Hematocrit 39.0 - 52.0 % 33.5(L) 32.4(L) 33.8(L)  Platelets 150 - 400 K/uL 231 216 235   Lab Results  Component Value Date   MCV 93.1 03/07/2016   MCV 92.6 03/03/2016   MCV 92.3 03/02/2016   No results found for: TSH Lab Results  Component Value Date   HGBA1C 5.9 (H) 02/02/2016     BNP    Component Value Date/Time   BNP 745.7 (H) 02/24/2016 1610   BNP 552.7 (H) 02/24/2016 1409    ProBNP No results found for: PROBNP   Lipid Panel     Component Value Date/Time   CHOL 131 02/02/2016 2318   TRIG 61 02/02/2016 2318   HDL 44 02/02/2016 2318   CHOLHDL 3.0 02/02/2016 2318   VLDL 12 02/02/2016 2318   LDLCALC 75 02/02/2016 2318     RADIOLOGY: No results  found.   Additional studies/ records that were reviewed today include:  I reviewed the patient's hospitalizations, cardiac catheterization reports, echo Doppler studies.    ASSESSMENT:    1. Acute on chronic combined systolic and diastolic CHF (congestive heart failure) ([place])  2. Neurogenic orthostatic hypotension (HCC)   3. Pure hypercholesterolemia   4. Elevated liver enzymes   5. PAF (paroxysmal atrial fibrillation) ([place])   6. Coronary artery disease involving native coronary artery of native heart without angina pectoris   7. Systolic and diastolic CHF, acute ([place])   8. Bilateral lower extremity edema      PLAN:  Sherlynn Stalls feels is an 83 year old Caucasian male who suffered an inferolateral ST segment elevation myocardial infarction on 02/02/2016 and underwent successful stenting of a totally occluded OM 2 vessel.  He has concomitant CAD as noted above.  He initially was hypotensive and required balloon pump insertion.  He has had a complex history.  Subsequently and had developed a pericardial effusion, atrial fibrillation, significant weight gain and lower extremity edema.  His blood pressure today is stable and on repeat by me was 112/70.  He has been taking furosemide 20 mg and has tense lower extremity edema.  I have recommended that he take 40 mg today and alternate 40 with 20 mg every other day for 3 series and if his edema improves, he can then read deuce this back to 20 mg daily.  After 1 week, I am also further titrating his carvedilol to 6.25 mg twice a day particularly with reference to his concomitant CAD, I will not increase this.  Presently, to avoid low blood pressure as we are increasing his diuresis.  He is on atorvastatin for hyperlipidemia.  He continues to be on dual antiplatelet therapy with aspirin and Brilinta and is not having bleeding.  Gadolinium him a follow-up echo Doppler study to reassess LV function and his pericardial effusion.  A complete set of blood work  will be obtained in several weeks.  I will see him in the office in 4-6 weeks for reevaluation.   Medication Adjustments/Labs and Tests Ordered: Current medicines are reviewed at length with the patient today.  Concerns regarding medicines are outlined above.  Medication changes, Labs and Tests ordered today are listed in the Patient Instructions below.  Patient Instructions  Your physician has requested that you have an echocardiogram. Echocardiography is a painless test that uses sound waves to create images of your heart. It provides your doctor with information about the size and shape of your heart and how well your heart's chambers and valves are working. This procedure takes approximately one hour. There are no restrictions for this procedure. This will be done at the [address] office.  Your physician recommends that you return for lab work in: 4 weeks fasting.  Your physician has recommended you make the following change in your medication:   1.) alternate the furosemide 40/20 every other day for 3 doses.  2.) in 1 week in crease the carvedilol from 3.125 mg to 6.25 mg twice a day. ( a new prescription has been provided to you today.)   Your physician recommends that you schedule a follow-up appointment in: 6 weeks with Dr Claiborne Billings.      Time spent: 30 minutes   Signed, Shelva Majestic, MD, [facility] 04/29/2016 7:24 PM    [facility] [address] [place], [place], [place]  [address] Phone: [phone]

## 2016-05-01 NOTE — Addendum Note (Signed)
Addended by: Barrie Dunker on: 05/01/2016 07:44 AM   Modules accepted: Orders

## 2016-05-03 NOTE — Progress Notes (Signed)
   SUBJECTIVE Patient  presents to office today complaining of elongated, thickened nails. Pain while ambulating in shoes. Patient is unable to trim their own nails.   OBJECTIVE General Patient is awake, alert, and oriented x 3 and in no acute distress. Derm Skin is dry and supple bilateral. Negative open lesions or macerations. Remaining integument unremarkable. Nails are tender, long, thickened and dystrophic with subungual debris, consistent with onychomycosis, 1-5 bilateral. No signs of infection noted. Vasc  DP and PT pedal pulses palpable bilaterally. Temperature gradient within normal limits.  Neuro Epicritic and protective threshold sensation diminished bilaterally.  Musculoskeletal Exam No symptomatic pedal deformities noted bilateral. Muscular strength within normal limits.  ASSESSMENT 1. Onychodystrophic nails 1-5 bilateral with hyperkeratosis of nails.  2. Onychomycosis of nail due to dermatophyte bilateral 3. Pain in foot bilateral 4. Hallux abductovalgus with bunions left  PLAN OF CARE 1. Patient evaluated today.  2. Instructed to maintain good pedal hygiene and foot care.  3. Mechanical debridement of nails 1-5 bilaterally performed using a nail nipper. Filed with dremel without incident.  4.  recommend bunion pads over-the-counter  5. Return to clinic in 3 mos.    Felecia Shelling, DPM [facility]  Dr. Felecia Shelling, DPM    [address]                                        [place], [place] [address]                Office [phone]  Fax [phone]

## 2016-05-08 DIAGNOSIS — R3912 Poor urinary stream: Secondary | ICD-10-CM | POA: Diagnosis not present

## 2016-05-08 DIAGNOSIS — R3914 Feeling of incomplete bladder emptying: Secondary | ICD-10-CM | POA: Diagnosis not present

## 2016-05-14 ENCOUNTER — Other Ambulatory Visit: Payer: Self-pay

## 2016-05-14 ENCOUNTER — Ambulatory Visit (HOSPITAL_COMMUNITY): Payer: PPO | Attending: Cardiovascular Disease

## 2016-05-14 DIAGNOSIS — I959 Hypotension, unspecified: Secondary | ICD-10-CM | POA: Insufficient documentation

## 2016-05-14 DIAGNOSIS — E785 Hyperlipidemia, unspecified: Secondary | ICD-10-CM | POA: Insufficient documentation

## 2016-05-14 DIAGNOSIS — D649 Anemia, unspecified: Secondary | ICD-10-CM | POA: Diagnosis not present

## 2016-05-14 DIAGNOSIS — I4891 Unspecified atrial fibrillation: Secondary | ICD-10-CM | POA: Diagnosis not present

## 2016-05-14 DIAGNOSIS — I5043 Acute on chronic combined systolic (congestive) and diastolic (congestive) heart failure: Secondary | ICD-10-CM | POA: Diagnosis not present

## 2016-05-14 DIAGNOSIS — N183 Chronic kidney disease, stage 3 (moderate): Secondary | ICD-10-CM | POA: Insufficient documentation

## 2016-05-14 DIAGNOSIS — I440 Atrioventricular block, first degree: Secondary | ICD-10-CM | POA: Insufficient documentation

## 2016-05-14 DIAGNOSIS — I351 Nonrheumatic aortic (valve) insufficiency: Secondary | ICD-10-CM | POA: Insufficient documentation

## 2016-05-14 DIAGNOSIS — I2510 Atherosclerotic heart disease of native coronary artery without angina pectoris: Secondary | ICD-10-CM | POA: Insufficient documentation

## 2016-05-14 DIAGNOSIS — I371 Nonrheumatic pulmonary valve insufficiency: Secondary | ICD-10-CM | POA: Insufficient documentation

## 2016-05-14 DIAGNOSIS — I340 Nonrheumatic mitral (valve) insufficiency: Secondary | ICD-10-CM | POA: Diagnosis not present

## 2016-05-14 DIAGNOSIS — I255 Ischemic cardiomyopathy: Secondary | ICD-10-CM | POA: Diagnosis not present

## 2016-05-14 DIAGNOSIS — I071 Rheumatic tricuspid insufficiency: Secondary | ICD-10-CM | POA: Insufficient documentation

## 2016-05-14 DIAGNOSIS — I5041 Acute combined systolic (congestive) and diastolic (congestive) heart failure: Secondary | ICD-10-CM

## 2016-05-14 DIAGNOSIS — I219 Acute myocardial infarction, unspecified: Secondary | ICD-10-CM | POA: Diagnosis not present

## 2016-05-14 DIAGNOSIS — I313 Pericardial effusion (noninflammatory): Secondary | ICD-10-CM | POA: Diagnosis not present

## 2016-05-18 DIAGNOSIS — E784 Other hyperlipidemia: Secondary | ICD-10-CM | POA: Diagnosis not present

## 2016-05-18 DIAGNOSIS — G250 Essential tremor: Secondary | ICD-10-CM | POA: Diagnosis not present

## 2016-05-18 DIAGNOSIS — R413 Other amnesia: Secondary | ICD-10-CM | POA: Diagnosis not present

## 2016-05-18 DIAGNOSIS — Z6827 Body mass index (BMI) 27.0-27.9, adult: Secondary | ICD-10-CM | POA: Diagnosis not present

## 2016-05-18 DIAGNOSIS — H9190 Unspecified hearing loss, unspecified ear: Secondary | ICD-10-CM | POA: Diagnosis not present

## 2016-05-18 DIAGNOSIS — I700 Atherosclerosis of aorta: Secondary | ICD-10-CM | POA: Diagnosis not present

## 2016-05-18 DIAGNOSIS — I252 Old myocardial infarction: Secondary | ICD-10-CM | POA: Diagnosis not present

## 2016-05-18 DIAGNOSIS — M1990 Unspecified osteoarthritis, unspecified site: Secondary | ICD-10-CM | POA: Diagnosis not present

## 2016-05-18 DIAGNOSIS — Z0000 Encounter for general adult medical examination without abnormal findings: Secondary | ICD-10-CM | POA: Diagnosis not present

## 2016-05-18 DIAGNOSIS — Z1389 Encounter for screening for other disorder: Secondary | ICD-10-CM | POA: Diagnosis not present

## 2016-05-18 DIAGNOSIS — I5021 Acute systolic (congestive) heart failure: Secondary | ICD-10-CM | POA: Diagnosis not present

## 2016-05-18 DIAGNOSIS — M4696 Unspecified inflammatory spondylopathy, lumbar region: Secondary | ICD-10-CM | POA: Diagnosis not present

## 2016-05-26 ENCOUNTER — Encounter (HOSPITAL_COMMUNITY): Payer: Self-pay | Admitting: Internal Medicine

## 2016-05-26 NOTE — Progress Notes (Signed)
Mailed letter with [facility] to pt ... KJ  °

## 2016-06-09 ENCOUNTER — Ambulatory Visit: Payer: PPO | Admitting: Cardiovascular Disease

## 2016-06-10 ENCOUNTER — Ambulatory Visit (INDEPENDENT_AMBULATORY_CARE_PROVIDER_SITE_OTHER): Payer: PPO | Admitting: Cardiovascular Disease

## 2016-06-10 ENCOUNTER — Encounter: Payer: Self-pay | Admitting: Cardiovascular Disease

## 2016-06-10 VITALS — BP 96/58 | HR 67 | Ht 74.0 in | Wt 219.0 lb

## 2016-06-10 DIAGNOSIS — R748 Abnormal levels of other serum enzymes: Secondary | ICD-10-CM

## 2016-06-10 DIAGNOSIS — Z79899 Other long term (current) drug therapy: Secondary | ICD-10-CM | POA: Diagnosis not present

## 2016-06-10 DIAGNOSIS — I5042 Chronic combined systolic (congestive) and diastolic (congestive) heart failure: Secondary | ICD-10-CM

## 2016-06-10 DIAGNOSIS — E785 Hyperlipidemia, unspecified: Secondary | ICD-10-CM

## 2016-06-10 DIAGNOSIS — R600 Localized edema: Secondary | ICD-10-CM | POA: Diagnosis not present

## 2016-06-10 DIAGNOSIS — I480 Paroxysmal atrial fibrillation: Secondary | ICD-10-CM

## 2016-06-10 DIAGNOSIS — I2510 Atherosclerotic heart disease of native coronary artery without angina pectoris: Secondary | ICD-10-CM

## 2016-06-10 MED ORDER — FUROSEMIDE 40 MG PO TABS: ORAL_TABLET | ORAL | 6 refills | Status: DC

## 2016-06-10 NOTE — Patient Instructions (Signed)
Your physician has recommended you make the following change in your medication:   1.)  The furosemide has been changed to 40 mg( 1 tablet) on even days of the month and 20 mg (1/2 tablet) on odd days of the month.  Your physician recommends that you schedule a follow-up appointment and blood work in 3 months. Lab slips have been provided to you today.

## 2016-06-11 NOTE — Progress Notes (Signed)
Cardiology Office Note    Date:  06/11/2016   ID:  Danny Oneill, DOB 27-Jul-1931, MRN [id number]  PCP:  Danny Lyons, MD  Cardiologist:  Shelva Majestic, MD   Chief Complaint  Patient presents with  . Follow-up   2 month follow-up office evaluation with me      History of Present Illness:  Danny Oneill is a 83 y.o. male who was admitted to [name] hospital on 02/02/2016 with inferolateral STEMI.  Urgent cardiac catheterization showed 50% ostial RCA stenosis, 40% ostial left main stenosis, 70% ostial diagonal 1 stenosis, and total occlusion of the OM 2 vessel.  He underwent successful PCI with insertion of a 2.2516 mm DES stent into the circumflex marginal vessel.  This procedure was done by Dr. Irish Lack.  He was hypotensive in the lab.  Initially, a balloon pump was placed.  Except when echo Doppler study in 02/03/2016 showed an EF of 35-40% with akinesis of the inferior wall with moderate AR.  He was treated with Brilinta, aspirin, lisinopril, and atorvastatin.  He developed urinary retention and required transient Foley catheter placement and required urological consultation.  He has been seen office following his hospitalizations by [facility] and was noted have 3-4+ pedal edema.  A subsequent echo Doppler study showed an EF of 40-45% with inferior and inferoseptal hypokinesis, grade 1 diastolic dysfunction, PA pressure 31 mm, mild AR, and a moderate to large free-flowing pericardial effusion.  He was admitted to the hospital.  He was treated with IV diuresis.  A subsequent echo 1 week later showed improvement of LV function and reduction in his pericardial effusion, which was now small to moderate.  On 02/26/2016.  He developed AF with RVR and was started on amiodarone and actually converted to sinus rhythm.  When he last was seen by [facility], he was still complaining of shortness of breath.  When I saw him for initial office follow-up evaluation in January 2018 he was feeling significantly  improved. He he denied any chest pain or significant shortness of breath.  However, he continued to have tense lower extremity edema.  I recommended increasing his furosemide to 40 mg for several days and then alternating every other day with 20 mg.  I also further titrated carvedilol.  He underwent a follow-up echo Doppler study to reassess LV function and his previous pericardial effusion which was done on 05/14/2016.  Ejection fraction was 45-50%.  There was grade 1 diastolic dysfunction.  There was moderate pulmonary hypertension with a PA pressure at 51 mm.  There was mild aortic insufficiency.  Is a descending aorta was mildly dilated.  There was mild MR.  He There was no mention of any pericardial effusion.  He tells me.  He saw a urologist one month ago and was started on Flomax.  He denies any chest pain.  He denies PND or orthopnea.  He presents for evaluation  Past Medical History:  Diagnosis Date  . Anemia   . Aortic insufficiency 03/09/2016   a. mild-mod AI by echo 03/04/16.  . Arthritis   . Asbestosis ([place])    "no breathing problems" pt states worked in [employer] for many yrs and was exposed to asbestosis  . Bladder outlet obstruction 03/09/2016  . CAD (coronary artery disease)    a. 02/2016: inf lat STEMI with occluded OM2 which was treated with DES; otherwise he had moderate 50% oRCA, 40% oLM, 70% D1, 10% mLAD, 80% lateral 2nd marg which was treated medically.  Marland Kitchen  Cardiomyopathy, ischemic   . Chronic combined systolic and diastolic CHF (congestive heart failure) (HCC)    a. EF 30-35% at time of STEMI 02/2016, improved to 40-45% by echo 11/20 (but with pericardial effusion). b. Further improved EF to 55-60% 03/04/16.  Marland Kitchen CKD (chronic kidney disease), stage III   . Coronary artery disease   . Dyspnea   . First degree AV block   . Hyperlipidemia   . Hyponatremia   . Lumbar spondylosis   . Myocardial infarction   . Orthostatic hypotension   . PAF (paroxysmal atrial  fibrillation) ([place])    a. dx 02/2016: not anticoagulated due to pericardial effusion; amiodarone stopped due to suspected allergic drug rash.  . Pericardial effusion    a. 02/2016 - followed clinically (mod-large at first then small-mod in f/u echo).    Past Surgical History:  Procedure Laterality Date  . CARDIAC CATHETERIZATION N/A 02/02/2016   Procedure: Left Heart Cath and Coronary Angiography;  Surgeon: Jettie Booze, MD;  Location: [place] [facility];  Service: Cardiovascular;  Laterality: N/A;  . CARDIAC CATHETERIZATION N/A 02/02/2016   Procedure: Coronary Stent Intervention;  Surgeon: Jettie Booze, MD;  Location: [place] [facility];  Service: Cardiovascular;  Laterality: N/A;  . CARDIAC CATHETERIZATION N/A 02/02/2016   Procedure: IABP Insertion;  Surgeon: Jettie Booze, MD;  Location: [place] [facility];  Service: Cardiovascular;  Laterality: N/A;  . CATARACTS REMOVED     BIL  . ROTATOR CUFF REPAIR  2011   L SHOULDER  . TOTAL KNEE ARTHROPLASTY Right 10/03/2013   Procedure: RIGHT TOTAL KNEE ARTHROPLASTY;  Surgeon: Mauri Pole, MD;  Location: [facility];  Service: Orthopedics;  Laterality: Right;    Current Medications: Outpatient Medications Prior to Visit  Medication Sig Dispense Refill  . aspirin EC 81 MG tablet Take 81 mg by mouth daily with breakfast.     . atorvastatin (LIPITOR) 20 MG tablet Take 1 tablet (20 mg total) by mouth daily. 30 tablet 11  . carvedilol (COREG) 6.25 MG tablet Take 1 tablet (6.25 mg total) by mouth 2 (two) times daily. 60 tablet 11  . pantoprazole (PROTONIX) 40 MG tablet Take 1 tablet (40 mg total) by mouth daily. 30 tablet 11  . tamsulosin (FLOMAX) 0.4 MG CAPS capsule TAKE 1 CAPSULE(0.4 MG) BY MOUTH DAILY AFTER SUPPER 90 capsule 3  . ticagrelor (BRILINTA) 90 MG TABS tablet Take 1 tablet (90 mg total) by mouth 2 (two) times daily. 60 tablet 11  . furosemide (LASIX) 20 MG tablet Take 1 tablet (20 mg total) by mouth daily. 30 tablet 11    . Melatonin 5 MG TABS Take 1 tablet by mouth at bedtime.     No facility-administered medications prior to visit.      Allergies:   Amiodarone and Morphine and related   Social History   Social History  . Marital status: Married    Spouse name: N/A  . Number of children: N/A  . Years of education: N/A   Social History Main Topics  . Smoking status: Never Smoker  . Smokeless tobacco: Never Used  . Alcohol use No  . Drug use: No  . Sexual activity: Not Asked   Other Topics Concern  . None   Social History Narrative  . None     Family History:  The patient's family history includes Cancer in his mother.   ROS General: Negative; No fevers, chills, or night sweats;  HEENT: Negative; No changes in vision  or hearing, sinus congestion, difficulty swallowing Pulmonary: Negative; No cough, wheezing, shortness of breath, hemoptysis Cardiovascular: see HPI GI: Negative; No nausea, vomiting, diarrhea, or abdominal pain GU: Negative; No dysuria, hematuria, or difficulty voiding Musculoskeletal: Negative; no myalgias, joint pain, or weakness Hematologic/Oncology: Negative; no easy bruising, bleeding Endocrine: Negative; no heat/cold intolerance; no diabetes Neuro: Negative; no changes in balance, headaches Skin: Negative; No rashes or skin lesions Psychiatric: Negative; No behavioral problems, depression Sleep: Negative; No snoring, daytime sleepiness, hypersomnolence, bruxism, restless legs, hypnogognic hallucinations, no cataplexy Other comprehensive 14 point system review is negative.   PHYSICAL EXAM:   VS:  BP (!) 96/58   Pulse 67   Ht '6\' 2"'  (1.88 m)   Wt 219 lb (99.3 kg)   BMI 28.12 kg/m     Wt Readings from Last 3 Encounters:  06/10/16 219 lb (99.3 kg)  04/28/16 217 lb 9.6 oz (98.7 kg)  03/16/16 226 lb (102.5 kg)    General: Alert, oriented, no distress.  Skin: normal turgor, no rashes, warm and dry HEENT: Normocephalic, atraumatic. Pupils equal round and  reactive to light; sclera anicteric; extraocular muscles intact; Fundi discs flat;  Nose without nasal septal hypertrophy Mouth/Parynx benign; Mallinpatti scale 3 Neck: No JVD, no carotid bruits; normal carotid upstroke Lungs: clear to ausculatation and percussion; no wheezing or rales Chest wall: without tenderness to palpitation Heart: PMI not displaced, RRR, s1 s2 normal, 1/6 systolic murmur, no diastolic murmur, no rubs, gallops, thrills, or heaves Abdomen: Small ventral hernia; soft, nontender; no hepatosplenomehaly, BS+; abdominal aorta nontender and not dilated by palpation. Back: no CVA tenderness Pulses 2+ Musculoskeletal: full range of motion, normal strength, no joint deformities Extremities: Tense 1+ bilateral edema, Homan's sign negative  Neurologic: grossly nonfocal; Cranial nerves grossly wnl Psychologic: Normal mood and affect   Studies/Labs Reviewed:   ECG (independently read by me): Sinus rhythm at 67 bpm with first-degree AV block with a.  0210 ms.  ST changes anterolaterally.  January 2018 ECG (independently read by me): Normal sinus rhythm at 71 bpm.  T-wave inversion inferolaterally.  Wave in lead 3.  Mild first-degree AV block with a PR interval of 204 ms.  QTc interval 443 ms.  Recent Labs: BMP Latest Ref Rng & Units 03/16/2016 03/07/2016 03/06/2016  Glucose 65 - 99 mg/dL 95 145(H) 102(H)  BUN 7 - 25 mg/dL 29(H) 27(H) 25(H)  Creatinine 0.70 - 1.11 mg/dL 1.25(H) 1.28(H) 1.14  Sodium 135 - 146 mmol/L 137 134(L) 134(L)  Potassium 3.5 - 5.3 mmol/L 4.3 3.6 3.6  Chloride 98 - 110 mmol/L 98 92(L) 91(L)  CO2 20 - 31 mmol/L 31 32 34(H)  Calcium 8.6 - 10.3 mg/dL 8.3(L) 8.1(L) 8.2(L)     Hepatic Function Latest Ref Rng & Units 02/24/2016 02/17/2016 02/07/2016  Total Protein 6.1 - 8.1 g/dL 5.7(L) - 5.5(L)  Albumin 3.6 - 5.1 g/dL 3.0(L) - 2.6(L)  AST 10 - 35 U/L 44(H) 42(A) 70(H)  ALT 9 - 46 U/L 56(H) 59(A) 123(H)  Alk Phosphatase 40 - 115 U/L 50 68 41  Total  Bilirubin 0.2 - 1.2 mg/dL 1.0 - 1.0  Bilirubin, Direct 0.1 - 0.5 mg/dL - - 0.3    CBC Latest Ref Rng & Units 03/07/2016 03/03/2016 03/02/2016  WBC 4.0 - 10.5 K/uL 6.4 6.3 7.5  Hemoglobin 13.0 - 17.0 g/dL 10.5(L) 10.4(L) 10.7(L)  Hematocrit 39.0 - 52.0 % 33.5(L) 32.4(L) 33.8(L)  Platelets 150 - 400 K/uL 231 216 235   Lab Results  Component Value Date  MCV 93.1 03/07/2016   MCV 92.6 03/03/2016   MCV 92.3 03/02/2016   No results found for: TSH Lab Results  Component Value Date   HGBA1C 5.9 (H) 02/02/2016     BNP    Component Value Date/Time   BNP 745.7 (H) 02/24/2016 1610   BNP 552.7 (H) 02/24/2016 1409    ProBNP No results found for: PROBNP   Lipid Panel     Component Value Date/Time   CHOL 131 02/02/2016 2318   TRIG 61 02/02/2016 2318   HDL 44 02/02/2016 2318   CHOLHDL 3.0 02/02/2016 2318   VLDL 12 02/02/2016 2318   LDLCALC 75 02/02/2016 2318    RADIOLOGY: No results found.   Additional studies/ records that were reviewed today include:  I reviewed the patient's hospitalizations, cardiac catheterization reports, echo Doppler studies.    ASSESSMENT:    1. PAF (paroxysmal atrial fibrillation) ([place])   2. Coronary artery disease involving native coronary artery of native heart without angina pectoris   3. Medication management   4. Chronic combined systolic and diastolic heart failure ([place])   5. Bilateral lower extremity edema   6. Hyperlipidemia with target LDL less than 70   7. Elevated liver enzymes      PLAN:  Mr. Gopal is an 83 year old Caucasian male who suffered an inferolateral ST segment elevation myocardial infarction on 02/02/2016 and underwent successful stenting of a totally occluded OM 2 vessel.  He has concomitant CAD.  He initially was hypotensive and required balloon pump insertion.  He has had a complex history.  Subsequently and had developed a pericardial effusion, atrial fibrillation, significant weight gain and lower extremity  edema.  When I last saw him, he had significant lower extremity edema.  Recently, he has been taking Lasix at just 20 mg daily.  I've suggested that he alternate 40 mg with 20 g every other day.  He is now on an increased carvedilol regimen at 6.25 twice a day his ventricular rate is 67 and he is maintaining sinus rhythm.  Max 4 improved urinary stream.  He continues to be on dual antiplatelet therapy for his recent DES stent implantation.  There is no bleeding.  He is on atorvastatin 20, no grams for hyperlipidemia with target LDL less than 70.  He has stage III chronic kidney disease.  In November he had mild transaminase elevation.  I will recheck fasting laboratory to make certain that his LFTs are not further increased.  Adjustments to his medical regimen will be made if necessary.  I will contact him regarding his laboratory.  I will see him in 3 months for reevaluation.  Medication Adjustments/Labs and Tests Ordered: Current medicines are reviewed at length with the patient today.  Concerns regarding medicines are outlined above.  Medication changes, Labs and Tests ordered today are listed in the Patient Instructions below.  Patient Instructions  Your physician has recommended you make the following change in your medication:   1.)  The furosemide has been changed to 40 mg( 1 tablet) on even days of the month and 20 mg (1/2 tablet) on odd days of the month.  Your physician recommends that you schedule a follow-up appointment and blood work in 3 months. Lab slips have been provided to you today.    Time spent: 30 minutes   Signed, Shelva Majestic, MD, [facility] 06/11/2016 6:50 PM    [facility] [address] [place], [place], [place]  [address] Phone: [phone]

## 2016-06-17 ENCOUNTER — Telehealth: Payer: Self-pay | Admitting: Cardiovascular Disease

## 2016-06-17 DIAGNOSIS — I2510 Atherosclerotic heart disease of native coronary artery without angina pectoris: Secondary | ICD-10-CM

## 2016-06-17 DIAGNOSIS — I480 Paroxysmal atrial fibrillation: Secondary | ICD-10-CM

## 2016-06-17 NOTE — Telephone Encounter (Signed)
Discussed with Dr Tresa Endo and he recommends decreasing Furosemide to one tablet daily, compression stockings, and ok to use extra Furosemide as needed for swelling Advised patient and left message for daughter to call back  When daughter calls back need to verify Furosemide mg

## 2016-06-17 NOTE — Telephone Encounter (Signed)
New Message    Pt c/o medication issue:  1. Name of Medication: furosemide (LASIX) 40 MG tablet  2. How are you currently taking this medication (dosage and times per day)? 40 mg  3. Are you having a reaction (difficulty breathing--STAT)? dizziness  4. What is your medication issue? Doubling medicine causes dizziness

## 2016-06-17 NOTE — Telephone Encounter (Signed)
Spoke with patients daughter, ok per DPR  Patient was instructed at last ov on 3/7 with Dr Tresa Endo to increase his Lasix to 2 tablets on even days and 1 tablet on odd days. Yesterday he took the 2 tablets and did not feel well at all. Per daughter patient feels dizzy and lightheaded when he takes the 2 tablets. Yesterday felt lightheaded most times when he would stand up. Blood pressure readings Sunday 95/55 and yesterday 102/63. Tried to call patient to get more information on blood pressures but did not get an answer. Daughter also wanted for Dr Tresa Endo to be aware that he is currently taking the Flomax which was restarted around 06/04/16, wife had stopped previously thinking it did not need to be continued. Advised daughter would discuss with Dr Tresa Endo and call back with further recommendations.

## 2016-06-18 MED ORDER — FUROSEMIDE 20 MG PO TABS: ORAL_TABLET | ORAL | 3 refills | Status: DC

## 2016-06-18 NOTE — Telephone Encounter (Signed)
New Message ° ° pt daughter verbalized that she is returning call for rn  °

## 2016-06-18 NOTE — Telephone Encounter (Signed)
Left message for pt dtr to call  

## 2016-06-18 NOTE — Telephone Encounter (Signed)
Advised daughter, verbalized understanding  Called [facility] and d/c Furosemide 40 mg and sent to Rx for 20 mg

## 2016-07-07 ENCOUNTER — Telehealth: Payer: Self-pay | Admitting: Cardiovascular Disease

## 2016-07-07 ENCOUNTER — Emergency Department (HOSPITAL_COMMUNITY): Payer: PPO

## 2016-07-07 ENCOUNTER — Encounter (HOSPITAL_COMMUNITY): Payer: Self-pay | Admitting: Emergency Medicine

## 2016-07-07 ENCOUNTER — Telehealth: Payer: Self-pay | Admitting: Physician Assistant

## 2016-07-07 ENCOUNTER — Emergency Department (HOSPITAL_COMMUNITY)
Admission: EM | Admit: 2016-07-07 | Discharge: 2016-07-08 | Disposition: A | Discharge status: Home / Self Care | Payer: PPO | Attending: Emergency Medicine | Admitting: Emergency Medicine

## 2016-07-07 DIAGNOSIS — Z7982 Long term (current) use of aspirin: Secondary | ICD-10-CM | POA: Diagnosis not present

## 2016-07-07 DIAGNOSIS — I252 Old myocardial infarction: Secondary | ICD-10-CM | POA: Diagnosis not present

## 2016-07-07 DIAGNOSIS — I2510 Atherosclerotic heart disease of native coronary artery without angina pectoris: Secondary | ICD-10-CM | POA: Insufficient documentation

## 2016-07-07 DIAGNOSIS — I110 Hypertensive heart disease with heart failure: Secondary | ICD-10-CM | POA: Diagnosis not present

## 2016-07-07 DIAGNOSIS — R042 Hemoptysis: Secondary | ICD-10-CM | POA: Diagnosis not present

## 2016-07-07 DIAGNOSIS — K920 Hematemesis: Secondary | ICD-10-CM | POA: Diagnosis not present

## 2016-07-07 DIAGNOSIS — J90 Pleural effusion, not elsewhere classified: Secondary | ICD-10-CM | POA: Diagnosis not present

## 2016-07-07 DIAGNOSIS — R0602 Shortness of breath: Secondary | ICD-10-CM | POA: Insufficient documentation

## 2016-07-07 DIAGNOSIS — N183 Chronic kidney disease, stage 3 (moderate): Secondary | ICD-10-CM | POA: Diagnosis not present

## 2016-07-07 DIAGNOSIS — I130 Hypertensive heart and chronic kidney disease with heart failure and stage 1 through stage 4 chronic kidney disease, or unspecified chronic kidney disease: Secondary | ICD-10-CM | POA: Insufficient documentation

## 2016-07-07 DIAGNOSIS — Z79899 Other long term (current) drug therapy: Secondary | ICD-10-CM | POA: Insufficient documentation

## 2016-07-07 DIAGNOSIS — J449 Chronic obstructive pulmonary disease, unspecified: Secondary | ICD-10-CM | POA: Insufficient documentation

## 2016-07-07 DIAGNOSIS — I5043 Acute on chronic combined systolic (congestive) and diastolic (congestive) heart failure: Secondary | ICD-10-CM | POA: Insufficient documentation

## 2016-07-07 DIAGNOSIS — Z96651 Presence of right artificial knee joint: Secondary | ICD-10-CM | POA: Diagnosis not present

## 2016-07-07 LAB — COMPREHENSIVE METABOLIC PANEL
ALK PHOS: 55 U/L (ref 38–126)
ALT: 16 U/L — ABNORMAL LOW (ref 17–63)
ANION GAP: 8 (ref 5–15)
AST: 21 U/L (ref 15–41)
Albumin: 3.7 g/dL (ref 3.5–5.0)
BILIRUBIN TOTAL: 0.9 mg/dL (ref 0.3–1.2)
BUN: 31 mg/dL — ABNORMAL HIGH (ref 6–20)
CALCIUM: 9.2 mg/dL (ref 8.9–10.3)
CO2: 28 mmol/L (ref 22–32)
Chloride: 107 mmol/L (ref 101–111)
Creatinine, Ser: 1.37 mg/dL — ABNORMAL HIGH (ref 0.61–1.24)
GFR, EST AFRICAN AMERICAN: 53 mL/min — AB (ref >60)
GFR, EST NON AFRICAN AMERICAN: 46 mL/min — AB (ref >60)
Glucose, Bld: 125 mg/dL — ABNORMAL HIGH (ref 65–99)
Potassium: 4.4 mmol/L (ref 3.5–5.1)
Sodium: 143 mmol/L (ref 135–145)
Total Protein: 6.7 g/dL (ref 6.5–8.1)

## 2016-07-07 LAB — CBC
HCT: 37.5 % — ABNORMAL LOW (ref 39.0–52.0)
Hemoglobin: 12 g/dL — ABNORMAL LOW (ref 13.0–17.0)
MCH: 29.9 pg (ref 26.0–34.0)
MCHC: 32 g/dL (ref 30.0–36.0)
MCV: 93.3 fL (ref 78.0–100.0)
PLATELETS: 147 10*3/uL — AB (ref 150–400)
RBC: 4.02 MIL/uL — AB (ref 4.22–5.81)
RDW: 16.9 % — ABNORMAL HIGH (ref 11.5–15.5)
WBC: 5.6 10*3/uL (ref 4.0–10.5)

## 2016-07-07 LAB — ABO/RH: ABO/RH(D): O NEG

## 2016-07-07 LAB — TYPE AND SCREEN
ABO/RH(D): O NEG
Antibody Screen: NEGATIVE

## 2016-07-07 NOTE — Telephone Encounter (Signed)
Paged by answering service, patient was in USOH when coughed up blood while using bathroom. No fever, chills, chest pain or shortness of breath. Bleeding return while on phone. Spoke with wife Menelik Kosik). She will bring patient to ER for evaluation.

## 2016-07-07 NOTE — ED Provider Notes (Signed)
[facility]-EMERGENCY DEPT Provider Note   CSN: [id number] Arrival date & time: 07/07/16  1833   By signing my name below, I, Arianna Nassar, attest that this documentation has been prepared under the direction and in the presence of Shon Baton, MD.  Electronically Signed: Octavia Heir, ED Scribe. 07/07/16. 11:22 PM.    History   Chief Complaint Chief Complaint  Patient presents with  . Hematemesis   The history is provided by the patient. No language interpreter was used.   HPI Comments: Danny Oneill is a 83 y.o. male who has PMhx of anemia, aortic insufficiency, asbestosis, CAD, ischemic cardiomyopathy, CKD, HLD, hyponatremia, MI, PAF, and pericardial effusion presents to the Emergency Department complaining of acute onset, moderate hemoptysis that brought up blood tinged sputum at 5:30 this evening (~ 6 hours ago). Pt says the blood was bright red and appeared to be clot like. It was self limited. No additional clots coughed up. He is sure that it was coughing and not vomiting. He expresses exertional shortness of breath for the past several months. Relative states pt has also been having associated bilateral leg swelling and scrotum swelling for the past 1.5 weeks which is more close to his baseline. They report pt had similar swelling after his MI but notes the swelling alleviated after being placed on Lasix by his cardiologist. Pt reports having an MI in October 2017. He is currently on Brilinta and one 81 mg ASA daily. He denies fever, nausea ,vomiting, melena, blood in stool.   PCP: Dr. Geoffry Paradise Cardiologist: Dr. [name] at [name]  Past Medical History:  Diagnosis Date  . Anemia   . Aortic insufficiency 03/09/2016   a. mild-mod AI by echo 03/04/16.  . Arthritis   . Asbestosis (HCC)    "no breathing problems" pt states worked in [employer] for many yrs and was exposed to asbestosis  . Bladder outlet obstruction 03/09/2016  . CAD (coronary artery  disease)    a. 02/2016: inf lat STEMI with occluded OM2 which was treated with DES; otherwise he had moderate 50% oRCA, 40% oLM, 70% D1, 10% mLAD, 80% lateral 2nd marg which was treated medically.  . Cardiomyopathy, ischemic   . Chronic combined systolic and diastolic CHF (congestive heart failure) (HCC)    a. EF 30-35% at time of STEMI 02/2016, improved to 40-45% by echo 11/20 (but with pericardial effusion). b. Further improved EF to 55-60% 03/04/16.  Marland Kitchen CKD (chronic kidney disease), stage III   . Coronary artery disease   . Dyspnea   . First degree AV block   . Hyperlipidemia   . Hyponatremia   . Lumbar spondylosis   . Myocardial infarction   . Orthostatic hypotension   . PAF (paroxysmal atrial fibrillation) (HCC)    a. dx 02/2016: not anticoagulated due to pericardial effusion; amiodarone stopped due to suspected allergic drug rash.  . Pericardial effusion    a. 02/2016 - followed clinically (mod-large at first then small-mod in f/u echo).    Patient Active Problem List   Diagnosis Date Noted  . Allergic drug rash 03/09/2016  . Orthostatic hypotension 03/09/2016  . Hyponatremia 03/09/2016  . Hypokalemia 03/09/2016  . Bladder outlet obstruction 03/09/2016  . Urinary retention 03/09/2016  . First degree AV block 03/09/2016  . Aortic insufficiency 03/09/2016  . CKD (chronic kidney disease), stage III 03/09/2016  . Anemia   . Pericardial effusion 02/27/2016  . PAF (paroxysmal atrial fibrillation) (HCC) 02/27/2016  . CAD (coronary artery disease)  02/27/2016  . Cardiomyopathy, ischemic 02/27/2016  . Acute on chronic combined systolic and diastolic CHF (congestive heart failure) (HCC) 02/24/2016  . Systolic and diastolic CHF, acute (HCC) 02/13/2016  . Anterior and lateral ST segment elevation (HCC) 02/13/2016  . ARF (acute renal failure) (HCC) 02/13/2016  . Acute urinary retention 02/13/2016  . Hematuria 02/13/2016  . Elevated liver enzymes 02/13/2016  . Bilateral lower  extremity edema 02/13/2016  . Low BP 02/13/2016  . Hyperlipidemia 02/13/2016  . Acute ST elevation myocardial infarction (STEMI) (HCC)   . Acute inferolateral myocardial infarction (HCC) 02/02/2016  . Acute MI, inferolateral wall, initial episode of care (HCC)   . Arterial hypotension   . Overweight (BMI 25.0-29.9) 10/04/2013  . Expected blood loss anemia 10/04/2013  . S/P right TKA 10/03/2013    Past Surgical History:  Procedure Laterality Date  . CARDIAC CATHETERIZATION N/A 02/02/2016   Procedure: Left Heart Cath and Coronary Angiography;  Surgeon: Corky Crafts, MD;  Location: [facility] INVASIVE CV LAB;  Service: Cardiovascular;  Laterality: N/A;  . CARDIAC CATHETERIZATION N/A 02/02/2016   Procedure: Coronary Stent Intervention;  Surgeon: Corky Crafts, MD;  Location: [facility] INVASIVE CV LAB;  Service: Cardiovascular;  Laterality: N/A;  . CARDIAC CATHETERIZATION N/A 02/02/2016   Procedure: IABP Insertion;  Surgeon: Corky Crafts, MD;  Location: [facility] INVASIVE CV LAB;  Service: Cardiovascular;  Laterality: N/A;  . CATARACTS REMOVED     BIL  . ROTATOR CUFF REPAIR  2011   L SHOULDER  . TOTAL KNEE ARTHROPLASTY Right 10/03/2013   Procedure: RIGHT TOTAL KNEE ARTHROPLASTY;  Surgeon: [name], MD;  Location: [facility] ORS;  Service: Orthopedics;  Laterality: Right;       Home Medications    Prior to Admission medications   Medication Sig Start Date End Date Taking? Authorizing Provider  aspirin EC 81 MG tablet Take 81 mg by mouth daily with breakfast.    Yes Historical Provider, MD  atorvastatin (LIPITOR) 20 MG tablet Take 1 tablet (20 mg total) by mouth daily. 04/07/16  Yes Lennette Bihari, MD  carvedilol (COREG) 6.25 MG tablet Take 1 tablet (6.25 mg total) by mouth 2 (two) times daily. Patient taking differently: Take 3.125 mg by mouth 2 (two) times daily.  04/28/16  Yes Lennette Bihari, MD  furosemide (LASIX) 20 MG tablet Take 1 tablet by mouth daily and ok to use extra tablet as  needed for swelling. 06/18/16  Yes Lennette Bihari, MD  pantoprazole (PROTONIX) 40 MG tablet Take 1 tablet (40 mg total) by mouth daily. Patient taking differently: Take 40 mg by mouth daily as needed (acid reflux).  04/07/16  Yes Lennette Bihari, MD  tamsulosin (FLOMAX) 0.4 MG CAPS capsule TAKE 1 CAPSULE(0.4 MG) BY MOUTH DAILY AFTER SUPPER 04/01/16  Yes Dayna N Dunn, PA-C  ticagrelor (BRILINTA) 90 MG TABS tablet Take 1 tablet (90 mg total) by mouth 2 (two) times daily. 04/07/16  Yes Lennette Bihari, MD    Family History Family History  Problem Relation Age of Onset  . Cancer Mother     Social History Social History  Substance Use Topics  . Smoking status: Never Smoker  . Smokeless tobacco: Never Used  . Alcohol use No     Allergies   Amiodarone and Morphine and related   Review of Systems Review of Systems  Constitutional: Negative for fever.  Respiratory: Positive for cough and shortness of breath.   Cardiovascular: Positive for leg swelling.  Gastrointestinal: Negative for  blood in stool, diarrhea, nausea and vomiting.  All other systems reviewed and are negative.    Physical Exam Updated Vital Signs BP 130/61   Pulse 66   Temp 97.9 F (36.6 C) (Oral)   Resp 17   SpO2 99%   Physical Exam  Constitutional: He is oriented to person, place, and time. No distress.  Elderly, no acute distress  HENT:  Head: Normocephalic and atraumatic.  Mouth/Throat: Oropharynx is clear and moist.  Eyes: Pupils are equal, round, and reactive to light.  Cardiovascular: Normal rate, regular rhythm and normal heart sounds.   Pulmonary/Chest: Effort normal. No respiratory distress. He has no wheezes.  Fine crackles bilaterally  Abdominal: Soft. Bowel sounds are normal. There is no tenderness. There is no rebound.  Musculoskeletal: He exhibits edema.  1+ pitting bilateral lower extremity edema  Neurological: He is alert and oriented to person, place, and time.  Skin: Skin is warm and dry.    Psychiatric: He has a normal mood and affect.  Nursing note and vitals reviewed.    ED Treatments / Results  DIAGNOSTIC STUDIES: Oxygen Saturation is 98% on RA, normal by my interpretation.  COORDINATION OF CARE:  11:20 PM Discussed treatment plan with pt at bedside and pt agreed to plan.  Labs (all labs ordered are listed, but only abnormal results are displayed) Labs Reviewed  COMPREHENSIVE METABOLIC PANEL - Abnormal; Notable for the following:       Result Value   Glucose, Bld 125 (*)    BUN 31 (*)    Creatinine, Ser 1.37 (*)    ALT 16 (*)    GFR calc non Af Amer 46 (*)    GFR calc Af Amer 53 (*)    All other components within normal limits  CBC - Abnormal; Notable for the following:    RBC 4.02 (*)    Hemoglobin 12.0 (*)    HCT 37.5 (*)    RDW 16.9 (*)    Platelets 147 (*)    All other components within normal limits  POC OCCULT BLOOD, ED  TYPE AND SCREEN  ABO/RH    EKG  EKG Interpretation  Date/Time:  Tuesday July 07 2016 18:51:08 EDT Ventricular Rate:  69 PR Interval:  224 QRS Duration: 58 QT Interval:  420 QTC Calculation: 450 R Axis:   58 Text Interpretation:  Sinus rhythm with 1st degree A-V block with frequent and consecutive Premature ventricular complexes Low voltage QRS Nonspecific ST and T wave abnormality Abnormal ECG Confirmed by Wilkie Aye  MD, Jaece Ducharme ([id number]) on 07/07/2016 11:05:38 PM       Radiology Dg Chest 2 View  Result Date: 07/07/2016 CLINICAL DATA:  Shortness of breath and hemoptysis EXAM: CHEST  2 VIEW COMPARISON:  February 24, 2016 FINDINGS: There is extensive pleural plaque formation consistent with previous asbestos exposure. There is extensive calcification along each hemidiaphragm, also consistent with previous asbestos exposure. There is no appreciable edema or consolidation. There is chronic blunting of each costophrenic angle, likely due to scarring, although small chronic pleural effusions cannot be excluded. This appearance is  stable. Heart is upper normal in size with pulmonary vascularity within normal limits. There is atherosclerotic calcification aorta. No adenopathy. No evident bone lesions. IMPRESSION: Widespread changes of asbestos exposure with pleural plaques and areas of diaphragmatic calcification. No frank edema or consolidation. Subtle infiltrate could easily be obscured by the degree of plaque formation bilaterally. Stable cardiac silhouette. There is aortic atherosclerosis. No evident adenopathy. Electronically Signed   By:  Bretta Bang III M.D.   On: 07/07/2016 20:01   Ct Chest W Contrast  Result Date: 07/08/2016 CLINICAL DATA:  Spitting up blood, hemoptysis. EXAM: CT CHEST WITH CONTRAST TECHNIQUE: Multidetector CT imaging of the chest was performed during intravenous contrast administration. CONTRAST:  75mL ISOVUE-300 IOPAMIDOL (ISOVUE-300) INJECTION 61% COMPARISON:  07/07/2016 CXR FINDINGS: Cardiovascular: Normal branch pattern for the great vessels. There is aortic atherosclerosis with aneurysmal dilatation of the ascending aorta up to 4.3 cm in caliber. No dissection. Dilatation of the main pulmonary artery to 4 cm consistent with pulmonary hypertension no central pulmonary embolus. Trace pericardial effusion or thickening. There is three-vessel coronary arteriosclerosis. Mediastinum/Nodes: Small reactive lymph nodes in the mediastinum and hila. Patent trachea mainstem bronchi. Unremarkable esophagus. Slight retrosternal extension of the thyroid gland. Lungs/Pleura: Small bilateral pleural effusions and bilateral areas of pleural thickening associated with calcified pleural plaque consistent with prior asbestos exposure. Minimal bronchiectasis. Minimal bronchiectasis. Subpleural bandlike areas of atelectasis and/or scarring in the upper lobes. Upper Abdomen: No acute abnormality. Musculoskeletal: No chest wall abnormality. No acute or significant osseous findings. Degenerative changes are seen along the dorsal  spine osteophytes. One IMPRESSION: Small bilateral pleural effusions. Calcified and noncalcified pleural plaquing bilaterally consistent prior asbestos exposure. Minimal bronchiectasis. Electronically Signed   By: Tollie Eth M.D.   On: 07/08/2016 01:21    Procedures Procedures (including critical care time)  Medications Ordered in ED Medications  iopamidol (ISOVUE-300) 61 % injection (75 mLs  Contrast Given 07/08/16 0036)     Initial Impression / Assessment and Plan / ED Course  I have reviewed the triage vital signs and the nursing notes.  Pertinent labs & imaging results that were available during my care of the patient were reviewed by me and considered in my medical decision making (see chart for details).     Patient presents with reported bloody sputum. Occurred earlier today. Somewhat self-limited. Family has also noted some increased swelling of the scrotum over the last week. They did call the cardiology office. Instructed to increase Lasix to 2 pills daily for 3 days. He is in no acute distress. ABCs intact. 97% on room air. He has fine crackles but otherwise is nontoxic-appearing. Lab work and chest x-ray reviewed and largely unchanged. Hemoglobin is stable. He denies any gastrointestinal bleeding. CT scan of the chest with contrast was obtained to evaluate for pulmonary hemorrhage.  CT notable for asbestos changes as well as small bilateral pleural effusions. Given recent swelling over the last week, likely has a mild acute heart failure exacerbation. On recheck, he is on room air. Pulmonary exam remains reassuring and he is in no acute distress. Discussed the workup with the family. Answered all of their questions. At this time it is unclear the etiology of the blood that he noted earlier today. However, he does not appear to have an acute emergent condition. Monitor closely and follow-up with primary physician and cardiology.  After history, exam, and medical workup I feel the  patient has been appropriately medically screened and is safe for discharge home. Pertinent diagnoses were discussed with the patient. Patient was given return precautions.   Final Clinical Impressions(s) / ED Diagnoses   Final diagnoses:  Bloody sputum  Acute on chronic combined systolic and diastolic congestive heart failure (HCC)   I personally performed the services described in this documentation, which was scribed in my presence. The recorded information has been reviewed and is accurate.  New Prescriptions New Prescriptions   No medications on  file     Shon Baton, MD 07/08/16 [phone]

## 2016-07-07 NOTE — ED Triage Notes (Addendum)
Pt reports "spitting up blood" several times starting around 1630 this afternoon. Pt reports blood was bright red. Pt denies abdominal pain or dark or bloody stools but reports some sob pta. Pt reports recent MI. resp e/u, skin warm and dry, nad.

## 2016-07-07 NOTE — Telephone Encounter (Signed)
Pt says he is having some swelling in his groin area,pt is a sluggish.Please call to advise him  him.Marland Kitchen

## 2016-07-07 NOTE — Telephone Encounter (Signed)
Returned call to Danny Oneill - daughter Patient c/o swelling around testicle area This issue occurred last Oct when he has his MI He went to facility after MI and had CHF and had this same swelling  A few weeks ago, patient was advised to take lasix 20mg  daily with extra 20mg  daily as needed for swelling. He has only been taking 1 tablet daily. Advised that he take PRN lasix for next 3 days, monitor weights daily. Call back Friday if no improvement w/edema and/or if develops SOB, weight gain for 3lbs in 24 hours or 5lbs in 1 week. Advised to monitor BP and if readings are consistently under 100 SBP + symptoms, they should contact our office.  She will notify her father of instructions.  Will route to MD as FYI/for further recommendations

## 2016-07-08 ENCOUNTER — Emergency Department (HOSPITAL_COMMUNITY): Payer: PPO

## 2016-07-08 ENCOUNTER — Encounter (HOSPITAL_COMMUNITY): Payer: Self-pay

## 2016-07-08 DIAGNOSIS — J90 Pleural effusion, not elsewhere classified: Secondary | ICD-10-CM | POA: Diagnosis not present

## 2016-07-08 MED ORDER — IOPAMIDOL (ISOVUE-300) INJECTION 61%
INTRAVENOUS | Status: AC
  Administered 2016-07-08: 75 mL
  Filled 2016-07-08: qty 75

## 2016-07-08 NOTE — Discharge Instructions (Signed)
You were seen today for blood-tinged sputum. Your workup is reassuring. You do have evidence of mild acute on chronic heart failure. Increase her diuretic as instructed by her cardiologist earlier today. If you develop worsening shortness of breath, increased sputum production, or more blood you need to be reevaluated immediately.

## 2016-07-11 ENCOUNTER — Telehealth: Payer: Self-pay | Admitting: Physician Assistant

## 2016-07-11 NOTE — Telephone Encounter (Signed)
83 y.o. male with a history of CAD status post inferior STEMI in 10/17 treated with a DES to the LCx, systolic heart failure secondary to ischemic cardiomyopathy EF 45-50 by echo 2/18. Seen in the ED 4/3 with hemoptysis thought to be related to decompensated congestive heart failure.  His Lasix had already been adjusted by our office the day he went to the ED.  His wife called the answering service today because he lost his balance this morning while he was getting the paper and fell on his hip. He was holding on to the handrail the whole time and does not feel like he has significant injuries. He is sore and would like to take Tylenol. They called to see if this would be okay.  PLAN: I advised the patient's wife that he may take up to 1000 mg of Tylenol every 6 hours as needed for pain. If his pain worsens, he has difficulty bearing weight on his leg or he develops significant ecchymosis, he should go to the ED.  Signed,  Tereso Newcomer, PA-C    07/11/2016 2:31 PM

## 2016-07-12 ENCOUNTER — Encounter (HOSPITAL_BASED_OUTPATIENT_CLINIC_OR_DEPARTMENT_OTHER): Payer: Self-pay | Admitting: *Deleted

## 2016-07-12 ENCOUNTER — Emergency Department (HOSPITAL_BASED_OUTPATIENT_CLINIC_OR_DEPARTMENT_OTHER): Payer: PPO

## 2016-07-12 ENCOUNTER — Emergency Department (HOSPITAL_BASED_OUTPATIENT_CLINIC_OR_DEPARTMENT_OTHER)
Admission: EM | Admit: 2016-07-12 | Discharge: 2016-07-12 | Disposition: A | Discharge status: Home / Self Care | Payer: PPO | Attending: Emergency Medicine | Admitting: Emergency Medicine

## 2016-07-12 DIAGNOSIS — Z79899 Other long term (current) drug therapy: Secondary | ICD-10-CM | POA: Insufficient documentation

## 2016-07-12 DIAGNOSIS — I5042 Chronic combined systolic (congestive) and diastolic (congestive) heart failure: Secondary | ICD-10-CM | POA: Insufficient documentation

## 2016-07-12 DIAGNOSIS — S41112A Laceration without foreign body of left upper arm, initial encounter: Secondary | ICD-10-CM | POA: Insufficient documentation

## 2016-07-12 DIAGNOSIS — M545 Low back pain, unspecified: Secondary | ICD-10-CM

## 2016-07-12 DIAGNOSIS — Y929 Unspecified place or not applicable: Secondary | ICD-10-CM | POA: Diagnosis not present

## 2016-07-12 DIAGNOSIS — I2510 Atherosclerotic heart disease of native coronary artery without angina pectoris: Secondary | ICD-10-CM | POA: Diagnosis not present

## 2016-07-12 DIAGNOSIS — M5489 Other dorsalgia: Secondary | ICD-10-CM | POA: Diagnosis not present

## 2016-07-12 DIAGNOSIS — Y9389 Activity, other specified: Secondary | ICD-10-CM | POA: Insufficient documentation

## 2016-07-12 DIAGNOSIS — N183 Chronic kidney disease, stage 3 (moderate): Secondary | ICD-10-CM | POA: Diagnosis not present

## 2016-07-12 DIAGNOSIS — S4992XA Unspecified injury of left shoulder and upper arm, initial encounter: Secondary | ICD-10-CM | POA: Diagnosis not present

## 2016-07-12 DIAGNOSIS — Y999 Unspecified external cause status: Secondary | ICD-10-CM | POA: Insufficient documentation

## 2016-07-12 DIAGNOSIS — Z7982 Long term (current) use of aspirin: Secondary | ICD-10-CM | POA: Diagnosis not present

## 2016-07-12 DIAGNOSIS — W010XXA Fall on same level from slipping, tripping and stumbling without subsequent striking against object, initial encounter: Secondary | ICD-10-CM | POA: Insufficient documentation

## 2016-07-12 MED ORDER — LORAZEPAM 1 MG PO TABS
0.5000 mg | ORAL_TABLET | Freq: Once | ORAL | Status: AC
  Administered 2016-07-12: 0.5 mg via ORAL
  Filled 2016-07-12: qty 1

## 2016-07-12 MED ORDER — TRAMADOL HCL 50 MG PO TABS: 50.0000 mg | ORAL_TABLET | Freq: Three times a day (TID) | ORAL | 0 refills | Status: DC | PRN

## 2016-07-12 MED ORDER — HYDROMORPHONE HCL 1 MG/ML IJ SOLN
0.5000 mg | Freq: Once | INTRAMUSCULAR | Status: DC
  Filled 2016-07-12: qty 1

## 2016-07-12 NOTE — ED Notes (Signed)
Wound covered with gauze and kerlix.

## 2016-07-12 NOTE — ED Provider Notes (Signed)
[facility] Provider Note   CSN: [id number] Arrival date & time: 07/12/16  1324  By signing my name below, I, Danny Oneill, attest that this documentation has been prepared under the direction and in the presence of Danny Razor, MD . Electronically Signed: Freida Oneill, Scribe. 07/12/2016. 10:11 AM.  History   Chief Complaint Chief Complaint  Patient presents with  . Fall   The history is provided by the patient and a relative. No language interpreter was used.    HPI Comments:  Danny Oneill is a 83 y.o. male who presents to the Emergency Department s/p mechanical fall yesterday complaining of gradually worsening, lower back pain since the incident. He was going up indoor steps when he tripped and fell back ~ 3 feet but was able to grab hold of the railing. He notes associated wound to the LUE stemming from the fall; bleeding controlled PTA. He has ambulated since the fall. Pt denies LOC, HA, and dizziness. He has taken Tylenol for pain with little relief. Pt has a h/o CAD, MI, CKD, and CHF. He denies recent changes in breathing. His daughter notes recent increase in Lasix.   Danny Oneill - Cardiologist   Past Medical History:  Diagnosis Date  . Anemia   . Aortic insufficiency 03/09/2016   a. mild-mod AI by echo 03/04/16.  . Arthritis   . Asbestosis (HCC)    "no breathing problems" pt states worked in [employer] for many yrs and was exposed to asbestosis  . Bladder outlet obstruction 03/09/2016  . CAD (coronary artery disease)    a. 02/2016: inf lat STEMI with occluded OM2 which was treated with DES; otherwise he had moderate 50% oRCA, 40% oLM, 70% D1, 10% mLAD, 80% lateral 2nd marg which was treated medically.  . Cardiomyopathy, ischemic   . Chronic combined systolic and diastolic CHF (congestive heart failure) (HCC)    a. EF 30-35% at time of STEMI 02/2016, improved to 40-45% by echo 11/20 (but with pericardial effusion). b. Further improved EF to 55-60% 03/04/16.    Marland Kitchen CKD (chronic kidney disease), stage III   . Coronary artery disease   . Dyspnea   . First degree AV block   . Hyperlipidemia   . Hyponatremia   . Lumbar spondylosis   . Myocardial infarction   . Orthostatic hypotension   . PAF (paroxysmal atrial fibrillation) (HCC)    a. dx 02/2016: not anticoagulated due to pericardial effusion; amiodarone stopped due to suspected allergic drug rash.  . Pericardial effusion    a. 02/2016 - followed clinically (mod-large at first then small-mod in f/u echo).    Patient Active Problem List   Diagnosis Date Noted  . Allergic drug rash 03/09/2016  . Orthostatic hypotension 03/09/2016  . Hyponatremia 03/09/2016  . Hypokalemia 03/09/2016  . Bladder outlet obstruction 03/09/2016  . Urinary retention 03/09/2016  . First degree AV block 03/09/2016  . Aortic insufficiency 03/09/2016  . CKD (chronic kidney disease), stage III 03/09/2016  . Anemia   . Pericardial effusion 02/27/2016  . PAF (paroxysmal atrial fibrillation) (HCC) 02/27/2016  . CAD (coronary artery disease) 02/27/2016  . Cardiomyopathy, ischemic 02/27/2016  . Acute on chronic combined systolic and diastolic CHF (congestive heart failure) (HCC) 02/24/2016  . Systolic and diastolic CHF, acute (HCC) 02/13/2016  . Anterior and lateral ST segment elevation (HCC) 02/13/2016  . ARF (acute renal failure) (HCC) 02/13/2016  . Acute urinary retention 02/13/2016  . Hematuria 02/13/2016  . Elevated liver enzymes 02/13/2016  . Bilateral  lower extremity edema 02/13/2016  . Low BP 02/13/2016  . Hyperlipidemia 02/13/2016  . Acute ST elevation myocardial infarction (STEMI) (HCC)   . Acute inferolateral myocardial infarction (HCC) 02/02/2016  . Acute MI, inferolateral wall, initial episode of care (HCC)   . Arterial hypotension   . Overweight (BMI 25.0-29.9) 10/04/2013  . Expected blood loss anemia 10/04/2013  . S/P right TKA 10/03/2013    Past Surgical History:  Procedure Laterality Date  .  CARDIAC CATHETERIZATION N/A 02/02/2016   Procedure: Left Heart Cath and Coronary Angiography;  Surgeon: Corky Crafts, MD;  Location: [facility] INVASIVE CV LAB;  Service: Cardiovascular;  Laterality: N/A;  . CARDIAC CATHETERIZATION N/A 02/02/2016   Procedure: Coronary Stent Intervention;  Surgeon: Corky Crafts, MD;  Location: [facility] INVASIVE CV LAB;  Service: Cardiovascular;  Laterality: N/A;  . CARDIAC CATHETERIZATION N/A 02/02/2016   Procedure: IABP Insertion;  Surgeon: Corky Crafts, MD;  Location: [facility] INVASIVE CV LAB;  Service: Cardiovascular;  Laterality: N/A;  . CATARACTS REMOVED     BIL  . ROTATOR CUFF REPAIR  2011   L SHOULDER  . TOTAL KNEE ARTHROPLASTY Right 10/03/2013   Procedure: RIGHT TOTAL KNEE ARTHROPLASTY;  Surgeon: [name], MD;  Location: [facility] ORS;  Service: Orthopedics;  Laterality: Right;       Home Medications    Prior to Admission medications   Medication Sig Start Date End Date Taking? Authorizing Provider  aspirin EC 81 MG tablet Take 81 mg by mouth daily with breakfast.    Yes Historical Provider, MD  atorvastatin (LIPITOR) 20 MG tablet Take 1 tablet (20 mg total) by mouth daily. 04/07/16  Yes Lennette Bihari, MD  carvedilol (COREG) 6.25 MG tablet Take 1 tablet (6.25 mg total) by mouth 2 (two) times daily. Patient taking differently: Take 3.125 mg by mouth 2 (two) times daily.  04/28/16  Yes Lennette Bihari, MD  furosemide (LASIX) 20 MG tablet Take 1 tablet by mouth daily and ok to use extra tablet as needed for swelling. Patient taking differently: 40 mg. Take 1 tablet by mouth daily and ok to use extra tablet as needed for swelling. 06/18/16  Yes Lennette Bihari, MD  pantoprazole (PROTONIX) 40 MG tablet Take 1 tablet (40 mg total) by mouth daily. Patient taking differently: Take 40 mg by mouth daily as needed (acid reflux).  04/07/16  Yes Lennette Bihari, MD  tamsulosin (FLOMAX) 0.4 MG CAPS capsule TAKE 1 CAPSULE(0.4 MG) BY MOUTH DAILY AFTER SUPPER 04/01/16  Yes  Dayna N Dunn, PA-C  ticagrelor (BRILINTA) 90 MG TABS tablet Take 1 tablet (90 mg total) by mouth 2 (two) times daily. 04/07/16  Yes Lennette Bihari, MD    Family History Family History  Problem Relation Age of Onset  . Cancer Mother     Social History Social History  Substance Use Topics  . Smoking status: Never Smoker  . Smokeless tobacco: Never Used  . Alcohol use No     Allergies   Amiodarone and Morphine and related   Review of Systems Review of Systems  Musculoskeletal: Positive for back pain.  Skin: Positive for wound.  Neurological: Negative for dizziness, syncope, weakness and light-headedness.  All other systems reviewed and are negative.  Physical Exam Updated Vital Signs BP 124/67 (BP Location: Right Arm)   Pulse 80   Temp 98.7 F (37.1 C) (Oral)   Resp 16   Ht 6\' 2"  (1.88 m)   Wt 220 lb (99.8 kg)  SpO2 95%   BMI 28.25 kg/m   Physical Exam  Constitutional: He is oriented to person, place, and time. He appears well-developed and well-nourished.  HENT:  Head: Normocephalic and atraumatic.  Eyes: EOM are normal.  Neck: Normal range of motion.  Cardiovascular: Normal rate, regular rhythm, normal heart sounds and intact distal pulses.   Pulmonary/Chest: Effort normal and breath sounds normal. No respiratory distress.  Abdominal: Soft. He exhibits no distension. There is no tenderness.  Genitourinary:  Genitourinary Comments: Mild scrotal edema  Chaperone (scribe) was present for exam which was performed with no discomfort or complications.   Musculoskeletal: Normal range of motion. He exhibits edema.  Points to lower lumbar/sacral region as area of pain but pain is not reproducible  Pitting BLE edema  NVI in lower extremities   Neurological: He is alert and oriented to person, place, and time.  Skin: Skin is warm and dry.  Ecchymosis to left thoracic back but non tender  2 cm skin tear noted to left triceps area   Psychiatric: He has a normal mood  and affect. Judgment normal.  Nursing note and vitals reviewed.    ED Treatments / Results  DIAGNOSTIC STUDIES:  Oxygen Saturation is 96% on RA, normal by my interpretation.    COORDINATION OF CARE:  10:01 AM Discussed treatment plan with pt at bedside and pt agreed to plan.  Labs (all labs ordered are listed, but only abnormal results are displayed) Labs Reviewed - No data to display  EKG  EKG Interpretation None       Radiology No results found.  Procedures .Marland KitchenLaceration Repair Date/Time: 07/12/2016 11:23 AM Performed by: Danny Oneill Authorized by: Danny Oneill   Consent:    Consent obtained:  Verbal   Consent given by:  Patient Anesthesia (see MAR for exact dosages):    Anesthesia method:  None Laceration details:    Length (cm):  2 Exploration:    Contaminated: no   Treatment:    Area cleansed with:  Saline Skin repair:    Repair method:  Steri-Strips   Number of Steri-Strips:  5 Post-procedure details:    Patient tolerance of procedure:  Tolerated well, no immediate complications   (including critical care time)  Medications Ordered in ED Medications - No data to display   Initial Impression / Assessment and Plan / ED Course  I have reviewed the triage vital signs and the nursing notes.  Pertinent labs & imaging results that were available during my care of the patient were reviewed by me and considered in my medical decision making (see chart for details).     Final Clinical Impressions(s) / ED Diagnoses   Final diagnoses:  Acute midline low back pain without sciatica  Skin tears  New Prescriptions New Prescriptions   No medications on file   I personally preformed the services scribed in my presence. The recorded information has been reviewed is accurate. Danny Razor, MD.     Danny Razor, MD 07/20/16 [phone]

## 2016-07-12 NOTE — ED Triage Notes (Signed)
Pt reports he fell yesterday morning when he tripped while going up some indoor stairs. Reports landing on R hip/buttock. Denies hitting head. Presents today with lower back pain (R>L) and skin tear to L upper arm. Pt reports he's been able to ambulate with some difficulty.

## 2016-07-12 NOTE — ED Notes (Signed)
Patient transported to X-ray 

## 2016-07-12 NOTE — ED Notes (Signed)
ED Provider at bedside. 

## 2016-07-14 ENCOUNTER — Telehealth: Payer: Self-pay | Admitting: Cardiovascular Disease

## 2016-07-14 NOTE — Telephone Encounter (Signed)
New message      Daughter has some "concerns" and would like to talk to a nurse.  She would not tell me what the concerns were.  Please call

## 2016-07-14 NOTE — Telephone Encounter (Signed)
Returned call to patient's daughter Enid Derry Patient has been to ED twice in the past week  Daughter states it is like her dad reverted back to how he was before surgery He is unsteady, weak, walks with cane, gets short of breath more easily, is back in his lift chair She states she thinks this may be due to deconditioning  Patient is still taking lasix BID - per recommendations from last week regarding scrotal edema - and this is helping so they have continued increased dose (was recommended for a few days)  She would like her father to be evaluated - Dr. Landry Dyke first available is mid-May. Offered PA or NP appt and she would like her dad to see [place], [place] - scheduled for 4/16

## 2016-07-20 ENCOUNTER — Ambulatory Visit (INDEPENDENT_AMBULATORY_CARE_PROVIDER_SITE_OTHER): Payer: PPO | Admitting: Physician Assistant

## 2016-07-20 ENCOUNTER — Encounter: Payer: Self-pay | Admitting: Physician Assistant

## 2016-07-20 VITALS — BP 107/65 | HR 74 | Ht 74.0 in | Wt 216.0 lb

## 2016-07-20 DIAGNOSIS — I313 Pericardial effusion (noninflammatory): Secondary | ICD-10-CM

## 2016-07-20 DIAGNOSIS — I480 Paroxysmal atrial fibrillation: Secondary | ICD-10-CM

## 2016-07-20 DIAGNOSIS — I3139 Other pericardial effusion (noninflammatory): Secondary | ICD-10-CM

## 2016-07-20 DIAGNOSIS — I2510 Atherosclerotic heart disease of native coronary artery without angina pectoris: Secondary | ICD-10-CM | POA: Diagnosis not present

## 2016-07-20 DIAGNOSIS — M545 Low back pain, unspecified: Secondary | ICD-10-CM

## 2016-07-20 DIAGNOSIS — I5032 Chronic diastolic (congestive) heart failure: Secondary | ICD-10-CM | POA: Diagnosis not present

## 2016-07-20 DIAGNOSIS — R0609 Other forms of dyspnea: Secondary | ICD-10-CM

## 2016-07-20 DIAGNOSIS — I77810 Thoracic aortic ectasia: Secondary | ICD-10-CM | POA: Diagnosis not present

## 2016-07-20 NOTE — Progress Notes (Signed)
Cardiology Office Note    Date:  07/21/2016   ID:  Danny Oneill, DOB July 03, 1931, MRN [id number]  PCP:  [name], MD  Cardiologist:  Dr. Tresa Endo  Chief Complaint  Patient presents with  . Follow-up    seen for Dr. Tresa Endo. DOE x several weeks    History of Present Illness:  Danny Oneill is a 83 y.o. male with PMH of right knee arthroplasty and asbestosis exposure and CAD s/p inferolateral STEMI 02/02/2016. He underwent urgent cardiac catheterization on 10/29/2017which showed 50% ostial RCA lesion, 40% ostial left main lesion, 70% ostial D1 lesion, 100% ostial OM 2 occlusion treated with 2.25 x 16 mm DES. Hospital course was complicated by hypotension and required temporary balloon pump but no pressor. Echocardiogram obtained on 02/03/2016 which showed EF 35-40%, akinesis of the inferior myocardium, moderate AR. His balloon pump was discontinued on 10/31. Post procedure, his EKG showed persistent ST elevation. According to family history, he actually had symptom several days prior to arrival. Post procedure, he was placed on carvedilol, lisinopril, Lipitor, aspirin and Brilinta. He was evaluated by physical therapy, who recommended skilled nursing facility on discharge. He also has some elevated LFT as well, Lipitor was held pending return of liver function test back to normal.. Hemoglobin was 5.9 which was prediabetes range. During this hospitalization, he did have urinary retention requiring Foley placement. Urology was consulted, and he was discharged with a Foley and with plan to follow-up with urology in a week after discharge. He also had some hematuria likely due to trauma of Foley insertion. Since discharge, he did go back to the emergency room again for urinary retention. Based on nursing home note, his lisinopril was held due to hypotension  He has sign of fluid overload in November 2017. I started him on diuretic, however due to low voltage seen on the EKG at the time, there was  concern for pericardial effusion. I ordered a stat echocardiogram on the same day which showed EF 40-45%, hypokinesis of the inferior and inferolateral myocardium, grade 1 diastolic dysfunction, PA peak pressure 35 mmHg, mild AR, moderate to large free-flowing pericardial effusion was present third phase changes in stroke volume that was excessive, features consistent with mild diabetic neuropathy physiology. His admission in November, he was diuresed to 229 pounds with net -15 L output. Repeat echocardiogram obtained on 03/04/2016 showed improvement of her EF to 55-60%, mild to moderate AI, PASP 39 mmHg. He did develop atrial fibrillation with RVR during that hospitalization, this was treated with IV amiodarone and IV heparin. He was later placed on digoxin  I last saw the patient on 03/16/2016, he was maintaining sinus rhythm at bedtime. He was not on systemic anticoagulation at the time due to concern of hemorrhagic conversion. I considered a 30 day event monitor to see if there is any recurrence especially if his heart rate increased from 100. He was seen by Dr. Tresa Endo in both January and March, his Lasix was increased to alternating 40 mg and 20 mg every other day, this was eventually decreased to 20 mg daily as he was having dizziness on too much diuretic.   He presents today for evaluation of dyspnea on exertion has worsened in the last few weeks. He is actually taking 20 mg twice a day of Lasix. I have reviewed the last echocardiogram in February, he did not have any significant pericardial effusion at that time. He had a fall on 07/12/2016 and is still recovering from that. Today, he continued  to have back pain after the fall. Recent lumbar x-ray obtained in the ED on 4/80/2018 showed possible compression fracture. Family is going to take him to see orthopedic doctor. On physical exam, he does not appear to be volume overloaded. His weight is actually lower than the previous weight. I do not see any  evidence of acute congestive heart failure at this time. Based on recent labs, he appears to be dry, creatinine has trended up slightly from 1.0 up to 1.3 on the most recent lab work. It may be prudent to keep him at the current level of diuretic as he tends to accumulate fluid quite fast. I did ambulate him in the hallway, O2 saturation dropped as low as 91%, however majority of the time stays around 97%. Some of his dyspnea may be related to deconditioning. I recommended monitoring at this time. Otherwise he has been compliant with aspirin and Brilinta.   Past Medical History:  Diagnosis Date  . Anemia   . Aortic insufficiency 03/09/2016   a. mild-mod AI by echo 03/04/16.  . Arthritis   . Asbestosis (HCC)    "no breathing problems" pt states worked in [employer] for many yrs and was exposed to asbestosis  . Bladder outlet obstruction 03/09/2016  . CAD (coronary artery disease)    a. 02/2016: inf lat STEMI with occluded OM2 which was treated with DES; otherwise he had moderate 50% oRCA, 40% oLM, 70% D1, 10% mLAD, 80% lateral 2nd marg which was treated medically.  . Cardiomyopathy, ischemic   . Chronic combined systolic and diastolic CHF (congestive heart failure) (HCC)    a. EF 30-35% at time of STEMI 02/2016, improved to 40-45% by echo 11/20 (but with pericardial effusion). b. Further improved EF to 55-60% 03/04/16.  Marland Kitchen CKD (chronic kidney disease), stage III   . Coronary artery disease   . Dyspnea   . First degree AV block   . Hyperlipidemia   . Hyponatremia   . Lumbar spondylosis   . Myocardial infarction (HCC)   . Orthostatic hypotension   . PAF (paroxysmal atrial fibrillation) (HCC)    a. dx 02/2016: not anticoagulated due to pericardial effusion; amiodarone stopped due to suspected allergic drug rash.  . Pericardial effusion    a. 02/2016 - followed clinically (mod-large at first then small-mod in f/u echo).    Past Surgical History:  Procedure Laterality Date  . CARDIAC  CATHETERIZATION N/A 02/02/2016   Procedure: Left Heart Cath and Coronary Angiography;  Surgeon: Corky Crafts, MD;  Location: [facility] INVASIVE CV LAB;  Service: Cardiovascular;  Laterality: N/A;  . CARDIAC CATHETERIZATION N/A 02/02/2016   Procedure: Coronary Stent Intervention;  Surgeon: Corky Crafts, MD;  Location: [facility] INVASIVE CV LAB;  Service: Cardiovascular;  Laterality: N/A;  . CARDIAC CATHETERIZATION N/A 02/02/2016   Procedure: IABP Insertion;  Surgeon: Corky Crafts, MD;  Location: [facility] INVASIVE CV LAB;  Service: Cardiovascular;  Laterality: N/A;  . CATARACTS REMOVED     BIL  . ROTATOR CUFF REPAIR  2011   L SHOULDER  . TOTAL KNEE ARTHROPLASTY Right 10/03/2013   Procedure: RIGHT TOTAL KNEE ARTHROPLASTY;  Surgeon: [name], MD;  Location: [facility] ORS;  Service: Orthopedics;  Laterality: Right;    Current Medications: Outpatient Medications Prior to Visit  Medication Sig Dispense Refill  . aspirin EC 81 MG tablet Take 81 mg by mouth daily with breakfast.     . atorvastatin (LIPITOR) 20 MG tablet Take 1 tablet (20 mg total) by mouth  daily. 30 tablet 11  . carvedilol (COREG) 6.25 MG tablet Take 1 tablet (6.25 mg total) by mouth 2 (two) times daily. (Patient taking differently: Take 3.125 mg by mouth 2 (two) times daily. ) 60 tablet 11  . furosemide (LASIX) 20 MG tablet Take 1 tablet by mouth daily and ok to use extra tablet as needed for swelling. (Patient taking differently: 40 mg. Take 1 tablet by mouth daily and ok to use extra tablet as needed for swelling.) 90 tablet 3  . pantoprazole (PROTONIX) 40 MG tablet Take 1 tablet (40 mg total) by mouth daily. (Patient taking differently: Take 40 mg by mouth daily as needed (acid reflux). ) 30 tablet 11  . tamsulosin (FLOMAX) 0.4 MG CAPS capsule TAKE 1 CAPSULE(0.4 MG) BY MOUTH DAILY AFTER SUPPER 90 capsule 3  . ticagrelor (BRILINTA) 90 MG TABS tablet Take 1 tablet (90 mg total) by mouth 2 (two) times daily. 60 tablet 11  . traMADol  (ULTRAM) 50 MG tablet Take 1 tablet (50 mg total) by mouth 3 (three) times daily as needed. 12 tablet 0   No facility-administered medications prior to visit.      Allergies:   Amiodarone and Morphine and related   Social History   Social History  . Marital status: Married    Spouse name: N/A  . Number of children: N/A  . Years of education: N/A   Social History Main Topics  . Smoking status: Never Smoker  . Smokeless tobacco: Never Used  . Alcohol use No  . Drug use: No  . Sexual activity: Not Asked   Other Topics Concern  . None   Social History Narrative  . None     Family History:  The patient's family history includes Cancer in his mother.   ROS:   Please see the history of present illness.    ROS All other systems reviewed and are negative.   PHYSICAL EXAM:   VS:  BP 107/65   Pulse 74   Ht 6\' 2"  (1.88 m)   Wt 216 lb (98 kg)   BMI 27.73 kg/m    GEN: Well nourished, well developed, in no acute distress  HEENT: normal  Neck: no JVD, carotid bruits, or masses Cardiac: RRR; no murmurs, rubs, or gallops,no edema  Respiratory:  Diminished breath sounds bilaterally, however no obvious rale, rhonchi or crackle GI: soft, nontender, + BS +distended MS: no deformity or atrophy  Skin: warm and dry, no rash Neuro:  Alert and Oriented x 3, Strength and sensation are intact Psych: euthymic mood, full affect  Wt Readings from Last 3 Encounters:  07/20/16 216 lb (98 kg)  07/12/16 220 lb (99.8 kg)  06/10/16 219 lb (99.3 kg)      Studies/Labs Reviewed:   EKG:  EKG is not ordered today.    Recent Labs: 02/24/2016: B Natriuretic Peptide 745.7 07/07/2016: ALT 16; BUN 31; Creatinine, Ser 1.37; Hemoglobin 12.0; Platelets 147; Potassium 4.4; Sodium 143   Lipid Panel    Component Value Date/Time   CHOL 131 02/02/2016 2318   TRIG 61 02/02/2016 2318   HDL 44 02/02/2016 2318   CHOLHDL 3.0 02/02/2016 2318   VLDL 12 02/02/2016 2318   LDLCALC 75 02/02/2016 2318     Additional studies/ records that were reviewed today include:   Cath 02/02/2016 Conclusion     Ost RCA lesion, 50 %stenosed. Mild pressure dampening with catheter engagement.  Ost LM lesion, 40 %stenosed.  Ost 1st Diag to 1st Diag lesion,  70 %stenosed.  LV end diastolic pressure is moderately elevated.  There is no aortic valve stenosis.  Mid LAD lesion, 10 %stenosed.  Ost 2nd Mrg to 2nd Mrg lesion, 100 %stenosed. A STENT SYNERGY DES 2.25X16 drug eluting stent was successfully placed.  Post intervention, there is a 0% residual stenosis.  Lat 2nd Mrg lesion, 80 %stenosed. The lesion is at the origin of a branch at the bifurcation distal to the stent.      Echo 05/14/2016 Study Conclusions  - Left ventricle: The cavity size was normal. Wall thickness was   normal. Systolic function was mildly reduced. The estimated   ejection fraction was in the range of 45% to 50%. There is   hypokinesis of the mid-apicalinferolateral and inferior   myocardium. Doppler parameters are consistent with abnormal left   ventricular relaxation (grade 1 diastolic dysfunction). - Aortic valve: There was mild regurgitation. - Aortic root: The aortic root was mildly dilated. - Ascending aorta: The ascending aorta was mildly dilated. - Mitral valve: There was mild regurgitation. - Pulmonary arteries: Systolic pressure was moderately increased.   PA peak pressure: 51 mm Hg (S).  Impressions:  - Hypokinesis of the distal inferior and inferolateral walls with   overall mildly reduced LV systolic function; grade 1 diastolic   dysfunction; sclerotic aortic valve with mild AI; mild MR; mild   TR with moderately elevated pulmonary pressure; mild dilated   aortic root (4.3 cm).    CT chest w contrast 07/08/2016 IMPRESSION: Small bilateral pleural effusions. Calcified and noncalcified pleural plaquing bilaterally consistent prior asbestos exposure. Minimal bronchiectasis.    Lumbar x  ray 07/12/2016 IMPRESSION: 1. Mild wedge compression deformity of the L3 vertebral body, compressed approximately 20-25% anteriorly, most likely chronic but new compared to previous exams from 2014. Cannot exclude acute on chronic compression fracture. No associated vertebral body displacement/retropulsion. 2. No acute appearing or suspicious osseous finding. 3. Degenerative changes throughout the thoracolumbar spine, most pronounced within the lower lumbar spine. If symptoms could be radiculopathic in nature, could consider nonemergent lumbar spine MRI to exclude associated nerve root impingement. 4. There is prominent hypertrophy of the spinous processes of the mid and lower lumbar spine, Baastrup's disease, another possible source for patient's back pain. 5. Aortic atherosclerosis.    ASSESSMENT:    1. DOE (dyspnea on exertion)   2. Coronary artery disease involving native coronary artery of native heart without angina pectoris   3. Chronic diastolic heart failure (HCC)   4. Acute midline low back pain without sciatica   5. Dilated aortic root (HCC)   6. Pericardial effusion   7. PAF (paroxysmal atrial fibrillation) (HCC)      PLAN:  In order of problems listed above:  1. DOE: Unclear cause, I ambulated the patient in the hallway twice, his O2 saturation maintain in the 90s, went down as low as 91% with ambulation. He says he is feeling the same way he felt prior to aggressive diuresis and the procedure last year. On physical exam, he does not appear to be volume overloaded. Heart rate in the 70s. In fact, he appears to be on the drier side. I will keep him on the current dose of diuretic for now. I recommended potentially physical therapy was he is seen by orthopedic doctor. I think some of this may be related to deconditioning after his recent fall.  2. CAD: He had STEMI with DES to OM 2 and in October 2017, no recent angina.  3. Chronic diastolic  heart failure: Appears to be  euvolemic on physical exam, currently taking 20 mg twice a day of Lasix. His creatinine has been trending up slightly, however stable.  4. Lower back pain: Had a recent fall on 07/12/2016, possible compression fracture noted on lumbar spine x-ray.  5. Dilated aortic root: 4.3 cm on echocardiogram February 2018  6. h/o pericardial effusion: Treated for pericardial effusion in the setting of severe acute on chronic combined systolic and diastolic heart failure and 40 pounds volume overload in November 2017. This has not recurred. Recent echocardiogram obtained in February did not show any pericardial fluid.  7. PAF: He did have an episode of new paroxysmal atrial fibrillation last year during the acute heart failure exacerbation episode in the hospital. He was not started on systemic anticoagulation at the time because he was having a large pericardial effusion as well, there was fear of hemorrhagic conversion. He has not had any recurrence of atrial fibrillation since.    Medication Adjustments/Labs and Tests Ordered: Current medicines are reviewed at length with the patient today.  Concerns regarding medicines are outlined above.  Medication changes, Labs and Tests ordered today are listed in the Patient Instructions below. Patient Instructions  Medication Instructions: no change   Labwork: none ordered   Testing/Procedures: none ordered   Follow-Up: 2 months w Dr. Tresa Endo   Any Other Special Instructions Will Be Listed Below (If Applicable).  Check with orthopedic physician to see if your are OK to do physical therapy.    If you need a refill on your cardiac medications before your next appointment, please call your pharmacy.      Ramond Dial, [place]  07/21/2016 1:37 PM    [facility] [address] [place], [place], [place]  [address] Phone: [phone]; Fax: [phone]

## 2016-07-20 NOTE — Patient Instructions (Signed)
Medication Instructions: no change   Labwork: none ordered   Testing/Procedures: none ordered   Follow-Up: 2 months w Dr. Tresa Endo   Any Other Special Instructions Will Be Listed Below (If Applicable).  Check with orthopedic physician to see if your are OK to do physical therapy.    If you need a refill on your cardiac medications before your next appointment, please call your pharmacy.

## 2016-07-21 ENCOUNTER — Encounter: Payer: Self-pay | Admitting: Physician Assistant

## 2016-07-27 ENCOUNTER — Telehealth: Payer: Self-pay | Admitting: Cardiovascular Disease

## 2016-07-27 ENCOUNTER — Ambulatory Visit: Payer: PPO | Admitting: Podiatry

## 2016-07-27 DIAGNOSIS — S32030A Wedge compression fracture of third lumbar vertebra, initial encounter for closed fracture: Secondary | ICD-10-CM | POA: Diagnosis not present

## 2016-07-27 NOTE — Telephone Encounter (Signed)
Attempt to return call to [place] at [employer] to call back.

## 2016-07-27 NOTE — Telephone Encounter (Signed)
New Message      Is it ok for this patient to have a MRI ?

## 2016-07-30 NOTE — Telephone Encounter (Signed)
Left detailed message on VM-what kind of MRI and what is it for? Surgery? What kind?

## 2016-07-30 NOTE — Telephone Encounter (Signed)
Spoke with [facility] they would like to do a lumbar MRI of L3 to see compression fracture

## 2016-08-04 ENCOUNTER — Telehealth: Payer: Self-pay | Admitting: Cardiovascular Disease

## 2016-08-04 NOTE — Telephone Encounter (Signed)
Okay per dr [place] to have MRI

## 2016-08-04 NOTE — Telephone Encounter (Signed)
New Message    Pt has stent getting a mri

## 2016-08-05 NOTE — Telephone Encounter (Signed)
Stent was placed in 01/2016; should be ok for MRI

## 2016-08-05 NOTE — Telephone Encounter (Signed)
Faxed via Epic to [phone]

## 2016-08-07 DIAGNOSIS — S32030D Wedge compression fracture of third lumbar vertebra, subsequent encounter for fracture with routine healing: Secondary | ICD-10-CM | POA: Diagnosis not present

## 2016-08-11 DIAGNOSIS — S32030D Wedge compression fracture of third lumbar vertebra, subsequent encounter for fracture with routine healing: Secondary | ICD-10-CM | POA: Diagnosis not present

## 2016-09-11 DIAGNOSIS — S32030A Wedge compression fracture of third lumbar vertebra, initial encounter for closed fracture: Secondary | ICD-10-CM | POA: Diagnosis not present

## 2016-09-11 DIAGNOSIS — S32030D Wedge compression fracture of third lumbar vertebra, subsequent encounter for fracture with routine healing: Secondary | ICD-10-CM | POA: Diagnosis not present

## 2016-09-16 ENCOUNTER — Telehealth: Payer: Self-pay | Admitting: Cardiovascular Disease

## 2016-09-16 NOTE — Telephone Encounter (Signed)
Received notes from [facility] for appointment on 09/21/16 with Dr Tresa Endo.  Records put with Dr Landry Dyke schedule for 09/21/16. lp

## 2016-09-21 ENCOUNTER — Encounter: Payer: Self-pay | Admitting: Cardiovascular Disease

## 2016-09-21 ENCOUNTER — Ambulatory Visit (INDEPENDENT_AMBULATORY_CARE_PROVIDER_SITE_OTHER): Payer: PPO | Admitting: Cardiovascular Disease

## 2016-09-21 VITALS — BP 110/68 | HR 62 | Ht 74.0 in | Wt 213.0 lb

## 2016-09-21 DIAGNOSIS — M8088XS Other osteoporosis with current pathological fracture, vertebra(e), sequela: Secondary | ICD-10-CM

## 2016-09-21 DIAGNOSIS — I252 Old myocardial infarction: Secondary | ICD-10-CM

## 2016-09-21 DIAGNOSIS — I2510 Atherosclerotic heart disease of native coronary artery without angina pectoris: Secondary | ICD-10-CM | POA: Diagnosis not present

## 2016-09-21 DIAGNOSIS — I480 Paroxysmal atrial fibrillation: Secondary | ICD-10-CM

## 2016-09-21 DIAGNOSIS — E785 Hyperlipidemia, unspecified: Secondary | ICD-10-CM

## 2016-09-21 DIAGNOSIS — M8008XS Age-related osteoporosis with current pathological fracture, vertebra(e), sequela: Secondary | ICD-10-CM

## 2016-09-21 DIAGNOSIS — R600 Localized edema: Secondary | ICD-10-CM | POA: Diagnosis not present

## 2016-09-21 DIAGNOSIS — I5042 Chronic combined systolic (congestive) and diastolic (congestive) heart failure: Secondary | ICD-10-CM

## 2016-09-21 DIAGNOSIS — Z79899 Other long term (current) drug therapy: Secondary | ICD-10-CM | POA: Diagnosis not present

## 2016-09-21 MED ORDER — LISINOPRIL 2.5 MG PO TABS: 2.5000 mg | ORAL_TABLET | Freq: Every day | ORAL | 3 refills | Status: DC

## 2016-09-21 MED ORDER — FUROSEMIDE 20 MG PO TABS: ORAL_TABLET | ORAL | 3 refills | Status: DC

## 2016-09-21 NOTE — Progress Notes (Signed)
Cardiology Office Note    Date:  09/23/2016   ID:  Danny Oneill, DOB 24-May-1931, MRN [id number]  PCP:  Burnard Bunting, MD  Cardiologist:  Shelva Majestic, MD   Chief Complaint  Patient presents with  . Follow-up        History of Present Illness:  Danny Oneill is a 83 y.o. male who Presents for 3 month follow-up cardiology evaluation.   [name] was admitted to [facility] hospital on 02/02/2016 with inferolateral STEMI.  Urgent cardiac catheterization showed 50% ostial RCA stenosis, 40% ostial left main stenosis, 70% ostial diagonal 1 stenosis, and total occlusion of the OM 2 vessel.  He underwent successful PCI with insertion of a 2.2516 mm DES stent into the circumflex marginal vessel.  This procedure was done by Dr. Irish Lack.  He was hypotensive in the lab.  Initially, a balloon pump was placed.  Except when echo Doppler study in 02/03/2016 showed an EF of 35-40% with akinesis of the inferior wall with moderate AR.  He was treated with Brilinta, aspirin, lisinopril, and atorvastatin.  He developed urinary retention and required transient Foley catheter placement and required urological consultation.  He has been seen office following his hospitalizations by [facility] and was noted have 3-4+ pedal edema.  A subsequent echo Doppler study showed an EF of 40-45% with inferior and inferoseptal hypokinesis, grade 1 diastolic dysfunction, PA pressure 31 mm, mild AR, and a moderate to large free-flowing pericardial effusion.  He was admitted to the hospital.  He was treated with IV diuresis.  A subsequent echo 1 week later showed improvement of LV function and reduction in his pericardial effusion, which was now small to moderate.  On 02/26/2016.  He developed AF with RVR and was started on amiodarone and actually converted to sinus rhythm.  When he last was seen by [facility], he was still complaining of shortness of breath.  When I saw him for initial office follow-up evaluation in January 2018 he was  feeling significantly improved. He he denied any chest pain or significant shortness of breath.  However, he continued to have tense lower extremity edema.  I recommended increasing his furosemide to 40 mg for several days and then alternating every other day with 20 mg.  I also further titrated carvedilol.  He underwent a follow-up echo Doppler study to reassess LV function and his previous pericardial effusion which was done on 05/14/2016.  Ejection fraction was 45-50%.  There was grade 1 diastolic dysfunction.  There was moderate pulmonary hypertension with a PA pressure at 51 mm.  There was mild aortic insufficiency and his aortic root was mildly dilated at 4.3 cm.  There was mild MR.   There was no mention of any pericardial effusion.   When I last saw him, I had recommended he increase his Lasix due to peripheral edema, and alternate 40 mg with 20 mg every other day.  He is now on Flomax with improvement in his urinary stream.  The past he had mild transaminase elevations which were improved on his most recent laboratory in April 2018.  He had undergone an orthopedic evaluation with Dr. Chriss Czar jaw, free, and has a closed wedge compression fracture of his third lumbar vertebrae.  Initial conservative management approach is underway although if vertebral plasty is to be considered Dr.Gioffre wanted my input concerning his antiplatelet therapy.  He presents for evaluation.  Past Medical History:  Diagnosis Date  . Anemia   . Aortic insufficiency 03/09/2016  a. mild-mod AI by echo 03/04/16.  . Arthritis   . Asbestosis ([place])    "no breathing problems" pt states worked in [employer] for many yrs and was exposed to asbestosis  . Bladder outlet obstruction 03/09/2016  . CAD (coronary artery disease)    a. 02/2016: inf lat STEMI with occluded OM2 which was treated with DES; otherwise he had moderate 50% oRCA, 40% oLM, 70% D1, 10% mLAD, 80% lateral 2nd marg which was treated medically.  . Cardiomyopathy,  ischemic   . Chronic combined systolic and diastolic CHF (congestive heart failure) (HCC)    a. EF 30-35% at time of STEMI 02/2016, improved to 40-45% by echo 11/20 (but with pericardial effusion). b. Further improved EF to 55-60% 03/04/16.  Marland Kitchen CKD (chronic kidney disease), stage III   . Coronary artery disease   . Dyspnea   . First degree AV block   . Hyperlipidemia   . Hyponatremia   . Lumbar spondylosis   . Myocardial infarction ([place])   . Orthostatic hypotension   . PAF (paroxysmal atrial fibrillation) ([place])    a. dx 02/2016: not anticoagulated due to pericardial effusion; amiodarone stopped due to suspected allergic drug rash.  . Pericardial effusion    a. 02/2016 - followed clinically (mod-large at first then small-mod in f/u echo).    Past Surgical History:  Procedure Laterality Date  . CARDIAC CATHETERIZATION N/A 02/02/2016   Procedure: Left Heart Cath and Coronary Angiography;  Surgeon: Danny Booze, MD;  Location: [place] [facility];  Service: Cardiovascular;  Laterality: N/A;  . CARDIAC CATHETERIZATION N/A 02/02/2016   Procedure: Coronary Stent Intervention;  Surgeon: Danny Booze, MD;  Location: [place] [facility];  Service: Cardiovascular;  Laterality: N/A;  . CARDIAC CATHETERIZATION N/A 02/02/2016   Procedure: IABP Insertion;  Surgeon: Danny Booze, MD;  Location: [place] [facility];  Service: Cardiovascular;  Laterality: N/A;  . CATARACTS REMOVED     BIL  . ROTATOR CUFF REPAIR  2011   L SHOULDER  . TOTAL KNEE ARTHROPLASTY Right 10/03/2013   Procedure: RIGHT TOTAL KNEE ARTHROPLASTY;  Surgeon: Mauri Pole, MD;  Location: [facility];  Service: Orthopedics;  Laterality: Right;    Current Medications: Outpatient Medications Prior to Visit  Medication Sig Dispense Refill  . aspirin EC 81 MG tablet Take 81 mg by mouth daily with breakfast.     . atorvastatin (LIPITOR) 20 MG tablet Take 1 tablet (20 mg total) by mouth daily. 30 tablet 11  . carvedilol  (COREG) 6.25 MG tablet Take 1 tablet (6.25 mg total) by mouth 2 (two) times daily. (Patient taking differently: Take 3.125 mg by mouth 2 (two) times daily. ) 60 tablet 11  . pantoprazole (PROTONIX) 40 MG tablet Take 1 tablet (40 mg total) by mouth daily. (Patient taking differently: Take 40 mg by mouth daily as needed (acid reflux). ) 30 tablet 11  . tamsulosin (FLOMAX) 0.4 MG CAPS capsule TAKE 1 CAPSULE(0.4 MG) BY MOUTH DAILY AFTER SUPPER 90 capsule 3  . ticagrelor (BRILINTA) 90 MG TABS tablet Take 1 tablet (90 mg total) by mouth 2 (two) times daily. 60 tablet 11  . furosemide (LASIX) 20 MG tablet Take 1 tablet by mouth daily and ok to use extra tablet as needed for swelling. (Patient taking differently: 40 mg. Take 1 tablet by mouth daily and ok to use extra tablet as needed for swelling.) 90 tablet 3   No facility-administered medications prior to visit.  Allergies:   Amiodarone and Morphine and related   Social History   Social History  . Marital status: Married    Spouse name: N/A  . Number of children: N/A  . Years of education: N/A   Social History Main Topics  . Smoking status: Never Smoker  . Smokeless tobacco: Never Used  . Alcohol use No  . Drug use: No  . Sexual activity: Not Asked   Other Topics Concern  . None   Social History Narrative  . None     Family History:  The patient's family history includes Cancer in his mother.   ROS General: Negative; No fevers, chills, or night sweats;  HEENT: Negative; No changes in vision or hearing, sinus congestion, difficulty swallowing Pulmonary: Negative; No cough, wheezing, shortness of breath, hemoptysis Cardiovascular: see HPI GI: Negative; No nausea, vomiting, diarrhea, or abdominal pain GU: Negative; No dysuria, hematuria, or difficulty voiding Musculoskeletal: Negative; no myalgias, joint pain, or weakness Hematologic/Oncology: Negative; no easy bruising, bleeding Endocrine: Negative; no heat/cold intolerance;  no diabetes Neuro: Negative; no changes in balance, headaches Skin: Negative; No rashes or skin lesions Psychiatric: Negative; No behavioral problems, depression Sleep: Negative; No snoring, daytime sleepiness, hypersomnolence, bruxism, restless legs, hypnogognic hallucinations, no cataplexy Other comprehensive 14 point system review is negative.   PHYSICAL EXAM:   VS:  BP 110/68   Pulse 62   Ht _0  (1.88 m)   Wt 213 lb (96.6 kg)   BMI 27.35 kg/m     Wt Readings from Last 3 Encounters:  09/21/16 213 lb (96.6 kg)  07/20/16 216 lb (98 kg)  07/12/16 220 lb (99.8 kg)      Physical Exam BP 110/68   Pulse 62   Ht _1  (1.88 m)   Wt 213 lb (96.6 kg)   BMI 27.35 kg/m  General: Alert, oriented, no distress.  Skin: normal turgor, no rashes, warm and dry HEENT: Normocephalic, atraumatic. Pupils equal round and reactive to light; sclera anicteric; extraocular muscles intact;  Nose without nasal septal hypertrophy Mouth/Parynx benign; Mallinpatti scale 3 Neck: No JVD, no carotid bruits; normal carotid upstroke Lungs: clear to ausculatation and percussion; no wheezing or rales Chest wall: without tenderness to palpitation Heart: PMI not displaced, RRR, s1 s2 normal, 1/6 systolic murmur, no diastolic murmur, no rubs, gallops, thrills, or heaves Abdomen: soft, nontender; no hepatosplenomehaly, BS+; abdominal aorta nontender and not dilated by palpation. Back: no CVA tenderness Pulses 2+ Musculoskeletal: full range of motion, normal strength, no joint deformities Extremities: Lower extremity edema, less tense than previously but still present;  no clubbing cyanosis,  Homan's sign negative  Neurologic: grossly nonfocal; Cranial nerves grossly wnl Psychologic: Normal mood and affect    Studies/Labs Reviewed:   ECG (independently read by me): Normal sinus rhythm at 62 bpm.  Borderline first degree AV block with a PR interval of 206 ms.  Nonspecific T-wave changes.  Low  voltage.  March 2018 ECG (independently read by me): Sinus rhythm at 67 bpm with first-degree AV block with a.  0210 ms.  ST changes anterolaterally.  January 2018 ECG (independently read by me): Normal sinus rhythm at 71 bpm.  T-wave inversion inferolaterally.  Wave in lead 3.  Mild first-degree AV block with a PR interval of 204 ms.  QTc interval 443 ms.  Recent Labs: BMP Latest Ref Rng & Units 07/07/2016 03/16/2016 03/07/2016  Glucose 65 - 99 mg/dL 125(H) 95 145(H)  BUN 6 - 20 mg/dL 31(H) 29(H) 27(H)  Creatinine  0.61 - 1.24 mg/dL 1.37(H) 1.25(H) 1.28(H)  Sodium 135 - 145 mmol/L 143 137 134(L)  Potassium 3.5 - 5.1 mmol/L 4.4 4.3 3.6  Chloride 101 - 111 mmol/L 107 98 92(L)  CO2 22 - 32 mmol/L 28 31 32  Calcium 8.9 - 10.3 mg/dL 9.2 8.3(L) 8.1(L)     Hepatic Function Latest Ref Rng & Units 07/07/2016 02/24/2016 02/17/2016  Total Protein 6.5 - 8.1 g/dL 6.7 5.7(L) -  Albumin 3.5 - 5.0 g/dL 3.7 3.0(L) -  AST 15 - 41 U/L 21 44(H) 42(A)  ALT 17 - 63 U/L 16(L) 56(H) 59(A)  Alk Phosphatase 38 - 126 U/L 55 50 68  Total Bilirubin 0.3 - 1.2 mg/dL 0.9 1.0 -  Bilirubin, Direct 0.1 - 0.5 mg/dL - - -    CBC Latest Ref Rng & Units 07/07/2016 03/07/2016 03/03/2016  WBC 4.0 - 10.5 K/uL 5.6 6.4 6.3  Hemoglobin 13.0 - 17.0 g/dL 12.0(L) 10.5(L) 10.4(L)  Hematocrit 39.0 - 52.0 % 37.5(L) 33.5(L) 32.4(L)  Platelets 150 - 400 K/uL 147(L) 231 216   Lab Results  Component Value Date   MCV 93.3 07/07/2016   MCV 93.1 03/07/2016   MCV 92.6 03/03/2016   No results found for: TSH Lab Results  Component Value Date   HGBA1C 5.9 (H) 02/02/2016     BNP    Component Value Date/Time   BNP 745.7 (H) 02/24/2016 1610   BNP 552.7 (H) 02/24/2016 1409    ProBNP No results found for: PROBNP   Lipid Panel     Component Value Date/Time   CHOL 131 02/02/2016 2318   TRIG 61 02/02/2016 2318   HDL 44 02/02/2016 2318   CHOLHDL 3.0 02/02/2016 2318   VLDL 12 02/02/2016 2318   LDLCALC 75 02/02/2016 2318     RADIOLOGY: No results found.   Additional studies/ records that were reviewed today include:  I reviewed the patient's hospitalizations, cardiac catheterization reports, echo Doppler studies.    ASSESSMENT:    1. 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 PAF (paroxysmal atrial fibrillation) ([place])   3. Coronary artery disease involving native coronary artery of native heart without angina pectoris   4. Chronic combined systolic and diastolic heart failure ([place])   5. Bilateral lower extremity edema   6. Hyperlipidemia with target LDL less than 70   7. Fracture of vertebra due to osteoporosis, sequela      PLAN:  Mr. Wyne is an 83 year old Caucasian male who suffered an inferolateral ST segment elevation myocardial infarction on 02/02/2016 and underwent successful stenting of a totally occluded OM 2 vessel.  He has concomitant CAD.  He initially was hypotensive and required balloon pump insertion.  Subsequently and had developed a pericardial effusion, atrial fibrillation, significant weight gain and lower extremity edema.  When I last saw him, he had significant lower extremity edema.  When I last saw him, he had tense lower extremity edema and I slightly titrated his furosemide.  He continues to have edema and I will further titrate Lasix to 40 mg daily.  I also would like to add very low dose ACE inhibition with his CAD and reduced LV function and I will initiate lisinopril 2.5 mg.  I will obtain a be met in 2-3 weeks with a CBC.  With reference to his vertebral fracture, I concur with initial conservative management.  He will will need to be on dual antiplatelet therapy for minimum of one-year following his 02/02/2016 acute coronary syndrome/MI.  As result, I  would not encourage that this be done and that he did not hold his Brilinta.  If surgery will be necessary.  This should be deferred until November or beyond.  He continues to be on atorvastatin for hyperlipidemia.  He  previously had elevation of liver function studies, which have normalized on his most recent laboratory.  His GERD is controlled.  His urinary stream is improved with Flomax.  I will see him in 3 months for reevaluation.   Medication Adjustments/Labs and Tests Ordered: Current medicines are reviewed at length with the patient today.  Concerns regarding medicines are outlined above.  Medication changes, Labs and Tests ordered today are listed in the Patient Instructions below.  Patient Instructions  Medication Instructions:   START LISINOPRIL 2.5 MG ONCE DAILY  INCREASE FUROSEMIDE TO 40 MG ONCE DAILY= 2 OF THE 20 MG TABLETS ONCE DAILY  Labwork:  Your physician recommends that you return for lab work in: [place]:  Your physician recommends that you schedule a follow-up appointment in: [place] DURING THE DAY AND REMOVE AT BEDTIME    Time spent: 30 minutes   Signed, Shelva Majestic, MD, [facility] 09/23/2016 10:44 PM    [facility] [address], [place], [place], [place]  [address] Phone: [phone]

## 2016-09-21 NOTE — Patient Instructions (Signed)
Medication Instructions:   START LISINOPRIL 2.5 MG ONCE DAILY  INCREASE FUROSEMIDE TO 40 MG ONCE DAILY= 2 OF THE 20 MG TABLETS ONCE DAILY  Labwork:  Your physician recommends that you return for lab work in: 3 WEEKS  Follow-Up:  Your physician recommends that you schedule a follow-up appointment in: 3 MONTHS WITH DR Tresa Endo   USE COMPRESSION HOSE DURING THE DAY AND REMOVE AT BEDTIME

## 2016-10-06 ENCOUNTER — Ambulatory Visit: Payer: PPO | Admitting: Physician Assistant

## 2016-10-14 ENCOUNTER — Emergency Department (HOSPITAL_BASED_OUTPATIENT_CLINIC_OR_DEPARTMENT_OTHER)
Admission: EM | Admit: 2016-10-14 | Discharge: 2016-10-14 | Disposition: A | Discharge status: Home / Self Care | Payer: PPO | Attending: Emergency Medicine | Admitting: Emergency Medicine

## 2016-10-14 ENCOUNTER — Encounter (HOSPITAL_BASED_OUTPATIENT_CLINIC_OR_DEPARTMENT_OTHER): Payer: Self-pay

## 2016-10-14 ENCOUNTER — Emergency Department (HOSPITAL_BASED_OUTPATIENT_CLINIC_OR_DEPARTMENT_OTHER): Payer: PPO

## 2016-10-14 DIAGNOSIS — Z96651 Presence of right artificial knee joint: Secondary | ICD-10-CM | POA: Insufficient documentation

## 2016-10-14 DIAGNOSIS — R55 Syncope and collapse: Secondary | ICD-10-CM

## 2016-10-14 DIAGNOSIS — E860 Dehydration: Secondary | ICD-10-CM

## 2016-10-14 DIAGNOSIS — I480 Paroxysmal atrial fibrillation: Secondary | ICD-10-CM

## 2016-10-14 DIAGNOSIS — I5042 Chronic combined systolic (congestive) and diastolic (congestive) heart failure: Secondary | ICD-10-CM | POA: Insufficient documentation

## 2016-10-14 DIAGNOSIS — I2510 Atherosclerotic heart disease of native coronary artery without angina pectoris: Secondary | ICD-10-CM | POA: Insufficient documentation

## 2016-10-14 DIAGNOSIS — N183 Chronic kidney disease, stage 3 (moderate): Secondary | ICD-10-CM | POA: Insufficient documentation

## 2016-10-14 DIAGNOSIS — Z79899 Other long term (current) drug therapy: Secondary | ICD-10-CM | POA: Diagnosis not present

## 2016-10-14 DIAGNOSIS — I951 Orthostatic hypotension: Secondary | ICD-10-CM | POA: Insufficient documentation

## 2016-10-14 DIAGNOSIS — Z7982 Long term (current) use of aspirin: Secondary | ICD-10-CM | POA: Insufficient documentation

## 2016-10-14 LAB — CBC
HCT: 39.7 % (ref 39.0–52.0)
Hemoglobin: 13.1 g/dL (ref 13.0–17.0)
MCH: 31.2 pg (ref 26.0–34.0)
MCHC: 33 g/dL (ref 30.0–36.0)
MCV: 94.5 fL (ref 78.0–100.0)
Platelets: 149 K/uL — ABNORMAL LOW (ref 150–400)
RBC: 4.2 MIL/uL — ABNORMAL LOW (ref 4.22–5.81)
RDW: 16.8 % — ABNORMAL HIGH (ref 11.5–15.5)
WBC: 5.6 K/uL (ref 4.0–10.5)

## 2016-10-14 LAB — COMPREHENSIVE METABOLIC PANEL WITH GFR
ALT: 26 U/L (ref 17–63)
AST: 28 U/L (ref 15–41)
Albumin: 4 g/dL (ref 3.5–5.0)
Alkaline Phosphatase: 69 U/L (ref 38–126)
Anion gap: 9 (ref 5–15)
BUN: 45 mg/dL — ABNORMAL HIGH (ref 6–20)
CO2: 30 mmol/L (ref 22–32)
Calcium: 9.2 mg/dL (ref 8.9–10.3)
Chloride: 104 mmol/L (ref 101–111)
Creatinine, Ser: 1.56 mg/dL — ABNORMAL HIGH (ref 0.61–1.24)
GFR calc Af Amer: 46 mL/min — ABNORMAL LOW
GFR calc non Af Amer: 39 mL/min — ABNORMAL LOW
Glucose, Bld: 98 mg/dL (ref 65–99)
Potassium: 4.9 mmol/L (ref 3.5–5.1)
Sodium: 143 mmol/L (ref 135–145)
Total Bilirubin: 1.3 mg/dL — ABNORMAL HIGH (ref 0.3–1.2)
Total Protein: 7.7 g/dL (ref 6.5–8.1)

## 2016-10-14 LAB — BRAIN NATRIURETIC PEPTIDE: B Natriuretic Peptide: 474.8 pg/mL — ABNORMAL HIGH (ref 0.0–100.0)

## 2016-10-14 LAB — TROPONIN I: Troponin I: <0.03 ng/mL

## 2016-10-14 MED ORDER — FUROSEMIDE 20 MG PO TABS: ORAL_TABLET | ORAL | 3 refills | Status: AC

## 2016-10-14 MED ORDER — SODIUM CHLORIDE 0.9 % IV BOLUS (SEPSIS)
250.0000 mL | Freq: Once | INTRAVENOUS | Status: AC
  Administered 2016-10-14: 250 mL via INTRAVENOUS

## 2016-10-14 NOTE — Discharge Instructions (Signed)
We will be modifying your furosemide (Lasix). Please take one 20 mg tablet once a day. Please call Dr. Tresa Endo for close follow-up regarding your orthostasis due to diuresing.

## 2016-10-14 NOTE — ED Notes (Signed)
ED Provider at bedside discussing dispo plan of care. 

## 2016-10-14 NOTE — ED Notes (Signed)
Patient transported to CT 

## 2016-10-14 NOTE — ED Provider Notes (Signed)
[facility] Provider Note   CSN: [id number] Arrival date & time: 10/14/16  1430     History   Chief Complaint Chief Complaint  Patient presents with  . Loss of Consciousness    HPI Danny Oneill is a 83 y.o. male.  The history is provided by the patient and a relative.  Loss of Consciousness   This is a new problem. The current episode started 1 to 2 hours ago. Episode frequency: once. The problem has been resolved. Length of episode of loss of consciousness: unknown. The problem is associated with standing up. Pertinent negatives include bladder incontinence, bowel incontinence, chest pain, confusion, diaphoresis, dizziness, headaches, light-headedness, malaise/fatigue, nausea, palpitations, visual change, vomiting and weakness. He has tried nothing for the symptoms. Past medical history comments: CHF (EF 45%) on lasix; AR.   Patient reports a change in his Lasix recently. He reports that Dr. Tresa Endo, his cardiologist increase his Lasix from 20 mg daily to 40 mg daily approximately one week ago.  He reports known history of orthostasis however he has never had a syncopal episode due to it.   Past Medical History:  Diagnosis Date  . Anemia   . Aortic insufficiency 03/09/2016   a. mild-mod AI by echo 03/04/16.  . Arthritis   . Asbestosis (HCC)    "no breathing problems" pt states worked in [employer] for many yrs and was exposed to asbestosis  . Bladder outlet obstruction 03/09/2016  . CAD (coronary artery disease)    a. 02/2016: inf lat STEMI with occluded OM2 which was treated with DES; otherwise he had moderate 50% oRCA, 40% oLM, 70% D1, 10% mLAD, 80% lateral 2nd marg which was treated medically.  . Cardiomyopathy, ischemic   . Chronic combined systolic and diastolic CHF (congestive heart failure) (HCC)    a. EF 30-35% at time of STEMI 02/2016, improved to 40-45% by echo 11/20 (but with pericardial effusion). b. Further improved EF to 55-60% 03/04/16.  Marland Kitchen CKD  (chronic kidney disease), stage III   . Coronary artery disease   . Dyspnea   . First degree AV block   . Hyperlipidemia   . Hyponatremia   . Lumbar spondylosis   . Myocardial infarction (HCC)   . Orthostatic hypotension   . PAF (paroxysmal atrial fibrillation) (HCC)    a. dx 02/2016: not anticoagulated due to pericardial effusion; amiodarone stopped due to suspected allergic drug rash.  . Pericardial effusion    a. 02/2016 - followed clinically (mod-large at first then small-mod in f/u echo).    Patient Active Problem List   Diagnosis Date Noted  . Allergic drug rash 03/09/2016  . Orthostatic hypotension 03/09/2016  . Hyponatremia 03/09/2016  . Hypokalemia 03/09/2016  . Bladder outlet obstruction 03/09/2016  . Urinary retention 03/09/2016  . First degree AV block 03/09/2016  . Aortic insufficiency 03/09/2016  . CKD (chronic kidney disease), stage III 03/09/2016  . Anemia   . Pericardial effusion 02/27/2016  . PAF (paroxysmal atrial fibrillation) (HCC) 02/27/2016  . CAD (coronary artery disease) 02/27/2016  . Cardiomyopathy, ischemic 02/27/2016  . Acute on chronic combined systolic and diastolic CHF (congestive heart failure) (HCC) 02/24/2016  . Systolic and diastolic CHF, acute (HCC) 02/13/2016  . Anterior and lateral ST segment elevation (HCC) 02/13/2016  . ARF (acute renal failure) (HCC) 02/13/2016  . Acute urinary retention 02/13/2016  . Hematuria 02/13/2016  . Elevated liver enzymes 02/13/2016  . Bilateral lower extremity edema 02/13/2016  . Low BP 02/13/2016  . Hyperlipidemia 02/13/2016  .  Acute ST elevation myocardial infarction (STEMI) (HCC)   . Acute inferolateral myocardial infarction (HCC) 02/02/2016  . Acute MI, inferolateral wall, initial episode of care (HCC)   . Arterial hypotension   . Overweight (BMI 25.0-29.9) 10/04/2013  . Expected blood loss anemia 10/04/2013  . S/P right TKA 10/03/2013    Past Surgical History:  Procedure Laterality Date  .  CARDIAC CATHETERIZATION N/A 02/02/2016   Procedure: Left Heart Cath and Coronary Angiography;  Surgeon: Corky Crafts, MD;  Location: [name] INVASIVE CV LAB;  Service: Cardiovascular;  Laterality: N/A;  . CARDIAC CATHETERIZATION N/A 02/02/2016   Procedure: Coronary Stent Intervention;  Surgeon: Corky Crafts, MD;  Location: [facility] INVASIVE CV LAB;  Service: Cardiovascular;  Laterality: N/A;  . CARDIAC CATHETERIZATION N/A 02/02/2016   Procedure: IABP Insertion;  Surgeon: Corky Crafts, MD;  Location: [facility] INVASIVE CV LAB;  Service: Cardiovascular;  Laterality: N/A;  . CATARACTS REMOVED     BIL  . ROTATOR CUFF REPAIR  2011   L SHOULDER  . TOTAL KNEE ARTHROPLASTY Right 10/03/2013   Procedure: RIGHT TOTAL KNEE ARTHROPLASTY;  Surgeon: [name], MD;  Location: [facility] ORS;  Service: Orthopedics;  Laterality: Right;       Home Medications    Prior to Admission medications   Medication Sig Start Date End Date Taking? Authorizing Provider  aspirin EC 81 MG tablet Take 81 mg by mouth daily with breakfast.     [provider]  atorvastatin (LIPITOR) 20 MG tablet Take 1 tablet (20 mg total) by mouth daily. 04/07/16   Lennette Bihari, MD  carvedilol (COREG) 6.25 MG tablet Take 1 tablet (6.25 mg total) by mouth 2 (two) times daily. Patient taking differently: Take 3.125 mg by mouth 2 (two) times daily.  04/28/16   Lennette Bihari, MD  diphenhydramine-acetaminophen (TYLENOL PM) 25-500 MG TABS tablet Take 1 tablet by mouth at bedtime as needed.    [provider]  furosemide (LASIX) 20 MG tablet Take 1 tablet by mouth daily and ok to use extra tablet as needed for swelling. 10/14/16   Mathius Birkeland, Amadeo Garnet, MD  lisinopril (PRINIVIL,ZESTRIL) 2.5 MG tablet Take 1 tablet (2.5 mg total) by mouth daily. 09/21/16 12/20/16  Lennette Bihari, MD  pantoprazole (PROTONIX) 40 MG tablet Take 1 tablet (40 mg total) by mouth daily. Patient taking differently: Take 40 mg by mouth daily as needed  (acid reflux).  04/07/16   Lennette Bihari, MD  tamsulosin (FLOMAX) 0.4 MG CAPS capsule TAKE 1 CAPSULE(0.4 MG) BY MOUTH DAILY AFTER SUPPER 04/01/16   Dunn, Tacey Ruiz, PA-C  ticagrelor (BRILINTA) 90 MG TABS tablet Take 1 tablet (90 mg total) by mouth 2 (two) times daily. 04/07/16   Lennette Bihari, MD    Family History Family History  Problem Relation Age of Onset  . Cancer Mother     Social History Social History  Substance Use Topics  . Smoking status: Never Smoker  . Smokeless tobacco: Never Used  . Alcohol use No     Allergies   Amiodarone and Morphine and related   Review of Systems Review of Systems  Constitutional: Negative for diaphoresis and malaise/fatigue.  Cardiovascular: Positive for syncope. Negative for chest pain and palpitations.  Gastrointestinal: Negative for bowel incontinence, nausea and vomiting.  Genitourinary: Negative for bladder incontinence.  Neurological: Negative for dizziness, weakness, light-headedness and headaches.  Psychiatric/Behavioral: Negative for confusion.   All other systems are reviewed and are negative for acute change except as  noted in the HPI   Physical Exam Updated Vital Signs BP 110/60 (BP Location: Left Arm)   Pulse 68   Temp (!) 97.4 F (36.3 C) (Oral)   Resp 18   Ht 6\' 2"  (1.88 m)   Wt 90.7 kg (200 lb)   SpO2 100%   BMI 25.68 kg/m   Physical Exam  Constitutional: He is oriented to person, place, and time. He appears well-developed and well-nourished. No distress.  HENT:  Head: Normocephalic and atraumatic.  Right Ear: External ear normal.  Left Ear: External ear normal.  Nose: Nose normal.  Mouth/Throat: Oropharynx is clear and moist.  Eyes: Conjunctivae and EOM are normal. Pupils are equal, round, and reactive to light. Right eye exhibits no discharge. Left eye exhibits no discharge. No scleral icterus.  Neck: Normal range of motion. Neck supple.  Cardiovascular: Normal rate, regular rhythm and normal heart  sounds.  Exam reveals no gallop and no friction rub.   No murmur heard. Pulses:      Radial pulses are 2+ on the right side, and 2+ on the left side.       Dorsalis pedis pulses are 2+ on the right side, and 2+ on the left side.  Pulmonary/Chest: Effort normal and breath sounds normal. No stridor. No respiratory distress. He has no rales.  Abdominal: Soft. He exhibits no distension. There is no tenderness.  Musculoskeletal: He exhibits no edema or tenderness.       Right wrist: He exhibits normal range of motion, no tenderness, no bony tenderness, no swelling and no deformity.       Cervical back: He exhibits no bony tenderness.       Thoracic back: He exhibits no bony tenderness.       Lumbar back: He exhibits no bony tenderness.       Right forearm: He exhibits no tenderness, no bony tenderness, no swelling and no deformity.  Clavicle stable. Chest stable to AP/Lat compression. Pelvis stable to Lat compression. No obvious extremity deformity. No chest or abdominal wall contusion.  Neurological: He is alert and oriented to person, place, and time. GCS eye subscore is 4. GCS verbal subscore is 5. GCS motor subscore is 6.  Moving all extremities   Skin: Skin is warm and dry. No rash noted. He is not diaphoretic. No erythema.  Multiple skin tears to the right forearm and hand.  Psychiatric: He has a normal mood and affect.  Vitals reviewed.    ED Treatments / Results  Labs (all labs ordered are listed, but only abnormal results are displayed) Labs Reviewed  CBC - Abnormal; Notable for the following:       Result Value   RBC 4.20 (*)    RDW 16.8 (*)    Platelets 149 (*)    All other components within normal limits  COMPREHENSIVE METABOLIC PANEL - Abnormal; Notable for the following:    BUN 45 (*)    Creatinine, Ser 1.56 (*)    Total Bilirubin 1.3 (*)    GFR calc non Af Amer 39 (*)    GFR calc Af Amer 46 (*)    All other components within normal limits  BRAIN NATRIURETIC  PEPTIDE - Abnormal; Notable for the following:    B Natriuretic Peptide 474.8 (*)    All other components within normal limits  TROPONIN I    EKG  EKG Interpretation  Date/Time:  Wednesday October 14 2016 16:20:40 EDT Ventricular Rate:  69 PR Interval:  QRS Duration: 101 QT Interval:  400 QTC Calculation: 429 R Axis:   62 Text Interpretation:  Sinus rhythm Short PR interval Low voltage, extremity and precordial leads No significant change since last tracing Confirmed by Drema Pry [phone]) on 10/14/2016 5:16:44 PM       Radiology Ct Head Wo Contrast  Result Date: 10/14/2016 CLINICAL DATA:  Syncope EXAM: CT HEAD WITHOUT CONTRAST TECHNIQUE: Contiguous axial images were obtained from the base of the skull through the vertex without intravenous contrast. COMPARISON:  None. FINDINGS: Brain: There is global atrophy appropriate to age. Mild chronic ischemic changes in the periventricular white matter. There is no mass effect, midline shift, or acute hemorrhage. Vascular: No hyperdense vessel or unexpected calcification. Skull: The cranium is intact. Sinuses/Orbits: Mastoid air cells are clear. There is partial fluid opacification of the anterior ethmoid air cells. Other: Noncontributory IMPRESSION: No acute intracranial pathology. There is fluid in the anterior ethmoid air cells. An inflammatory process is not excluded. Electronically Signed   By: Jolaine Click M.D.   On: 10/14/2016 16:27    Procedures Procedures (including critical care time)  Medications Ordered in ED Medications  sodium chloride 0.9 % bolus 250 mL (0 mLs Intravenous Stopped 10/14/16 1836)     Initial Impression / Assessment and Plan / ED Course  I have reviewed the triage vital signs and the nursing notes.  Pertinent labs & imaging results that were available during my care of the patient were reviewed by me and considered in my medical decision making (see chart for details).     Presentation consistent with  orthostasis. Labs with mild increase in renal insufficiency. Likely secondary to dehydration from over diuresing. EKG without acute ischemic changes or dysrhythmias. Troponin negative.  Exam with mild peripheral edema but otherwise no evidence of volume overload. Low suspicion for cardiac etiology. Case discussed with Dr. Herbie Baltimore, who agreed with given the patient's small fluid bolus and is improved, reduce Lasix from 40 mg daily back down to 20 mg daily and have patient follow-up with Dr. Tresa Endo.  CT head without evidence of acute intracranial hemorrhage. Skin tears were irrigated and bandaged. Note for primary closure.  Patient given 250 mL of IV fluid. Significant improvement and orthostatics.  Feel he is safe for discharge with strict return precautions.  Final Clinical Impressions(s) / ED Diagnoses   Final diagnoses:  Syncope and collapse  Dehydration  Orthostasis   Disposition: Discharge  Condition: Good  I have discussed the results, Dx and Tx plan with the patient and daugther who expressed understanding and agree(s) with the plan. Discharge instructions discussed at great length. The patient and daughter were given strict return precautions who verbalized understanding of the instructions. No further questions at time of discharge.    Discharge Medication List as of 10/14/2016  8:17 PM      Follow Up: Lennette Bihari, MD [address] [place] [address] [phone]  Schedule an appointment as soon as possible for a visit  For close follow up to assess for dehydration from diuresing      Ilithyia Titzer, Amadeo Garnet, MD 10/14/16 2344

## 2016-10-14 NOTE — ED Triage Notes (Signed)
Pt states he stood after lunch and "blacked out"-fell to floor-pain to right UE-denies had or neck injury/pain-presents to triage in w/c

## 2016-10-14 NOTE — ED Notes (Signed)
Cleansed pt skin tears to right elbow, wrist, and top of hand with SafeClens, applied mepitel, bacitracin, and wrapped in coban.

## 2016-10-19 ENCOUNTER — Telehealth: Payer: Self-pay | Admitting: Cardiovascular Disease

## 2016-10-19 DIAGNOSIS — Z79899 Other long term (current) drug therapy: Secondary | ICD-10-CM

## 2016-10-19 NOTE — Telephone Encounter (Signed)
Stop lisinopril, agree with ED physician's recommendation of decreasing lasix to 20mg  daily instead. Recheck BMET in 1 week to reassess renal function and electrolyte

## 2016-10-19 NOTE — Telephone Encounter (Signed)
Pt of Dr. Tresa Endo. Recently in office on 6/18. Prior visit on 4/16 w Hao.  ED eval on 7/11.  Spoke to daughter, Enid Derry, regarding patient's fall/syncope. This happened in the home as he stood up from a seated position at the dinner table.  She notes concern for low BPs, states he was recently started on lisinopril in office.  Reconciled med list.  Pt was running about 100/68 the other day in ER after fall.  Orthos were checked, per daughter - he was 112/70 when lying, then lower on sitting and lower on standing - she did not have these numbers.  She reports the patient doesn't currently check his BP at home out of concerns that "it's not accurate".  She was concerned about dehydration, this was noted at ER visit, so didn't know if either lasix or lisinopril should be stopped or reduced.  Aware I will defer to provider for decision about any med changes. Dr. Tresa Endo out of office - Hao's review on this requested by caller as he saw patient in April.

## 2016-10-19 NOTE — Telephone Encounter (Signed)
New message     Patient had fall last week per daughter and the emergency room said to contact you , to advise about medication making him dizzy ?  She is not sure what medication it is , either the Lasix , or bp medication , bp was 100/68 when he passed out last week .

## 2016-10-20 NOTE — Telephone Encounter (Signed)
Valla Leaver (daughter) notified she will log weights and come in for lab~10-28-16

## 2016-11-04 DIAGNOSIS — S72009A Fracture of unspecified part of neck of unspecified femur, initial encounter for closed fracture: Secondary | ICD-10-CM

## 2016-11-04 HISTORY — DX: Fracture of unspecified part of neck of unspecified femur, initial encounter for closed fracture: S72.009A

## 2016-11-16 DIAGNOSIS — E784 Other hyperlipidemia: Secondary | ICD-10-CM | POA: Diagnosis not present

## 2016-11-16 DIAGNOSIS — I252 Old myocardial infarction: Secondary | ICD-10-CM | POA: Diagnosis not present

## 2016-11-16 DIAGNOSIS — I700 Atherosclerosis of aorta: Secondary | ICD-10-CM | POA: Diagnosis not present

## 2016-11-16 DIAGNOSIS — M1990 Unspecified osteoarthritis, unspecified site: Secondary | ICD-10-CM | POA: Diagnosis not present

## 2016-11-16 DIAGNOSIS — Z6825 Body mass index (BMI) 25.0-25.9, adult: Secondary | ICD-10-CM | POA: Diagnosis not present

## 2016-11-16 DIAGNOSIS — I5021 Acute systolic (congestive) heart failure: Secondary | ICD-10-CM | POA: Diagnosis not present

## 2016-11-16 DIAGNOSIS — Z1389 Encounter for screening for other disorder: Secondary | ICD-10-CM | POA: Diagnosis not present

## 2016-11-16 DIAGNOSIS — R413 Other amnesia: Secondary | ICD-10-CM | POA: Diagnosis not present

## 2016-12-03 ENCOUNTER — Emergency Department (HOSPITAL_COMMUNITY): Payer: PPO

## 2016-12-03 ENCOUNTER — Inpatient Hospital Stay (HOSPITAL_COMMUNITY)
Admission: EM | Admit: 2016-12-03 | Discharge: 2017-01-15 | DRG: 480 | Disposition: A | Discharge status: Skilled Nursing Facility | Payer: PPO | Attending: Family Medicine | Admitting: Family Medicine

## 2016-12-03 DIAGNOSIS — R1084 Generalized abdominal pain: Secondary | ICD-10-CM | POA: Diagnosis not present

## 2016-12-03 DIAGNOSIS — N170 Acute kidney failure with tubular necrosis: Secondary | ICD-10-CM | POA: Diagnosis not present

## 2016-12-03 DIAGNOSIS — S61512A Laceration without foreign body of left wrist, initial encounter: Secondary | ICD-10-CM | POA: Diagnosis present

## 2016-12-03 DIAGNOSIS — Z4689 Encounter for fitting and adjustment of other specified devices: Secondary | ICD-10-CM | POA: Diagnosis not present

## 2016-12-03 DIAGNOSIS — S7222XA Displaced subtrochanteric fracture of left femur, initial encounter for closed fracture: Principal | ICD-10-CM | POA: Diagnosis present

## 2016-12-03 DIAGNOSIS — E7800 Pure hypercholesterolemia, unspecified: Secondary | ICD-10-CM | POA: Diagnosis not present

## 2016-12-03 DIAGNOSIS — I480 Paroxysmal atrial fibrillation: Secondary | ICD-10-CM | POA: Diagnosis present

## 2016-12-03 DIAGNOSIS — E440 Moderate protein-calorie malnutrition: Secondary | ICD-10-CM | POA: Diagnosis present

## 2016-12-03 DIAGNOSIS — I255 Ischemic cardiomyopathy: Secondary | ICD-10-CM | POA: Diagnosis not present

## 2016-12-03 DIAGNOSIS — J988 Other specified respiratory disorders: Secondary | ICD-10-CM | POA: Diagnosis not present

## 2016-12-03 DIAGNOSIS — Z4789 Encounter for other orthopedic aftercare: Secondary | ICD-10-CM | POA: Diagnosis not present

## 2016-12-03 DIAGNOSIS — R9431 Abnormal electrocardiogram [ECG] [EKG]: Secondary | ICD-10-CM | POA: Diagnosis not present

## 2016-12-03 DIAGNOSIS — R52 Pain, unspecified: Secondary | ICD-10-CM

## 2016-12-03 DIAGNOSIS — K819 Cholecystitis, unspecified: Secondary | ICD-10-CM | POA: Diagnosis not present

## 2016-12-03 DIAGNOSIS — I252 Old myocardial infarction: Secondary | ICD-10-CM | POA: Diagnosis not present

## 2016-12-03 DIAGNOSIS — S72012A Unspecified intracapsular fracture of left femur, initial encounter for closed fracture: Secondary | ICD-10-CM | POA: Diagnosis not present

## 2016-12-03 DIAGNOSIS — R739 Hyperglycemia, unspecified: Secondary | ICD-10-CM | POA: Diagnosis not present

## 2016-12-03 DIAGNOSIS — E872 Acidosis: Secondary | ICD-10-CM | POA: Diagnosis not present

## 2016-12-03 DIAGNOSIS — E870 Hyperosmolality and hypernatremia: Secondary | ICD-10-CM | POA: Diagnosis not present

## 2016-12-03 DIAGNOSIS — R748 Abnormal levels of other serum enzymes: Secondary | ICD-10-CM

## 2016-12-03 DIAGNOSIS — R7401 Elevation of levels of liver transaminase levels: Secondary | ICD-10-CM

## 2016-12-03 DIAGNOSIS — J189 Pneumonia, unspecified organism: Secondary | ICD-10-CM | POA: Diagnosis not present

## 2016-12-03 DIAGNOSIS — I351 Nonrheumatic aortic (valve) insufficiency: Secondary | ICD-10-CM | POA: Diagnosis not present

## 2016-12-03 DIAGNOSIS — I493 Ventricular premature depolarization: Secondary | ICD-10-CM | POA: Diagnosis not present

## 2016-12-03 DIAGNOSIS — D689 Coagulation defect, unspecified: Secondary | ICD-10-CM | POA: Diagnosis not present

## 2016-12-03 DIAGNOSIS — Z955 Presence of coronary angioplasty implant and graft: Secondary | ICD-10-CM

## 2016-12-03 DIAGNOSIS — G9341 Metabolic encephalopathy: Secondary | ICD-10-CM | POA: Diagnosis not present

## 2016-12-03 DIAGNOSIS — R2689 Other abnormalities of gait and mobility: Secondary | ICD-10-CM | POA: Diagnosis not present

## 2016-12-03 DIAGNOSIS — I2510 Atherosclerotic heart disease of native coronary artery without angina pectoris: Secondary | ICD-10-CM | POA: Diagnosis not present

## 2016-12-03 DIAGNOSIS — Z79899 Other long term (current) drug therapy: Secondary | ICD-10-CM

## 2016-12-03 DIAGNOSIS — S72142A Displaced intertrochanteric fracture of left femur, initial encounter for closed fracture: Secondary | ICD-10-CM | POA: Diagnosis not present

## 2016-12-03 DIAGNOSIS — R571 Hypovolemic shock: Secondary | ICD-10-CM | POA: Diagnosis not present

## 2016-12-03 DIAGNOSIS — I5042 Chronic combined systolic (congestive) and diastolic (congestive) heart failure: Secondary | ICD-10-CM | POA: Diagnosis not present

## 2016-12-03 DIAGNOSIS — Z9841 Cataract extraction status, right eye: Secondary | ICD-10-CM

## 2016-12-03 DIAGNOSIS — I272 Pulmonary hypertension, unspecified: Secondary | ICD-10-CM | POA: Diagnosis present

## 2016-12-03 DIAGNOSIS — I130 Hypertensive heart and chronic kidney disease with heart failure and stage 1 through stage 4 chronic kidney disease, or unspecified chronic kidney disease: Secondary | ICD-10-CM | POA: Diagnosis not present

## 2016-12-03 DIAGNOSIS — W19XXXA Unspecified fall, initial encounter: Secondary | ICD-10-CM

## 2016-12-03 DIAGNOSIS — Z66 Do not resuscitate: Secondary | ICD-10-CM | POA: Diagnosis present

## 2016-12-03 DIAGNOSIS — N184 Chronic kidney disease, stage 4 (severe): Secondary | ICD-10-CM | POA: Diagnosis not present

## 2016-12-03 DIAGNOSIS — A0472 Enterocolitis due to Clostridium difficile, not specified as recurrent: Secondary | ICD-10-CM | POA: Diagnosis not present

## 2016-12-03 DIAGNOSIS — Z978 Presence of other specified devices: Secondary | ICD-10-CM

## 2016-12-03 DIAGNOSIS — S72002D Fracture of unspecified part of neck of left femur, subsequent encounter for closed fracture with routine healing: Secondary | ICD-10-CM | POA: Diagnosis not present

## 2016-12-03 DIAGNOSIS — E2740 Unspecified adrenocortical insufficiency: Secondary | ICD-10-CM | POA: Diagnosis present

## 2016-12-03 DIAGNOSIS — M25552 Pain in left hip: Secondary | ICD-10-CM | POA: Diagnosis present

## 2016-12-03 DIAGNOSIS — D62 Acute posthemorrhagic anemia: Secondary | ICD-10-CM | POA: Diagnosis not present

## 2016-12-03 DIAGNOSIS — Z8781 Personal history of (healed) traumatic fracture: Secondary | ICD-10-CM | POA: Diagnosis not present

## 2016-12-03 DIAGNOSIS — N390 Urinary tract infection, site not specified: Secondary | ICD-10-CM | POA: Diagnosis not present

## 2016-12-03 DIAGNOSIS — R17 Unspecified jaundice: Secondary | ICD-10-CM | POA: Diagnosis not present

## 2016-12-03 DIAGNOSIS — R6521 Severe sepsis with septic shock: Secondary | ICD-10-CM | POA: Diagnosis not present

## 2016-12-03 DIAGNOSIS — R109 Unspecified abdominal pain: Secondary | ICD-10-CM | POA: Diagnosis not present

## 2016-12-03 DIAGNOSIS — J9690 Respiratory failure, unspecified, unspecified whether with hypoxia or hypercapnia: Secondary | ICD-10-CM | POA: Diagnosis not present

## 2016-12-03 DIAGNOSIS — S51012A Laceration without foreign body of left elbow, initial encounter: Secondary | ICD-10-CM | POA: Diagnosis present

## 2016-12-03 DIAGNOSIS — I5043 Acute on chronic combined systolic (congestive) and diastolic (congestive) heart failure: Secondary | ICD-10-CM | POA: Diagnosis present

## 2016-12-03 DIAGNOSIS — D649 Anemia, unspecified: Secondary | ICD-10-CM | POA: Diagnosis not present

## 2016-12-03 DIAGNOSIS — N401 Enlarged prostate with lower urinary tract symptoms: Secondary | ICD-10-CM | POA: Diagnosis present

## 2016-12-03 DIAGNOSIS — R0902 Hypoxemia: Secondary | ICD-10-CM | POA: Diagnosis not present

## 2016-12-03 DIAGNOSIS — J9601 Acute respiratory failure with hypoxia: Secondary | ICD-10-CM | POA: Diagnosis not present

## 2016-12-03 DIAGNOSIS — H9190 Unspecified hearing loss, unspecified ear: Secondary | ICD-10-CM | POA: Diagnosis present

## 2016-12-03 DIAGNOSIS — R778 Other specified abnormalities of plasma proteins: Secondary | ICD-10-CM

## 2016-12-03 DIAGNOSIS — R7989 Other specified abnormal findings of blood chemistry: Secondary | ICD-10-CM

## 2016-12-03 DIAGNOSIS — D72819 Decreased white blood cell count, unspecified: Secondary | ICD-10-CM | POA: Diagnosis not present

## 2016-12-03 DIAGNOSIS — N179 Acute kidney failure, unspecified: Secondary | ICD-10-CM | POA: Diagnosis not present

## 2016-12-03 DIAGNOSIS — K810 Acute cholecystitis: Secondary | ICD-10-CM | POA: Diagnosis not present

## 2016-12-03 DIAGNOSIS — R601 Generalized edema: Secondary | ICD-10-CM | POA: Diagnosis not present

## 2016-12-03 DIAGNOSIS — J61 Pneumoconiosis due to asbestos and other mineral fibers: Secondary | ICD-10-CM | POA: Diagnosis not present

## 2016-12-03 DIAGNOSIS — R627 Adult failure to thrive: Secondary | ICD-10-CM | POA: Diagnosis not present

## 2016-12-03 DIAGNOSIS — R740 Nonspecific elevation of levels of transaminase and lactic acid dehydrogenase [LDH]: Secondary | ICD-10-CM | POA: Diagnosis not present

## 2016-12-03 DIAGNOSIS — W07XXXA Fall from chair, initial encounter: Secondary | ICD-10-CM | POA: Diagnosis present

## 2016-12-03 DIAGNOSIS — S73005A Unspecified dislocation of left hip, initial encounter: Secondary | ICD-10-CM | POA: Diagnosis not present

## 2016-12-03 DIAGNOSIS — K7290 Hepatic failure, unspecified without coma: Secondary | ICD-10-CM | POA: Diagnosis not present

## 2016-12-03 DIAGNOSIS — Z419 Encounter for procedure for purposes other than remedying health state, unspecified: Secondary | ICD-10-CM

## 2016-12-03 DIAGNOSIS — E869 Volume depletion, unspecified: Secondary | ICD-10-CM | POA: Diagnosis not present

## 2016-12-03 DIAGNOSIS — D696 Thrombocytopenia, unspecified: Secondary | ICD-10-CM | POA: Diagnosis not present

## 2016-12-03 DIAGNOSIS — K839 Disease of biliary tract, unspecified: Secondary | ICD-10-CM | POA: Diagnosis not present

## 2016-12-03 DIAGNOSIS — J9811 Atelectasis: Secondary | ICD-10-CM | POA: Diagnosis not present

## 2016-12-03 DIAGNOSIS — A419 Sepsis, unspecified organism: Secondary | ICD-10-CM | POA: Diagnosis not present

## 2016-12-03 DIAGNOSIS — E876 Hypokalemia: Secondary | ICD-10-CM | POA: Diagnosis not present

## 2016-12-03 DIAGNOSIS — B370 Candidal stomatitis: Secondary | ICD-10-CM | POA: Diagnosis not present

## 2016-12-03 DIAGNOSIS — D6959 Other secondary thrombocytopenia: Secondary | ICD-10-CM | POA: Diagnosis present

## 2016-12-03 DIAGNOSIS — Z515 Encounter for palliative care: Secondary | ICD-10-CM | POA: Diagnosis not present

## 2016-12-03 DIAGNOSIS — F05 Delirium due to known physiological condition: Secondary | ICD-10-CM | POA: Diagnosis not present

## 2016-12-03 DIAGNOSIS — N183 Chronic kidney disease, stage 3 unspecified: Secondary | ICD-10-CM | POA: Diagnosis present

## 2016-12-03 DIAGNOSIS — Z9842 Cataract extraction status, left eye: Secondary | ICD-10-CM

## 2016-12-03 DIAGNOSIS — Z77090 Contact with and (suspected) exposure to asbestos: Secondary | ICD-10-CM | POA: Diagnosis present

## 2016-12-03 DIAGNOSIS — E669 Obesity, unspecified: Secondary | ICD-10-CM | POA: Diagnosis present

## 2016-12-03 DIAGNOSIS — A4151 Sepsis due to Escherichia coli [E. coli]: Secondary | ICD-10-CM | POA: Diagnosis not present

## 2016-12-03 DIAGNOSIS — L8990 Pressure ulcer of unspecified site, unspecified stage: Secondary | ICD-10-CM | POA: Insufficient documentation

## 2016-12-03 DIAGNOSIS — I5022 Chronic systolic (congestive) heart failure: Secondary | ICD-10-CM | POA: Diagnosis not present

## 2016-12-03 DIAGNOSIS — E162 Hypoglycemia, unspecified: Secondary | ICD-10-CM | POA: Diagnosis not present

## 2016-12-03 DIAGNOSIS — R945 Abnormal results of liver function studies: Secondary | ICD-10-CM

## 2016-12-03 DIAGNOSIS — E875 Hyperkalemia: Secondary | ICD-10-CM | POA: Diagnosis not present

## 2016-12-03 DIAGNOSIS — K7200 Acute and subacute hepatic failure without coma: Secondary | ICD-10-CM | POA: Diagnosis not present

## 2016-12-03 DIAGNOSIS — R338 Other retention of urine: Secondary | ICD-10-CM | POA: Diagnosis not present

## 2016-12-03 DIAGNOSIS — Z6828 Body mass index (BMI) 28.0-28.9, adult: Secondary | ICD-10-CM

## 2016-12-03 DIAGNOSIS — R609 Edema, unspecified: Secondary | ICD-10-CM | POA: Diagnosis not present

## 2016-12-03 DIAGNOSIS — Z7189 Other specified counseling: Secondary | ICD-10-CM | POA: Diagnosis not present

## 2016-12-03 DIAGNOSIS — R4020 Unspecified coma: Secondary | ICD-10-CM | POA: Diagnosis not present

## 2016-12-03 DIAGNOSIS — S72002A Fracture of unspecified part of neck of left femur, initial encounter for closed fracture: Secondary | ICD-10-CM | POA: Diagnosis present

## 2016-12-03 DIAGNOSIS — N12 Tubulo-interstitial nephritis, not specified as acute or chronic: Secondary | ICD-10-CM | POA: Diagnosis not present

## 2016-12-03 DIAGNOSIS — Z96651 Presence of right artificial knee joint: Secondary | ICD-10-CM | POA: Diagnosis present

## 2016-12-03 DIAGNOSIS — M6281 Muscle weakness (generalized): Secondary | ICD-10-CM | POA: Diagnosis not present

## 2016-12-03 DIAGNOSIS — R933 Abnormal findings on diagnostic imaging of other parts of digestive tract: Secondary | ICD-10-CM | POA: Diagnosis not present

## 2016-12-03 DIAGNOSIS — W010XXA Fall on same level from slipping, tripping and stumbling without subsequent striking against object, initial encounter: Secondary | ICD-10-CM | POA: Diagnosis not present

## 2016-12-03 DIAGNOSIS — I959 Hypotension, unspecified: Secondary | ICD-10-CM | POA: Diagnosis not present

## 2016-12-03 DIAGNOSIS — Z01818 Encounter for other preprocedural examination: Secondary | ICD-10-CM | POA: Diagnosis not present

## 2016-12-03 DIAGNOSIS — J90 Pleural effusion, not elsewhere classified: Secondary | ICD-10-CM | POA: Diagnosis not present

## 2016-12-03 DIAGNOSIS — Z7902 Long term (current) use of antithrombotics/antiplatelets: Secondary | ICD-10-CM

## 2016-12-03 DIAGNOSIS — Z781 Physical restraint status: Secondary | ICD-10-CM

## 2016-12-03 DIAGNOSIS — I9589 Other hypotension: Secondary | ICD-10-CM | POA: Diagnosis not present

## 2016-12-03 DIAGNOSIS — J9600 Acute respiratory failure, unspecified whether with hypoxia or hypercapnia: Secondary | ICD-10-CM | POA: Diagnosis not present

## 2016-12-03 DIAGNOSIS — S79911A Unspecified injury of right hip, initial encounter: Secondary | ICD-10-CM | POA: Diagnosis not present

## 2016-12-03 DIAGNOSIS — S72102A Unspecified trochanteric fracture of left femur, initial encounter for closed fracture: Secondary | ICD-10-CM | POA: Diagnosis not present

## 2016-12-03 DIAGNOSIS — K831 Obstruction of bile duct: Secondary | ICD-10-CM | POA: Diagnosis not present

## 2016-12-03 DIAGNOSIS — S72009A Fracture of unspecified part of neck of unspecified femur, initial encounter for closed fracture: Secondary | ICD-10-CM | POA: Diagnosis present

## 2016-12-03 DIAGNOSIS — I2583 Coronary atherosclerosis due to lipid rich plaque: Secondary | ICD-10-CM | POA: Diagnosis not present

## 2016-12-03 DIAGNOSIS — R1312 Dysphagia, oropharyngeal phase: Secondary | ICD-10-CM | POA: Diagnosis not present

## 2016-12-03 DIAGNOSIS — R278 Other lack of coordination: Secondary | ICD-10-CM | POA: Diagnosis not present

## 2016-12-03 DIAGNOSIS — D638 Anemia in other chronic diseases classified elsewhere: Secondary | ICD-10-CM | POA: Diagnosis present

## 2016-12-03 DIAGNOSIS — S92153A Displaced avulsion fracture (chip fracture) of unspecified talus, initial encounter for closed fracture: Secondary | ICD-10-CM | POA: Diagnosis not present

## 2016-12-03 DIAGNOSIS — E785 Hyperlipidemia, unspecified: Secondary | ICD-10-CM | POA: Diagnosis present

## 2016-12-03 DIAGNOSIS — I9581 Postprocedural hypotension: Secondary | ICD-10-CM | POA: Diagnosis not present

## 2016-12-03 DIAGNOSIS — R1310 Dysphagia, unspecified: Secondary | ICD-10-CM | POA: Diagnosis not present

## 2016-12-03 DIAGNOSIS — Z4803 Encounter for change or removal of drains: Secondary | ICD-10-CM | POA: Diagnosis not present

## 2016-12-03 DIAGNOSIS — Z7982 Long term (current) use of aspirin: Secondary | ICD-10-CM

## 2016-12-03 DIAGNOSIS — T148XXA Other injury of unspecified body region, initial encounter: Secondary | ICD-10-CM | POA: Diagnosis not present

## 2016-12-03 DIAGNOSIS — T819XXA Unspecified complication of procedure, initial encounter: Secondary | ICD-10-CM | POA: Diagnosis not present

## 2016-12-03 HISTORY — DX: Fracture of unspecified part of neck of unspecified femur, initial encounter for closed fracture: S72.009A

## 2016-12-03 LAB — BASIC METABOLIC PANEL
Anion gap: 9 (ref 5–15)
BUN: 28 mg/dL — ABNORMAL HIGH (ref 6–20)
CALCIUM: 9 mg/dL (ref 8.9–10.3)
CO2: 26 mmol/L (ref 22–32)
CREATININE: 1.29 mg/dL — AB (ref 0.61–1.24)
Chloride: 105 mmol/L (ref 101–111)
GFR calc non Af Amer: 50 mL/min — ABNORMAL LOW (ref >60)
GFR, EST AFRICAN AMERICAN: 57 mL/min — AB (ref >60)
Glucose, Bld: 104 mg/dL — ABNORMAL HIGH (ref 65–99)
Potassium: 4.7 mmol/L (ref 3.5–5.1)
Sodium: 140 mmol/L (ref 135–145)

## 2016-12-03 LAB — CBC
HCT: 33.5 % — ABNORMAL LOW (ref 39.0–52.0)
Hemoglobin: 10.9 g/dL — ABNORMAL LOW (ref 13.0–17.0)
MCH: 30.7 pg (ref 26.0–34.0)
MCHC: 32.5 g/dL (ref 30.0–36.0)
MCV: 94.4 fL (ref 78.0–100.0)
PLATELETS: 134 10*3/uL — AB (ref 150–400)
RBC: 3.55 MIL/uL — AB (ref 4.22–5.81)
RDW: 16.6 % — ABNORMAL HIGH (ref 11.5–15.5)
WBC: 4.5 10*3/uL (ref 4.0–10.5)

## 2016-12-03 MED ORDER — ACETAMINOPHEN 325 MG PO TABS
650.0000 mg | ORAL_TABLET | Freq: Four times a day (QID) | ORAL | Status: DC | PRN
  Administered 2016-12-04 – 2016-12-07 (×10): 650 mg via ORAL
  Filled 2016-12-03 (×10): qty 2

## 2016-12-03 MED ORDER — ONDANSETRON HCL 4 MG PO TABS: 4.0000 mg | ORAL_TABLET | Freq: Four times a day (QID) | ORAL | Status: DC | PRN

## 2016-12-03 MED ORDER — ONDANSETRON HCL 4 MG/2ML IJ SOLN
4.0000 mg | Freq: Four times a day (QID) | INTRAMUSCULAR | Status: DC | PRN
Start: 2016-12-03 — End: 2016-12-08
  Administered 2016-12-08: 4 mg via INTRAVENOUS

## 2016-12-03 MED ORDER — FENTANYL CITRATE (PF) 100 MCG/2ML IJ SOLN
100.0000 ug | Freq: Once | INTRAMUSCULAR | Status: AC
  Administered 2016-12-03: 100 ug via INTRAVENOUS
  Filled 2016-12-03: qty 2

## 2016-12-03 MED ORDER — HYDROMORPHONE HCL 1 MG/ML IJ SOLN
0.5000 mg | Freq: Once | INTRAMUSCULAR | Status: AC
  Administered 2016-12-03: 0.5 mg via INTRAVENOUS
  Filled 2016-12-03: qty 1

## 2016-12-03 MED ORDER — DOCUSATE SODIUM 100 MG PO CAPS
100.0000 mg | ORAL_CAPSULE | Freq: Two times a day (BID) | ORAL | Status: DC
  Administered 2016-12-03 – 2016-12-07 (×8): 100 mg via ORAL
  Filled 2016-12-03 (×8): qty 1

## 2016-12-03 MED ORDER — CEFAZOLIN SODIUM-DEXTROSE 2-4 GM/100ML-% IV SOLN
2.0000 g | INTRAVENOUS | Status: AC
  Filled 2016-12-03: qty 100

## 2016-12-03 MED ORDER — ACETAMINOPHEN 650 MG RE SUPP: 650.0000 mg | Freq: Four times a day (QID) | RECTAL | Status: DC | PRN

## 2016-12-03 MED ORDER — BACITRACIN ZINC 500 UNIT/GM EX OINT
1.0000 "application " | TOPICAL_OINTMENT | Freq: Two times a day (BID) | CUTANEOUS | Status: DC
  Administered 2016-12-03 – 2016-12-23 (×31): 1 via TOPICAL
  Filled 2016-12-03 (×25): qty 28.35
  Filled 2016-12-03: qty 0.9
  Filled 2016-12-03: qty 28.35

## 2016-12-03 MED ORDER — FENTANYL CITRATE (PF) 100 MCG/2ML IJ SOLN
50.0000 ug | INTRAMUSCULAR | Status: DC | PRN
  Administered 2016-12-03 – 2016-12-07 (×9): 50 ug via INTRAVENOUS
  Filled 2016-12-03 (×11): qty 2

## 2016-12-03 MED ORDER — SODIUM CHLORIDE 0.9% FLUSH
3.0000 mL | Freq: Two times a day (BID) | INTRAVENOUS | Status: DC
  Administered 2016-12-03 – 2016-12-09 (×10): 3 mL via INTRAVENOUS
  Administered 2016-12-09: 10 mL via INTRAVENOUS

## 2016-12-03 NOTE — H&P (Signed)
[facility] History and Physical  Danny Oneill ZOX:[id number] DOB: 02-18-1932 DOA: 12/03/2016  Referring physician:  PCP: Geoffry Paradise, MD   Chief Complaint: "I fell at lunch."  HPI: Danny Oneill is a 83 y.o. male  with past medical history significant for bladder outlet obstruction, CAD, CKD who presents with hip pain. Patient has been well. Taking medications as prescribed. At lunch chair slid from underneath him and patient hit the floor. Instant pain. EMS activated. Patient with physical findings of hip fracture. Transported to the emergency room for further evaluation.   ED course: Patient brought to the emergency room. Pain control with fentanyl. X-ray confirmed hip fracture. Orthopedics consult by EDP. Hospitalist consulted for admission.  Review of Systems:  As per HPI otherwise 10 point review of systems negative.    Past Medical History:  Diagnosis Date  . Anemia   . Aortic insufficiency 03/09/2016   a. mild-mod AI by echo 03/04/16.  . Arthritis   . Asbestosis (HCC)    "no breathing problems" pt states worked in [employer] for many yrs and was exposed to asbestosis  . Bladder outlet obstruction 03/09/2016  . CAD (coronary artery disease)    a. 02/2016: inf lat STEMI with occluded OM2 which was treated with DES; otherwise he had moderate 50% oRCA, 40% oLM, 70% D1, 10% mLAD, 80% lateral 2nd marg which was treated medically.  . Cardiomyopathy, ischemic   . Chronic combined systolic and diastolic CHF (congestive heart failure) (HCC)    a. EF 30-35% at time of STEMI 02/2016, improved to 40-45% by echo 11/20 (but with pericardial effusion). b. Further improved EF to 55-60% 03/04/16.  Marland Kitchen CKD (chronic kidney disease), stage III   . Coronary artery disease   . Dyspnea   . First degree AV block   . Hyperlipidemia   . Hyponatremia   . Lumbar spondylosis   . Myocardial infarction (HCC)   . Orthostatic hypotension   . PAF (paroxysmal atrial fibrillation) (HCC)    a.  dx 02/2016: not anticoagulated due to pericardial effusion; amiodarone stopped due to suspected allergic drug rash.  . Pericardial effusion    a. 02/2016 - followed clinically (mod-large at first then small-mod in f/u echo).   Past Surgical History:  Procedure Laterality Date  . CARDIAC CATHETERIZATION N/A 02/02/2016   Procedure: Left Heart Cath and Coronary Angiography;  Surgeon: Corky Crafts, MD;  Location[facility] INVASIVE CV LAB;  Service: Cardiovascular;  Laterality: N/A;  . CARDIAC CATHETERIZATION N/A 02/02/2016   Procedure: Coronary Stent Intervention;  Surgeon: Corky Crafts, MD;  Location: [facility] INVASIVE CV LAB;  Service: Cardiovascular;  Laterality: N/A;  . CARDIAC CATHETERIZATION N/A 02/02/2016   Procedure: IABP Insertion;  Surgeon: Corky Crafts, MD;  Location: [facility] INVASIVE CV LAB;  Service: Cardiovascular;  Laterality: N/A;  . CATARACTS REMOVED     BIL  . ROTATOR CUFF REPAIR  2011   L SHOULDER  . TOTAL KNEE ARTHROPLASTY Right 10/03/2013   Procedure: RIGHT TOTAL KNEE ARTHROPLASTY;  Surgeon: [name], MD;  Location: [facility] ORS;  Service: Orthopedics;  Laterality: Right;   Social History:  reports that he has never smoked. He has never used smokeless tobacco. He reports that he does not drink alcohol or use drugs.  Allergies  Allergen Reactions  . Amiodarone Rash  . Morphine And Related Other (See Comments)    Confusion/ hallucinations     Family History  Problem Relation Age of Onset  . Cancer Mother  Prior to Admission medications   Medication Sig Start Date End Date Taking? Authorizing Provider  aspirin EC 81 MG tablet Take 81 mg by mouth daily with breakfast.     [provider]  atorvastatin (LIPITOR) 20 MG tablet Take 1 tablet (20 mg total) by mouth daily. 04/07/16   Lennette Bihari, MD  carvedilol (COREG) 6.25 MG tablet Take 1 tablet (6.25 mg total) by mouth 2 (two) times daily. Patient taking differently: Take 3.125 mg by mouth 2 (two)  times daily.  04/28/16   Lennette Bihari, MD  furosemide (LASIX) 20 MG tablet Take 1 tablet by mouth daily and ok to use extra tablet as needed for swelling. 10/14/16   Cardama, Amadeo Garnet, MD  lisinopril (PRINIVIL,ZESTRIL) 2.5 MG tablet Take 1 tablet (2.5 mg total) by mouth daily. 09/21/16 12/20/16  Lennette Bihari, MD  tamsulosin (FLOMAX) 0.4 MG CAPS capsule TAKE 1 CAPSULE(0.4 MG) BY MOUTH DAILY AFTER SUPPER 04/01/16   Dunn, Tacey Ruiz, PA-C  ticagrelor (BRILINTA) 90 MG TABS tablet Take 1 tablet (90 mg total) by mouth 2 (two) times daily. 04/07/16   Lennette Bihari, MD   Physical Exam: Vitals:   12/03/16 1554  BP: 126/79  Pulse: 62  Resp: 16  SpO2: 96%    Wt Readings from Last 3 Encounters:  10/14/16 90.7 kg (200 lb)  09/21/16 96.6 kg (213 lb)  07/20/16 98 kg (216 lb)    General:  Appears calm and comfortable; A&Ox3 Eyes:  PERRL, EOMI, normal lids, iris ENT:  grossly normal hearing, lips & tongue Neck:  no LAD, masses or thyromegaly Cardiovascular:  RRR, no m/r/g. No LE edema.  Respiratory:  CTA bilaterally, no w/r/r. Normal respiratory effort. Abdomen:  soft, ntnd Skin:  no rash or induration seen on limited exam Musculoskeletal:  grossly normal tone BUE, rotated LUE Psychiatric:  grossly normal mood and affect, speech fluent and appropriate Neurologic:  CN 2-12 grossly intact, moves all extremities in coordinated fashion.          Labs on Admission:  Basic Metabolic Panel:  Recent Labs Lab 12/03/16 1410  NA 140  K 4.7  CL 105  CO2 26  GLUCOSE 104*  BUN 28*  CREATININE 1.29*  CALCIUM 9.0   Liver Function Tests: No results for input(s): AST, ALT, ALKPHOS, BILITOT, PROT, ALBUMIN in the last 168 hours. No results for input(s): LIPASE, AMYLASE in the last 168 hours. No results for input(s): AMMONIA in the last 168 hours. CBC:  Recent Labs Lab 12/03/16 1410  WBC 4.5  HGB 10.9*  HCT 33.5*  MCV 94.4  PLT 134*   Cardiac Enzymes: No results for input(s): CKTOTAL,  CKMB, CKMBINDEX, TROPONINI in the last 168 hours.  BNP (last 3 results)  Recent Labs  02/24/16 1409 02/24/16 1610 10/14/16 1600  BNP 552.7* 745.7* 474.8*    ProBNP (last 3 results) No results for input(s): PROBNP in the last 8760 hours.   Creatinine clearance cannot be calculated (Unknown ideal weight.)  CBG: No results for input(s): GLUCAP in the last 168 hours.  Radiological Exams on Admission: Dg Chest 1 View  Result Date: 12/03/2016 CLINICAL DATA:  83 year old male status post fall. EXAM: CHEST 1 VIEW COMPARISON:  CT 07/08/2016 FINDINGS: The heart size and mediastinal contours are enlarged. There is bibasilar atelectasis without focal opacity, sizable effusion or pneumothorax. The visualized skeletal structures are unremarkable. IMPRESSION: Cardiomegaly and bibasilar atelectasis without focal opacity. Electronically Signed   By: Rhona Raider.D.  On: 12/03/2016 15:35   Dg Hip Unilat With Pelvis 2-3 Views Left  Result Date: 12/03/2016 CLINICAL DATA:  83 year old male with left hip pain status post fall. EXAM: DG HIP (WITH OR WITHOUT PELVIS) 2-3V LEFT COMPARISON:  None. FINDINGS: There is a comminuted subtrochanteric hip fracture on the left with associated angulation and displacement of the lesser trochanter. The right hip is intact. Visualized bony pelvis is intact. Soft tissues grossly unremarkable. IMPRESSION: Comminuted subtrochanteric hip fracture with angulation and displacement of multiple fracture fragments. Electronically Signed   By: Sande Brothers M.D.   On: 12/03/2016 15:34    EKG: pending  Assessment/Plan Principal Problem:   Closed left hip fracture (HCC) Active Problems:   Hyperlipidemia   CKD (chronic kidney disease), stage III   Hip fracture (HCC)   Hip fracture EDP consulted ortho Revised cardiac risk index-2 points-class III risk, patient's risk of major cardiac event is 6.6% Awaiting EKG.  CKD III Monitor Cr daily Cr at baseline,   1.2-1.5  CHF Cont coreg bud, lisinopril qd  CAD Asa qd, Ticagrelor bid  HLD Cont statin  BPH Cotn flomax  Anemia Range 10.5 - 13.1 At baseline  No signs of bleed  Code Status: full  DVT Prophylaxis: scd Family Communication: wife at bedside Disposition Plan: Pending Improvement  Status: inpt med surg  Haydee Salter, MD Family Medicine [facility] [email or web address] Password [email or web address]

## 2016-12-03 NOTE — Consult Note (Signed)
Reason for Consult:Fracture of Left Hip Referring Physician: DR.Little  Danny Oneill is an 83 y.o. male.  HPI: Patient fell while at a restaurant today and sustained a Fracture of his left Hip.  Past Medical History:  Diagnosis Date  . Anemia   . Aortic insufficiency 03/09/2016   a. mild-mod AI by echo 03/04/16.  . Arthritis   . Asbestosis ([place])    "no breathing problems" pt states worked in [employer] for many yrs and was exposed to asbestosis  . Bladder outlet obstruction 03/09/2016  . CAD (coronary artery disease)    a. 02/2016: inf lat STEMI with occluded OM2 which was treated with DES; otherwise he had moderate 50% oRCA, 40% oLM, 70% D1, 10% mLAD, 80% lateral 2nd marg which was treated medically.  . Cardiomyopathy, ischemic   . Chronic combined systolic and diastolic CHF (congestive heart failure) (HCC)    a. EF 30-35% at time of STEMI 02/2016, improved to 40-45% by echo 11/20 (but with pericardial effusion). b. Further improved EF to 55-60% 03/04/16.  Marland Kitchen CKD (chronic kidney disease), stage III   . Coronary artery disease   . Dyspnea   . First degree AV block   . Hyperlipidemia   . Hyponatremia   . Lumbar spondylosis   . Myocardial infarction ([place])   . Orthostatic hypotension   . PAF (paroxysmal atrial fibrillation) ([place])    a. dx 02/2016: not anticoagulated due to pericardial effusion; amiodarone stopped due to suspected allergic drug rash.  . Pericardial effusion    a. 02/2016 - followed clinically (mod-large at first then small-mod in f/u echo).    Past Surgical History:  Procedure Laterality Date  . CARDIAC CATHETERIZATION N/A 02/02/2016   Procedure: Left Heart Cath and Coronary Angiography;  Surgeon: Jettie Booze, MD;  Location: [place] [facility];  Service: Cardiovascular;  Laterality: N/A;  . CARDIAC CATHETERIZATION N/A 02/02/2016   Procedure: Coronary Stent Intervention;  Surgeon: Jettie Booze, MD;  Location: [place] [facility];  Service:  Cardiovascular;  Laterality: N/A;  . CARDIAC CATHETERIZATION N/A 02/02/2016   Procedure: IABP Insertion;  Surgeon: Jettie Booze, MD;  Location: [place] [facility];  Service: Cardiovascular;  Laterality: N/A;  . CATARACTS REMOVED     BIL  . ROTATOR CUFF REPAIR  2011   L SHOULDER  . TOTAL KNEE ARTHROPLASTY Right 10/03/2013   Procedure: RIGHT TOTAL KNEE ARTHROPLASTY;  Surgeon: Mauri Pole, MD;  Location: [facility];  Service: Orthopedics;  Laterality: Right;    Family History  Problem Relation Age of Onset  . Cancer Mother     Social History:  reports that he has never smoked. He has never used smokeless tobacco. He reports that he does not drink alcohol or use drugs.  Allergies:  Allergies  Allergen Reactions  . Amiodarone Rash  . Morphine And Related Other (See Comments)    Confusion/ hallucinations     Medications: I have reviewed the patient's current medications.  Results for orders placed or performed during the hospital encounter of 12/03/16 (from the past 48 hour(s))  Basic metabolic panel     Status: Abnormal   Collection Time: 12/03/16  2:10 PM  Result Value Ref Range   Sodium 140 135 - 145 mmol/L   Potassium 4.7 3.5 - 5.1 mmol/L   Chloride 105 101 - 111 mmol/L   CO2 26 22 - 32 mmol/L   Glucose, Bld 104 (H) 65 - 99 mg/dL   BUN 28 (H) 6 - 20  mg/dL   Creatinine, Ser 1.29 (H) 0.61 - 1.24 mg/dL   Calcium 9.0 8.9 - 10.3 mg/dL   GFR calc non Af Amer 50 (L) >60 mL/min   GFR calc Af Amer 57 (L) >60 mL/min    Comment: (NOTE) The eGFR has been calculated using the CKD EPI equation. This calculation has not been validated in all clinical situations. eGFR's persistently <60 mL/min signify possible Chronic Kidney Disease.    Anion gap 9 5 - 15  CBC     Status: Abnormal   Collection Time: 12/03/16  2:10 PM  Result Value Ref Range   WBC 4.5 4.0 - 10.5 K/uL   RBC 3.55 (L) 4.22 - 5.81 MIL/uL   Hemoglobin 10.9 (L) 13.0 - 17.0 g/dL   HCT 33.5 (L) 39.0 - 52.0 %   MCV  94.4 78.0 - 100.0 fL   MCH 30.7 26.0 - 34.0 pg   MCHC 32.5 30.0 - 36.0 g/dL   RDW 16.6 (H) 11.5 - 15.5 %   Platelets 134 (L) 150 - 400 K/uL    Dg Chest 1 View  Result Date: 12/03/2016 CLINICAL DATA:  83 year old male status post fall. EXAM: CHEST 1 VIEW COMPARISON:  CT 07/08/2016 FINDINGS: The heart size and mediastinal contours are enlarged. There is bibasilar atelectasis without focal opacity, sizable effusion or pneumothorax. The visualized skeletal structures are unremarkable. IMPRESSION: Cardiomegaly and bibasilar atelectasis without focal opacity. Electronically Signed   By: Kristopher Oppenheim M.D.   On: 12/03/2016 15:35   Dg Hip Unilat With Pelvis 2-3 Views Left  Result Date: 12/03/2016 CLINICAL DATA:  83 year old male with left hip pain status post fall. EXAM: DG HIP (WITH OR WITHOUT PELVIS) 2-3V LEFT COMPARISON:  None. FINDINGS: There is a comminuted subtrochanteric hip fracture on the left with associated angulation and displacement of the lesser trochanter. The right hip is intact. Visualized bony pelvis is intact. Soft tissues grossly unremarkable. IMPRESSION: Comminuted subtrochanteric hip fracture with angulation and displacement of multiple fracture fragments. Electronically Signed   By: Kristopher Oppenheim M.D.   On: 12/03/2016 15:34    Review of Systems  HENT: Negative.   Eyes: Negative.   Respiratory: Negative.   Cardiovascular: Positive for leg swelling.  Gastrointestinal: Negative.   Genitourinary: Negative.   Musculoskeletal: Positive for falls and joint pain.  Skin: Positive for rash.  Neurological: Positive for focal weakness and weakness.  Endo/Heme/Allergies: Negative.   Psychiatric/Behavioral: Negative.    Blood pressure 126/79, pulse 62, resp. rate 16, SpO2 96 %. Physical Exam  Constitutional: He appears distressed.  HENT:  Head: Normocephalic.  Eyes: Pupils are equal, round, and reactive to light.  Neck: Normal range of motion.  Cardiovascular: Normal rate.    Respiratory: Effort normal.  GI: Soft.  Musculoskeletal:  Deformity of Left Lower extremity.  Neurological: He is alert.  Skin: Skin is warm.  Psychiatric: He has a normal mood and affect.    Assessment/Plan: Plan is to hold his Anticoagulant and Dr. Stann Mainland will do a Left Hip nailing tomorrow.Hospitalist will admit him.  Analiya Porco A 12/03/2016, 4:53 PM

## 2016-12-03 NOTE — ED Triage Notes (Signed)
Pt arrives via EMS from restaurant where pt was eating lunch. Reportedly slipped out of chair onto floor. Pt c/o left hip with external rotation. DP, sensation intact. fentanyl PTA with some relief of pain. Pt on blood thinner. Neg LOC. VSS. 18g RFA, CBG 84, SR on monitor.

## 2016-12-03 NOTE — ED Provider Notes (Signed)
[facility]-EMER[facility]ENCY DEPT Provider Note   CSN: [id number] Arrival date & time: 12/03/16  1353     History   Chief Complaint Chief Complaint  Patient presents with  . Fall  . Hip Pain    HPI Danny Oneill is a 83 y.o. male.  83 year old male with past medical history including CAD, CHF, atrial fibrillation, CK D, aortic insufficiency who presents with left hip pain after a fall. The patient was at lunch today when he slipped out of the chair and landed on his left hip. He did not strike his head or lose consciousness. He had an immediate onset of severe, constant left hip pain that is currently improved after receiving fentanyl by EMS. He denies any other injuries other than skin tears from the fall. He is up-to-date on tetanus. He denies anticoagulant use.   The history is provided by the patient.    Past Medical History:  Diagnosis Date  . Anemia   . Aortic insufficiency 03/09/2016   a. mild-mod AI by echo 03/04/16.  . Arthritis   . Asbestosis (HCC)    "no breathing problems" pt states worked in [employer] for many yrs and was exposed to asbestosis  . Bladder outlet obstruction 03/09/2016  . CAD (coronary artery disease)    a. 02/2016: inf lat STEMI with occluded OM2 which was treated with DES; otherwise he had moderate 50% oRCA, 40% oLM, 70% D1, 10% mLAD, 80% lateral 2nd marg which was treated medically.  . Cardiomyopathy, ischemic   . Chronic combined systolic and diastolic CHF (congestive heart failure) (HCC)    a. EF 30-35% at time of STEMI 02/2016, improved to 40-45% by echo 11/20 (but with pericardial effusion). b. Further improved EF to 55-60% 03/04/16.  Marland Kitchen CKD (chronic kidney disease), stage III   . Coronary artery disease   . Dyspnea   . First degree AV block   . Hyperlipidemia   . Hyponatremia   . Lumbar spondylosis   . Myocardial infarction (HCC)   . Orthostatic hypotension   . PAF (paroxysmal atrial fibrillation) (HCC)    a. dx 02/2016: not anticoagulated due to  pericardial effusion; amiodarone stopped due to suspected allergic drug rash.  . Pericardial effusion    a. 02/2016 - followed clinically (mod-large at first then small-mod in f/u echo).    Patient Active Problem List   Diagnosis Date Noted  . Allergic drug rash 03/09/2016  . Orthostatic hypotension 03/09/2016  . Hyponatremia 03/09/2016  . Hypokalemia 03/09/2016  . Bladder outlet obstruction 03/09/2016  . Urinary retention 03/09/2016  . First degree AV block 03/09/2016  . Aortic insufficiency 03/09/2016  . CKD (chronic kidney disease), stage III 03/09/2016  . Anemia   . Pericardial effusion 02/27/2016  . PAF (paroxysmal atrial fibrillation) (HCC) 02/27/2016  . CAD (coronary artery disease) 02/27/2016  . Cardiomyopathy, ischemic 02/27/2016  . Acute on chronic combined systolic and diastolic CHF (congestive heart failure) (HCC) 02/24/2016  . Systolic and diastolic CHF, acute (HCC) 02/13/2016  . Anterior and lateral ST segment elevation (HCC) 02/13/2016  . ARF (acute renal failure) (HCC) 02/13/2016  . Acute urinary retention 02/13/2016  . Hematuria 02/13/2016  . Elevated liver enzymes 02/13/2016  . Bilateral lower extremity edema 02/13/2016  . Low BP 02/13/2016  . Hyperlipidemia 02/13/2016  . Acute ST elevation myocardial infarction (STEMI) (HCC)   . Acute inferolateral myocardial infarction (HCC) 02/02/2016  . Acute MI, inferolateral wall, initial episode of care (HCC)   . Arterial hypotension   . Overweight (BMI  25.0-29.9) 10/04/2013  . Expected blood loss anemia 10/04/2013  . S/P right TKA 10/03/2013    Past Surgical History:  Procedure Laterality Date  . CARDIAC CATHETERIZATION N/A 02/02/2016   Procedure: Left Heart Cath and Coronary Angiography;  Surgeon: Corky Crafts, MD;  Location: [facility] INVASIVE [facility];  Service: Cardiovascular;  Laterality: N/A;  . CARDIAC CATHETERIZATION N/A 02/02/2016   Procedure: Coronary Stent Intervention;  Surgeon: Corky Crafts, MD;   Location: [name] INVASIVE CV LAB;  Service: Cardiovascular;  Laterality: N/A;  . CARDIAC CATHETERIZATION N/A 02/02/2016   Procedure: IABP Insertion;  Surgeon: Corky Crafts, MD;  Location: [facility];  Service: Cardiovascular;  Laterality: N/A;  . CATARACTS REMOVED     BIL  . ROTATOR CUFF REPAIR  2011   L SHOULDER  . TOTAL KNEE ARTHROPLASTY Right 10/03/2013   Procedure: RIGHT TOTAL KNEE ARTHROPLASTY;  Surgeon: [name], MD;  Location: [facility];  Service: Orthopedics;  Laterality: Right;       Home Medications    Prior to Admission medications   Medication Sig Start Date End Date Taking? Authorizing Provider  aspirin EC 81 MG tablet Take 81 mg by mouth daily with breakfast.     [provider]  atorvastatin (LIPITOR) 20 MG tablet Take 1 tablet (20 mg total) by mouth daily. 04/07/16   Lennette Bihari, MD  carvedilol (COREG) 6.25 MG tablet Take 1 tablet (6.25 mg total) by mouth 2 (two) times daily. Patient taking differently: Take 3.125 mg by mouth 2 (two) times daily.  04/28/16   Lennette Bihari, MD  furosemide (LASIX) 20 MG tablet Take 1 tablet by mouth daily and ok to use extra tablet as needed for swelling. 10/14/16   Cardama, Amadeo Garnet, MD  lisinopril (PRINIVIL,ZESTRIL) 2.5 MG tablet Take 1 tablet (2.5 mg total) by mouth daily. 09/21/16 12/20/16  Lennette Bihari, MD  tamsulosin (FLOMAX) 0.4 MG CAPS capsule TAKE 1 CAPSULE(0.4 MG) BY MOUTH DAILY AFTER SUPPER 04/01/16   Dunn, Tacey Ruiz, PA-C  ticagrelor (BRILINTA) 90 MG TABS tablet Take 1 tablet (90 mg total) by mouth 2 (two) times daily. 04/07/16   Lennette Bihari, MD    Family History Family History  Problem Relation Age of Onset  . Cancer Mother     Social History Social History  Substance Use Topics  . Smoking status: Never Smoker  . Smokeless tobacco: Never Used  . Alcohol use No     Allergies   Amiodarone and Morphine and related   Review of Systems Review of Systems All other systems reviewed  and are negative except that which was mentioned in HPI   Physical Exam Updated Vital Signs BP 126/79   Pulse 62   Resp 16   SpO2 96%   Physical Exam  Constitutional: He is oriented to person, place, and time. He appears well-developed and well-nourished. No distress.  HENT:  Head: Normocephalic and atraumatic.  Moist mucous membranes  Eyes: Pupils are equal, round, and reactive to light. Conjunctivae are normal.  Neck: Neck supple.  Cardiovascular: Regular rhythm and intact distal pulses.  Bradycardia present.   Murmur heard. Pulmonary/Chest: Effort normal and breath sounds normal.  Abdominal: Soft. Bowel sounds are normal. He exhibits no distension. There is no tenderness.  Musculoskeletal: He exhibits tenderness and deformity. He exhibits no edema.  L leg shortened and externally rotated; 1+ pitting edema b/l LE  Neurological: He is alert and oriented to person, place, and time. No sensory  deficit.  Fluent speech  Skin: Skin is warm and dry.  Ecchymoses L elbow, forearm, hand; small skin tears L elbow, dorsal wrist, dorsal hand; old ecchymosis top of head  Psychiatric: He has a normal mood and affect. Judgment normal.  Nursing note and vitals reviewed.    ED Treatments / Results  Labs (all labs ordered are listed, but only abnormal results are displayed) Labs Reviewed  BASIC METABOLIC PANEL - Abnormal; Notable for the following:       Result Value   Glucose, Bld 104 (*)    BUN 28 (*)    Creatinine, Ser 1.29 (*)    GFR calc non Af Amer 50 (*)    GFR calc Af Amer 57 (*)    All other components within normal limits  CBC - Abnormal; Notable for the following:    RBC 3.55 (*)    Hemoglobin 10.9 (*)    HCT 33.5 (*)    RDW 16.6 (*)    Platelets 134 (*)    All other components within normal limits    EKG  EKG Interpretation None       Radiology Dg Chest 1 View  Result Date: 12/03/2016 CLINICAL DATA:  83 year old male status post fall. EXAM: CHEST 1 VIEW  COMPARISON:  CT 07/08/2016 FINDINGS: The heart size and mediastinal contours are enlarged. There is bibasilar atelectasis without focal opacity, sizable effusion or pneumothorax. The visualized skeletal structures are unremarkable. IMPRESSION: Cardiomegaly and bibasilar atelectasis without focal opacity. Electronically Signed   By: Sande Brothers M.D.   On: 12/03/2016 15:35   Dg Hip Unilat With Pelvis 2-3 Views Left  Result Date: 12/03/2016 CLINICAL DATA:  83 year old male with left hip pain status post fall. EXAM: DG HIP (WITH OR WITHOUT PELVIS) 2-3V LEFT COMPARISON:  None. FINDINGS: There is a comminuted subtrochanteric hip fracture on the left with associated angulation and displacement of the lesser trochanter. The right hip is intact. Visualized bony pelvis is intact. Soft tissues grossly unremarkable. IMPRESSION: Comminuted subtrochanteric hip fracture with angulation and displacement of multiple fracture fragments. Electronically Signed   By: Sande Brothers M.D.   On: 12/03/2016 15:34    Procedures Procedures (including critical care time)  Medications Ordered in ED Medications  bacitracin ointment 1 application (1 application Topical Given 12/03/16 1449)  fentaNYL (SUBLIMAZE) injection 100 mcg (100 mcg Intravenous Given 12/03/16 1449)     Initial Impression / Assessment and Plan / ED Course  I have reviewed the triage vital signs and the nursing notes.  Pertinent labs & imaging results that were available during my care of the patient were reviewed by me and considered in my medical decision making (see chart for details).    Pt w/ L hip injury after Mechanical fall. He was awake and alert, in no acute distress at presentation. Vital signs stable. Normal distal sensation and pulses, however left leg shortened and externally rotated. X-rays show comminuted subtrochanteric hip fracture with angulation and displacement of fragments. Chest x-ray negative acute. Labs show hemoglobin 10.9,  creatinine 1.29. Discussed with [facility] orthopedics who will see the patient in consultation. Discussed admission with [facility], Dr. Melynda Ripple, and pt admitted for further care.  Final Clinical Impressions(s) / ED Diagnoses   Final diagnoses:  Closed fracture of left hip, initial encounter [facility])    New Prescriptions New Prescriptions   No medications on file     Little, Ambrose Finland, MD 12/03/16 [phone]

## 2016-12-03 NOTE — Progress Notes (Signed)
Pt admitted to unit from ED with family attending. Pt noted to be laying on his right side. Pt and family oriented to room and call bell. Pt is experiencing discomfort, has been previously medicated in the ED. Orders to be noted and will continue with pain med regimine. NIght nurse advised per family to not administer brillenta  Or plavix as pt will be have surgery tmrw. NPO after midnight

## 2016-12-03 NOTE — ED Notes (Signed)
Condom cath placed.

## 2016-12-04 DIAGNOSIS — Z01818 Encounter for other preprocedural examination: Secondary | ICD-10-CM

## 2016-12-04 DIAGNOSIS — S72002A Fracture of unspecified part of neck of left femur, initial encounter for closed fracture: Secondary | ICD-10-CM

## 2016-12-04 LAB — BASIC METABOLIC PANEL
Anion gap: 10 (ref 5–15)
BUN: 28 mg/dL — AB (ref 6–20)
CHLORIDE: 103 mmol/L (ref 101–111)
CO2: 26 mmol/L (ref 22–32)
CREATININE: 1.16 mg/dL (ref 0.61–1.24)
Calcium: 9 mg/dL (ref 8.9–10.3)
GFR calc Af Amer: >60 mL/min (ref >60)
GFR calc non Af Amer: 56 mL/min — ABNORMAL LOW (ref >60)
GLUCOSE: 126 mg/dL — AB (ref 65–99)
Potassium: 4.6 mmol/L (ref 3.5–5.1)
SODIUM: 139 mmol/L (ref 135–145)

## 2016-12-04 LAB — SURGICAL PCR SCREEN
MRSA, PCR: NEGATIVE
Staphylococcus aureus: NEGATIVE

## 2016-12-04 LAB — CBC
HEMATOCRIT: 34 % — AB (ref 39.0–52.0)
Hemoglobin: 10.8 g/dL — ABNORMAL LOW (ref 13.0–17.0)
MCH: 30.2 pg (ref 26.0–34.0)
MCHC: 31.8 g/dL (ref 30.0–36.0)
MCV: 95 fL (ref 78.0–100.0)
PLATELETS: 136 10*3/uL — AB (ref 150–400)
RBC: 3.58 MIL/uL — ABNORMAL LOW (ref 4.22–5.81)
RDW: 15.9 % — AB (ref 11.5–15.5)
WBC: 6.7 10*3/uL (ref 4.0–10.5)

## 2016-12-04 MED ORDER — SUGAMMADEX SODIUM 200 MG/2ML IV SOLN
INTRAVENOUS | Status: AC
  Filled 2016-12-04: qty 2

## 2016-12-04 MED ORDER — PROPOFOL 10 MG/ML IV BOLUS
INTRAVENOUS | Status: AC
  Filled 2016-12-04: qty 20

## 2016-12-04 MED ORDER — ONDANSETRON HCL 4 MG/2ML IJ SOLN
INTRAMUSCULAR | Status: AC
  Filled 2016-12-04: qty 4

## 2016-12-04 MED ORDER — ATORVASTATIN CALCIUM 20 MG PO TABS
20.0000 mg | ORAL_TABLET | Freq: Every day | ORAL | Status: DC
  Administered 2016-12-04 – 2016-12-07 (×4): 20 mg via ORAL
  Filled 2016-12-04 (×4): qty 1

## 2016-12-04 MED ORDER — ASPIRIN EC 81 MG PO TBEC
81.0000 mg | DELAYED_RELEASE_TABLET | Freq: Every day | ORAL | Status: DC
  Administered 2016-12-05 – 2016-12-07 (×3): 81 mg via ORAL
  Filled 2016-12-04 (×3): qty 1

## 2016-12-04 MED ORDER — LACTATED RINGERS IV SOLN
INTRAVENOUS | Status: DC
  Administered 2016-12-08: 16:00:00 via INTRAVENOUS

## 2016-12-04 MED ORDER — LIDOCAINE 2% (20 MG/ML) 5 ML SYRINGE
INTRAMUSCULAR | Status: AC
  Filled 2016-12-04: qty 5

## 2016-12-04 MED ORDER — TICAGRELOR 90 MG PO TABS
90.0000 mg | ORAL_TABLET | Freq: Two times a day (BID) | ORAL | Status: DC
  Filled 2016-12-04 (×2): qty 1

## 2016-12-04 MED ORDER — TAMSULOSIN HCL 0.4 MG PO CAPS
0.4000 mg | ORAL_CAPSULE | Freq: Every day | ORAL | Status: DC
  Administered 2016-12-05 – 2016-12-08 (×4): 0.4 mg via ORAL
  Filled 2016-12-04 (×4): qty 1

## 2016-12-04 MED ORDER — PHENYLEPHRINE 40 MCG/ML (10ML) SYRINGE FOR IV PUSH (FOR BLOOD PRESSURE SUPPORT)
PREFILLED_SYRINGE | INTRAVENOUS | Status: AC
  Filled 2016-12-04: qty 10

## 2016-12-04 MED ORDER — CARVEDILOL 3.125 MG PO TABS
3.1250 mg | ORAL_TABLET | Freq: Two times a day (BID) | ORAL | Status: DC
  Administered 2016-12-04 – 2016-12-08 (×8): 3.125 mg via ORAL
  Filled 2016-12-04 (×8): qty 1

## 2016-12-04 MED ORDER — DEXAMETHASONE SODIUM PHOSPHATE 10 MG/ML IJ SOLN
INTRAMUSCULAR | Status: AC
  Filled 2016-12-04: qty 2

## 2016-12-04 MED ORDER — FENTANYL CITRATE (PF) 250 MCG/5ML IJ SOLN
INTRAMUSCULAR | Status: AC
  Filled 2016-12-04: qty 5

## 2016-12-04 MED ORDER — EPHEDRINE 5 MG/ML INJ
INTRAVENOUS | Status: AC
  Filled 2016-12-04: qty 10

## 2016-12-04 NOTE — Care Management Note (Signed)
Case Management Note  Patient Details  Name: Danny Oneill MRN: [id number] Date of Birth: [birth year]  Subjective/Objective:              Patient from home w wife, admitted after falling at restaurant. Ortho consult states Dr. Aundria Rud will do a Left Hip nailing 8/31. Pt eval after sx, mat need SNF. CM and CSW will continue to follow.      Action/Plan:   Expected Discharge Date:                  Expected Discharge Plan:     In-House Referral:  Clinical Social Work  Discharge planning Services  CM Consult  Post Acute Care Choice:    Choice offered to:     DME Arranged:    DME Agency:     HH Arranged:    HH Agency:     Status of Service:  In process, will continue to follow  If discussed at Long Length of Stay Meetings, dates discussed:    Additional Comments:  Lawerance Sabal, RN 12/04/2016, 10:04 AM

## 2016-12-04 NOTE — Progress Notes (Addendum)
PROGRESS NOTE    Danny Oneill  EXB:[id number] DOB: 08-26-1931 DOA: 12/03/2016 PCP: Geoffry Paradise, MD    Brief Narrative:  84 yo male who presented after suffering a mechanical fall. Patient is known to have bladder outlet obstruction, coronary disease, and chronic kidney disease. Patient fell while on a seated position, patient developed significant hip pain after the fall, unable to stand back up on his feet, patient was brought to the hospital for further evaluation. On the physical examination his blood pressure was 126/79, heart rate 62, respiratory rate 16, oxygen saturation 96%. Moist mucous membranes, lungs clear to auscultation bilaterally, heart S1-S2 present and rhythmic, abdomen soft nontender, no lower extremity edema. Sodium 140, potassium 4.7, chloride 105, bicarbonate 26, glucose 104, BUN 28, creatinine 1.29, white count of 4.5, Hb 10.9, hematocrit 33.5, platelets 134. hest x-ray with signs of hyperinflation, bilateral pleural plaque calcifications. EKG with low voltage, positive PACs and PVCs. Left hip film with comminuted subtrochanteric hip fracture with angulation and displacement of multiple fracture fragments.  Patient admitted to the hospital with working diagnosis of acute left hip fracture.   Assessment & Plan:   Principal Problem:   Closed left hip fracture (HCC) Active Problems:   Hyperlipidemia   CKD (chronic kidney disease), stage III   Hip fracture (HCC)    1. Acute left subtrochanteric fracture. Will continue pain control and dvt prophylaxis, plan for surgical intervention today, will need further physical therapy evaluation.   2. Chronic kidney disease stage III. Renal function with stable serum cr at 1,16, will follow on renal panel in am, avoid hypotension or nephrotoxic medications.   3. Chronic congestive diastolic heart failure. Stable, with no signs of exacerbation, will continue coreg, will hold on ace ing and diuretic therapy for now to prevent  hypotension. Ejection fraction with EF 35 to 40%.   4. Coronary artery disease. Old records personally reviewed, patient had a DES placed in the circumflex coronary, urgent cardiac catheterization for inferolateral STEMI, in 01/2016. Will resume antiplatelet therapy with asa and will hold on ticagrelor, per cardiology recommendations, continue statin therapy.      DVT prophylaxis:  Code Status:  Family Communication:  Disposition Plan:    Consultants:   Orthopedics  Cardiology   Procedures:     Antimicrobials:      Subjective: Patient with pain well controlled, worse with movement, no radiation, no associated nausea or vomiting, no fever or chills. Moderate to sever in intensity when present.   Objective: Vitals:   12/03/16 1810 12/03/16 1859 12/03/16 2239 12/04/16 0441  BP: (!) 145/85 (!) 124/58 (!) 105/59 (!) 118/58  Pulse: 68 71 76 77  Resp: Temp:  (!) 97.5 F (36.4 C) 97.9 F (36.6 C) 98.8 F (37.1 C)  TempSrc:  Oral Axillary Oral  SpO2: 95% 95% 95% 97%    Intake/Output Summary (Last 24 hours) at 12/04/16 1117 Last data filed at 12/04/16 1023  Gross per 24 hour  Intake                0 ml  Output              400 ml  Net             -400 ml   There were no vitals filed for this visit.  Examination:  General: deconditioned Neurology: Awake and alert, non focal  E ENT: mild pallor, no icterus, oral mucosa dry Cardiovascular: S1-S2 present, rhythmic, no gallops, rubs,  or murmurs. No jugular venous distention, no lower extremity edema. Pulmonary: vesicular breath sounds bilaterally, adequate air movement, no wheezing, rhonchi or rales. Gastrointestinal. Abdomen flat, no organomegaly, non tender, no rebound or guarding Skin. No rashes Musculoskeletal: no joint deformities     Data Reviewed: I have personally reviewed following labs and imaging studies  CBC:  Recent Labs Lab 12/03/16 1410 12/04/16 0329  WBC 4.5 6.7  HGB 10.9*  10.8*  HCT 33.5* 34.0*  MCV 94.4 95.0  PLT 134* 136*   Basic Metabolic Panel:  Recent Labs Lab 12/03/16 1410 12/04/16 0329  NA 140 139  K 4.7 4.6  CL 105 103  CO2 26 26  GLUCOSE 104* 126*  BUN 28* 28*  CREATININE 1.29* 1.16  CALCIUM 9.0 9.0   GFR: CrCl cannot be calculated (Unknown ideal weight.). Liver Function Tests: No results for input(s): AST, ALT, ALKPHOS, BILITOT, PROT, ALBUMIN in the last 168 hours. No results for input(s): LIPASE, AMYLASE in the last 168 hours. No results for input(s): AMMONIA in the last 168 hours. Coagulation Profile: No results for input(s): INR, PROTIME in the last 168 hours. Cardiac Enzymes: No results for input(s): CKTOTAL, CKMB, CKMBINDEX, TROPONINI in the last 168 hours. BNP (last 3 results) No results for input(s): PROBNP in the last 8760 hours. HbA1C: No results for input(s): HGBA1C in the last 72 hours. CBG: No results for input(s): GLUCAP in the last 168 hours. Lipid Profile: No results for input(s): CHOL, HDL, LDLCALC, TRIG, CHOLHDL, LDLDIRECT in the last 72 hours. Thyroid Function Tests: No results for input(s): TSH, T4TOTAL, FREET4, T3FREE, THYROIDAB in the last 72 hours. Anemia Panel: No results for input(s): VITAMINB12, FOLATE, FERRITIN, TIBC, IRON, RETICCTPCT in the last 72 hours.    Radiology Studies: I have reviewed all of the imaging during this hospital visit personally     Scheduled Meds: . bacitracin  1 application Topical BID  . docusate sodium  100 mg Oral BID  . sodium chloride flush  3 mL Intravenous Q12H   Continuous Infusions: .  ceFAZolin (ANCEF) IV    . lactated ringers       LOS: 1 day      Coralie Keens, MD [facility] Pager [phone]

## 2016-12-04 NOTE — Consult Note (Signed)
Cardiology Consultation:   Patient ID: Danny Oneill; [id number]; Jun 24, 1931   Admit date: 12/03/2016 Date of Consult: 12/04/2016  Primary Care Provider: Geoffry Paradise, MD Primary Cardiologist: Dr. Tresa Endo    Patient Profile:   Danny Oneill is a 83 y.o. male with a hx of CAD s/p DES to circumflex marginal, R knee arthroplasty, asbestosis exposure, HTN, HLD, aortic insufficiency, PAF , chronic combined CHF and CKD stage II who is being seen today for the evaluation of pre op clearance  at the request of Dr. Ella Jubilee.   Danny Oneill was admitted to [facility] hospital on 02/02/2016 with inferolateral STEMI.  Urgent cardiac catheterization showed 50% ostial RCA stenosis, 40% ostial left main stenosis, 70% ostial diagonal 1 stenosis, and total occlusion of the OM 2 vessel.  He underwent successful PCI with insertion of a 2.2516 mm DES stent into the circumflex marginal vessel.  This procedure was done by Dr. Eldridge Dace.  He was hypotensive in the lab.  Initially, a balloon pump was placed.  Except when echo Doppler study in 02/03/2016 showed an EF of 35-40% with akinesis of the inferior wall with moderate AR.  He was treated with Brilinta, aspirin, lisinopril, and atorvastatin.   On 02/26/2016.  He developed AF with RVR and was started on amiodarone and actually converted to sinus rhythm. Treated with heparin while in hospital. Anticoagulation did not started due to effusion.   Last echo 05/14/16 - Hypokinesis of the distal inferior and inferolateral walls with overall mildly reduced LV systolic function; grade 1 diastolic dysfunction; sclerotic aortic valve with mild AI; mild MR; mild TR with moderately elevated pulmonary pressure; mild dilated aortic root (4.3 cm)  Last seen by Dr. Tresa Endo 09/2016 for routine follow up. Had peripheral edema. Started on lasix.   History of Present Illness:   Danny Oneill presented after mechanical fall yesterday. He was eating in restaurant. The chair slipped when tried to stand  up and fall backward. No prodrome.  Work up revealed fracture of his left Hip. Last dose of ASA and Brillinta yesterday AM. He continued to have dyspnea on exertion without chest pain. Improved after lasix treatment. No orthopnea or PND.   EKG showed sinus rhythm with PVC, low voltage- personally reviewed. Scr of 1.16. Hgb 10.8. CXR without acute findings.    Past Medical History:  Diagnosis Date  . Anemia   . Aortic insufficiency 03/09/2016   a. mild-mod AI by echo 03/04/16.  . Arthritis   . Asbestosis (HCC)    "no breathing problems" pt states worked in [employer] for many yrs and was exposed to asbestosis  . Bladder outlet obstruction 03/09/2016  . CAD (coronary artery disease)    a. 02/2016: inf lat STEMI with occluded OM2 which was treated with DES; otherwise he had moderate 50% oRCA, 40% oLM, 70% D1, 10% mLAD, 80% lateral 2nd marg which was treated medically.  . Cardiomyopathy, ischemic   . Chronic combined systolic and diastolic CHF (congestive heart failure) (HCC)    a. EF 30-35% at time of STEMI 02/2016, improved to 40-45% by echo 11/20 (but with pericardial effusion). b. Further improved EF to 55-60% 03/04/16.  Marland Kitchen CKD (chronic kidney disease), stage III   . Coronary artery disease   . Dyspnea   . First degree AV block   . Hyperlipidemia   . Hyponatremia   . Lumbar spondylosis   . Myocardial infarction (HCC)   . Orthostatic hypotension   . PAF (paroxysmal atrial fibrillation) (HCC)    a. dx  02/2016: not anticoagulated due to pericardial effusion; amiodarone stopped due to suspected allergic drug rash.  . Pericardial effusion    a. 02/2016 - followed clinically (mod-large at first then small-mod in f/u echo).    Past Surgical History:  Procedure Laterality Date  . CARDIAC CATHETERIZATION N/A 02/02/2016   Procedure: Left Heart Cath and Coronary Angiography;  Surgeon: Corky Crafts, MD;  Location: [facility] INVASIVE CV LAB;  Service: Cardiovascular;  Laterality: N/A;  .  CARDIAC CATHETERIZATION N/A 02/02/2016   Procedure: Coronary Stent Intervention;  Surgeon: Corky Crafts, MD;  Location: [facility] INVASIVE CV LAB;  Service: Cardiovascular;  Laterality: N/A;  . CARDIAC CATHETERIZATION N/A 02/02/2016   Procedure: IABP Insertion;  Surgeon: Corky Crafts, MD;  Location: [facility] INVASIVE CV LAB;  Service: Cardiovascular;  Laterality: N/A;  . CATARACTS REMOVED     BIL  . ROTATOR CUFF REPAIR  2011   L SHOULDER  . TOTAL KNEE ARTHROPLASTY Right 10/03/2013   Procedure: RIGHT TOTAL KNEE ARTHROPLASTY;  Surgeon: [name], MD;  Location: [facility] ORS;  Service: Orthopedics;  Laterality: Right;     Inpatient Medications: Scheduled Meds: . aspirin EC  81 mg Oral Daily  . atorvastatin  20 mg Oral q1800  . bacitracin  1 application Topical BID  . carvedilol  3.125 mg Oral BID WC  . docusate sodium  100 mg Oral BID  . sodium chloride flush  3 mL Intravenous Q12H  . tamsulosin  0.4 mg Oral Daily  . ticagrelor  90 mg Oral BID   Continuous Infusions: .  ceFAZolin (ANCEF) IV    . lactated ringers     PRN Meds: acetaminophen **OR** acetaminophen, fentaNYL (SUBLIMAZE) injection, ondansetron **OR** ondansetron (ZOFRAN) IV  Allergies:    Allergies  Allergen Reactions  . Amiodarone Rash  . Morphine And Related Other (See Comments)    Confusion/ hallucinations     Social History:   Social History   Social History  . Marital status: Married    Spouse name: N/A  . Number of children: N/A  . Years of education: N/A   Occupational History  . Not on file.   Social History Main Topics  . Smoking status: Never Smoker  . Smokeless tobacco: Never Used  . Alcohol use No  . Drug use: No  . Sexual activity: Not on file   Other Topics Concern  . Not on file   Social History Narrative  . No narrative on file    Family History:    Family History  Problem Relation Age of Onset  . Cancer Mother      ROS:  Please see the history of present illness.  Review  of Systems  Cardiovascular: Positive for dyspnea on exertion.   All other ROS reviewed and negative.     Physical Exam/Data:   Vitals:   12/03/16 1859 12/03/16 2239 12/04/16 0441 12/04/16 1431  BP: (!) 124/58 (!) 105/59 (!) 118/58 120/65  Pulse: 71 76 77 71  Resp: 16 16 17 16   Temp: (!) 97.5 F (36.4 C) 97.9 F (36.6 C) 98.8 F (37.1 C) 98.1 F (36.7 C)  TempSrc: Oral Axillary Oral Oral  SpO2: 95% 95% 97% 97%    Intake/Output Summary (Last 24 hours) at 12/04/16 1533 Last data filed at 12/04/16 1023  Gross per 24 hour  Intake                0 ml  Output  400 ml  Net             -400 ml   There were no vitals filed for this visit. There is no height or weight on file to calculate BMI.  General:  Well nourished, well developed, in no acute distress HEENT: normal Lymph: no adenopathy Neck: no JVD Endocrine:  No thryomegaly Vascular: No carotid bruits; FA pulses 2+ bilaterally without bruits  Cardiac:  normal S1, S2; RRR; no murmur  Lungs:  clear to auscultation bilaterally, no wheezing, rhonchi or rales  Abd: soft, nontender, no hepatomegaly  Ext: no edema. ACE wrap on L hand from laceration.  Musculoskeletal:  No deformities, BUE and BLE strength normal and equal Skin: warm and dry  Neuro:  CNs 2-12 intact, no focal abnormalities noted Psych:  Normal affect    Relevant CV Studies: Cath 01/2016 Coronary Stent Intervention  IABP Insertion  Left Heart Cath and Coronary Angiography  Conclusion     Ost RCA lesion, 50 %stenosed. Mild pressure dampening with catheter engagement.  Ost LM lesion, 40 %stenosed.  Ost 1st Diag to 1st Diag lesion, 70 %stenosed.  LV end diastolic pressure is moderately elevated.  There is no aortic valve stenosis.  Mid LAD lesion, 10 %stenosed.  Ost 2nd Mrg to 2nd Mrg lesion, 100 %stenosed. A STENT SYNERGY DES 2.25X16 drug eluting stent was successfully placed.  Post intervention, there is a 0% residual stenosis.  Lat  2nd Mrg lesion, 80 %stenosed. The lesion is at the origin of a branch at the bifurcation distal to the stent.   Continue dual antiplatelet therapy for ideally a year. He'll need aggressive secondary prevention including lipid-lowering therapy.  Patient with hypotension in the Cath Lab. Balloon pump placed given that he has some renal insufficiency and LVEDP was already mildly elevated. Her options were limited. I did not want to start pressors if they could be avoided given that his heart rate is on the higher and and he already has renal insufficiency. Hopefully, the balloon pump can be removed tomorrow if his pressure has stabilized.  Will obtain echocardiogram to evaluate left ventricular function.    The patient denied any prior symptoms of angina before the past 2 days. Therefore, would not plan any routine elective angioplasty on this patient of his other moderate disease.  Continue aggressive medical therapy.     Laboratory Data:  Chemistry Recent Labs Lab 12/03/16 1410 12/04/16 0329  NA 140 139  K 4.7 4.6  CL 105 103  CO2 26 26  GLUCOSE 104* 126*  BUN 28* 28*  CREATININE 1.29* 1.16  CALCIUM 9.0 9.0  GFRNONAA 50* 56*  GFRAA 57* >60  ANIONGAP 9 10    No results for input(s): PROT, ALBUMIN, AST, ALT, ALKPHOS, BILITOT in the last 168 hours. Hematology Recent Labs Lab 12/03/16 1410 12/04/16 0329  WBC 4.5 6.7  RBC 3.55* 3.58*  HGB 10.9* 10.8*  HCT 33.5* 34.0*  MCV 94.4 95.0  MCH 30.7 30.2  MCHC 32.5 31.8  RDW 16.6* 15.9*  PLT 134* 136*   Cardiac EnzymesNo results for input(s): TROPONINI in the last 168 hours. No results for input(s): TROPIPOC in the last 168 hours.  BNPNo results for input(s): BNP, PROBNP in the last 168 hours.  DDimer No results for input(s): DDIMER in the last 168 hours.  Radiology/Studies:  Dg Chest 1 View  Result Date: 12/03/2016 CLINICAL DATA:  83 year old male status post fall. EXAM: CHEST 1 VIEW COMPARISON:  CT 07/08/2016 FINDINGS: The  heart size and mediastinal contours are enlarged. There is bibasilar atelectasis without focal opacity, sizable effusion or pneumothorax. The visualized skeletal structures are unremarkable. IMPRESSION: Cardiomegaly and bibasilar atelectasis without focal opacity. Electronically Signed   By: Sande Brothers M.D.   On: 12/03/2016 15:35   Dg Hip Unilat With Pelvis 2-3 Views Left  Result Date: 12/03/2016 CLINICAL DATA:  83 year old male with left hip pain status post fall. EXAM: DG HIP (WITH OR WITHOUT PELVIS) 2-3V LEFT COMPARISON:  None. FINDINGS: There is a comminuted subtrochanteric hip fracture on the left with associated angulation and displacement of the lesser trochanter. The right hip is intact. Visualized bony pelvis is intact. Soft tissues grossly unremarkable. IMPRESSION: Comminuted subtrochanteric hip fracture with angulation and displacement of multiple fracture fragments. Electronically Signed   By: Sande Brothers M.D.   On: 12/03/2016 15:34    Assessment and Plan:   1. Pre op clearance of left hip nailing due to fracture hip from mechanical fall - Last dose of ASA and Brillinta yesterday AM. PCI done on 02/02/16. She DOE without chest pain that improved on diuretics therapy. Dr. Tresa Endo to see. Plan for surgery later today. Ideally should wait 5 after on Brillinta hold.   2. CAD s/p DES - No angina.  Resume ASA and Brillinta per surgery.  - Continue statin, coreg.   3, Chronic combined CHF - Currently volume status stable. Lasix on hold. Watch for fluid overload.   4. HLD - 02/02/2016: Cholesterol 131; HDL 44; LDL Cholesterol 75; Triglycerides 61; VLDL 12  - Continue statin. LDL close to goal of less than 70.  Lorelei Pont, [place]  12/04/2016 3:33 PM    Patient seen and examined. Agree with assessment and plan. Danny Oneill is an 83 year old gentleman who is well-known to me.  He suffered an nferolateral STEMI, located by hypotension for which balloon pump was necessary.   He underwent successful stenting of an occluded circumflex marginal vessel.  He had concomitant CAD as noted above.  He had an episode of PAF over the past year treated transiently with amiodarone with ultimate conversion to sinus rhythm. An echo Doppler study [facility] 2018 showed an EF at increased to 45-50%.  There was grade 1 diastolic dysfunction, moderate pulmonary hypertension, mild aortic insufficiency with mild dilation of his aortic root with mild MR. He is also developed wedge compression fracture of his lumbar vertebrae. My cardiac standpoint, he has been stable without recurrent anginal symptoms or significant dyspnea. He was admitted to the hospital last evening and has been diagnosed with a left hip fracture for which he is tentatively scheduled for surgery today.  The patient has been on aspirin/Brilinta since his myocardial infarction and received his last dose of Brilinta yesterday.  As result, he would have significant increased bleed risk if he undergo surgery today.  I have spoken with Dr. Aundria Rud as well as Dr. Darrelyn Hillock.  It is my recommendation that ideally Brilinta held for 5 days prior to undergoing elective surgery to reduce high likelihood for significant intraoperative bleed risk.   As result, I recommend surgery be deferred today if the patient is stable otherwise orthopedically.  If urgency develops and surgery needs to be done sooner than Tuesday perhaps this can be done after at least holding Brilinta for minimum of 3 days.    Lennette Bihari, MD, [facility] 12/04/2016 4:18 PM

## 2016-12-04 NOTE — Consult Note (Signed)
ORTHOPAEDIC CONSULTATION  REQUESTING PHYSICIAN: Arrien, Danny Oneill,*  PCP:  Geoffry Paradise, MD  Chief Complaint: Left hip pain  HPI: Danny Oneill is a 83 y.o. male who complains of left hip pain following a fall at a restaurant yesterday afternoon. He presented to the emergency department yesterday evening and was evaluated by my partner Dr. Darrelyn Hillock.  He was found to have a left hip comminuted peritrochanteric fracture. I was asked to assume care since I was on call today. Patient denies any radicular pain at this time or numbness or motor deficits in the left leg. No antecedent left hip pain.    Past Medical History:  Diagnosis Date  . Anemia   . Aortic insufficiency 03/09/2016   a. mild-mod AI by echo 03/04/16.  . Arthritis   . Asbestosis (HCC)    "no breathing problems" pt states worked in [employer] for many yrs and was exposed to asbestosis  . Bladder outlet obstruction 03/09/2016  . CAD (coronary artery disease)    a. 02/2016: inf lat STEMI with occluded OM2 which was treated with DES; otherwise he had moderate 50% oRCA, 40% oLM, 70% D1, 10% mLAD, 80% lateral 2nd marg which was treated medically.  . Cardiomyopathy, ischemic   . Chronic combined systolic and diastolic CHF (congestive heart failure) (HCC)    a. EF 30-35% at time of STEMI 02/2016, improved to 40-45% by echo 11/20 (but with pericardial effusion). b. Further improved EF to 55-60% 03/04/16.  Marland Kitchen CKD (chronic kidney disease), stage III   . Coronary artery disease   . Dyspnea   . First degree AV block   . Hyperlipidemia   . Hyponatremia   . Lumbar spondylosis   . Myocardial infarction (HCC)   . Orthostatic hypotension   . PAF (paroxysmal atrial fibrillation) (HCC)    a. dx 02/2016: not anticoagulated due to pericardial effusion; amiodarone stopped due to suspected allergic drug rash.  . Pericardial effusion    a. 02/2016 - followed clinically (mod-large at first then small-mod in f/u echo).   Past  Surgical History:  Procedure Laterality Date  . CARDIAC CATHETERIZATION N/A 02/02/2016   Procedure: Left Heart Cath and Coronary Angiography;  Surgeon: Corky Crafts, MD;  Location: [facility] INVASIVE CV LAB;  Service: Cardiovascular;  Laterality: N/A;  . CARDIAC CATHETERIZATION N/A 02/02/2016   Procedure: Coronary Stent Intervention;  Surgeon: Corky Crafts, MD;  Location: [name] INVASIVE CV LAB;  Service: Cardiovascular;  Laterality: N/A;  . CARDIAC CATHETERIZATION N/A 02/02/2016   Procedure: IABP Insertion;  Surgeon: Corky Crafts, MD;  Location: [facility] INVASIVE CV LAB;  Service: Cardiovascular;  Laterality: N/A;  . CATARACTS REMOVED     BIL  . ROTATOR CUFF REPAIR  2011   L SHOULDER  . TOTAL KNEE ARTHROPLASTY Right 10/03/2013   Procedure: RIGHT TOTAL KNEE ARTHROPLASTY;  Surgeon: [name], MD;  Location: [facility];  Service: Orthopedics;  Laterality: Right;   Social History   Social History  . Marital status: Married    Spouse name: N/A  . Number of children: N/A  . Years of education: N/A   Social History Main Topics  . Smoking status: Never Smoker  . Smokeless tobacco: Never Used  . Alcohol use No  . Drug use: No  . Sexual activity: Not on file   Other Topics Concern  . Not on file   Social History Narrative  . No narrative on file   Family History  Problem Relation Age of Onset  .  Cancer Mother    Allergies  Allergen Reactions  . Amiodarone Rash  . Morphine And Related Other (See Comments)    Confusion/ hallucinations    Prior to Admission medications   Medication Sig Start Date End Date Taking? Authorizing Provider  aspirin EC 81 MG tablet Take 81 mg by mouth daily with breakfast.    Yes [provider]  atorvastatin (LIPITOR) 20 MG tablet Take 1 tablet (20 mg total) by mouth daily. 04/07/16  Yes Lennette Bihari, MD  carvedilol (COREG) 3.125 MG tablet Take 3.125 mg by mouth 2 (two) times daily. 11/18/16  Yes [provider]  furosemide  (LASIX) 20 MG tablet Take 1 tablet by mouth daily and ok to use extra tablet as needed for swelling. 10/14/16  Yes Cardama, Amadeo Garnet, MD  lisinopril (PRINIVIL,ZESTRIL) 2.5 MG tablet Take 1 tablet (2.5 mg total) by mouth daily. 09/21/16 12/20/16 Yes Lennette Bihari, MD  tamsulosin (FLOMAX) 0.4 MG CAPS capsule TAKE 1 CAPSULE(0.4 MG) BY MOUTH DAILY AFTER SUPPER 04/01/16  Yes Dunn, Tacey Ruiz, PA-C  ticagrelor (BRILINTA) 90 MG TABS tablet Take 1 tablet (90 mg total) by mouth 2 (two) times daily. 04/07/16  Yes Lennette Bihari, MD  carvedilol (COREG) 6.25 MG tablet Take 1 tablet (6.25 mg total) by mouth 2 (two) times daily. Patient not taking: Reported on 12/03/2016 04/28/16   Lennette Bihari, MD   Dg Chest 1 View  Result Date: 12/03/2016 CLINICAL DATA:  83 year old male status post fall. EXAM: CHEST 1 VIEW COMPARISON:  CT 07/08/2016 FINDINGS: The heart size and mediastinal contours are enlarged. There is bibasilar atelectasis without focal opacity, sizable effusion or pneumothorax. The visualized skeletal structures are unremarkable. IMPRESSION: Cardiomegaly and bibasilar atelectasis without focal opacity. Electronically Signed   By: Sande Brothers M.D.   On: 12/03/2016 15:35   Dg Hip Unilat With Pelvis 2-3 Views Left  Result Date: 12/03/2016 CLINICAL DATA:  83 year old male with left hip pain status post fall. EXAM: DG HIP (WITH OR WITHOUT PELVIS) 2-3V LEFT COMPARISON:  None. FINDINGS: There is a comminuted subtrochanteric hip fracture on the left with associated angulation and displacement of the lesser trochanter. The right hip is intact. Visualized bony pelvis is intact. Soft tissues grossly unremarkable. IMPRESSION: Comminuted subtrochanteric hip fracture with angulation and displacement of multiple fracture fragments. Electronically Signed   By: Sande Brothers M.D.   On: 12/03/2016 15:34    Positive ROS: All other systems have been reviewed and were otherwise negative with the exception of those  mentioned in the HPI and as above.  Physical Exam: General: Alert, no acute distress Cardiovascular: No pedal edema Respiratory: No cyanosis, no use of accessory musculature GI: No organomegaly, abdomen is soft and non-tender Skin: No lesions in the area of chief complaint Neurologic: Sensation intact distally Psychiatric: Patient is competent for consent with normal mood and affect Lymphatic: No axillary or cervical lymphadenopathy  MUSCULOSKELETAL:  Left lower extremity:  The leg is held in a shoulder rotation and somewhat shortened compared to the right. Otherwise he endorses sensation intact to light touch distally in the deep and superficial peroneal nerves, sural saphenous nerves, and tibial nerve. Motor is intact with tibialis anterior/gastrocsoleus/flexor hallucis and extensor hallucis longus. 2+ dorsalis pedis pulse. No pain with passive stretch. The calf is soft and nontender.  Assessment: Left hip closed comminuted peritrochanteric hip fracture  Plan: - We will plan for operative fixation via intramedullary nail  - We appreciate the medicine service admission as he  does have multiple medical comorbidities that, acute situation somewhat. He is on antiplatelet regimen preoperatively that has been held yesterday and today, and he will be appropriate from an forward with this procedure utilizing small percutaneous incisions. - QUESTIONS WERE SOLICITED AND ANSWERED FROM HIM AND HIS DAUGHTER AND HIMSELF ON THE FLOOR. - The risks, benefits, and alternatives were discussed with the patient. There are risks associated with the surgery including, but not limited to, problems with anesthesia (death), infection, differences in leg length/angulation/rotation, fracture of bones, loosening or failure of implants, malunion, nonunion, hematoma (blood accumulation) which may require surgical drainage, blood clots, pulmonary embolism, nerve injury (foot drop), and blood vessel injury. The patient  understands these risks and elects to proceed.  - He will return to the medicine service postoperatively for routine postoperative and perioperative care.  - at this time after speaking with the cardiologist we are planning to delay the surgery until Tuesday afternoon in his best interest.  He is in need of a 5 day washout from his Brillinta and aspirin.  I appreciate the input from cardiology to this point.   Yolonda Kida, MD Cell [phone]    12/04/2016 12:31 PM

## 2016-12-05 DIAGNOSIS — I2583 Coronary atherosclerosis due to lipid rich plaque: Secondary | ICD-10-CM

## 2016-12-05 LAB — BASIC METABOLIC PANEL
Anion gap: 7 (ref 5–15)
BUN: 31 mg/dL — AB (ref 6–20)
CHLORIDE: 103 mmol/L (ref 101–111)
CO2: 28 mmol/L (ref 22–32)
CREATININE: 1.14 mg/dL (ref 0.61–1.24)
Calcium: 8.9 mg/dL (ref 8.9–10.3)
GFR calc non Af Amer: 58 mL/min — ABNORMAL LOW (ref >60)
GLUCOSE: 125 mg/dL — AB (ref 65–99)
Potassium: 4.8 mmol/L (ref 3.5–5.1)
SODIUM: 138 mmol/L (ref 135–145)

## 2016-12-05 LAB — CBC WITH DIFFERENTIAL/PLATELET
Basophils Absolute: 0 10*3/uL (ref 0.0–0.1)
Basophils Relative: 0 %
EOS ABS: 0.3 10*3/uL (ref 0.0–0.7)
EOS PCT: 4 %
HCT: 33.3 % — ABNORMAL LOW (ref 39.0–52.0)
Hemoglobin: 10.7 g/dL — ABNORMAL LOW (ref 13.0–17.0)
LYMPHS ABS: 1.5 10*3/uL (ref 0.7–4.0)
LYMPHS PCT: 21 %
MCH: 30.1 pg (ref 26.0–34.0)
MCHC: 32.1 g/dL (ref 30.0–36.0)
MCV: 93.5 fL (ref 78.0–100.0)
MONO ABS: 0.9 10*3/uL (ref 0.1–1.0)
MONOS PCT: 12 %
Neutro Abs: 4.4 10*3/uL (ref 1.7–7.7)
Neutrophils Relative %: 63 %
PLATELETS: 143 10*3/uL — AB (ref 150–400)
RBC: 3.56 MIL/uL — ABNORMAL LOW (ref 4.22–5.81)
RDW: 16 % — ABNORMAL HIGH (ref 11.5–15.5)
WBC: 7 10*3/uL (ref 4.0–10.5)

## 2016-12-05 MED ORDER — MELATONIN 3 MG PO TABS
3.0000 mg | ORAL_TABLET | Freq: Every day | ORAL | Status: DC
  Administered 2016-12-05 – 2016-12-07 (×3): 3 mg via ORAL
  Filled 2016-12-05 (×3): qty 1

## 2016-12-05 MED ORDER — DIPHENHYDRAMINE HCL 25 MG PO CAPS
25.0000 mg | ORAL_CAPSULE | Freq: Every evening | ORAL | Status: DC | PRN
  Administered 2016-12-07: 25 mg via ORAL
  Filled 2016-12-05: qty 1

## 2016-12-05 NOTE — Progress Notes (Signed)
PROGRESS NOTE    Cline Draheim  ZOX:[id number] DOB: 12-24-1931 DOA: 12/03/2016 PCP: Geoffry Paradise, MD    Brief Narrative:  83 yo male who presented after suffering a mechanical fall. Patient is known to have bladder outlet obstruction, coronary disease, and chronic kidney disease. Patient fell while on a seated position, patient developed significant hip pain after the fall, unable to stand back up on his feet, patient was brought to the hospital for further evaluation. On the physical examination his blood pressure was 126/79, heart rate 62, respiratory rate 16, oxygen saturation 96%. Moist mucous membranes, lungs clear to auscultation bilaterally, heart S1-S2 present and rhythmic, abdomen soft nontender, no lower extremity edema. Sodium 140, potassium 4.7, chloride 105, bicarbonate 26, glucose 104, BUN 28, creatinine 1.29, white count of 4.5, Hb 10.9, hematocrit 33.5, platelets 134. hest x-ray with signs of hyperinflation, bilateral pleural plaque calcifications. EKG with low voltage, positive PACs and PVCs. Left hip film with comminuted subtrochanteric hip fracture with angulation and displacement of multiple fracture fragments.  Patient admitted to the hospital with working diagnosis of acute left hip fracture.    Assessment & Plan:   Principal Problem:   Closed left hip fracture (HCC) Active Problems:   Hyperlipidemia   CKD (chronic kidney disease), stage III   Hip fracture (HCC)   1. Acute left subtrochanteric fracture. Pain continue with good control, continue dvt prophylaxis. Postponed surgical intervention until Tuesday due to use recent use of ticagrelor.   2. Chronic kidney disease stage III. Renal function with stable serum cr at 1,14 , K at 4,8 and serum bicarbonate at 28.    3. Chronic congestive diastolic heart failure with Ejection fraction with EF 35 to 40%. Stable with no clinical signs of exacerbation, continue medical therapy with   Coreg. Blood pressure systolic  100 to 8119, will continue to hold on ace inh and diuretic to prevent hypotension.   4. Coronary artery disease. sp DES placed in the circumflex coronary, in 01/2016. No angina or dyspnea, will continue b blocker and aspirin, holding on ticagrelor due to high risk of perioperative bleeding. Continue statin therapy with atorvastatin.  5, BPH. Continue flomax.      DVT prophylaxis:  Code Status:  Family Communication:  Disposition Plan:    Consultants:   Orthopedics  Cardiology   Procedures:     Antimicrobials:     Subjective: Reports paresthesias on the right lower extremity, intermittent, mild to moderate, no improving or worsening factors, no radiation or associated symptoms.   Objective: Vitals:   12/04/16 1431 12/04/16 1734 12/04/16 2100 12/05/16 0435  BP: 120/65 102/64 (!) 100/59 118/66  Pulse: 71 72 68 67  Resp: Temp: 98.1 F (36.7 C)  98 F (36.7 C) 98.3 F (36.8 C)  TempSrc: Oral  Oral Oral  SpO2: 97% 97% 96% 96%    Intake/Output Summary (Last 24 hours) at 12/05/16 1134 Last data filed at 12/05/16 0824  Gross per 24 hour  Intake              480 ml  Output              800 ml  Net             -320 ml   There were no vitals filed for this visit.  Examination:  General: Not in pain or dyspnea Neurology: Awake and alert, non focal  E ENT: mild pallor, no icterus, oral mucosa moist Cardiovascular: S1-S2 present,  rhythmic, no gallops, rubs, or murmurs. No jugular venous distention, no lower extremity edema. Pulmonary: vesicular breath sounds bilaterally, adequate air movement, no wheezing, rhonchi or rales. Gastrointestinal. Abdomen flat, no organomegaly, non tender, no rebound or guarding Skin. No rashes Musculoskeletal: no joint deformities     Data Reviewed: I have personally reviewed following labs and imaging studies  CBC:  Recent Labs Lab 12/03/16 1410 12/04/16 0329 12/05/16 0327  WBC 4.5 6.7 7.0  NEUTROABS  --    --  4.4  HGB 10.9* 10.8* 10.7*  HCT 33.5* 34.0* 33.3*  MCV 94.4 95.0 93.5  PLT 134* 136* 143*   Basic Metabolic Panel:  Recent Labs Lab 12/03/16 1410 12/04/16 0329 12/05/16 0327  NA 140 139 138  K 4.7 4.6 4.8  CL 105 103 103  CO2 26 26 28   GLUCOSE 104* 126* 125*  BUN 28* 28* 31*  CREATININE 1.29* 1.16 1.14  CALCIUM 9.0 9.0 8.9   GFR: CrCl cannot be calculated (Unknown ideal weight.). Liver Function Tests: No results for input(s): AST, ALT, ALKPHOS, BILITOT, PROT, ALBUMIN in the last 168 hours. No results for input(s): LIPASE, AMYLASE in the last 168 hours. No results for input(s): AMMONIA in the last 168 hours. Coagulation Profile: No results for input(s): INR, PROTIME in the last 168 hours. Cardiac Enzymes: No results for input(s): CKTOTAL, CKMB, CKMBINDEX, TROPONINI in the last 168 hours. BNP (last 3 results) No results for input(s): PROBNP in the last 8760 hours. HbA1C: No results for input(s): HGBA1C in the last 72 hours. CBG: No results for input(s): GLUCAP in the last 168 hours. Lipid Profile: No results for input(s): CHOL, HDL, LDLCALC, TRIG, CHOLHDL, LDLDIRECT in the last 72 hours. Thyroid Function Tests: No results for input(s): TSH, T4TOTAL, FREET4, T3FREE, THYROIDAB in the last 72 hours. Anemia Panel: No results for input(s): VITAMINB12, FOLATE, FERRITIN, TIBC, IRON, RETICCTPCT in the last 72 hours.    Radiology Studies: I have reviewed all of the imaging during this hospital visit personally     Scheduled Meds: . aspirin EC  81 mg Oral Daily  . atorvastatin  20 mg Oral q1800  . bacitracin  1 application Topical BID  . carvedilol  3.125 mg Oral BID WC  . docusate sodium  100 mg Oral BID  . sodium chloride flush  3 mL Intravenous Q12H  . tamsulosin  0.4 mg Oral Daily   Continuous Infusions: . lactated ringers       LOS: 2 days       Brinden Kincheloe Annett Gula, MD [facility] Pager [phone]

## 2016-12-05 NOTE — Progress Notes (Signed)
    Subjective:  Patient reports pain as mild.  No complaints overnight  Objective:   VITALS:   Vitals:   12/04/16 1431 12/04/16 1734 12/04/16 2100 12/05/16 0435  BP: 120/65 102/64 (!) 100/59 118/66  Pulse: 71 72 68 67  Resp: 16  16 16   Temp: 98.1 F (36.7 C)  98 F (36.7 C) 98.3 F (36.8 C)  TempSrc: Oral  Oral Oral  SpO2: 97% 97% 96% 96%     Left leg/hip -shortened and ER, distally SILT dp/sp/sur/saph/Tib. Motor intact with TA/GSC/FHL/EHL 2+ DP  Lab Results  Component Value Date   WBC 7.0 12/05/2016   HGB 10.7 (L) 12/05/2016   HCT 33.3 (L) 12/05/2016   MCV 93.5 12/05/2016   PLT 143 (L) 12/05/2016   BMET    Component Value Date/Time   NA 138 12/05/2016 0327   NA 132 (A) 02/17/2016   K 4.8 12/05/2016 0327   CL 103 12/05/2016 0327   CO2 28 12/05/2016 0327   GLUCOSE 125 (H) 12/05/2016 0327   BUN 31 (H) 12/05/2016 0327   BUN 37 (A) 02/17/2016   CREATININE 1.14 12/05/2016 0327   CREATININE 1.25 (H) 03/16/2016 1229   CALCIUM 8.9 12/05/2016 0327   GFRNONAA 58 (L) 12/05/2016 0327   GFRAA >60 12/05/2016 0327     Assessment/Plan: * Surgery Date in Future *   Principal Problem:   Closed left hip fracture (HCC) Active Problems:   Hyperlipidemia   CKD (chronic kidney disease), stage III   Hip fracture (HCC)  -Plan for OR Tuesday after appropriate washout of antiplatelet -will need NPO at [place] Monday night -plan discussed with family, and all questions answered    Yolonda Kida 12/05/2016, 9:05 AM   Maryan Rued, MD [phone]

## 2016-12-06 DIAGNOSIS — I5022 Chronic systolic (congestive) heart failure: Secondary | ICD-10-CM

## 2016-12-06 NOTE — Plan of Care (Signed)
Problem: Safety: Goal: Ability to remain free from injury will improve Son at bedside

## 2016-12-06 NOTE — Progress Notes (Signed)
    Subjective: * Surgery Date in Future * Procedure(s) (LRB): INTRAMEDULLARY (IM) NAIL INTERTROCHANTRIC (Left) Patient reports pain as 3 on 0-10 scale.   Denies CP or SOB.  Voiding without difficulty. Positive flatus. Objective: Vital signs in last 24 hours: Temp:  [98.7 F (37.1 C)-98.8 F (37.1 C)] 98.8 F (37.1 C) (09/02 0628) Pulse Rate:  [68-72] 68 (09/02 0628) Resp:  [16] 16 (09/02 0628) BP: (102-111)/(58-61) 111/61 (09/02 0628) SpO2:  [94 %-100 %] 94 % (09/02 0628)  Intake/Output from previous day: 09/01 0701 - 09/02 0700 In: 480 [P.O.:480] Out: 1050 [Urine:1050] Intake/Output this shift: Total I/O In: 240 [P.O.:240] Out: -   Labs:  Recent Labs  12/03/16 1410 12/04/16 0329 12/05/16 0327  HGB 10.9* 10.8* 10.7*    Recent Labs  12/04/16 0329 12/05/16 0327  WBC 6.7 7.0  RBC 3.58* 3.56*  HCT 34.0* 33.3*  PLT 136* 143*    Recent Labs  12/04/16 0329 12/05/16 0327  NA 139 138  K 4.6 4.8  CL 103 103  CO2 26 28  BUN 28* 31*  CREATININE 1.16 1.14  GLUCOSE 126* 125*  CALCIUM 9.0 8.9   No results for input(s): LABPT, INR in the last 72 hours.  Physical Exam: Neurologically intact ABD soft Compartment soft  Assessment/Plan: * Surgery Date in Future * Procedure(s) (LRB): INTRAMEDULLARY (IM) NAIL INTERTROCHANTRIC (Left) Continue care Plan on defintive fracture management in near future  Larry Alcock D for Dr. Venita Lick [facility] [phone] 12/06/2016, 9:57 AM

## 2016-12-06 NOTE — Progress Notes (Signed)
PROGRESS NOTE    Danny Oneill  RUE:[id number] DOB: 1931/11/30 DOA: 12/03/2016 PCP: Geoffry Paradise, MD    Brief Narrative:  83 yo male who presented after suffering a mechanical fall. Patient is known to have bladder outlet obstruction, coronary disease, and chronic kidney disease. Patient fell while on a seated position, patient developed significant hip pain after the fall, unable to stand back up on his feet, patient was brought to the hospital for further evaluation. On the physical examination his blood pressure was 126/79, heart rate 62, respiratory rate 16, oxygen saturation 96%. Moist mucous membranes, lungs clear to auscultation bilaterally, heart S1-S2 present and rhythmic, abdomen soft nontender, no lower extremity edema. Sodium 140, potassium 4.7, chloride 105, bicarbonate 26, glucose 104, BUN 28, creatinine 1.29, white count of 4.5, Hb 10.9, hematocrit 33.5, platelets 134. hest x-ray with signs of hyperinflation, bilateral pleural plaque calcifications. EKG with low voltage, positive PACs and PVCs. Left hip film with comminuted subtrochanteric hip fracture with angulation and displacement of multiple fracture fragments.  Patient admitted to the hospital with working diagnosis of acute left hip fracture.    Assessment & Plan:   Principal Problem:   Closed left hip fracture (HCC) Active Problems:   Hyperlipidemia   CKD (chronic kidney disease), stage III   Hip fracture (HCC)   1. Acute left subtrochanteric fracture. Continue pain control, and dvt prophylaxis. Surgery planned for Tuesday, due to recent use of ticagrelor.   2. Chronic kidney disease stage III. Patient tolerating well po, will check renal function before surgical intervention.     3. Chronic congestive diastolicheart failure with ejection fraction with EF 35 to 40%.  No signs of volume overload, tolerating well b blockade with Coreg. Systolic blood pressure in the low 100's will continue to hold on ace inh and  diuretics to prevent hypotension.   4. Coronary artery disease. DES 01/2016, at the circumflex. Continue medical therapy with asa and statin, holding on ticagrelor.  5, BPH. Continue flomax, no signs of urine retention.       DVT prophylaxis: Code Status: Family Communication: Disposition Plan:   Consultants:  Orthopedics  Cardiology   Procedures:    Antimicrobials:       Subjective: Patient with no pain, no dyspnea, burning sensation on right lower extremity has improved. Has been confused per patient's family at the bedside, no agitation.   Objective: Vitals:   12/05/16 0435 12/05/16 1549 12/05/16 2038 12/06/16 0628  BP: 118/66 (!) 102/58 108/61 111/61  Pulse: 67 69 72 68  Resp: Temp: 98.3 F (36.8 C) 98.8 F (37.1 C) 98.7 F (37.1 C) 98.8 F (37.1 C)  TempSrc: Oral Oral Oral Oral  SpO2: 96% 100% 97% 94%    Intake/Output Summary (Last 24 hours) at 12/06/16 1333 Last data filed at 12/06/16 0844  Gross per 24 hour  Intake              480 ml  Output             1050 ml  Net             -570 ml   There were no vitals filed for this visit.  Examination:  General: Not in pain or dyspnea, deconditioned.  Neurology: Awake and alert, non focal  E ENT: no pallor, no icterus, oral mucosa moist Cardiovascular: S1-S2 present, rhythmic, no gallops, rubs, or murmurs. No jugular venous distention, no lower extremity edema. Pulmonary: vesicular breath sounds bilaterally, adequate  air movement, no wheezing, rhonchi or rales. Gastrointestinal. Abdomen flat, no organomegaly, non tender, no rebound or guarding Skin. No rashes Musculoskeletal: no joint deformities. Right lower extremity on a brace.      Data Reviewed: I have personally reviewed following labs and imaging studies  CBC:  Recent Labs Lab 12/03/16 1410 12/04/16 0329 12/05/16 0327  WBC 4.5 6.7 7.0  NEUTROABS  --   --  4.4  HGB 10.9* 10.8* 10.7*  HCT 33.5* 34.0*  33.3*  MCV 94.4 95.0 93.5  PLT 134* 136* 143*   Basic Metabolic Panel:  Recent Labs Lab 12/03/16 1410 12/04/16 0329 12/05/16 0327  NA 140 139 138  K 4.7 4.6 4.8  CL 105 103 103  CO2 26 26 28   GLUCOSE 104* 126* 125*  BUN 28* 28* 31*  CREATININE 1.29* 1.16 1.14  CALCIUM 9.0 9.0 8.9   GFR: CrCl cannot be calculated (Unknown ideal weight.). Liver Function Tests: No results for input(s): AST, ALT, ALKPHOS, BILITOT, PROT, ALBUMIN in the last 168 hours. No results for input(s): LIPASE, AMYLASE in the last 168 hours. No results for input(s): AMMONIA in the last 168 hours. Coagulation Profile: No results for input(s): INR, PROTIME in the last 168 hours. Cardiac Enzymes: No results for input(s): CKTOTAL, CKMB, CKMBINDEX, TROPONINI in the last 168 hours. BNP (last 3 results) No results for input(s): PROBNP in the last 8760 hours. HbA1C: No results for input(s): HGBA1C in the last 72 hours. CBG: No results for input(s): GLUCAP in the last 168 hours. Lipid Profile: No results for input(s): CHOL, HDL, LDLCALC, TRIG, CHOLHDL, LDLDIRECT in the last 72 hours. Thyroid Function Tests: No results for input(s): TSH, T4TOTAL, FREET4, T3FREE, THYROIDAB in the last 72 hours. Anemia Panel: No results for input(s): VITAMINB12, FOLATE, FERRITIN, TIBC, IRON, RETICCTPCT in the last 72 hours.    Radiology Studies: I have reviewed all of the imaging during this hospital visit personally     Scheduled Meds: . aspirin EC  81 mg Oral Daily  . atorvastatin  20 mg Oral q1800  . bacitracin  1 application Topical BID  . carvedilol  3.125 mg Oral BID WC  . docusate sodium  100 mg Oral BID  . Melatonin  3 mg Oral QHS  . sodium chloride flush  3 mL Intravenous Q12H  . tamsulosin  0.4 mg Oral Daily   Continuous Infusions: . lactated ringers       LOS: 3 days      Lavinia Mcneely Annett Gula, MD [facility] Pager [phone]

## 2016-12-07 ENCOUNTER — Encounter (HOSPITAL_COMMUNITY): Payer: Self-pay | Admitting: General Practice

## 2016-12-07 DIAGNOSIS — G9341 Metabolic encephalopathy: Secondary | ICD-10-CM

## 2016-12-07 MED ORDER — DEXTROSE IN LACTATED RINGERS 5 % IV SOLN
INTRAVENOUS | Status: DC
  Administered 2016-12-07: 75 mL via INTRAVENOUS
  Administered 2016-12-07: 75 mL/h via INTRAVENOUS
  Administered 2016-12-08 – 2016-12-11 (×5): via INTRAVENOUS
  Administered 2016-12-12: 50 mL/h via INTRAVENOUS
  Administered 2016-12-13: 12:00:00 via INTRAVENOUS
  Administered 2016-12-14: 50 mL/h via INTRAVENOUS

## 2016-12-07 NOTE — Progress Notes (Signed)
PROGRESS NOTE    Danny Oneill  GDJ:[id number] DOB: [birth year] DOA: 12/03/2016 PCP: Geoffry Paradise, MD    Brief Narrative:  83 yo male who presented after suffering a mechanical fall. Patient is known to have bladder outlet obstruction, coronary disease, and chronic kidney disease. Patient fell while on a seated position, patient developed significant hip pain after the fall, unable to stand back up on his feet, patient was brought to the hospital for further evaluation. On the physical examination his blood pressure was 126/79, heart rate 62, respiratory rate 16, oxygen saturation 96%. Moist mucous membranes, lungs clear to auscultation bilaterally, heart S1-S2 present and rhythmic, abdomen soft nontender, no lower extremity edema. Sodium 140, potassium 4.7, chloride 105, bicarbonate 26, glucose 104, BUN 28, creatinine 1.29, white count of 4.5, Hb 10.9, hematocrit 33.5, platelets 134. hest x-ray with signs of hyperinflation, bilateral pleural plaque calcifications. EKG with low voltage, positive PACs and PVCs. Left hip film with comminuted subtrochanteric hip fracture with angulation and displacement of multiple fracture fragments.  Patient admitted to the hospital with working diagnosis of acute left hip fracture.    Assessment & Plan:   Principal Problem:   Closed left hip fracture (HCC) Active Problems:   Hyperlipidemia   CKD (chronic kidney disease), stage III   Hip fracture (HCC)   1. Acute left subtrochanteric fracture. Will continue pain control, anddvt prophylaxis. Surgery planned for tomorrow, continue to hold on ticagrelor, due to risk of bleeding.  2. Chronic kidney disease stage III. Patient clinically dry, will add gentle IV fluids for hydration, with balanced electrolyte solutions. Will follow renal function in am. Avoid hypotension or nephrotoxic medications.   3. Chronic congestive diastolicheart failure with ejection fraction with EF 35 to 40%.Chronic and stable,  today looks hypovolemic, will add gentle IV fluids and will continue b blockade withCoreg. No diuretics or ace inh due to risk for hypotension.   4. Coronary artery disease. DES 01/2016, at the circumflex. No angina. Continue medical therapy with asa and statin, holding on ticagrelor due to risk of bleeding.  5, BPH. On flomax, no signs of urine retention.    6. NEW Metabolic encephalopathy. Patient getting more confused and disorientates, has high risk for delirium, will continue supportive medical therapy, hydration with IV fluids + dextrose, add neuro checks q 4 hours. Patient's family at the bedside.  DVT prophylaxis:scd Code Status:full Family Communication:I spoke with patient's son at the bedside Disposition Plan:Home or snf   Consultants:  Orthopedics  Cardiology   Procedures:    Antimicrobials:      Subjective: Patient has developed worsening confusion, no agitation, no nausea or vomiting. Pain at the right lower extremity, difficult to assess due to patient's confusion.   Objective: Vitals:   12/06/16 0628 12/06/16 1506 12/06/16 2026 12/07/16 0449  BP: 111/61 (!) 106/58 (!) 96/46 111/60  Pulse: 68 66 65 66  Resp: 16 16 16 16   Temp: 98.8 F (37.1 C) 97.9 F (36.6 C) 99 F (37.2 C) 98 F (36.7 C)  TempSrc: Oral Oral Oral Oral  SpO2: 94% 99% 96% 97%  Weight:   84.9 kg (187 lb 3.2 oz) 87.8 kg (193 lb 8 oz)    Intake/Output Summary (Last 24 hours) at 12/07/16 1148 Last data filed at 12/07/16 0829  Gross per 24 hour  Intake              240 ml  Output              650  ml  Net             -410 ml   Filed Weights   12/06/16 2026 12/07/16 0449  Weight: 84.9 kg (187 lb 3.2 oz) 87.8 kg (193 lb 8 oz)    Examination:  General:deconditioned and ill looking appearing Neurology: Confused and disorientated, non focal  E ENT: positive pallor, no icterus, oral mucosa dry Cardiovascular: S1-S2 present, rhythmic, no gallops, rubs, or murmurs.  No jugular venous distention, no lower extremity edema. Pulmonary: vesicular breath sounds bilaterally, adequate air movement, no wheezing, rhonchi or rales. Gastrointestinal. Abdomen flat, no organomegaly, non tender, no rebound or guarding Skin. No rashes Musculoskeletal: no joint deformities     Data Reviewed: I have personally reviewed following labs and imaging studies  CBC:  Recent Labs Lab 12/03/16 1410 12/04/16 0329 12/05/16 0327  WBC 4.5 6.7 7.0  NEUTROABS  --   --  4.4  HGB 10.9* 10.8* 10.7*  HCT 33.5* 34.0* 33.3*  MCV 94.4 95.0 93.5  PLT 134* 136* 143*   Basic Metabolic Panel:  Recent Labs Lab 12/03/16 1410 12/04/16 0329 12/05/16 0327  NA 140 139 138  K 4.7 4.6 4.8  CL 105 103 103  CO2 GLUCOSE 104* 126* 125*  BUN 28* 28* 31*  CREATININE 1.29* 1.16 1.14  CALCIUM 9.0 9.0 8.9   GFR: Estimated Creatinine Clearance: 57.1 mL/min (by C-G formula based on SCr of 1.14 mg/dL). Liver Function Tests: No results for input(s): AST, ALT, ALKPHOS, BILITOT, PROT, ALBUMIN in the last 168 hours. No results for input(s): LIPASE, AMYLASE in the last 168 hours. No results for input(s): AMMONIA in the last 168 hours. Coagulation Profile: No results for input(s): INR, PROTIME in the last 168 hours. Cardiac Enzymes: No results for input(s): CKTOTAL, CKMB, CKMBINDEX, TROPONINI in the last 168 hours. BNP (last 3 results) No results for input(s): PROBNP in the last 8760 hours. HbA1C: No results for input(s): HGBA1C in the last 72 hours. CBG: No results for input(s): GLUCAP in the last 168 hours. Lipid Profile: No results for input(s): CHOL, HDL, LDLCALC, TRIG, CHOLHDL, LDLDIRECT in the last 72 hours. Thyroid Function Tests: No results for input(s): TSH, T4TOTAL, FREET4, T3FREE, THYROIDAB in the last 72 hours. Anemia Panel: No results for input(s): VITAMINB12, FOLATE, FERRITIN, TIBC, IRON, RETICCTPCT in the last 72 hours.    Radiology Studies: I have  reviewed all of the imaging during this hospital visit personally     Scheduled Meds: . aspirin EC  81 mg Oral Daily  . atorvastatin  20 mg Oral q1800  . bacitracin  1 application Topical BID  . carvedilol  3.125 mg Oral BID WC  . docusate sodium  100 mg Oral BID  . Melatonin  3 mg Oral QHS  . sodium chloride flush  3 mL Intravenous Q12H  . tamsulosin  0.4 mg Oral Daily   Continuous Infusions: . lactated ringers       LOS: 4 days     Danny Hinshaw Annett Gula, MD [facility] Pager [phone]

## 2016-12-07 NOTE — Progress Notes (Signed)
   Subjective: Left femur fracture Surgery scheduled for tomorrow Son states the pain meds are making him 'loopy' Denies any new symptoms or issues, just pain with movement of the left leg Patient reports pain as moderate.  Objective:   VITALS:   Vitals:   12/06/16 2026 12/07/16 0449  BP: (!) 96/46 111/60  Pulse: 65 66  Resp: 16 16  Temp: 99 F (37.2 C) 98 F (36.7 C)  SpO2: 96% 97%    Left lower extremity with external rotation and shortening nv intact distally No rashes or edema distally  LABS  Recent Labs  12/05/16 0327  HGB 10.7*  HCT 33.3*  WBC 7.0  PLT 143*     Recent Labs  12/05/16 0327  NA 138  K 4.8  BUN 31*  CREATININE 1.14  GLUCOSE 125*     Assessment/Plan: Left femur fracture Plan for surgery tomorrow with Dr. Aundria Rud NPO after midnight  Pain management as needed Strict bedrest    Alphonsa Overall, MPAS, PA-C  12/07/2016, 10:26 AM

## 2016-12-07 NOTE — Plan of Care (Signed)
Problem: Safety: Goal: Ability to remain free from injury will improve Outcome: Progressing Family at bedside. Calls for assistance when needed

## 2016-12-07 NOTE — Care Management Important Message (Signed)
Important Message  Patient Details  Name: Danny Oneill MRN: [id number] Date of Birth: 1931/12/07   Medicare Important Message Given:  Yes    [name] 12/07/2016, 11:23 AM

## 2016-12-08 ENCOUNTER — Inpatient Hospital Stay (HOSPITAL_COMMUNITY): Payer: PPO | Admitting: Anesthesiology

## 2016-12-08 ENCOUNTER — Encounter (HOSPITAL_COMMUNITY)
Admission: EM | Disposition: A | Discharge status: Skilled Nursing Facility | Payer: Self-pay | Source: Home / Self Care | Attending: Pulmonary Disease

## 2016-12-08 ENCOUNTER — Inpatient Hospital Stay (HOSPITAL_COMMUNITY): Payer: PPO

## 2016-12-08 ENCOUNTER — Encounter (HOSPITAL_COMMUNITY): Payer: Self-pay | Admitting: Certified Registered Nurse Anesthetist

## 2016-12-08 DIAGNOSIS — I959 Hypotension, unspecified: Secondary | ICD-10-CM

## 2016-12-08 HISTORY — PX: INTRAMEDULLARY (IM) NAIL INTERTROCHANTERIC: SHX5875

## 2016-12-08 LAB — CBC WITH DIFFERENTIAL/PLATELET
Basophils Absolute: 0 10*3/uL (ref 0.0–0.1)
Basophils Relative: 1 %
Eosinophils Absolute: 0 10*3/uL (ref 0.0–0.7)
Eosinophils Relative: 2 %
HEMATOCRIT: 21.5 % — AB (ref 39.0–52.0)
HEMOGLOBIN: 7.1 g/dL — AB (ref 13.0–17.0)
LYMPHS ABS: 0.3 10*3/uL — AB (ref 0.7–4.0)
LYMPHS PCT: 20 %
MCH: 31 pg (ref 26.0–34.0)
MCHC: 33 g/dL (ref 30.0–36.0)
MCV: 93.9 fL (ref 78.0–100.0)
Monocytes Absolute: 0 10*3/uL — ABNORMAL LOW (ref 0.1–1.0)
Monocytes Relative: 1 %
NEUTROS ABS: 1 10*3/uL — AB (ref 1.7–7.7)
NEUTROS PCT: 77 %
Platelets: UNDETERMINED 10*3/uL (ref 150–400)
RBC: 2.29 MIL/uL — AB (ref 4.22–5.81)
RDW: 15.7 % — ABNORMAL HIGH (ref 11.5–15.5)
WBC MORPHOLOGY: INCREASED
WBC: 1.3 10*3/uL — AB (ref 4.0–10.5)

## 2016-12-08 LAB — URINALYSIS, COMPLETE (UACMP) WITH MICROSCOPIC
Bilirubin Urine: NEGATIVE
Glucose, UA: NEGATIVE mg/dL
Ketones, ur: NEGATIVE mg/dL
Nitrite: NEGATIVE
Protein, ur: 100 mg/dL — AB
SPECIFIC GRAVITY, URINE: 1.012 (ref 1.005–1.030)
Squamous Epithelial / LPF: NONE SEEN
pH: 5 (ref 5.0–8.0)

## 2016-12-08 LAB — HEMOGLOBIN AND HEMATOCRIT, BLOOD
HCT: 29.2 % — ABNORMAL LOW (ref 39.0–52.0)
Hemoglobin: 9.4 g/dL — ABNORMAL LOW (ref 13.0–17.0)

## 2016-12-08 LAB — POCT I-STAT 3, ART BLOOD GAS (G3+)
ACID-BASE DEFICIT: 7 mmol/L — AB (ref 0.0–2.0)
Bicarbonate: 19.9 mmol/L — ABNORMAL LOW (ref 20.0–28.0)
O2 Saturation: 100 %
PH ART: 7.276 — AB (ref 7.350–7.450)
PO2 ART: 383 mmHg — AB (ref 83.0–108.0)
Patient temperature: 97.6
TCO2: 21 mmol/L — ABNORMAL LOW (ref 22–32)
pCO2 arterial: 42.4 mmHg (ref 32.0–48.0)

## 2016-12-08 LAB — BASIC METABOLIC PANEL
ANION GAP: 8 (ref 5–15)
BUN: 32 mg/dL — ABNORMAL HIGH (ref 6–20)
CALCIUM: 8.5 mg/dL — AB (ref 8.9–10.3)
CO2: 26 mmol/L (ref 22–32)
Chloride: 102 mmol/L (ref 101–111)
Creatinine, Ser: 1.02 mg/dL (ref 0.61–1.24)
GFR calc non Af Amer: >60 mL/min (ref >60)
GLUCOSE: 121 mg/dL — AB (ref 65–99)
Potassium: 4.4 mmol/L (ref 3.5–5.1)
Sodium: 136 mmol/L (ref 135–145)

## 2016-12-08 LAB — RENAL FUNCTION PANEL
ALBUMIN: 2.1 g/dL — AB (ref 3.5–5.0)
ANION GAP: 13 (ref 5–15)
BUN: 41 mg/dL — ABNORMAL HIGH (ref 6–20)
CALCIUM: 7.6 mg/dL — AB (ref 8.9–10.3)
CO2: 17 mmol/L — ABNORMAL LOW (ref 22–32)
Chloride: 108 mmol/L (ref 101–111)
Creatinine, Ser: 1.57 mg/dL — ABNORMAL HIGH (ref 0.61–1.24)
GFR calc Af Amer: 45 mL/min — ABNORMAL LOW (ref >60)
GFR, EST NON AFRICAN AMERICAN: 39 mL/min — AB (ref >60)
Glucose, Bld: 65 mg/dL (ref 65–99)
PHOSPHORUS: 4.6 mg/dL (ref 2.5–4.6)
POTASSIUM: 3.8 mmol/L (ref 3.5–5.1)
SODIUM: 138 mmol/L (ref 135–145)

## 2016-12-08 LAB — PROTIME-INR
INR: 2.34
PROTHROMBIN TIME: 25.5 s — AB (ref 11.4–15.2)

## 2016-12-08 LAB — LACTIC ACID, PLASMA: Lactic Acid, Venous: 6.7 mmol/L (ref 0.5–1.9)

## 2016-12-08 LAB — GLUCOSE, CAPILLARY: Glucose-Capillary: 92 mg/dL (ref 65–99)

## 2016-12-08 LAB — MAGNESIUM: MAGNESIUM: 1.6 mg/dL — AB (ref 1.7–2.4)

## 2016-12-08 LAB — TROPONIN I: Troponin I: 0.04 ng/mL (ref <0.03)

## 2016-12-08 SURGERY — FIXATION, FRACTURE, INTERTROCHANTERIC, WITH INTRAMEDULLARY ROD
Anesthesia: Spinal | Site: Hip | Laterality: Left

## 2016-12-08 MED ORDER — ROCURONIUM BROMIDE 10 MG/ML (PF) SYRINGE
PREFILLED_SYRINGE | INTRAVENOUS | Status: AC
  Filled 2016-12-08: qty 5

## 2016-12-08 MED ORDER — ALBUMIN HUMAN 5 % IV SOLN
INTRAVENOUS | Status: AC
  Filled 2016-12-08: qty 250

## 2016-12-08 MED ORDER — PROPOFOL 10 MG/ML IV BOLUS
INTRAVENOUS | Status: DC | PRN
  Administered 2016-12-08: 70 mg via INTRAVENOUS

## 2016-12-08 MED ORDER — METOCLOPRAMIDE HCL 10 MG PO TABS: 5.0000 mg | ORAL_TABLET | Freq: Three times a day (TID) | ORAL | Status: DC | PRN

## 2016-12-08 MED ORDER — ONDANSETRON HCL 4 MG/2ML IJ SOLN: 4.0000 mg | Freq: Four times a day (QID) | INTRAMUSCULAR | Status: DC | PRN

## 2016-12-08 MED ORDER — FENTANYL CITRATE (PF) 250 MCG/5ML IJ SOLN
INTRAMUSCULAR | Status: AC
  Filled 2016-12-08: qty 5

## 2016-12-08 MED ORDER — HALOPERIDOL LACTATE 5 MG/ML IJ SOLN
1.0000 mg | INTRAMUSCULAR | Status: DC
  Administered 2016-12-08 (×2): 1 mg via INTRAMUSCULAR
  Filled 2016-12-08 (×2): qty 1

## 2016-12-08 MED ORDER — ORAL CARE MOUTH RINSE
15.0000 mL | Freq: Four times a day (QID) | OROMUCOSAL | Status: DC
  Administered 2016-12-09 – 2016-12-11 (×10): 15 mL via OROMUCOSAL

## 2016-12-08 MED ORDER — DOCUSATE SODIUM 50 MG/5ML PO LIQD: 100.0000 mg | Freq: Two times a day (BID) | ORAL | Status: DC | PRN

## 2016-12-08 MED ORDER — SUGAMMADEX SODIUM 200 MG/2ML IV SOLN
INTRAVENOUS | Status: DC | PRN
  Administered 2016-12-08: 200 mg via INTRAVENOUS

## 2016-12-08 MED ORDER — MEPERIDINE HCL 25 MG/ML IJ SOLN: 6.2500 mg | INTRAMUSCULAR | Status: DC | PRN

## 2016-12-08 MED ORDER — EPINEPHRINE PF 1 MG/ML IJ SOLN
0.5000 ug/min | INTRAVENOUS | Status: DC
  Filled 2016-12-08 (×2): qty 4

## 2016-12-08 MED ORDER — HALOPERIDOL LACTATE 5 MG/ML IJ SOLN
10.0000 mg | Freq: Once | INTRAMUSCULAR | Status: AC
  Administered 2016-12-08: 10 mg via INTRAVENOUS
  Filled 2016-12-08: qty 2

## 2016-12-08 MED ORDER — CHLORHEXIDINE GLUCONATE 0.12% ORAL RINSE (MEDLINE KIT)
15.0000 mL | Freq: Two times a day (BID) | OROMUCOSAL | Status: DC
  Administered 2016-12-09 – 2016-12-11 (×6): 15 mL via OROMUCOSAL

## 2016-12-08 MED ORDER — FENTANYL CITRATE (PF) 100 MCG/2ML IJ SOLN
INTRAMUSCULAR | Status: DC | PRN
  Administered 2016-12-08: 50 ug via INTRAVENOUS
  Administered 2016-12-08: 100 ug via INTRAVENOUS

## 2016-12-08 MED ORDER — CEFAZOLIN SODIUM 1 G IJ SOLR
INTRAMUSCULAR | Status: AC
  Filled 2016-12-08: qty 20

## 2016-12-08 MED ORDER — ACETAMINOPHEN 650 MG RE SUPP: 650.0000 mg | Freq: Four times a day (QID) | RECTAL | Status: DC | PRN

## 2016-12-08 MED ORDER — SODIUM CHLORIDE 0.9 % IV SOLN
Freq: Once | INTRAVENOUS | Status: DC
  Administered 2016-12-08: 22:00:00 via INTRAVENOUS

## 2016-12-08 MED ORDER — HALOPERIDOL 1 MG PO TABS
1.0000 mg | ORAL_TABLET | ORAL | Status: DC
  Filled 2016-12-08 (×3): qty 1

## 2016-12-08 MED ORDER — ONDANSETRON HCL 4 MG/2ML IJ SOLN
INTRAMUSCULAR | Status: AC
  Filled 2016-12-08: qty 2

## 2016-12-08 MED ORDER — FENTANYL CITRATE (PF) 100 MCG/2ML IJ SOLN
50.0000 ug | INTRAMUSCULAR | Status: DC | PRN
  Administered 2016-12-10: 50 ug via INTRAVENOUS
  Filled 2016-12-08: qty 2

## 2016-12-08 MED ORDER — PHENYLEPHRINE HCL 10 MG/ML IJ SOLN
INTRAMUSCULAR | Status: DC | PRN
  Administered 2016-12-08 (×2): 80 ug via INTRAVENOUS

## 2016-12-08 MED ORDER — FENTANYL CITRATE (PF) 100 MCG/2ML IJ SOLN
25.0000 ug | INTRAMUSCULAR | Status: DC | PRN
  Administered 2016-12-08: 25 ug via INTRAVENOUS
  Administered 2016-12-08: 50 ug via INTRAVENOUS

## 2016-12-08 MED ORDER — 0.9 % SODIUM CHLORIDE (POUR BTL) OPTIME
TOPICAL | Status: DC | PRN
  Administered 2016-12-08: 1000 mL

## 2016-12-08 MED ORDER — SODIUM CHLORIDE 0.9 % IV SOLN
20.0000 ug | Freq: Once | INTRAVENOUS | Status: AC
  Administered 2016-12-08: 20 ug via INTRAVENOUS
  Filled 2016-12-08: qty 5

## 2016-12-08 MED ORDER — CEFAZOLIN SODIUM-DEXTROSE 2-3 GM-% IV SOLR
INTRAVENOUS | Status: DC | PRN
  Administered 2016-12-08: 2 g via INTRAVENOUS

## 2016-12-08 MED ORDER — PROMETHAZINE HCL 25 MG/ML IJ SOLN: 6.2500 mg | INTRAMUSCULAR | Status: DC | PRN

## 2016-12-08 MED ORDER — LIDOCAINE 2% (20 MG/ML) 5 ML SYRINGE
INTRAMUSCULAR | Status: AC
  Filled 2016-12-08: qty 5

## 2016-12-08 MED ORDER — FENTANYL CITRATE (PF) 100 MCG/2ML IJ SOLN
INTRAMUSCULAR | Status: AC
  Filled 2016-12-08: qty 2

## 2016-12-08 MED ORDER — NOREPINEPHRINE BITARTRATE 1 MG/ML IV SOLN
0.0000 ug/min | INTRAVENOUS | Status: DC
  Administered 2016-12-08: 15 ug/min via INTRAVENOUS
  Administered 2016-12-09: 18 ug/min via INTRAVENOUS
  Filled 2016-12-08 (×3): qty 4

## 2016-12-08 MED ORDER — ONDANSETRON HCL 4 MG PO TABS: 4.0000 mg | ORAL_TABLET | Freq: Four times a day (QID) | ORAL | Status: DC | PRN

## 2016-12-08 MED ORDER — ALBUMIN HUMAN 5 % IV SOLN
12.5000 g | Freq: Once | INTRAVENOUS | Status: AC
  Administered 2016-12-08: 12.5 g via INTRAVENOUS

## 2016-12-08 MED ORDER — BISACODYL 10 MG RE SUPP: 10.0000 mg | Freq: Every day | RECTAL | Status: DC | PRN

## 2016-12-08 MED ORDER — PHENYLEPHRINE HCL 10 MG/ML IJ SOLN
INTRAVENOUS | Status: DC | PRN
  Administered 2016-12-08: 25 ug/min via INTRAVENOUS

## 2016-12-08 MED ORDER — SUGAMMADEX SODIUM 200 MG/2ML IV SOLN
INTRAVENOUS | Status: AC
  Filled 2016-12-08: qty 2

## 2016-12-08 MED ORDER — FENTANYL CITRATE (PF) 100 MCG/2ML IJ SOLN
INTRAMUSCULAR | Status: AC
  Administered 2016-12-08: 50 ug via INTRAVENOUS
  Filled 2016-12-08: qty 2

## 2016-12-08 MED ORDER — METOCLOPRAMIDE HCL 5 MG/ML IJ SOLN: 5.0000 mg | Freq: Three times a day (TID) | INTRAMUSCULAR | Status: DC | PRN

## 2016-12-08 MED ORDER — SODIUM CHLORIDE 0.9 % IV SOLN
0.0000 ug/min | INTRAVENOUS | Status: DC
  Filled 2016-12-08: qty 1

## 2016-12-08 MED ORDER — LIDOCAINE HCL (CARDIAC) 20 MG/ML IV SOLN
INTRAVENOUS | Status: DC | PRN
  Administered 2016-12-08: 100 mg via INTRAVENOUS

## 2016-12-08 MED ORDER — DEXTROSE 5 % IV SOLN
1.0000 g | INTRAVENOUS | Status: DC
  Administered 2016-12-08 – 2016-12-11 (×4): 1 g via INTRAVENOUS
  Filled 2016-12-08 (×4): qty 10

## 2016-12-08 MED ORDER — CEFAZOLIN SODIUM-DEXTROSE 1-4 GM/50ML-% IV SOLN
1.0000 g | Freq: Four times a day (QID) | INTRAVENOUS | Status: AC
  Administered 2016-12-09 (×3): 1 g via INTRAVENOUS
  Filled 2016-12-08 (×4): qty 50

## 2016-12-08 MED ORDER — ROCURONIUM BROMIDE 100 MG/10ML IV SOLN
INTRAVENOUS | Status: DC | PRN
  Administered 2016-12-08: 30 mg via INTRAVENOUS

## 2016-12-08 MED ORDER — ACETAMINOPHEN 325 MG PO TABS: 650.0000 mg | ORAL_TABLET | Freq: Four times a day (QID) | ORAL | Status: DC | PRN

## 2016-12-08 MED ORDER — PROPOFOL 10 MG/ML IV BOLUS
INTRAVENOUS | Status: AC
  Filled 2016-12-08: qty 20

## 2016-12-08 MED ORDER — PHENYLEPHRINE 40 MCG/ML (10ML) SYRINGE FOR IV PUSH (FOR BLOOD PRESSURE SUPPORT)
PREFILLED_SYRINGE | INTRAVENOUS | Status: AC
  Filled 2016-12-08: qty 10

## 2016-12-08 MED ORDER — MIDAZOLAM HCL 2 MG/2ML IJ SOLN
1.0000 mg | INTRAMUSCULAR | Status: DC | PRN
  Administered 2016-12-10: 1 mg via INTRAVENOUS
  Filled 2016-12-08: qty 2

## 2016-12-08 MED ORDER — PANTOPRAZOLE SODIUM 40 MG IV SOLR
40.0000 mg | Freq: Every day | INTRAVENOUS | Status: DC
  Administered 2016-12-09: 40 mg via INTRAVENOUS
  Filled 2016-12-08: qty 40

## 2016-12-08 SURGICAL SUPPLY — 45 items
BIT DRILL CANN LRG QC 17.0X300 (BIT) ×3 IMPLANT
BIT DRILL FLUTED FEMUR 4.2/3 (BIT) ×3 IMPLANT
BLADE SURG 15 STRL LF DISP TIS (BLADE) ×1 IMPLANT
BLADE SURG 15 STRL SS (BLADE) ×3
BNDG COHESIVE 4X5 TAN NS LF (GAUZE/BANDAGES/DRESSINGS) IMPLANT
BNDG COHESIVE 6X5 TAN STRL LF (GAUZE/BANDAGES/DRESSINGS) ×3 IMPLANT
BNDG GAUZE ELAST 4 BULKY (GAUZE/BANDAGES/DRESSINGS) IMPLANT
CANISTER SUCTION WELLS/JOHNSON (MISCELLANEOUS) ×3 IMPLANT
COVER PERINEAL POST (MISCELLANEOUS) ×3 IMPLANT
COVER SURGICAL LIGHT HANDLE (MISCELLANEOUS) ×3 IMPLANT
DRAPE HALF SHEET 40X57 (DRAPES) ×6 IMPLANT
DRAPE STERI IOBAN 125X83 (DRAPES) ×3 IMPLANT
DRSG MEPILEX BORDER 4X4 (GAUZE/BANDAGES/DRESSINGS) ×3 IMPLANT
DRSG MEPILEX BORDER 4X8 (GAUZE/BANDAGES/DRESSINGS) ×3 IMPLANT
DRSG PAD ABDOMINAL 8X10 ST (GAUZE/BANDAGES/DRESSINGS) ×6 IMPLANT
DURAPREP 26ML APPLICATOR (WOUND CARE) ×3 IMPLANT
ELECT CAUTERY BLADE 6.4 (BLADE) ×3 IMPLANT
ELECT REM PT RETURN 9FT ADLT (ELECTROSURGICAL) ×3
ELECTRODE REM PT RTRN 9FT ADLT (ELECTROSURGICAL) ×1 IMPLANT
FACESHIELD WRAPAROUND (MASK) IMPLANT
GAUZE SPONGE 4X4 12PLY STRL (GAUZE/BANDAGES/DRESSINGS) ×3 IMPLANT
GAUZE XEROFORM 5X9 LF (GAUZE/BANDAGES/DRESSINGS) ×3 IMPLANT
GLOVE BIO SURGEON STRL SZ7.5 (GLOVE) ×6 IMPLANT
GLOVE BIOGEL PI IND STRL 8 (GLOVE) ×1 IMPLANT
GLOVE BIOGEL PI INDICATOR 8 (GLOVE) ×2
GOWN STRL REUS W/ TWL LRG LVL3 (GOWN DISPOSABLE) ×2 IMPLANT
GOWN STRL REUS W/ TWL XL LVL3 (GOWN DISPOSABLE) ×1 IMPLANT
GOWN STRL REUS W/TWL LRG LVL3 (GOWN DISPOSABLE) ×6
GOWN STRL REUS W/TWL XL LVL3 (GOWN DISPOSABLE) ×3
KIT BASIN OR (CUSTOM PROCEDURE TRAY) ×3 IMPLANT
KIT ROOM TURNOVER OR (KITS) ×3 IMPLANT
LINER BOOT UNIVERSAL DISP (MISCELLANEOUS) IMPLANT
NAIL TROCH FIX 10X235 LT 130 (Nail) ×3 IMPLANT
NS IRRIG 1000ML POUR BTL (IV SOLUTION) ×3 IMPLANT
PACK GENERAL/GYN (CUSTOM PROCEDURE TRAY) ×3 IMPLANT
PAD ARMBOARD 7.5X6 YLW CONV (MISCELLANEOUS) ×6 IMPLANT
PAD CAST 4YDX4 CTTN HI CHSV (CAST SUPPLIES) IMPLANT
PADDING CAST COTTON 4X4 STRL (CAST SUPPLIES)
SCREW LOCK T25 FT 36X5X4.3X (Screw) ×1 IMPLANT
SCREW LOCKING 5.0X36MM (Screw) ×3 IMPLANT
SCREW TFNA 105MM STERILE (Screw) ×3 IMPLANT
STAPLER VISISTAT 35W (STAPLE) ×3 IMPLANT
SUT MON AB 2-0 CT1 36 (SUTURE) ×3 IMPLANT
TOWEL OR 17X24 6PK STRL BLUE (TOWEL DISPOSABLE) ×3 IMPLANT
TOWEL OR 17X26 10 PK STRL BLUE (TOWEL DISPOSABLE) ×3 IMPLANT

## 2016-12-08 NOTE — Progress Notes (Signed)
Report received from [facility].  MD [place] at bedside.  Simple mask applied at 10L.  Pt laying flat and O2 sats dipping in to the upper 80's.

## 2016-12-08 NOTE — Anesthesia Procedure Notes (Signed)
Procedure Name: Intubation Date/Time: 12/08/2016 3:58 PM Performed by: Candis Shine Pre-anesthesia Checklist: Patient identified, Emergency Drugs available, Suction available and Patient being monitored Patient Re-evaluated:Patient Re-evaluated prior to induction Oxygen Delivery Method: Circle System Utilized Preoxygenation: Pre-oxygenation with 100% oxygen Induction Type: IV induction Ventilation: Mask ventilation without difficulty Laryngoscope Size: Mac and 3 Grade View: Grade I Tube type: Oral Tube size: 7.5 mm Number of attempts: 1 Airway Equipment and Method: Stylet Placement Confirmation: ETT inserted through vocal cords under direct vision,  positive ETCO2 and breath sounds checked- equal and bilateral Secured at: 22 cm Tube secured with: Tape Dental Injury: Teeth and Oropharynx as per pre-operative assessment

## 2016-12-08 NOTE — Anesthesia Preprocedure Evaluation (Addendum)
Anesthesia Evaluation  Patient identified by MRN, date of birth, ID band Patient awake    Reviewed: Allergy & Precautions, H&P , NPO status , Patient's Chart, lab work & pertinent test results  Airway Mallampati: II  TM Distance: >3 FB Neck ROM: Full    Dental  (+) Dental Advisory Given   Pulmonary shortness of breath,    breath sounds clear to auscultation       Cardiovascular + CAD, + Past MI and +CHF  + dysrhythmias  Rhythm:Regular Rate:Normal     Neuro/Psych negative neurological ROS  negative psych ROS   GI/Hepatic negative GI ROS, Neg liver ROS,   Endo/Other  negative endocrine ROS  Renal/GU Renal disease     Musculoskeletal  (+) Arthritis ,   Abdominal   Peds  Hematology negative hematology ROS (+) anemia ,   Anesthesia Other Findings   Reproductive/Obstetrics negative OB ROS                             Anesthesia Physical  Anesthesia Plan  ASA: IV  Anesthesia Plan: General   Post-op Pain Management:    Induction: Intravenous  PONV Risk Score and Plan: 3 and Ondansetron and Treatment may vary due to age or medical condition  Airway Management Planned: Oral ETT  Additional Equipment:   Intra-op Plan:   Post-operative Plan: Extubation in OR  Informed Consent: I have reviewed the patients History and Physical, chart, labs and discussed the procedure including the risks, benefits and alternatives for the proposed anesthesia with the patient or authorized representative who has indicated his/her understanding and acceptance.   Dental advisory given  Plan Discussed with: CRNA  Anesthesia Plan Comments:       Anesthesia Quick Evaluation

## 2016-12-08 NOTE — Anesthesia Procedure Notes (Signed)
Arterial Line Insertion Performed by: [name]  Patient location: Pre-op. Preanesthetic checklist: patient identified, IV checked, site marked, risks and benefits discussed, surgical consent, monitors and equipment checked, pre-op evaluation, timeout performed and anesthesia consent Lidocaine 1% used for infiltration Left, radial was placed Catheter size: 20 Fr Hand hygiene performed  and maximum sterile barriers used   Attempts: 1 Procedure performed using ultrasound guided technique. Following insertion, Biopatch. Post procedure assessment: normal and unchanged  Patient tolerated the procedure well with no immediate complications.

## 2016-12-08 NOTE — Progress Notes (Signed)
Levophed started.  NT gone to get emergency PRBC's.  Type and screen sent.  MD still present at bedside, and CRNA.

## 2016-12-08 NOTE — H&P (Signed)
H&P update  The surgical history has been reviewed and remains accurate without interval change.  The patient was re-examined and patient's physiologic condition has not changed significantly in the last 30 days. The condition still exists that makes this procedure necessary. The treatment plan remains the same, without new options for care.  No new pharmacological allergies or types of therapy has been initiated that would change the plan or the appropriateness of the plan.  The family understand the potential benefits and risks.  The patient has unfortunately developed some hospital delirium over the last days and that continues to be a problem.  The family understands that anesthesia may make this worse.    Jason P. Aundria Rud, MD 12/08/2016 3:27 PM

## 2016-12-08 NOTE — Progress Notes (Signed)
Dr. Renold Don still present at bedside attempting an A line.  Phenylephrine started by CRNA.  BP remains low.

## 2016-12-08 NOTE — Anesthesia Preprocedure Evaluation (Signed)
Anesthesia Evaluation  Patient identified by MRN, date of birth, ID band Patient awake    Reviewed: Allergy & Precautions, H&P , NPO status , Patient's Chart, lab work & pertinent test results, reviewed documented beta blocker date and time   Airway Mallampati: II  TM Distance: >3 FB Neck ROM: Full    Dental  (+) Dental Advisory Given   Pulmonary shortness of breath,    breath sounds clear to auscultation       Cardiovascular Pt. on home beta blockers + CAD, + Past MI and +CHF  + dysrhythmias  Rhythm:Regular Rate:Normal     Neuro/Psych negative neurological ROS  negative psych ROS   GI/Hepatic negative GI ROS, Neg liver ROS,   Endo/Other  negative endocrine ROS  Renal/GU Renal disease     Musculoskeletal  (+) Arthritis ,   Abdominal   Peds  Hematology  (+) anemia ,   Anesthesia Other Findings   Reproductive/Obstetrics negative OB ROS                             Anesthesia Physical  Anesthesia Plan  ASA: II  Anesthesia Plan: Spinal   Post-op Pain Management:    Induction:   PONV Risk Score and Plan:   Airway Management Planned:   Additional Equipment:   Intra-op Plan:   Post-operative Plan:   Informed Consent: I have reviewed the patients History and Physical, chart, labs and discussed the procedure including the risks, benefits and alternatives for the proposed anesthesia with the patient or authorized representative who has indicated his/her understanding and acceptance.   Dental advisory given  Plan Discussed with: CRNA  Anesthesia Plan Comments:         Anesthesia Quick Evaluation

## 2016-12-08 NOTE — Consult Note (Signed)
PULMONARY / CRITICAL CARE MEDICINE   Name: Danny Oneill MRN: [id number] DOB: Mar 17, 1932    ADMISSION DATE:  12/03/2016 CONSULTATION DATE:  12/08/2016  REFERRING MD:  OR   CHIEF COMPLAINT:  Hypotension  HISTORY OF PRESENT ILLNESS:  HPI obtained from medical chart review as patient is encephalopathic. 71 yoM with PMH of CAD, chronic combined HF (EF 2/18 at 45-50%) , HLD, PAF, CKD, asbestosis exposure, and BPH admitted on 8/30 with left hip comminuted peritrochanteric fracture s/p mechanical fall from home.  Surgery was postponed 5 days due to patient taking Brilinta and bleeding risk.  Cardiology following.  Since admission, patient developed worsening confusion, disorientation, and agitation responsive to haldol.  Patient underwent surgery on 9/4.  Left intramedullary nail intertrochanteric performed under general anesthesia with EBL of .  Pre-op Hgb 9.5.  Patient required a indwelling foley for urinary retention >821ml with return of foul turbid urine.  Post-op, patient was hypotensive unresponsive to 3L NS bolus, albumin, and required vasopressor support and reintubation for airway protection.  I-stat Hgb 6.5.  Ortho MD reassess with no emergent need to take back to surgery.  Emergency released blood started and patient to ICU.  PCCM to consult for ventilator and medical management.   PAST MEDICAL HISTORY :  He  has a past medical history of Anemia; Aortic insufficiency (03/09/2016); Arthritis; Asbestosis (HCC); Bladder outlet obstruction (03/09/2016); CAD (coronary artery disease); Cardiomyopathy, ischemic; Chronic combined systolic and diastolic CHF (congestive heart failure) (HCC); CKD (chronic kidney disease), stage III; Coronary artery disease; Dyspnea; First degree AV block; Hip fracture (HCC) (11/2016); Hyperlipidemia; Hyponatremia; Lumbar spondylosis; Myocardial infarction (HCC); Orthostatic hypotension; PAF (paroxysmal atrial fibrillation) (HCC); and Pericardial effusion.  PAST  SURGICAL HISTORY: He  has a past surgical history that includes Rotator cuff repair (2011); CATARACTS REMOVED; Total knee arthroplasty (Right, 10/03/2013); Cardiac catheterization (N/A, 02/02/2016); Cardiac catheterization (N/A, 02/02/2016); Cardiac catheterization (N/A, 02/02/2016); and Coronary stent placement (01/2016).  Allergies  Allergen Reactions  . Amiodarone Rash  . Morphine And Related Other (See Comments)    Confusion/ hallucinations     No current facility-administered medications on file prior to encounter.    Current Outpatient Prescriptions on File Prior to Encounter  Medication Sig  . aspirin EC 81 MG tablet Take 81 mg by mouth daily with breakfast.   . atorvastatin (LIPITOR) 20 MG tablet Take 1 tablet (20 mg total) by mouth daily.  . furosemide (LASIX) 20 MG tablet Take 1 tablet by mouth daily and ok to use extra tablet as needed for swelling.  Marland Kitchen lisinopril (PRINIVIL,ZESTRIL) 2.5 MG tablet Take 1 tablet (2.5 mg total) by mouth daily.  . tamsulosin (FLOMAX) 0.4 MG CAPS capsule TAKE 1 CAPSULE(0.4 MG) BY MOUTH DAILY AFTER SUPPER  . ticagrelor (BRILINTA) 90 MG TABS tablet Take 1 tablet (90 mg total) by mouth 2 (two) times daily.  . carvedilol (COREG) 6.25 MG tablet Take 1 tablet (6.25 mg total) by mouth 2 (two) times daily. (Patient not taking: Reported on 12/03/2016)    FAMILY HISTORY:  His indicated that the status of his mother is unknown.    SOCIAL HISTORY: He  reports that he has never smoked. He has never used smokeless tobacco. He reports that he does not drink alcohol or use drugs.  REVIEW OF SYSTEMS:   Unable to assess  SUBJECTIVE:  Levophed at 8 mcg/min 2nd unit of emergency released PRBC infusing  VITAL SIGNS: BP (!) 87/49   Pulse (!) 107   Temp 97.9 F (36.6 C) (Temporal)  Resp 18   Wt 193 lb 8 oz (87.8 kg)   SpO2 100%   BMI 24.84 kg/m   HEMODYNAMICS:    VENTILATOR SETTINGS: Vent Mode: PRVC FiO2 (%):  [60 %-100 %] 100 % Set Rate:  [18 bmp]  18 bmp Vt Set:  [550 mL] 550 mL PEEP:  [5 cmH20-10 cmH20] 10 cmH20 Plateau Pressure:  [15 cmH20] 15 cmH20  INTAKE / OUTPUT: I/O last 3 completed shifts: In: 2347.5 [P.O.:360; I.V.:1987.5] Out: 1276 [Urine:1175; Stool:1; Blood:100]  PHYSICAL EXAMINATION: General:  Elderly male sedate on vent  HEENT: MM pale/moist, no JVD, pupils 3/reactive Neuro: sedate, responses to painful stimuli, does not follow commands CV: s1s2 rrr, no m/r/g PULM: even/non-labored on MV, lungs bilaterally diminished  UJ:WJXB ND, bs hypoactive  Extremities: warm/dry, no edema, left hip with bloody dressing in place.  Left thigh with some swelling/taunt, + DP pulses Skin: no rashes or lesions  LABS:  BMET  Recent Labs Lab 12/04/16 0329 12/05/16 0327 12/08/16 0523  NA 139 138 136  K 4.6 4.8 4.4  CL 103 103 102  CO2 26 28 26   BUN 28* 31* 32*  CREATININE 1.16 1.14 1.02  GLUCOSE 126* 125* 121*    Electrolytes  Recent Labs Lab 12/04/16 0329 12/05/16 0327 12/08/16 0523  CALCIUM 9.0 8.9 8.5*    CBC  Recent Labs Lab 12/03/16 1410 12/04/16 0329 12/05/16 0327  WBC 4.5 6.7 7.0  HGB 10.9* 10.8* 10.7*  HCT 33.5* 34.0* 33.3*  PLT 134* 136* 143*    Coag's No results for input(s): APTT, INR in the last 168 hours.  Sepsis Markers No results for input(s): LATICACIDVEN, PROCALCITON, O2SATVEN in the last 168 hours.  ABG No results for input(s): PHART, PCO2ART, PO2ART in the last 168 hours.  Liver Enzymes No results for input(s): AST, ALT, ALKPHOS, BILITOT, ALBUMIN in the last 168 hours.  Cardiac Enzymes No results for input(s): TROPONINI, PROBNP in the last 168 hours.  Glucose No results for input(s): GLUCAP in the last 168 hours.  Imaging Dg C-arm 1-60 Min  Result Date: 12/08/2016 CLINICAL DATA:  Left hip IM nail EXAM: DG C-ARM 61-120 MIN; OPERATIVE LEFT HIP WITH PELVIS COMPARISON:  12/03/2016 FINDINGS: Four low resolution intraoperative spot views of the left hip. Total fluoroscopy  time was 55 seconds. Images demonstrate intramedullary rod and screw fixation of the proximal left femur across a trochanteric fracture. IMPRESSION: Intraoperative fluoroscopic assistance provided during surgical fixation of left femoral fracture Electronically Signed   By: Jasmine Pang M.D.   On: 12/08/2016 16:59   Dg Hip Operative Unilat W Or W/o Pelvis Left  Result Date: 12/08/2016 CLINICAL DATA:  Left hip IM nail EXAM: DG C-ARM 61-120 MIN; OPERATIVE LEFT HIP WITH PELVIS COMPARISON:  12/03/2016 FINDINGS: Four low resolution intraoperative spot views of the left hip. Total fluoroscopy time was 55 seconds. Images demonstrate intramedullary rod and screw fixation of the proximal left femur across a trochanteric fracture. IMPRESSION: Intraoperative fluoroscopic assistance provided during surgical fixation of left femoral fracture Electronically Signed   By: Jasmine Pang M.D.   On: 12/08/2016 16:59    STUDIES:  CXR 9/4 >>  CULTURES: 9/4 BC >> 9/4 UC >>  ANTIBIOTICS: 9/4 ceftriaxone >>  SIGNIFICANT EVENTS: 8/30 Admit 9/4  Surgery/ tx to ICU for hypotension  LINES/TUBES: PIV x 3 9/4 Foley >> 9/4 Left Radial Aline >> 9/4 R TL IJ CVC >>   DISCUSSION: 28 yoM with extensive cardiac PMH on brilinta with left hip fx s/p mechanical  fall on 8/30.  Developed some acute delirium.  Surgery performed on 9/4 with post-operative complications of hypotension, urinary retention, Hgb drop of 3 gm.  Intubated for airway protection.  ASSESSMENT / PLAN:  PULMONARY A: Acute respiratory insufficiency - post-operative/ airway protection P:   PRVC 8 cc/kg Stat CXR ABG in 1 hour VAP protocol Daily SBT  CARDIOVASCULAR A:  Shock- suspected hypovolemic in post op setting vs sepsis  Hx CAD s/p DES 02/2016 on brilinta and asa, chronic combined HF (EF 2/18 at 45-50%) , HLD, PAF P:  Tele monitoring Levophed for map > 65 Trend troponin x 3  Cards following Holding asa, lipitor, coreg, and brilinta for  now  RENAL A:   At risk for AKI given hypotension Urinary retention requiring foley > Hx BPH and CKD P:   LR at 10 ml/hr Stat bmet/mag now Trend BMP / urinary output/ daily weights Replace electrolytes as indicated Hold flomax   GASTROINTESTINAL A:   No acute issues P:   NPO Place OGT Consider TF if not extubated 9/5 PPI for SUP Bowel regimen  HEMATOLOGIC A:   ABLA 2/2 surgery.  Last Brilinta dose 8/30 - EBL 200 ml - istat Hgb 9.5  -> 6.5  P:  S/p 2 units emergency release PRBC Type and cross stat 1 unit of FFP and 1 pack of plts now DDAVP 20 mcg x 1 now CBC stat then trend H/H q 6 hr Check coags Monitor incision site and left thigh for s/s bleeding  INFECTIOUS A:   UTI  - urine + for leuk, numerous WBC P:   Send UC and BC Start ceftriaxone  Trend WBC and fever curve Continue foley  ENDOCRINE A:   No acute issues  P:   Monitor  NEUROLOGIC A:   Acute delirium vs encephalopathy from UTI P:   RASS goal: 0/ -1 PRN fentanyl / versed  Daily WUA if stable  FAMILY  - Updates: No family at bedside.   - Inter-disciplinary family meet or Palliative Care meeting due by:  12/15/16  CCT 70 mins  Posey Boyer, ACNP Pulmonary and Critical Care Medicine [employer] Pager: [phone]  12/08/2016, 9:35 PM

## 2016-12-08 NOTE — Consult Note (Signed)
Reason for Consult: post op hypotension Referring Physician: PACU/anesthesia  HPI: Danny Oneill is an 83 y.o. male w PMH CAD s/p stents, CHF, CKD, metabolic encephalopathy, recent admission for L hip fx, surgery delayed for medical optimization, to OR today for IMHS fixation L IT hip fx, estimated 100 cc blood loss per op report, hypotensive in PACU and consulted regarding possible post op hematoma/hemorrage into thigh.  Past Medical History:  Diagnosis Date  . Anemia   . Aortic insufficiency 03/09/2016   a. mild-mod AI by echo 03/04/16.  . Arthritis   . Asbestosis ([place])    "no breathing problems" pt states worked in [employer] for many yrs and was exposed to asbestosis  . Bladder outlet obstruction 03/09/2016  . CAD (coronary artery disease)    a. 02/2016: inf lat STEMI with occluded OM2 which was treated with DES; otherwise he had moderate 50% oRCA, 40% oLM, 70% D1, 10% mLAD, 80% lateral 2nd marg which was treated medically.  . Cardiomyopathy, ischemic   . Chronic combined systolic and diastolic CHF (congestive heart failure) (HCC)    a. EF 30-35% at time of STEMI 02/2016, improved to 40-45% by echo 11/20 (but with pericardial effusion). b. Further improved EF to 55-60% 03/04/16.  Marland Kitchen CKD (chronic kidney disease), stage III   . Coronary artery disease   . Dyspnea   . First degree AV block   . Hip fracture ([place]) 11/2016  . Hyperlipidemia   . Hyponatremia   . Lumbar spondylosis   . Myocardial infarction ([place])   . Orthostatic hypotension   . PAF (paroxysmal atrial fibrillation) ([place])    a. dx 02/2016: not anticoagulated due to pericardial effusion; amiodarone stopped due to suspected allergic drug rash.  . Pericardial effusion    a. 02/2016 - followed clinically (mod-large at first then small-mod in f/u echo).    Past Surgical History:  Procedure Laterality Date  . CARDIAC CATHETERIZATION N/A 02/02/2016   Procedure: Left Heart Cath and Coronary Angiography;  Surgeon: Jettie Booze, MD;  Location: [facility];  Service: Cardiovascular;  Laterality: N/A;  . CARDIAC CATHETERIZATION N/A 02/02/2016   Procedure: Coronary Stent Intervention;  Surgeon: Jettie Booze, MD;  Location: [facility];  Service: Cardiovascular;  Laterality: N/A;  . CARDIAC CATHETERIZATION N/A 02/02/2016   Procedure: IABP Insertion;  Surgeon: Jettie Booze, MD;  Location: [facility];  Service: Cardiovascular;  Laterality: N/A;  . CATARACTS REMOVED     BIL  . CORONARY STENT PLACEMENT  01/2016  . ROTATOR CUFF REPAIR  2011   L SHOULDER  . TOTAL KNEE ARTHROPLASTY Right 10/03/2013   Procedure: RIGHT TOTAL KNEE ARTHROPLASTY;  Surgeon: Mauri Pole, MD;  Location: [facility];  Service: Orthopedics;  Laterality: Right;    Family History  Problem Relation Age of Onset  . Cancer Mother     Social History:  reports that he has never smoked. He has never used smokeless tobacco. He reports that he does not drink alcohol or use drugs.  Allergies:  Allergies  Allergen Reactions  . Amiodarone Rash  . Morphine And Related Other (See Comments)    Confusion/ hallucinations     Medications: I have reviewed the patient's current medications.  Results for orders placed or performed during the hospital encounter of 12/03/16 (from the past 48 hour(s))  Basic metabolic panel     Status: Abnormal   Collection Time: 12/08/16  5:23 AM  Result Value Ref Range   Sodium  136 135 - 145 mmol/L   Potassium 4.4 3.5 - 5.1 mmol/L   Chloride 102 101 - 111 mmol/L   CO2 26 22 - 32 mmol/L   Glucose, Bld 121 (H) 65 - 99 mg/dL   BUN 32 (H) 6 - 20 mg/dL   Creatinine, Ser 1.02 0.61 - 1.24 mg/dL   Calcium 8.5 (L) 8.9 - 10.3 mg/dL   GFR calc non Af Amer >60 >60 mL/min   GFR calc Af Amer >60 >60 mL/min    Comment: (NOTE) The eGFR has been calculated using the CKD EPI equation. This calculation has not been validated in all clinical situations. eGFR's persistently <60 mL/min signify  possible Chronic Kidney Disease.    Anion gap 8 5 - 15  Urinalysis, Complete w Microscopic     Status: Abnormal   Collection Time: 12/08/16  8:15 PM  Result Value Ref Range   Color, Urine YELLOW YELLOW   APPearance TURBID (A) CLEAR   Specific Gravity, Urine 1.012 1.005 - 1.030   pH 5.0 5.0 - 8.0   Glucose, UA NEGATIVE NEGATIVE mg/dL   Hgb urine dipstick LARGE (A) NEGATIVE   Bilirubin Urine NEGATIVE NEGATIVE   Ketones, ur NEGATIVE NEGATIVE mg/dL   Protein, ur 100 (A) NEGATIVE mg/dL   Nitrite NEGATIVE NEGATIVE   Leukocytes, UA MODERATE (A) NEGATIVE   RBC / HPF TOO NUMEROUS TO COUNT 0 - 5 RBC/hpf   WBC, UA TOO NUMEROUS TO COUNT 0 - 5 WBC/hpf   Bacteria, UA MANY (A) NONE SEEN   Squamous Epithelial / LPF NONE SEEN NONE SEEN   WBC Clumps PRESENT    Mucus PRESENT    Non Squamous Epithelial 0-5 (A) NONE SEEN  Prepare fresh frozen plasma     Status: None (Preliminary result)   Collection Time: 12/08/16  8:43 PM  Result Value Ref Range   Unit Number [id number]    Blood Component Type LIQ PLASMA    Unit division 00    Status of Unit REL FROM [facility]    Unit tag comment VERBAL ORDERS PER DR GERMEROTH    Transfusion Status OK TO TRANSFUSE    Unit Number [id number]    Blood Component Type LIQ PLASMA    Unit division 00    Status of Unit ISSUED    Unit tag comment VERBAL ORDERS PER DR GERMEROTH    Transfusion Status OK TO TRANSFUSE   Type and screen     Status: None (Preliminary result)   Collection Time: 12/08/16  8:45 PM  Result Value Ref Range   ABO/RH(D) O NEG    Antibody Screen NEG    Sample Expiration 12/11/2016    Unit Number [id number]    Blood Component Type RED CELLS,LR    Unit division 00    Status of Unit ISSUED    Unit tag comment VERBAL ORDERS PER DR GERMEROTH    Transfusion Status OK TO TRANSFUSE    Crossmatch Result PENDING    Unit Number [id number]    Blood Component Type RED CELLS,LR    Unit division 00    Status of Unit ISSUED    Unit tag comment  VERBAL ORDERS PER DR GERMEROTH    Transfusion Status OK TO TRANSFUSE    Crossmatch Result PENDING   Prepare Pheresed Platelets     Status: None (Preliminary result)   Collection Time: 12/08/16  9:07 PM  Result Value Ref Range   Unit Number [id number]    Blood Component Type PLTP LR3 PAS  Unit division 00    Status of Unit ISSUED    Transfusion Status OK TO TRANSFUSE   CBC with Differential/Platelet     Status: Abnormal (Preliminary result)   Collection Time: 12/08/16  9:22 PM  Result Value Ref Range   WBC 1.3 (LL) 4.0 - 10.5 K/uL    Comment: REPEATED TO VERIFY CRITICAL RESULT CALLED TO, READ BACK BY AND VERIFIED WITH: Roque Lias RN AT 2144 ON [id number] BY D. Robinette Haines.    RBC 2.29 (L) 4.22 - 5.81 MIL/uL   Hemoglobin 7.1 (L) 13.0 - 17.0 g/dL   HCT 21.5 (L) 39.0 - 52.0 %   MCV 93.9 78.0 - 100.0 fL   MCH 31.0 26.0 - 34.0 pg   MCHC 33.0 30.0 - 36.0 g/dL   RDW 15.7 (H) 11.5 - 15.5 %   Platelets PENDING 150 - 400 K/uL   Neutrophils Relative % 77 %   Neutro Abs 1.0 (L) 1.7 - 7.7 K/uL   Lymphocytes Relative 20 %   Lymphs Abs 0.3 (L) 0.7 - 4.0 K/uL   Monocytes Relative 1 %   Monocytes Absolute 0.0 (L) 0.1 - 1.0 K/uL   Eosinophils Relative 2 %   Eosinophils Absolute 0.0 0.0 - 0.7 K/uL   Basophils Relative 1 %   Basophils Absolute 0.0 0.0 - 0.1 K/uL    Portable Chest Xray  Result Date: 12/08/2016 CLINICAL DATA:  Hypoxia EXAM: PORTABLE CHEST 1 VIEW COMPARISON:  December 03, 2016 FINDINGS: Endotracheal tube tip is 5.1 cm above the carina. Orogastric tube tip is at the gastroesophageal junction with the side port above the gastroesophageal junction in the distal esophagus. Central catheter tip is in superior vena cava. No pneumothorax. There are multiple foci of pleural calcification as well as calcification along each hemidiaphragm. There is mild scarring in each mid lung. There is no edema or consolidation. Heart is upper normal in size with pulmonary vascularity within normal limits. There  is aortic atherosclerosis. No adenopathy. No focal bone lesions. IMPRESSION: Tube and catheter positions as described without pneumothorax. Note that the orogastric tube side port is above the gastroesophageal junction. Advise advancing orogastric tube approximately 10 cm to insure that tube tip and side port are within the stomach. Areas of previous asbestos exposure. Areas of mild scarring. No edema or consolidation. Heart upper normal in size.  Areas of aortic atherosclerosis. Aortic Atherosclerosis (ICD10-I70.0). Electronically Signed   By: Lowella Grip III M.D.   On: 12/08/2016 21:35   Dg C-arm 1-60 Min  Result Date: 12/08/2016 CLINICAL DATA:  Left hip IM nail EXAM: DG C-ARM 61-120 MIN; OPERATIVE LEFT HIP WITH PELVIS COMPARISON:  12/03/2016 FINDINGS: Four low resolution intraoperative spot views of the left hip. Total fluoroscopy time was 55 seconds. Images demonstrate intramedullary rod and screw fixation of the proximal left femur across a trochanteric fracture. IMPRESSION: Intraoperative fluoroscopic assistance provided during surgical fixation of left femoral fracture Electronically Signed   By: Donavan Foil M.D.   On: 12/08/2016 16:59   Dg Hip Operative Unilat W Or W/o Pelvis Left  Result Date: 12/08/2016 CLINICAL DATA:  Left hip IM nail EXAM: DG C-ARM 61-120 MIN; OPERATIVE LEFT HIP WITH PELVIS COMPARISON:  12/03/2016 FINDINGS: Four low resolution intraoperative spot views of the left hip. Total fluoroscopy time was 55 seconds. Images demonstrate intramedullary rod and screw fixation of the proximal left femur across a trochanteric fracture. IMPRESSION: Intraoperative fluoroscopic assistance provided during surgical fixation of left femoral fracture Electronically Signed   By: Maudie Mercury  Francoise Ceo M.D.   On: 12/08/2016 16:59     Vitals Temp:  [97.7 F (36.5 C)-98.6 F (37 C)] 97.9 F (36.6 C) (09/04 2100) Pulse Rate:  [71-111] 107 (09/04 2111) Resp:  [17-27] 18 (09/04 2111) BP:  (51-147)/(37-110) 87/49 (09/04 2111) SpO2:  [87 %-100 %] 100 % (09/04 2111) Arterial Line BP: (42-57)/(22-33) 57/33 (09/04 2100) FiO2 (%):  [60 %-100 %] 100 % (09/04 2132) Body mass index is 24.84 kg/m.  Physical Exam: elderly WM, intubated, pressures stabilizing with 2U PRBCs and fluids. L thigh with mild diffuse swelling, gauze over incisions with bloody drainage but no active bleeding from incisions and no blood emitted with compression of thigh. Clinically exam does not suggest a compartment syndrome of thigh.     Assessment/Plan: Impression: hypotension secondary to volume depletion, hip fracture, anticipated surgical blood loss, and likely anemia of chronic disease. Starting HGB 10, a drop to 6 not uncommon. Treatment: interventions per anesthesia and intensivist.  Wenona [place] M Beckham Buxbaum 12/08/2016, 9:48 PM  Contact # [phone]

## 2016-12-08 NOTE — Transfer of Care (Signed)
Immediate Anesthesia Transfer of Care Note  Patient: Domnic Braswell  Procedure(s) Performed: Procedure(s): INTRAMEDULLARY (IM) NAIL INTERTROCHANTRIC (Left)  Patient Location: PACU  Anesthesia Type:General  Level of Consciousness: drowsy and pateint uncooperative  Airway & Oxygen Therapy: Patient Spontanous Breathing and Patient connected to nasal cannula oxygen  Post-op Assessment: Report given to RN and Post -op Vital signs reviewed and stable  Post vital signs: Reviewed and stable  Last Vitals:  Vitals:   12/08/16 0800 12/08/16 1300  BP:  (!) 107/50  Pulse:  71  Resp:  18  Temp:  37 C  SpO2: 94% 96%    Last Pain:  Vitals:   12/08/16 1300  TempSrc: Oral  PainSc:       Patients Stated Pain Goal: 1 (12/07/16 2202)  Complications: No apparent anesthesia complications

## 2016-12-08 NOTE — Progress Notes (Signed)
eLink Physician-Brief Progress Note Patient Name: Willow Dahle DOB: [birth year] MRN: [id number]   Date of Service  12/08/2016  HPI/Events of Note  Slight oozing from line  eICU Interventions  Send repeat cbc for plat Await cpags that were sent Add fibrinogen Had been on bril on 8/30     Intervention Category Intermediate Interventions: Bleeding - evaluation and treatment with blood products  Drianna Chandran J. 12/08/2016, 11:15 PM

## 2016-12-08 NOTE — Progress Notes (Signed)
PROGRESS NOTE    Danny Oneill  HTD:[id number] DOB: 1932/02/15 DOA: 12/03/2016 PCP: Geoffry Paradise, MD    Brief Narrative:  83 yo male who presented after suffering a mechanical fall. Patient is known to have bladder outlet obstruction, coronary disease, and chronic kidney disease. Patient fell while on a seated position, patient developed significant hip pain after the fall, unable to stand back up on his feet, patient was brought to the hospital for further evaluation. On the physical examination his blood pressure was 126/79, heart rate 62, respiratory rate 16, oxygen saturation 96%. Moist mucous membranes, lungs clear to auscultation bilaterally, heart S1-S2 present and rhythmic, abdomen soft nontender, no lower extremity edema. Sodium 140, potassium 4.7, chloride 105, bicarbonate 26, glucose 104, BUN 28, creatinine 1.29, white count of 4.5, Hb 10.9, hematocrit 33.5, platelets 134. hest x-ray with signs of hyperinflation, bilateral pleural plaque calcifications. EKG with low voltage, positive PACs and PVCs. Left hip film with comminuted subtrochanteric hip fracture with angulation and displacement of multiple fracture fragments.  Patient admitted to the hospital with working diagnosis of acute left hip fracture. Surgery was delayed due to high risk of bleeding due to ticagrelor use.   Patient has been developing worsening confusion, disorientation and agitation. Required antipsychotic agents. Likely developed encephalopathy with delirium.    Assessment & Plan:   Principal Problem:   Closed left hip fracture (HCC) Active Problems:   Hyperlipidemia   CKD (chronic kidney disease), stage III   Hip fracture (HCC)   1. Acute left subtrochanteric fracture.  Pain control, anddvt prophylaxis. Patient will be surgically intervened today. Noted deconditioning, will need physical therapy evaluation, anticipate need of SNF at discharge.    2. NEW worsening metabolic encephalopathy with delirum.  Patient this am confused, agitates and combative, responded well to haldol, that will continue as needed. Patient has history of hospital related delirium on prior hospitalization. Continue supportive medical care with IV fluids, dextrose, pain control, and nutrition, patient's family at the bedside. Add neuro checks, q 4 hours.   3. Chronic kidney disease stage III. Continue hydration with balance electrolyte solutions, renal function with serum cr at 1,0 with k at 4,4, Na 136 and serum bicarbonate at 26. Decreased po intake due to encephalopathy.   4. Chronic congestive diastolicheart failure with ejection fraction with EF 35 to 40%.Chronic and stable, no signs of exacerbation, clinically hypovolemic, will continue hydration with IV fluids, systolic blood pressure 100 to 117.    5. Coronary artery disease. DES 01/2016, at the circumflex. No chest pain or angina. Holding ticagrelor, continue aspirin.   6, BPH. Continue with flomax.   DVT prophylaxis:scd Code Status:full Family Communication:I spoke with patient's son at the bedside Disposition Plan:Home or snf   Consultants:  Orthopedics  Cardiology   Procedures:    Antimicrobials:      Subjective: Patient has been progressively getting more confused and agitates, not following commands, all information from nursing and family at the bedside, patient not able to give any history.   Objective: Vitals:   12/07/16 1423 12/07/16 2129 12/08/16 0413 12/08/16 0800  BP: 130/64 110/60 114/69   Pulse: 81 70 79   Resp: 16 17 18    Temp: 97.7 F (36.5 C) 98.5 F (36.9 C) 97.7 F (36.5 C)   TempSrc: Oral Oral Oral   SpO2: 98% 99% 98% 94%  Weight:        Intake/Output Summary (Last 24 hours) at 12/08/16 1052 Last data filed at 12/08/16 0900  Gross per  24 hour  Intake           1327.5 ml  Output              451 ml  Net            876.5 ml   Filed Weights   12/06/16 2026 12/07/16 0449  Weight: 84.9 kg  (187 lb 3.2 oz) 87.8 kg (193 lb 8 oz)    Examination:  General: deconditioned and ill looking appearing Neurology: Awake, confused and altered. Disorientated, difficult to follow on commands.  E ENT: positive pallor, no icterus, oral mucosa dry Cardiovascular: S1-S2 present, rhythmic, no gallops, rubs, or murmurs. No jugular venous distention, no lower extremity edema. Pulmonary: decreased breath sounds bilaterally at bases, adequate air movement, no wheezing, rhonchi or rales. Gastrointestinal. Abdomen flat, no organomegaly, non tender, no rebound or guarding Skin. No rashes Musculoskeletal: no joint deformities     Data Reviewed: I have personally reviewed following labs and imaging studies  CBC:  Recent Labs Lab 12/03/16 1410 12/04/16 0329 12/05/16 0327  WBC 4.5 6.7 7.0  NEUTROABS  --   --  4.4  HGB 10.9* 10.8* 10.7*  HCT 33.5* 34.0* 33.3*  MCV 94.4 95.0 93.5  PLT 134* 136* 143*   Basic Metabolic Panel:  Recent Labs Lab 12/03/16 1410 12/04/16 0329 12/05/16 0327 12/08/16 0523  NA 140 139 138 136  K 4.7 4.6 4.8 4.4  CL 105 103 103 102  CO2 GLUCOSE 104* 126* 125* 121*  BUN 28* 28* 31* 32*  CREATININE 1.29* 1.16 1.14 1.02  CALCIUM 9.0 9.0 8.9 8.5*   GFR: Estimated Creatinine Clearance: 63.8 mL/min (by C-G formula based on SCr of 1.02 mg/dL). Liver Function Tests: No results for input(s): AST, ALT, ALKPHOS, BILITOT, PROT, ALBUMIN in the last 168 hours. No results for input(s): LIPASE, AMYLASE in the last 168 hours. No results for input(s): AMMONIA in the last 168 hours. Coagulation Profile: No results for input(s): INR, PROTIME in the last 168 hours. Cardiac Enzymes: No results for input(s): CKTOTAL, CKMB, CKMBINDEX, TROPONINI in the last 168 hours. BNP (last 3 results) No results for input(s): PROBNP in the last 8760 hours. HbA1C: No results for input(s): HGBA1C in the last 72 hours. CBG: No results for input(s): GLUCAP in the last 168  hours. Lipid Profile: No results for input(s): CHOL, HDL, LDLCALC, TRIG, CHOLHDL, LDLDIRECT in the last 72 hours. Thyroid Function Tests: No results for input(s): TSH, T4TOTAL, FREET4, T3FREE, THYROIDAB in the last 72 hours. Anemia Panel: No results for input(s): VITAMINB12, FOLATE, FERRITIN, TIBC, IRON, RETICCTPCT in the last 72 hours.    Radiology Studies: I have reviewed all of the imaging during this hospital visit personally     Scheduled Meds: . aspirin EC  81 mg Oral Daily  . atorvastatin  20 mg Oral q1800  . bacitracin  1 application Topical BID  . carvedilol  3.125 mg Oral BID WC  . docusate sodium  100 mg Oral BID  . haloperidol  1 mg Oral Q4H   Or  . haloperidol lactate  1 mg Intramuscular Q4H  . Melatonin  3 mg Oral QHS  . sodium chloride flush  3 mL Intravenous Q12H  . tamsulosin  0.4 mg Oral Daily   Continuous Infusions: . dextrose 5% lactated ringers 75 mL/hr at 12/08/16 0847  . lactated ringers       LOS: 5 days        Danny Oneill  Danny Coldwell, MD [facility] Pager [phone]

## 2016-12-08 NOTE — Brief Op Note (Signed)
12/03/2016 - 12/08/2016  5:02 PM  PATIENT:  Danny Oneill  83 y.o. male  PRE-OPERATIVE DIAGNOSIS:  Left hip fracture  POST-OPERATIVE DIAGNOSIS:  Left hip fracture  PROCEDURE:  Procedure(s): INTRAMEDULLARY (IM) NAIL INTERTROCHANTRIC (Left)  SURGEON:  Surgeon(s) and Role:    * Yolonda Kida, MD - Primary  PHYSICIAN ASSISTANT:   ASSISTANTS: none   ANESTHESIA:   general  EBL:  Total I/O In: 0  Out: 600 [Urine:500; Blood:100]  BLOOD ADMINISTERED:none  DRAINS: none   LOCAL MEDICATIONS USED:  NONE  SPECIMEN:  No Specimen  DISPOSITION OF SPECIMEN:  N/A  COUNTS:  YES  TOURNIQUET:  * No tourniquets in log *  DICTATION: .Note written in EPIC  PLAN OF CARE: Admit to inpatient   PATIENT DISPOSITION:  PACU - hemodynamically stable.   Delay start of Pharmacological VTE agent (>24hrs) due to surgical blood loss or risk of bleeding: not applicable

## 2016-12-08 NOTE — Progress Notes (Signed)
Pt intubated

## 2016-12-08 NOTE — Clinical Social Work Note (Addendum)
Clinical Social Work Assessment  Patient Details  Name: Danny Oneill MRN: [id number] Date of Birth: Dec 05, 1931  Date of referral:  12/08/16               Reason for consult:  Facility Placement                Permission sought to share information with:  Facility Art therapist granted to share information::  Yes, Verbal Permission Granted  Name::     son at bedside  Agency::  SNF  Relationship::     Contact Information:     Housing/Transportation Living arrangements for the past 2 months:  Single Family Home Source of Information:  Adult Children Patient Interpreter Needed:  None Criminal Activity/Legal Involvement Pertinent to Current Situation/Hospitalization:  No - Comment as needed Significant Relationships:  Adult Children, Other Family Members Lives with:  Adult Children Do you feel safe going back to the place where you live?  No Need for family participation in patient care:  Yes (Comment)  Care giving concerns:  CSW met with patient and son at bedside. Pt was from home and was independent prior to hospital placement. Pt has experienced hospital delirium and altered mental state and has family with him at bedside to provide support. No hospital sitter.  Social Worker assessment / plan:  CSW discussed role and SNF options and placement. CSW explained process for transition to SNF. Family in agreement with SNF placement due to current impairment.  Pt scheduled for surgery today. Son indicated that they were interested in [facility] as well. CSW will send out once surgery completed and PT/OT evaluations.  FL2 pending after SX and OT/PT evaluations. Passr confirmed.   Employment status:  Retired Nurse, adult PT Recommendations:  [place] / Referral to community resources:  [place]  Patient/Family's Response to care:  Family appreciative of [place] meeting with them and discussing SNF  options.  Patient/Family's Understanding of and Emotional Response to Diagnosis, Current Treatment, and Prognosis:  Family has good understanding of diagnosis, current treatment and prognosis. Family hopeful he will return to baseline after surgery and short term rehab.  No issues identified at this time.  Emotional Assessment Appearance:  Appears stated age Attitude/Demeanor/Rapport:  Lethargic Affect (typically observed):  Unable to Assess Orientation:  Oriented to Self Alcohol / Substance use:  Not Applicable Psych involvement (Current and /or in the community):  No (Comment)  Discharge Needs  Concerns to be addressed:  Care Coordination Readmission within the last 30 days:  No Current discharge risk:  Physical Impairment, Dependent with Mobility Barriers to Discharge:  No Barriers Identified   Normajean Baxter, LCSW 12/08/2016, 10:44 AM

## 2016-12-08 NOTE — Progress Notes (Signed)
RT NOTE:  Pt placed on VENT: PRVC: VT: 550/ RR: 18/ FiO2: 100%/ Peep: 10 7.5 ETT secure with holder @23  lip PACU RN will call Alecia Lemming, RRT when patient is ready to transport.

## 2016-12-08 NOTE — Op Note (Signed)
Date of Surgery: 12/08/2016  INDICATIONS: Danny Oneill is a 83 y.o.-year-old male who sustained a left hip fracture. He had a ground-level fall on August 30 and was evaluated by my partner at that time.  He was indicated for operative fixation of the left hip and I was asked to assume care on August 31.  Unfortunately he has a very recent cardiac history and was on antiplatelet therapy.  The Brilinta is one drug that was recommended for a minimum of a 5 day washout to prevent excessive intraoperative bleeding and also there is no anecdote available for that.  After discussion with the cardiologist we elected to delay surgical fixation for 5 days to allow for appropriate washout.  He was admitted to the hospitalist service for medical management.  I did have a chance to speak at length with the patient and his family about the risk benefits and indications of the procedure.  These include but are not limited to bleeding, infection, damage to surrounding neurovascular structures, malunion, nonunion, hardware failure, hardware pain, need for future surgery, developed blood clots, and the risk of anesthesia.The risks and benefits of the procedure discussed with the patient and his family prior to the procedure and all questions were answered; consent was obtained.  Of note during the waiting period for surgery the patient did develop some hospital delirium.  This precipitated him that needing some Haldol when necessary and also some wrist restraints on the floor.  Immediately before proceeding to the operative theater reiterated with the family due to his age and the delay to being able to perform fixation he has certainly increased his risk of perioperative morbidity and mortality.  PREOPERATIVE DIAGNOSIS: left hip fracture   POSTOPERATIVE DIAGNOSIS: Same   PROCEDURE: Treatment of intertrochanteric, pertrochanteric, subtrochanteric fracture with intramedullary implant. CPT [phone]   SURGEON: Danny Oneill, M.D.   ANESTHESIA: general   IV FLUIDS AND URINE: See anesthesia record   ESTIMATED BLOOD LOSS: 200 mL  IMPLANTS:  Synthes TFN A 10 mm x 235 mm 105 mm proximal compression screw 36 mm distal interlocking screw  DRAINS: None.   COMPLICATIONS: None.   DESCRIPTION OF PROCEDURE: The patient was brought to the operating room and placed supine on the operating table. The patient's leg had been signed prior to the procedure. The patient had the anesthesia placed by the anesthesiologist. The prep verification and incision time-outs were performed to confirm that this was the correct patient, site, side and location. The patient had an SCD on the opposite lower extremity. The patient did receive antibiotics prior to the incision and was re-dosed during the procedure as needed at indicated intervals. The patient was positioned on the fracture table with the table in traction and internal rotation to reduce the hip. The well leg was placed in a scissor position and all bony prominences were well-padded. The patient had the lower extremity prepped and draped in the standard surgical fashion. The incision was made 4 finger breadths superior to the greater trochanter. A guide pin was inserted into the tip of the greater trochanter under fluoroscopic guidance. An opening reamer was used to gain access to the femoral canal. The nail length was measured and inserted down the femoral canal to its proper depth. The appropriate version of insertion for the lag screw was found under fluoroscopy. A pin was inserted up the femoral neck through the jig. The length of the lag screw was then measured. The lag screw was inserted as  near to center-center in the head as possible.  This screw had good purchase and position in the head.  Next we turned our attention to the distal interlocking screw.  This was placed to the appropriate triple drilled trocar in the slot in the jig.  Skin was cut with a knife at the level  of the screw dissection was carried down through subcu tissue to the IT band and to the level of the bone.  The tonsil was spread.  Next the drill was drilled bicortically through the nail.  This was measured off the drill bit to the appropriate length.  36 mm interlocking screw was placed under hand power.   The wound was copiously irrigated with saline and the subcutaneous layer closed with 2.0 Monocryl and the skin was reapproximated with staples. The wounds were cleaned and dried a final time and a sterile dressing was placed. The hip was taken through a range of motion at the end of the case under fluoroscopic imaging to visualize the approach-withdraw phenomenon and confirm implant length in the head. The patient was then awakened from anesthesia and taken to the recovery room in stable condition. All counts were correct at the end of the case.   POSTOPERATIVE PLAN: The patient will be 50% weight bearing as tolerated and will return in 2 weeks for staple removal and the patient will receive DVT prophylaxis based on other medications, activity level, and risk ratio of bleeding to thrombosis.  He may restart his preoperative Brilinta as soon as necessary.   Maryan Rued, MD [facility] [phone] 9:40 PM

## 2016-12-08 NOTE — Progress Notes (Signed)
MD still present at bedside.  Pt has adult small cuff on RUA.  Current BP 91/64.  Order placed for Haldol of 10MG  so MD can place A-Line.  Pharmacy called and stat request sent to notify.

## 2016-12-08 NOTE — Anesthesia Postprocedure Evaluation (Addendum)
Anesthesia Post Note  Patient: Danny Oneill  Procedure(s) Performed: Procedure(s) (LRB): INTRAMEDULLARY (IM) NAIL INTERTROCHANTRIC (Left)     Patient location during evaluation: PACU Anesthesia Type: General Level of consciousness: obtunded/minimal responses Pain management: pain level controlled Vital Signs Assessment: vitals unstable Respiratory status: spontaneous breathing Cardiovascular status: unstable Anesthetic complications: no Comments: Called to PACU for low BP. Pt agitated and moving, making NIBP difficult to obtain. I asked for bladder scan given he had a condom catheter. Foley placed with >800 ml of urine. Urine was very turbid and odorous. BP was marginal, but seemed to be improved. Called back for low BP. Called Primary service at ~720. Eventually discussed with them pt situation. CCM consulted for hypotension. Patient was quite strong and resisted attempt to place a-line. Haldol given. Phenylephrine gtt started. After haldol, aline placed. Norepi ordered when a line revealed SBP in 40s. Phenylephrine bolus given. And infusion increased. iStat revealed hgb of ~6. At this point it was discovered the patient had no active type and screen. Albumin given. Emergency release PRBCs ordered and 2 u given. Decision to place CVL and reintubate to facilitate with hemodynamic management was made. Also had Dr. Rennis Chris evaluate the leg to make sure he did not feel there was expanding hematoma or arterial bleed.     Last Vitals:  Vitals:   12/08/16 2100 12/08/16 2111  BP: (!) 59/40 (!) 87/49  Pulse: (!) 111 (!) 107  Resp: (!) 23 18  Temp: 36.6 C   SpO2: 96% 100%    Last Pain:  Vitals:   12/08/16 2100  TempSrc: Temporal  PainSc:                  [name]

## 2016-12-08 NOTE — Progress Notes (Signed)
Urine culture sent.

## 2016-12-08 NOTE — Anesthesia Procedure Notes (Signed)
Central Venous Catheter Insertion Performed by: Arvie Bartholomew, anesthesiologist Patient location: Pre-op. Preanesthetic checklist: patient identified, IV checked, site marked, risks and benefits discussed, surgical consent, monitors and equipment checked, pre-op evaluation, timeout performed and anesthesia consent Position: Trendelenburg Lidocaine 1% used for infiltration and patient sedated Hand hygiene performed , maximum sterile barriers used  and Seldinger technique used Catheter size: 8 Fr Total catheter length 16. Central line was placed.Double lumen Procedure performed using ultrasound guided technique. Ultrasound Notes:anatomy identified, needle tip was noted to be adjacent to the nerve/plexus identified, no ultrasound evidence of intravascular and/or intraneural injection and image(s) printed for medical record Attempts: 1 Following insertion, dressing applied, line sutured and Biopatch. Post procedure assessment: blood return through all ports, free fluid flow and no air  Patient tolerated the procedure well with no immediate complications.          

## 2016-12-09 ENCOUNTER — Encounter (HOSPITAL_COMMUNITY): Payer: Self-pay | Admitting: Orthopedic Surgery

## 2016-12-09 DIAGNOSIS — J988 Other specified respiratory disorders: Secondary | ICD-10-CM

## 2016-12-09 LAB — BPAM FFP
BLOOD PRODUCT EXPIRATION DATE: 201809192359
Blood Product Expiration Date: 201809192359
ISSUE DATE / TIME: 201809042047
ISSUE DATE / TIME: 201809042047
UNIT TYPE AND RH: 6200
Unit Type and Rh: 6200

## 2016-12-09 LAB — OSMOLALITY, URINE: Osmolality, Ur: 312 mOsm/kg (ref 300–900)

## 2016-12-09 LAB — HEMOGLOBIN AND HEMATOCRIT, BLOOD
HEMATOCRIT: 27.3 % — AB (ref 39.0–52.0)
Hemoglobin: 8.9 g/dL — ABNORMAL LOW (ref 13.0–17.0)

## 2016-12-09 LAB — SODIUM, URINE, RANDOM: SODIUM UR: 45 mmol/L

## 2016-12-09 LAB — PREPARE FRESH FROZEN PLASMA
Unit division: 0
Unit division: 0

## 2016-12-09 LAB — BPAM PLATELET PHERESIS
Blood Product Expiration Date: 201809062359
ISSUE DATE / TIME: 201809042110
Unit Type and Rh: 5100

## 2016-12-09 LAB — PREPARE PLATELET PHERESIS: UNIT DIVISION: 0

## 2016-12-09 LAB — POCT I-STAT 7, (LYTES, BLD GAS, ICA,H+H)
ACID-BASE DEFICIT: 8 mmol/L — AB (ref 0.0–2.0)
Bicarbonate: 17.3 mmol/L — ABNORMAL LOW (ref 20.0–28.0)
CALCIUM ION: 1.1 mmol/L — AB (ref 1.15–1.40)
HCT: 19 % — ABNORMAL LOW (ref 39.0–52.0)
Hemoglobin: 6.5 g/dL — CL (ref 13.0–17.0)
O2 SAT: 95 %
PH ART: 7.369 (ref 7.350–7.450)
PO2 ART: 71 mmHg — AB (ref 83.0–108.0)
Potassium: 3.9 mmol/L (ref 3.5–5.1)
Sodium: 142 mmol/L (ref 135–145)
TCO2: 18 mmol/L — ABNORMAL LOW (ref 22–32)
pCO2 arterial: 29.7 mmHg — ABNORMAL LOW (ref 32.0–48.0)

## 2016-12-09 LAB — BPAM RBC
BLOOD PRODUCT EXPIRATION DATE: 201809202359
Blood Product Expiration Date: 201809202359
ISSUE DATE / TIME: 201809042046
ISSUE DATE / TIME: 201809042046
UNIT TYPE AND RH: 9500
Unit Type and Rh: 9500

## 2016-12-09 LAB — BASIC METABOLIC PANEL
ANION GAP: 15 (ref 5–15)
BUN: 47 mg/dL — ABNORMAL HIGH (ref 6–20)
CO2: 18 mmol/L — AB (ref 22–32)
Calcium: 7.6 mg/dL — ABNORMAL LOW (ref 8.9–10.3)
Chloride: 104 mmol/L (ref 101–111)
Creatinine, Ser: 2.19 mg/dL — ABNORMAL HIGH (ref 0.61–1.24)
GFR calc Af Amer: 30 mL/min — ABNORMAL LOW (ref >60)
GFR calc non Af Amer: 26 mL/min — ABNORMAL LOW (ref >60)
GLUCOSE: 108 mg/dL — AB (ref 65–99)
POTASSIUM: 3.7 mmol/L (ref 3.5–5.1)
Sodium: 137 mmol/L (ref 135–145)

## 2016-12-09 LAB — GLUCOSE, CAPILLARY
GLUCOSE-CAPILLARY: 106 mg/dL — AB (ref 65–99)
GLUCOSE-CAPILLARY: 101 mg/dL — AB (ref 65–99)
Glucose-Capillary: 96 mg/dL (ref 65–99)
Glucose-Capillary: 114 mg/dL — ABNORMAL HIGH (ref 65–99)
Glucose-Capillary: 128 mg/dL — ABNORMAL HIGH (ref 65–99)
Glucose-Capillary: 150 mg/dL — ABNORMAL HIGH (ref 65–99)

## 2016-12-09 LAB — CBC WITH DIFFERENTIAL/PLATELET
BASOS PCT: 0 %
Basophils Absolute: 0 10*3/uL (ref 0.0–0.1)
EOS PCT: 0 %
Eosinophils Absolute: 0 10*3/uL (ref 0.0–0.7)
HEMATOCRIT: 25.7 % — AB (ref 39.0–52.0)
Hemoglobin: 8.7 g/dL — ABNORMAL LOW (ref 13.0–17.0)
LYMPHS PCT: 4 %
Lymphs Abs: 0.4 10*3/uL — ABNORMAL LOW (ref 0.7–4.0)
MCH: 30.3 pg (ref 26.0–34.0)
MCHC: 33.9 g/dL (ref 30.0–36.0)
MCV: 89.5 fL (ref 78.0–100.0)
MONO ABS: 0.4 10*3/uL (ref 0.1–1.0)
Monocytes Relative: 4 %
NEUTROS PCT: 92 %
Neutro Abs: 8.7 10*3/uL — ABNORMAL HIGH (ref 1.7–7.7)
Platelets: 104 10*3/uL — ABNORMAL LOW (ref 150–400)
RBC: 2.87 MIL/uL — ABNORMAL LOW (ref 4.22–5.81)
RDW: 17.5 % — ABNORMAL HIGH (ref 11.5–15.5)
WBC MORPHOLOGY: INCREASED
WBC: 9.5 10*3/uL (ref 4.0–10.5)

## 2016-12-09 LAB — BLOOD PRODUCT ORDER (VERBAL) VERIFICATION

## 2016-12-09 LAB — TYPE AND SCREEN
ABO/RH(D): O NEG
Antibody Screen: NEGATIVE
UNIT DIVISION: 0
Unit division: 0

## 2016-12-09 LAB — TROPONIN I
TROPONIN I: 0.07 ng/mL — AB (ref <0.03)
Troponin I: 0.06 ng/mL (ref <0.03)

## 2016-12-09 LAB — LACTIC ACID, PLASMA: LACTIC ACID, VENOUS: 6.5 mmol/L — AB (ref 0.5–1.9)

## 2016-12-09 LAB — POCT I-STAT 3, ART BLOOD GAS (G3+)
ACID-BASE DEFICIT: 6 mmol/L — AB (ref 0.0–2.0)
Bicarbonate: 18.8 mmol/L — ABNORMAL LOW (ref 20.0–28.0)
O2 Saturation: 98 %
PCO2 ART: 33.5 mmHg (ref 32.0–48.0)
PH ART: 7.357 (ref 7.350–7.450)
PO2 ART: 116 mmHg — AB (ref 83.0–108.0)
Patient temperature: 98.4
TCO2: 20 mmol/L — ABNORMAL LOW (ref 22–32)

## 2016-12-09 LAB — MAGNESIUM
MAGNESIUM: 2.6 mg/dL — AB (ref 1.7–2.4)
Magnesium: 2.7 mg/dL — ABNORMAL HIGH (ref 1.7–2.4)

## 2016-12-09 LAB — FIBRINOGEN
FIBRINOGEN: 346 mg/dL (ref 210–475)
Fibrinogen: 341 mg/dL (ref 210–475)

## 2016-12-09 LAB — PHOSPHORUS
PHOSPHORUS: 7 mg/dL — AB (ref 2.5–4.6)
PHOSPHORUS: 6.6 mg/dL — AB (ref 2.5–4.6)

## 2016-12-09 LAB — PATHOLOGIST SMEAR REVIEW

## 2016-12-09 MED ORDER — NOREPINEPHRINE 16 MG/250ML-% IV SOLN
0.0000 ug/min | INTRAVENOUS | Status: DC
  Administered 2016-12-09 (×2): 40 ug/min via INTRAVENOUS
  Administered 2016-12-10: 30 ug/min via INTRAVENOUS
  Administered 2016-12-10: 35 ug/min via INTRAVENOUS
  Administered 2016-12-10: 30 ug/min via INTRAVENOUS
  Administered 2016-12-11: 20 ug/min via INTRAVENOUS
  Administered 2016-12-12: 6 ug/min via INTRAVENOUS
  Administered 2016-12-14: 5 ug/min via INTRAVENOUS
  Administered 2016-12-17: 2 ug/min via INTRAVENOUS
  Administered 2016-12-21: 8 ug/min via INTRAVENOUS
  Filled 2016-12-09 (×10): qty 250

## 2016-12-09 MED ORDER — ACETAMINOPHEN 160 MG/5ML PO SOLN
650.0000 mg | Freq: Four times a day (QID) | ORAL | Status: DC | PRN
  Administered 2016-12-09: 650 mg
  Filled 2016-12-09: qty 20.3

## 2016-12-09 MED ORDER — NOREPINEPHRINE 16 MG/250ML-% IV SOLN
0.0000 ug/min | INTRAVENOUS | Status: DC
  Administered 2016-12-09: 25 ug/min via INTRAVENOUS
  Filled 2016-12-09 (×2): qty 250

## 2016-12-09 MED ORDER — ONDANSETRON HCL 4 MG/2ML IJ SOLN: 4.0000 mg | Freq: Four times a day (QID) | INTRAMUSCULAR | Status: DC | PRN

## 2016-12-09 MED ORDER — NOREPINEPHRINE BITARTRATE 1 MG/ML IV SOLN
0.0000 ug/min | INTRAVENOUS | Status: DC
  Filled 2016-12-09: qty 16

## 2016-12-09 MED ORDER — VASOPRESSIN 20 UNIT/ML IV SOLN
0.0300 [IU]/min | INTRAVENOUS | Status: DC
  Administered 2016-12-09 – 2016-12-13 (×5): 0.03 [IU]/min via INTRAVENOUS
  Filled 2016-12-09 (×5): qty 2

## 2016-12-09 MED ORDER — SODIUM CHLORIDE 0.9 % IV BOLUS (SEPSIS)
1000.0000 mL | Freq: Once | INTRAVENOUS | Status: AC
  Administered 2016-12-09: 1000 mL via INTRAVENOUS

## 2016-12-09 MED ORDER — NOREPINEPHRINE BITARTRATE 1 MG/ML IV SOLN
0.0000 ug/min | INTRAVENOUS | Status: DC
  Filled 2016-12-09: qty 4

## 2016-12-09 MED ORDER — PANTOPRAZOLE SODIUM 40 MG PO PACK
40.0000 mg | PACK | ORAL | Status: DC
  Administered 2016-12-09: 40 mg
  Filled 2016-12-09 (×2): qty 20

## 2016-12-09 MED ORDER — SODIUM CHLORIDE 0.9 % IV SOLN: Freq: Once | INTRAVENOUS | Status: DC

## 2016-12-09 MED ORDER — PRO-STAT SUGAR FREE PO LIQD
30.0000 mL | Freq: Two times a day (BID) | ORAL | Status: DC
  Administered 2016-12-09: 30 mL
  Filled 2016-12-09: qty 30

## 2016-12-09 MED ORDER — ATORVASTATIN CALCIUM 20 MG PO TABS
20.0000 mg | ORAL_TABLET | Freq: Every day | ORAL | Status: DC
  Administered 2016-12-09: 20 mg via ORAL
  Filled 2016-12-09 (×4): qty 1

## 2016-12-09 MED ORDER — VITAL AF 1.2 CAL PO LIQD
1000.0000 mL | ORAL | Status: DC
  Administered 2016-12-09 – 2016-12-10 (×2): 1000 mL

## 2016-12-09 MED ORDER — VITAL HIGH PROTEIN PO LIQD
1000.0000 mL | ORAL | Status: DC
  Administered 2016-12-09: 1000 mL
  Administered 2016-12-09: 15:00:00

## 2016-12-09 MED ORDER — MAGNESIUM SULFATE 4 GM/100ML IV SOLN
4.0000 g | Freq: Once | INTRAVENOUS | Status: AC
  Administered 2016-12-09: 4 g via INTRAVENOUS
  Filled 2016-12-09: qty 100

## 2016-12-09 MED ORDER — TAMSULOSIN HCL 0.4 MG PO CAPS
0.4000 mg | ORAL_CAPSULE | Freq: Every day | ORAL | Status: DC
  Administered 2016-12-13 – 2016-12-14 (×2): 0.4 mg via ORAL
  Filled 2016-12-09 (×7): qty 1

## 2016-12-09 MED ORDER — SODIUM CHLORIDE 0.9 % IV SOLN
Freq: Once | INTRAVENOUS | Status: AC
  Administered 2016-12-09: 01:00:00 via INTRAVENOUS

## 2016-12-09 MED ORDER — "THROMBI-PAD 3""X3"" EX PADS"
1.0000 | MEDICATED_PAD | Freq: Once | CUTANEOUS | Status: AC
  Administered 2016-12-09: 1 via TOPICAL
  Filled 2016-12-09: qty 1

## 2016-12-09 NOTE — Progress Notes (Signed)
RT note-pulse-ox probe changed to left hand, picking up reading better, continue to wean fio2 and peep as tolerated.

## 2016-12-09 NOTE — Progress Notes (Signed)
eLink Physician-Brief Progress Note Patient Name: Dontrelle Sieker DOB: 1932-01-29 MRN: [id number]   Date of Service  12/09/2016  HPI/Events of Note  inr noted  Oozing  ffp x 3  eICU Interventions       Intervention Category Intermediate Interventions: Bleeding - evaluation and treatment with blood products  Nelda Bucks. 12/09/2016, 12:07 AM

## 2016-12-09 NOTE — Progress Notes (Addendum)
Initial Nutrition Assessment  DOCUMENTATION CODES:   Non-severe (moderate) malnutrition in context of chronic illness  INTERVENTION:    Vital AF 1.2 at 70 ml/h (1680 ml per day)  Provides 2016 kcal, 126 gm protein, 1362 ml free water daily  NUTRITION DIAGNOSIS:   Malnutrition (moderate) related to chronic illness (CAD, CKD) as evidenced by mild depletion of body fat, mild depletion of muscle mass, percent weight loss (9% weight loss in 3 months).  GOAL:   Patient will meet greater than or equal to 90% of their needs  MONITOR:   Vent status, Labs, TF tolerance, Skin, I & O's  REASON FOR ASSESSMENT:   Consult Enteral/tube feeding initiation and management  ASSESSMENT:   83 yo male with PMH of HLD, lumbar spondylosis, MI, CAD, PAF, cardiomyopathy, CKD, CHF who was admitted on 8/30 with L hip fracture. He developed hypoxia and hypotension after surgery (L IM nail) on 9/4 and required re-intubation and transfer to the ICU.  Discussed patient in ICU rounds and with RN today. Received MD Consult for TF initiation and management. OG tube in place. Patient's family at bedside reports patient was eating okay PTA, but has been eating minimally since admission last week. PTA he was eating less than he usually does.  Nutrition-Focused physical exam completed. Findings are mild fat depletion, mild muscle depletion, and mild edema.   Patient is currently intubated on ventilator support MV: 13.2 L/min Temp (24hrs), Avg:97.9 F (36.6 C), Min:97.5 F (36.4 C), Max:100 F (37.8 C)  Propofol: none Labs reviewed: phosphorus 7 (H), magnesium 2.6 (H) Medications reviewed and include Flomax.  Diet Order:  Diet NPO time specified  Skin:   (L thigh incision)  Last BM:  9/4  Height:   Ht Readings from Last 1 Encounters:  12/08/16 6\' 1"  (1.854 m)    Weight:   Wt Readings from Last 1 Encounters:  12/07/16 193 lb 8 oz (87.8 kg)    Ideal Body Weight:  83.6 kg  BMI:  Body mass  index is 24.84 kg/m.  Estimated Nutritional Needs:   Kcal:  2075  Protein:  120-140 gm  Fluid:  2 L  EDUCATION NEEDS:   No education needs identified at this time  Joaquin Courts, RD, LDN, CNSC Pager [phone] After Hours Pager [phone]

## 2016-12-09 NOTE — Progress Notes (Addendum)
Anesthesiology Follow up Pt remains intubated and sedated on norepinephrine and vasopressin infusions. My presumptive diagnosis was urosepsis complicated by acute blood loss anemia and hypovolemia. UA with many bacteria. Cultures pending. On rocephin. Volume status seems to be corrected and Hgb 8.7 after transfusion. Vent settings being weaned, FiO2 down to 40%. UOP ~ 30 ml/hr per RN at bedside.   Vitals:   12/09/16 1557 12/09/16 1600  BP:  94/64  Pulse:    Resp:  20  Temp: 37.4 C   SpO2:  100%    I spoke with Mrs. Waage and Leonette Most (son) on the phones, and with son Rosalia Hammers) at bedside and explained the events of his PACU stay. I have asked that a safety zone portal be filed for this this case. Regardless, it will be discussed at anesthesia peer review.

## 2016-12-09 NOTE — Progress Notes (Signed)
PULMONARY / CRITICAL CARE MEDICINE   Name: Danny Oneill MRN: [id number] DOB: [birth year]    ADMISSION DATE:  12/03/2016 CONSULTATION DATE:  12/08/2016  REFERRING MD:  OR   CHIEF COMPLAINT:  Hypotension  HISTORY OF PRESENT ILLNESS:   83 yo male admitted with Lt hip comminuted fracture after fall at home.  Developed agitated delirium while in hospital.  Had Lt intertrochanteric nailing on 9/04 with EBL 200 ml.  Noted to have urine retention.  Post op had hypotension, progressive anemia, and required reintubation.    PMHx of CAD, systolic CHF with EF 45 to 50%, HLD, PAF, CKD, Asbestosis exposure, BPH.  SUBJECTIVE:  Remains on pressors.  Still on increased FiO2/PEEP, but pulse ox might not have been accurate >> better SpO2 reading with reposition of pulse ox.  VITAL SIGNS: BP (!) 87/61   Pulse (!) 110   Temp 97.7 F (36.5 C) (Oral)   Resp 19   Ht 6\' 1"  (1.854 m)   Wt 193 lb 8 oz (87.8 kg)   SpO2 98%   BMI 24.84 kg/m   HEMODYNAMICS: CVP:  [10 mmHg-14 mmHg] 10 mmHg  VENTILATOR SETTINGS: Vent Mode: PRVC FiO2 (%):  [40 %-100 %] 50 % Set Rate:  [18 bmp] 18 bmp Vt Set:  [550 mL-640 mL] 640 mL PEEP:  [5 cmH20-10 cmH20] 8 cmH20 Plateau Pressure:  [15 cmH20-16 cmH20] 15 cmH20  INTAKE / OUTPUT: I/O last 3 completed shifts: In: 5923.7 [I.V.:4147.9; Blood:1445.9; NG/GT:30; IV Piggyback:300] Out: 916 [Urine:815; Stool:1; Blood:100]  PHYSICAL EXAMINATION:  General - on vent Eyes - pupils reactive ENT - thin Cardiac - regular, no murmur Chest - b/l rhonchi Abd - soft, non tender Ext - no edema Skin - no rashes Neuro - RASS -1  LABS:  BMET  Recent Labs Lab 12/08/16 0523 12/08/16 2039 12/08/16 2123 12/09/16 0558  NA 136 142 138 137  K 4.4 3.9 3.8 3.7  CL 102  --  108 104  CO2 26  --  17* 18*  BUN 32*  --  41* 47*  CREATININE 1.02  --  1.57* 2.19*  GLUCOSE 121*  --  65 108*    Electrolytes  Recent Labs Lab 12/08/16 0523 12/08/16 2122 12/08/16 2123  12/09/16 0558  CALCIUM 8.5*  --  7.6* 7.6*  MG  --  1.6*  --   --   PHOS  --   --  4.6  --     CBC  Recent Labs Lab 12/05/16 0327  12/08/16 2122 12/08/16 2301 12/09/16 0558  WBC 7.0  --  1.3*  --  9.5  HGB 10.7*  < > 7.1* 9.4* 8.7*  HCT 33.3*  < > 21.5* 29.2* 25.7*  PLT 143*  --  PLATELET CLUMPS NOTED ON SMEAR, UNABLE TO ESTIMATE  --  104*  < > = values in this interval not displayed.  Coag's  Recent Labs Lab 12/08/16 2301  INR 2.34    Sepsis Markers  Recent Labs Lab 12/08/16 2120 12/09/16 0345  LATICACIDVEN 6.7* 6.5*    ABG  Recent Labs Lab 12/08/16 2039 12/09/16 0000 12/09/16 0546  PHART 7.369 7.276* 7.357  PCO2ART 29.7* 42.4 33.5  PO2ART 71.0* 383.0* 116.0*    Liver Enzymes  Recent Labs Lab 12/08/16 2123  ALBUMIN 2.1*    Cardiac Enzymes  Recent Labs Lab 12/08/16 2022 12/09/16 0558  TROPONINI 0.04* 0.06*    Glucose  Recent Labs Lab 12/08/16 2248 12/09/16 0422 12/09/16 0846  GLUCAP 92 106* 101*  Imaging Portable Chest Xray  Result Date: 12/08/2016 CLINICAL DATA:  Hypoxia EXAM: PORTABLE CHEST 1 VIEW COMPARISON:  December 03, 2016 FINDINGS: Endotracheal tube tip is 5.1 cm above the carina. Orogastric tube tip is at the gastroesophageal junction with the side port above the gastroesophageal junction in the distal esophagus. Central catheter tip is in superior vena cava. No pneumothorax. There are multiple foci of pleural calcification as well as calcification along each hemidiaphragm. There is mild scarring in each mid lung. There is no edema or consolidation. Heart is upper normal in size with pulmonary vascularity within normal limits. There is aortic atherosclerosis. No adenopathy. No focal bone lesions. IMPRESSION: Tube and catheter positions as described without pneumothorax. Note that the orogastric tube side port is above the gastroesophageal junction. Advise advancing orogastric tube approximately 10 cm to insure that tube tip and  side port are within the stomach. Areas of previous asbestos exposure. Areas of mild scarring. No edema or consolidation. Heart upper normal in size.  Areas of aortic atherosclerosis. Aortic Atherosclerosis (ICD10-I70.0). Electronically Signed   By: Bretta Bang III M.D.   On: 12/08/2016 21:35   Dg C-arm 1-60 Min  Result Date: 12/08/2016 CLINICAL DATA:  Left hip IM nail EXAM: DG C-ARM 61-120 MIN; OPERATIVE LEFT HIP WITH PELVIS COMPARISON:  12/03/2016 FINDINGS: Four low resolution intraoperative spot views of the left hip. Total fluoroscopy time was 55 seconds. Images demonstrate intramedullary rod and screw fixation of the proximal left femur across a trochanteric fracture. IMPRESSION: Intraoperative fluoroscopic assistance provided during surgical fixation of left femoral fracture Electronically Signed   By: Jasmine Pang M.D.   On: 12/08/2016 16:59   Dg Hip Operative Unilat W Or W/o Pelvis Left  Result Date: 12/08/2016 CLINICAL DATA:  Left hip IM nail EXAM: DG C-ARM 61-120 MIN; OPERATIVE LEFT HIP WITH PELVIS COMPARISON:  12/03/2016 FINDINGS: Four low resolution intraoperative spot views of the left hip. Total fluoroscopy time was 55 seconds. Images demonstrate intramedullary rod and screw fixation of the proximal left femur across a trochanteric fracture. IMPRESSION: Intraoperative fluoroscopic assistance provided during surgical fixation of left femoral fracture Electronically Signed   By: Jasmine Pang M.D.   On: 12/08/2016 16:59    STUDIES:   CULTURES: 9/4 BC >> 9/4 UC >>  ANTIBIOTICS: 9/4 ceftriaxone >>  SIGNIFICANT EVENTS: 8/30 Admit 9/4  Surgery/ tx to ICU for hypotension  LINES/TUBES: 9/4 Left Radial Aline >> 9/4 R TL IJ CVC >>  9/4 ETT >>   DISCUSSION: 6 yoM with extensive cardiac PMH on brilinta with left hip fx s/p mechanical fall on 8/30.  Developed some acute delirium.  Surgery performed on 9/4 with post-operative complications of hypotension, urinary retention, Hgb  drop of 3 gm.  Intubated for airway protection.  ASSESSMENT / PLAN:  PULMONARY A: Compromised airway after surgery. Asbestos related pleural plaques. P:   Full vent support F/u CXR  CARDIOVASCULAR A:  Hypovolemic shock. Hx of CAD s/p DES 02/2016, chronic combined CHF, HLD, PAF. P:  Continue IV fluids Pressors to keep MAP > 65  RENAL A:   Acute renal failure from ATN. CKD 3 >> Baseline creatinine 1.56 from 10/14/16. Hx of BPH with urine retention. P:   Even fluid balance  GASTROINTESTINAL A:   Nutrition. P:   Tube feeds  HEMATOLOGIC A:   Acute blood loss anemia after surgery. P:  F/u CBC Transfuse for Hb < 7  INFECTIOUS A:   UTI. P:   Day 2 of rocephin  ENDOCRINE  A:   No acute issues. P:   Monitor blood sugars  NEUROLOGIC A:   Acute metabolic encephalopathy. P:   RASS goal 0 to -1  Updated family at bedside.  CC time 33 minutes  Coralyn Helling, MD [facility] Pulmonary/Critical Care 12/09/2016, 11:18 AM Pager:  [phone] After 3pm call: [phone]

## 2016-12-09 NOTE — Progress Notes (Addendum)
   Subjective:  Patient is intubated and sedated at this time. Unfortunately yesterday and the PACU after a fairly uneventful intraoperative course he developed hypotension and hypoxemia. He was emergently intubated and on screening CBC he did have acute blood loss anemia and required 2 units of packed red blood cells at that time. His hypotension persisted and was placed on pressors in the PACU and transferred to the ICU. He's been stable overnight on the ventilator still getting pressors for labile blood pressure. Per the nurse this morning he has been doing okay and she reports no issues with his hip incisions or left thigh.  Objective:   VITALS:   Vitals:   12/09/16 1100 12/09/16 1200 12/09/16 1219 12/09/16 1232  BP: (!) 75/58 (!) 68/56    Pulse:      Resp: (!) 21 (!) 23    Temp:   100 F (37.8 C)   TempSrc:   Oral   SpO2: 99% 100%  100%  Weight:      Height:        Examination of left leg: Bandages are clean dry and intact. The postoperative Ioban dressing is been reinforced with ABDs which are clean at this time. The thigh is soft and compressible no signs of thigh compartment syndrome. The calf likewise is soft and compressible. He does respond to pain throughout the left leg. No focal neuromotor exam able to be initiated. He does move spontaneously throughout the left lower extremity. 2+ dorsalis pedis pulse.  Lab Results  Component Value Date   WBC 9.5 12/09/2016   HGB 8.9 (L) 12/09/2016   HCT 27.3 (L) 12/09/2016   MCV 89.5 12/09/2016   PLT 104 (L) 12/09/2016   BMET    Component Value Date/Time   NA 137 12/09/2016 0558   NA 132 (A) 02/17/2016   K 3.7 12/09/2016 0558   CL 104 12/09/2016 0558   CO2 18 (L) 12/09/2016 0558   GLUCOSE 108 (H) 12/09/2016 0558   BUN 47 (H) 12/09/2016 0558   BUN 37 (A) 02/17/2016   CREATININE 2.19 (H) 12/09/2016 0558   CREATININE 1.25 (H) 03/16/2016 1229   CALCIUM 7.6 (L) 12/09/2016 0558   GFRNONAA 26 (L) 12/09/2016 0558   GFRAA 30 (L)  12/09/2016 0558     Assessment/Plan: 1 Day Post-Op   Principal Problem:   Closed left hip fracture (HCC) Active Problems:   Hyperlipidemia   CKD (chronic kidney disease), stage III   Hip fracture (HCC)  - Critical care per the ICU team.  -From a orthopedic standpoint and does not appear to be any expanding thigh hematoma or postoperative bleed. He is okay for 50% weightbearing to the left lower extremity. He will be appropriate for physical and occupational therapy once cleared from a critical-care standpoint.  - We will follow along.  - I spent a fair amount of time with his family at the bedside. We discussed the nature of his critical condition at this time. They understand that this is a tenuous situation and given his advanced age he has a guarded prognosis at this time.   Yolonda Kida 12/09/2016, 12:49 PM   Maryan Rued, MD [phone]

## 2016-12-09 NOTE — Progress Notes (Signed)
RT note-Patient has weaned for 4 hour this afternoon, PS 10/5

## 2016-12-09 NOTE — Progress Notes (Signed)
eLink Physician-Brief Progress Note Patient Name: Danny Oneill DOB: Sep 16, 1931 MRN: [id number]   Date of Service  12/09/2016  HPI/Events of Note  Remains in shock, levo to 30 Drop in urine output over last 6 hours   Add vaso Get cvp Assess urine osm, na abg  eICU Interventions       Intervention Category Major Interventions: Shock - evaluation and management  Anastasios Melander J. 12/09/2016, 5:33 AM

## 2016-12-09 NOTE — Progress Notes (Signed)
eLink Physician-Brief Progress Note Patient Name: Danny Oneill DOB: 03-15-1932 MRN: [id number]   Date of Service  12/09/2016  HPI/Events of Note  abg reviewed Keep same MV Reduce peep  eICU Interventions       Intervention Category Major Interventions: Hypoxemia - evaluation and management  Nelda Bucks. 12/09/2016, 5:54 AM

## 2016-12-09 NOTE — Progress Notes (Signed)
PT Cancellation Note  Patient Details Name: Danny Oneill MRN: [id number] DOB: [birth year]   Cancelled Treatment:    Reason Eval/Treat Not Completed: Medical issues which prohibited therapy Pt on strict bedrest. Will await increase in activity orders prior to PT evaluation.   [name] 12/09/2016, 8:15 AM Mylo Red, PT, DPT [phone]

## 2016-12-09 NOTE — Progress Notes (Signed)
RT note- Patient spo2 87%, recruited with PC,sp02 now 90-92%

## 2016-12-10 ENCOUNTER — Inpatient Hospital Stay (HOSPITAL_COMMUNITY): Payer: PPO

## 2016-12-10 DIAGNOSIS — R571 Hypovolemic shock: Secondary | ICD-10-CM

## 2016-12-10 DIAGNOSIS — I9589 Other hypotension: Secondary | ICD-10-CM

## 2016-12-10 DIAGNOSIS — J988 Other specified respiratory disorders: Secondary | ICD-10-CM

## 2016-12-10 DIAGNOSIS — R748 Abnormal levels of other serum enzymes: Secondary | ICD-10-CM

## 2016-12-10 LAB — CBC
HCT: 25.7 % — ABNORMAL LOW (ref 39.0–52.0)
HEMOGLOBIN: 8.6 g/dL — AB (ref 13.0–17.0)
MCH: 30 pg (ref 26.0–34.0)
MCHC: 33.5 g/dL (ref 30.0–36.0)
MCV: 89.5 fL (ref 78.0–100.0)
Platelets: 124 10*3/uL — ABNORMAL LOW (ref 150–400)
RBC: 2.87 MIL/uL — AB (ref 4.22–5.81)
RDW: 17.4 % — ABNORMAL HIGH (ref 11.5–15.5)
WBC: 21.6 10*3/uL — ABNORMAL HIGH (ref 4.0–10.5)

## 2016-12-10 LAB — BPAM FFP
BLOOD PRODUCT EXPIRATION DATE: 201809092359
BLOOD PRODUCT EXPIRATION DATE: 201809102359
Blood Product Expiration Date: 201809052359
ISSUE DATE / TIME: 201809050048
ISSUE DATE / TIME: 201809050048
ISSUE DATE / TIME: 201809050048
UNIT TYPE AND RH: 5100
UNIT TYPE AND RH: 6200
Unit Type and Rh: 7300

## 2016-12-10 LAB — PREPARE FRESH FROZEN PLASMA
UNIT DIVISION: 0
UNIT DIVISION: 0
Unit division: 0

## 2016-12-10 LAB — POCT I-STAT 3, ART BLOOD GAS (G3+)
ACID-BASE DEFICIT: 2 mmol/L (ref 0.0–2.0)
Bicarbonate: 22.4 mmol/L (ref 20.0–28.0)
O2 Saturation: 99 %
PH ART: 7.411 (ref 7.350–7.450)
PO2 ART: 125 mmHg — AB (ref 83.0–108.0)
Patient temperature: 98.9
TCO2: 23 mmol/L (ref 22–32)
pCO2 arterial: 35.3 mmHg (ref 32.0–48.0)

## 2016-12-10 LAB — BASIC METABOLIC PANEL
ANION GAP: 12 (ref 5–15)
BUN: 53 mg/dL — ABNORMAL HIGH (ref 6–20)
CALCIUM: 7.3 mg/dL — AB (ref 8.9–10.3)
CO2: 19 mmol/L — ABNORMAL LOW (ref 22–32)
Chloride: 105 mmol/L (ref 101–111)
Creatinine, Ser: 1.98 mg/dL — ABNORMAL HIGH (ref 0.61–1.24)
GFR, EST AFRICAN AMERICAN: 34 mL/min — AB (ref >60)
GFR, EST NON AFRICAN AMERICAN: 30 mL/min — AB (ref >60)
Glucose, Bld: 145 mg/dL — ABNORMAL HIGH (ref 65–99)
Potassium: 3.9 mmol/L (ref 3.5–5.1)
Sodium: 136 mmol/L (ref 135–145)

## 2016-12-10 LAB — GLUCOSE, CAPILLARY
GLUCOSE-CAPILLARY: 139 mg/dL — AB (ref 65–99)
GLUCOSE-CAPILLARY: 139 mg/dL — AB (ref 65–99)

## 2016-12-10 LAB — PHOSPHORUS: Phosphorus: 5.1 mg/dL — ABNORMAL HIGH (ref 2.5–4.6)

## 2016-12-10 LAB — TROPONIN I: TROPONIN I: 0.07 ng/mL — AB (ref <0.03)

## 2016-12-10 LAB — MAGNESIUM: MAGNESIUM: 2.6 mg/dL — AB (ref 1.7–2.4)

## 2016-12-10 MED ORDER — ACETAMINOPHEN 160 MG/5ML PO SOLN: 650.0000 mg | Freq: Four times a day (QID) | ORAL | Status: DC | PRN

## 2016-12-10 MED ORDER — SODIUM CHLORIDE 0.9 % IV BOLUS (SEPSIS)
1000.0000 mL | Freq: Once | INTRAVENOUS | Status: AC
  Administered 2016-12-10: 1000 mL via INTRAVENOUS

## 2016-12-10 MED ORDER — PANTOPRAZOLE SODIUM 40 MG PO PACK
40.0000 mg | PACK | ORAL | Status: DC
  Filled 2016-12-10 (×2): qty 20

## 2016-12-10 MED ORDER — FENTANYL CITRATE (PF) 100 MCG/2ML IJ SOLN: 25.0000 ug | INTRAMUSCULAR | Status: DC | PRN

## 2016-12-10 NOTE — Progress Notes (Signed)
Progress Note  Patient Name: Danny Oneill Date of Encounter: 12/10/2016  Primary Cardiologist: Ellouise Newer  Subjective   Intubated. Relative is at bedside.  Inpatient Medications    Scheduled Meds: . atorvastatin  20 mg Oral Daily  . bacitracin  1 application Topical BID  . chlorhexidine gluconate (MEDLINE KIT)  15 mL Mouth Rinse BID  . mouth rinse  15 mL Mouth Rinse QID  . pantoprazole sodium  40 mg Per Tube Q24H  . sodium chloride flush  3 mL Intravenous Q12H  . tamsulosin  0.4 mg Oral Daily   Continuous Infusions: . cefTRIAXone (ROCEPHIN)  IV Stopped (12/09/16 2355)  . dextrose 5% lactated ringers 75 mL/hr at 12/10/16 0403  . feeding supplement (VITAL AF 1.2 CAL) 1,000 mL (12/10/16 0834)  . norepinephrine (LEVOPHED) Adult infusion 35 mcg/min (12/10/16 0500)  . vasopressin (PITRESSIN) infusion - *FOR SHOCK* 0.03 Units/min (12/10/16 0400)   PRN Meds: acetaminophen (TYLENOL) oral liquid 160 mg/5 mL, bisacodyl, docusate, fentaNYL (SUBLIMAZE) injection, midazolam, ondansetron   Vital Signs    Vitals:   12/10/16 0700 12/10/16 0727 12/10/16 0733 12/10/16 0800  BP: 108/71  (!) 134/49 101/68  Pulse: (!) 114  (!) 114   Resp: (!) 25  (!) 28   Temp:  99.4 F (37.4 C)    TempSrc:  Oral    SpO2: 99%  100%   Weight:      Height:        Intake/Output Summary (Last 24 hours) at 12/10/16 0840 Last data filed at 12/10/16 0800  Gross per 24 hour  Intake           4253.9 ml  Output             1900 ml  Net           2353.9 ml   Filed Weights   12/07/16 0449 12/09/16 0500 12/10/16 0300  Weight: 193 lb 8 oz (87.8 kg) 193 lb 9 oz (87.8 kg) 215 lb 6.2 oz (97.7 kg)    Telemetry    Sinus tachycardia - Personally Reviewed  ECG    Performed on 12/09/2016 demonstrated sinus rhythm without acute ST-T wave changes or Q waves. - Personally Reviewed  Physical Exam  Frail, elderly, intubated. GEN: No acute distress.   Neck:  difficult to assess JVD Cardiac: RRR, , tachycardic,  significant support device noise prevents accurate exam. Respiratory: Clear to auscultation bilaterally. GI: Soft, nontender, non-distended  MS: . Ecchymoses on extremities Neuro:   intubated and sedated Psych: Intubated and sedated  Labs    Chemistry Recent Labs Lab 12/08/16 2123 12/09/16 0558 12/10/16 0256  NA 138 137 136  K 3.8 3.7 3.9  CL 108 104 105  CO2 17* 18* 19*  GLUCOSE 65 108* 145*  BUN 41* 47* 53*  CREATININE 1.57* 2.19* 1.98*  CALCIUM 7.6* 7.6* 7.3*  ALBUMIN 2.1*  --   --   GFRNONAA 39* 26* 30*  GFRAA 45* 30* 34*  ANIONGAP '13 15 12     ' Hematology Recent Labs Lab 12/08/16 2122  12/09/16 0558 12/09/16 1139 12/10/16 0256  WBC 1.3*  --  9.5  --  21.6*  RBC 2.29*  --  2.87*  --  2.87*  HGB 7.1*  < > 8.7* 8.9* 8.6*  HCT 21.5*  < > 25.7* 27.3* 25.7*  MCV 93.9  --  89.5  --  89.5  MCH 31.0  --  30.3  --  30.0  MCHC 33.0  --  33.9  --  33.5  RDW 15.7*  --  17.5*  --  17.4*  PLT PLATELET CLUMPS NOTED ON SMEAR, UNABLE TO ESTIMATE  --  104*  --  124*  < > = values in this interval not displayed.  Cardiac Enzymes Recent Labs Lab 12/08/16 2022 12/09/16 0558 12/09/16 1139  TROPONINI 0.04* 0.06* 0.07*   No results for input(s): TROPIPOC in the last 168 hours.   BNPNo results for input(s): BNP, PROBNP in the last 168 hours.   DDimer No results for input(s): DDIMER in the last 168 hours.   Radiology    Dg Chest Port 1 View  Result Date: 12/10/2016 CLINICAL DATA:  Respiratory failure, asbestosis, coronary artery disease. EXAM: PORTABLE CHEST 1 VIEW COMPARISON:  Portable chest x-ray of December 08, 2016 FINDINGS: The lungs are adequately inflated. The interstitial markings remain coarse. Calcified pleural plaques are present bilaterally. There is no alveolar infiltrate. There is no significant pleural effusion and no pneumothorax. The heart is mildly enlarged. The pulmonary vascularity is not engorged. The endotracheal tube tip projects 5.3 above the carina.  The esophagogastric tube tip and proximal port project below the inferior margin of the image. The right internal jugular venous catheter tip projects over the midportion of the SVC. There is calcification in the wall of the thoracic aorta. IMPRESSION: Interval advancement of the esophagogastric tube since at both the tip and proximal port project below the expected location of the GE junction. Stable pleuro parenchymal changes consistent with asbestosis. No acute pneumonia nor pulmonary edema. Mild cardiomegaly without pulmonary vascular congestion. Thoracic aortic atherosclerosis. Electronically Signed   By: David  Martinique M.D.   On: 12/10/2016 07:11   Portable Chest Xray  Result Date: 12/08/2016 CLINICAL DATA:  Hypoxia EXAM: PORTABLE CHEST 1 VIEW COMPARISON:  December 03, 2016 FINDINGS: Endotracheal tube tip is 5.1 cm above the carina. Orogastric tube tip is at the gastroesophageal junction with the side port above the gastroesophageal junction in the distal esophagus. Central catheter tip is in superior vena cava. No pneumothorax. There are multiple foci of pleural calcification as well as calcification along each hemidiaphragm. There is mild scarring in each mid lung. There is no edema or consolidation. Heart is upper normal in size with pulmonary vascularity within normal limits. There is aortic atherosclerosis. No adenopathy. No focal bone lesions. IMPRESSION: Tube and catheter positions as described without pneumothorax. Note that the orogastric tube side port is above the gastroesophageal junction. Advise advancing orogastric tube approximately 10 cm to insure that tube tip and side port are within the stomach. Areas of previous asbestos exposure. Areas of mild scarring. No edema or consolidation. Heart upper normal in size.  Areas of aortic atherosclerosis. Aortic Atherosclerosis (ICD10-I70.0). Electronically Signed   By: Lowella Grip III M.D.   On: 12/08/2016 21:35   Dg C-arm 1-60 Min  Result  Date: 12/08/2016 CLINICAL DATA:  Left hip IM nail EXAM: DG C-ARM 61-120 MIN; OPERATIVE LEFT HIP WITH PELVIS COMPARISON:  12/03/2016 FINDINGS: Four low resolution intraoperative spot views of the left hip. Total fluoroscopy time was 55 seconds. Images demonstrate intramedullary rod and screw fixation of the proximal left femur across a trochanteric fracture. IMPRESSION: Intraoperative fluoroscopic assistance provided during surgical fixation of left femoral fracture Electronically Signed   By: Donavan Foil M.D.   On: 12/08/2016 16:59   Dg Hip Operative Unilat W Or W/o Pelvis Left  Result Date: 12/08/2016 CLINICAL DATA:  Left hip IM nail EXAM: DG C-ARM 61-120 MIN; OPERATIVE  LEFT HIP WITH PELVIS COMPARISON:  12/03/2016 FINDINGS: Four low resolution intraoperative spot views of the left hip. Total fluoroscopy time was 55 seconds. Images demonstrate intramedullary rod and screw fixation of the proximal left femur across a trochanteric fracture. IMPRESSION: Intraoperative fluoroscopic assistance provided during surgical fixation of left femoral fracture Electronically Signed   By: Donavan Foil M.D.   On: 12/08/2016 16:59    Cardiac Studies   2-D Doppler echocardiogram 05/14/2016 Study Conclusions  - Left ventricle: The cavity size was normal. Wall thickness was   normal. Systolic function was mildly reduced. The estimated   ejection fraction was in the range of 45% to 50%. There is   hypokinesis of the mid-apicalinferolateral and inferior   myocardium. Doppler parameters are consistent with abnormal left   ventricular relaxation (grade 1 diastolic dysfunction). - Aortic valve: There was mild regurgitation. - Aortic root: The aortic root was mildly dilated. - Ascending aorta: The ascending aorta was mildly dilated. - Mitral valve: There was mild regurgitation. - Pulmonary arteries: Systolic pressure was moderately increased.   PA peak pressure: 51 mm Hg (S).  Impressions:  - Hypokinesis of the  distal inferior and inferolateral walls with   overall mildly reduced LV systolic function; grade 1 diastolic   dysfunction; sclerotic aortic valve with mild AI; mild MR; mild   TR with moderately elevated pulmonary pressure; mild dilated   aortic root (4.3 cm).    Patient Profile     83 y.o. 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 Cathlean Sauer. It was recommended that surgery he delayed to allow 5 days washed out of Brilinta to avoid bleeding. Postop developed progressive hypotension, anemia, and reintubation after hip replacement on 12/08/2016.  Assessment & Plan    1. Postoperative hypotension of uncertain etiology. Doubt cardiac/cardiogenic. No significant rise in cardiac markers. We will recheck an EKG and troponin today. 2. CAD with prior LAD stent. Same as above. 3. Acute on chronic kidney failure, stage IV  Signed, Sinclair Grooms, MD  12/10/2016, 8:40 AM

## 2016-12-10 NOTE — Procedures (Signed)
Extubation Procedure Note  Patient Details:   Name: Danny Oneill DOB: 23-Oct-1931 MRN: [id number]   Airway Documentation:     Evaluation  O2 sats: stable throughout Complications: No apparent complications Patient did tolerate procedure well. Bilateral Breath Sounds: Other (Comment) (Coarse)   Yes pt able to vocalize.  Pt extubated at this time per MD order. Pt was able to breathe around deflated cuff. Pt placed on 4L [place]. No stridor noted. IS attempted. Pt unable to perform. Unaware if pt understood or was able to hear instructions. MD made aware.    [name] 12/10/2016, 9:38 AM

## 2016-12-10 NOTE — Progress Notes (Addendum)
   Subjective:  Patient currently extubated and not sedated. Not very responsive however somewhat somnolent. He does appear comfortable. Per my discussion with his family in the ICU nurse team he is going to be placed back on BiPAP for his difficulty and is extubation trial morning. Otherwise no overnight issues per the nurse. He continues to require 2 pressors to maintain maps above 65.  Objective:   VITALS:   Vitals:   12/10/16 0900 12/10/16 0915 12/10/16 1135 12/10/16 1137  BP: 101/63  (!) 116/46   Pulse: (!) 117 (!) 112 (!) 115   Resp: (!) 29 (!) 24 (!) 26   Temp:    98.4 F (36.9 C)  TempSrc:    Axillary  SpO2: 97% 97% 94%   Weight:      Height:        Examination of left leg: Bandages are clean dry and intact. The postoperative [place] dressing is been reinforced with ABDs which as mild to moderate drainage noted on the proximal half.. The thigh is soft and compressible no signs of thigh compartment syndrome. The calf likewise is soft and compressible. He does respond to pain throughout the left leg. No focal neuromotor exam able to be initiated. He does move spontaneously throughout the left lower extremity. 2+ dorsalis pedis pulse.  Lab Results  Component Value Date   WBC 21.6 (H) 12/10/2016   HGB 8.6 (L) 12/10/2016   HCT 25.7 (L) 12/10/2016   MCV 89.5 12/10/2016   PLT 124 (L) 12/10/2016   BMET    Component Value Date/Time   NA 136 12/10/2016 0256   NA 132 (A) 02/17/2016   K 3.9 12/10/2016 0256   CL 105 12/10/2016 0256   CO2 19 (L) 12/10/2016 0256   GLUCOSE 145 (H) 12/10/2016 0256   BUN 53 (H) 12/10/2016 0256   BUN 37 (A) 02/17/2016   CREATININE 1.98 (H) 12/10/2016 0256   CREATININE 1.25 (H) 03/16/2016 1229   CALCIUM 7.3 (L) 12/10/2016 0256   GFRNONAA 30 (L) 12/10/2016 0256   GFRAA 34 (L) 12/10/2016 0256     Assessment/Plan: 2 Days Post-Op   Principal Problem:   Closed left hip fracture (HCC) Active Problems:   Hyperlipidemia   CKD (chronic kidney  disease), stage III   Hip fracture (HCC)   Compromised airway   Hypovolemic shock (HCC)  - Critical care per the ICU team.  -From a orthopedic standpoint okay for 50% weightbearing to the left lower extremity.  - We will follow along.  - moderate malnutrition- agree with dietician recs  - DVT prophylaxis with SCDs, we'll defer to critical care team4 when to re-state chemical prophylaxis.  Yolonda Kida 12/10/2016, 11:54 AM   Maryan Rued, MD [phone]

## 2016-12-10 NOTE — Progress Notes (Signed)
PULMONARY / CRITICAL CARE MEDICINE   Name: Danny Oneill MRN: [id number] DOB: 07/09/1931    ADMISSION DATE:  12/03/2016 CONSULTATION DATE:  12/08/2016  REFERRING MD:  OR   CHIEF COMPLAINT:  Hypotension  HISTORY OF PRESENT ILLNESS:   83 yo male admitted with Lt hip comminuted fracture after fall at home.  Developed agitated delirium while in hospital.  Had Lt intertrochanteric nailing on 9/04 with EBL 200 ml.  Noted to have urine retention.  Post op had hypotension, progressive anemia, and required reintubation.    PMHx of CAD, systolic CHF with EF 45 to 50%, HLD, PAF, CKD, Asbestosis exposure, BPH.  SUBJECTIVE:  Remains on pressors.  Tolerating SBT.  VITAL SIGNS: BP 101/68   Pulse (!) 114   Temp 99.4 F (37.4 C) (Oral)   Resp (!) 28   Ht 6\' 1"  (1.854 m)   Wt 215 lb 6.2 oz (97.7 kg)   SpO2 100%   BMI 28.42 kg/m   HEMODYNAMICS: CVP:  [11 mmHg-14 mmHg] 11 mmHg  VENTILATOR SETTINGS: Vent Mode: CPAP;PSV FiO2 (%):  [40 %-70 %] 40 % Set Rate:  [15 bmp] 15 bmp Vt Set:  [610 mL] 610 mL PEEP:  [5 cmH20-8 cmH20] 5 cmH20 Pressure Support:  [5 cmH20-10 cmH20] 5 cmH20 Plateau Pressure:  [18 cmH20] 18 cmH20  INTAKE / OUTPUT: I/O last 3 completed shifts: In: 7547.3 [I.V.:4395.5; Blood:1445.9; NG/GT:1306; IV Piggyback:400] Out: 1765 [Urine:1765]  PHYSICAL EXAMINATION:  General - alert Eyes - pupils reactive ENT - ETT in place Cardiac - regular, tachycardic, no murmur Chest - no wheeze, rales Abd - soft, non tender Ext - no edema Skin - no rashes Neuro - moves extremities   LABS:  BMET  Recent Labs Lab 12/08/16 2123 12/09/16 0558 12/10/16 0256  NA 138 137 136  K 3.8 3.7 3.9  CL 108 104 105  CO2 17* 18* 19*  BUN 41* 47* 53*  CREATININE 1.57* 2.19* 1.98*  GLUCOSE 65 108* 145*    Electrolytes  Recent Labs Lab 12/08/16 2123 12/09/16 0558 12/09/16 1139 12/09/16 1623 12/10/16 0256  CALCIUM 7.6* 7.6*  --   --  7.3*  MG  --   --  2.6* 2.7* 2.6*  PHOS 4.6   --  7.0* 6.6* 5.1*    CBC  Recent Labs Lab 12/08/16 2122  12/09/16 0558 12/09/16 1139 12/10/16 0256  WBC 1.3*  --  9.5  --  21.6*  HGB 7.1*  < > 8.7* 8.9* 8.6*  HCT 21.5*  < > 25.7* 27.3* 25.7*  PLT PLATELET CLUMPS NOTED ON SMEAR, UNABLE TO ESTIMATE  --  104*  --  124*  < > = values in this interval not displayed.  Coag's  Recent Labs Lab 12/08/16 2301  INR 2.34    Sepsis Markers  Recent Labs Lab 12/08/16 2120 12/09/16 0345  LATICACIDVEN 6.7* 6.5*    ABG  Recent Labs Lab 12/09/16 0000 12/09/16 0546 12/10/16 0302  PHART 7.276* 7.357 7.411  PCO2ART 42.4 33.5 35.3  PO2ART 383.0* 116.0* 125.0*    Liver Enzymes  Recent Labs Lab 12/08/16 2123  ALBUMIN 2.1*    Cardiac Enzymes  Recent Labs Lab 12/08/16 2022 12/09/16 0558 12/09/16 1139  TROPONINI 0.04* 0.06* 0.07*    Glucose  Recent Labs Lab 12/09/16 1218 12/09/16 1556 12/09/16 1933 12/09/16 2306 12/10/16 0317 12/10/16 0724  GLUCAP 96 114* 128* 150* 139* 139*    Imaging Dg Chest Port 1 View  Result Date: 12/10/2016 CLINICAL DATA:  Respiratory failure,  asbestosis, coronary artery disease. EXAM: PORTABLE CHEST 1 VIEW COMPARISON:  Portable chest x-ray of December 08, 2016 FINDINGS: The lungs are adequately inflated. The interstitial markings remain coarse. Calcified pleural plaques are present bilaterally. There is no alveolar infiltrate. There is no significant pleural effusion and no pneumothorax. The heart is mildly enlarged. The pulmonary vascularity is not engorged. The endotracheal tube tip projects 5.3 above the carina. The esophagogastric tube tip and proximal port project below the inferior margin of the image. The right internal jugular venous catheter tip projects over the midportion of the SVC. There is calcification in the wall of the thoracic aorta. IMPRESSION: Interval advancement of the esophagogastric tube since at both the tip and proximal port project below the expected location of  the GE junction. Stable pleuro parenchymal changes consistent with asbestosis. No acute pneumonia nor pulmonary edema. Mild cardiomegaly without pulmonary vascular congestion. Thoracic aortic atherosclerosis. Electronically Signed   By: David  [place] M.D.   On: 12/10/2016 07:11    STUDIES:   CULTURES: 9/4 BC >> 9/4 UC >>  ANTIBIOTICS: 9/4 ceftriaxone >>  SIGNIFICANT EVENTS: 8/30 Admit 9/4  Surgery/ tx to ICU for hypotension  LINES/TUBES: 9/4 Left Radial Aline >> 9/4 R TL IJ CVC >>  9/4 ETT >>   DISCUSSION: 20 yoM with extensive cardiac PMH on brilinta with left hip fx s/p mechanical fall on 8/30.  Developed some acute delirium.  Surgery performed on 9/4 with post-operative complications of hypotension, urinary retention, Hgb drop of 3 gm.  Intubated for airway protection.  ASSESSMENT / PLAN:  PULMONARY A: Compromised airway after surgery. Asbestos related pleural plaques. P:   Proceed with extubation trial 9/06  CARDIOVASCULAR A:  Hypovolemic shock. Hx of CAD s/p DES 02/2016, chronic combined CHF, HLD, PAF. P:  Continue IV fluids Wean pressors to keep MAP > 65  RENAL A:   Acute renal failure from ATN. CKD 3 >> Baseline creatinine 1.56 from 10/14/16. Hx of BPH with urine retention. P:   Continue IV fluids  GASTROINTESTINAL A:   Nutrition. P:   Advance diet after extubation  HEMATOLOGIC A:   Acute blood loss anemia after surgery. P:  F/u CBC  INFECTIOUS A:   UTI. P:   Day 3 of rocephin  ENDOCRINE A:   No acute issues. P:   Monitor blood sugars  NEUROLOGIC A:   Acute metabolic encephalopathy. P:   Monitor mental status after extubation  ORTHOPEDIC A: Lt intertrochantric intramedullary nailing 12/08/16. P: Post op care per ortho  Updated family at bedside  CC time 31 minutes  Coralyn Helling, MD [facility] Pulmonary/Critical Care 12/10/2016, 9:11 AM Pager:  [phone] After 3pm call: [phone]

## 2016-12-10 NOTE — Progress Notes (Signed)
Pts work of breathing still increased on 6L [place]. Respiratory tech called to come place pt back onto Bipap.

## 2016-12-11 DIAGNOSIS — R7989 Other specified abnormal findings of blood chemistry: Secondary | ICD-10-CM

## 2016-12-11 DIAGNOSIS — I2510 Atherosclerotic heart disease of native coronary artery without angina pectoris: Secondary | ICD-10-CM

## 2016-12-11 DIAGNOSIS — A4151 Sepsis due to Escherichia coli [E. coli]: Secondary | ICD-10-CM

## 2016-12-11 DIAGNOSIS — R778 Other specified abnormalities of plasma proteins: Secondary | ICD-10-CM

## 2016-12-11 DIAGNOSIS — I5042 Chronic combined systolic (congestive) and diastolic (congestive) heart failure: Secondary | ICD-10-CM

## 2016-12-11 LAB — MAGNESIUM: MAGNESIUM: 2.4 mg/dL (ref 1.7–2.4)

## 2016-12-11 LAB — GLUCOSE, CAPILLARY: Glucose-Capillary: 129 mg/dL — ABNORMAL HIGH (ref 65–99)

## 2016-12-11 LAB — URINE CULTURE: Culture: 10000 — AB

## 2016-12-11 LAB — PHOSPHORUS: PHOSPHORUS: 2.6 mg/dL (ref 2.5–4.6)

## 2016-12-11 MED ORDER — ASPIRIN EC 81 MG PO TBEC
81.0000 mg | DELAYED_RELEASE_TABLET | Freq: Every day | ORAL | Status: DC
  Administered 2016-12-13 – 2016-12-14 (×2): 81 mg via ORAL
  Filled 2016-12-11 (×3): qty 1

## 2016-12-11 MED ORDER — TICAGRELOR 90 MG PO TABS
90.0000 mg | ORAL_TABLET | Freq: Two times a day (BID) | ORAL | Status: DC
  Filled 2016-12-11 (×3): qty 1

## 2016-12-11 NOTE — Progress Notes (Signed)
PULMONARY / CRITICAL CARE MEDICINE   Name: Danny Oneill MRN: [id number] DOB: [birth year]    ADMISSION DATE:  12/03/2016 CONSULTATION DATE:  12/08/2016  REFERRING MD:  OR   CHIEF COMPLAINT:  Hypotension  HISTORY OF PRESENT ILLNESS:   83 yo male admitted with Lt hip comminuted fracture after fall at home.  Developed agitated delirium while in hospital.  Had Lt intertrochanteric nailing on 9/04 with EBL 200 ml.  Noted to have urine retention.  Post op had hypotension, progressive anemia, and required reintubation.  Found to have E coli UTI.   PMHx of CAD, systolic CHF with EF 45 to 50%, HLD, PAF, CKD, Asbestosis exposure, BPH.  SUBJECTIVE:  Off Bipap.  Still on pressors.  VITAL SIGNS: BP 116/68   Pulse (!) 102   Temp 100 F (37.8 C) (Axillary)   Resp (!) 27   Ht  (1.854 m)   Wt 215 lb 6.2 oz (97.7 kg)   SpO2 96%   BMI 28.42 kg/m   HEMODYNAMICS: CVP:  [6 mmHg-11 mmHg] 6 mmHg  VENTILATOR SETTINGS: FiO2 (%):  [40 %] 40 %  INTAKE / OUTPUT: I/O last 3 completed shifts: In: 5322.7 [I.V.:4147.5; NG/GT:1075.2; IV Piggyback:100] Out: 3450 [Urine:3450]  PHYSICAL EXAMINATION:  General - somnolent Eyes - pupils reactive ENT - dry mucosa Cardiac - regular, no murmur Chest - no wheeze, rales Abd - soft, non tender Ext - no edema Skin - no rashes Neuro - follows simple commands  LABS:  BMET  Recent Labs Lab 12/08/16 2123 12/09/16 0558 12/10/16 0256  NA 138 137 136  K 3.8 3.7 3.9  CL 108 104 105  CO2 17* 18* 19*  BUN 41* 47* 53*  CREATININE 1.57* 2.19* 1.98*  GLUCOSE 65 108* 145*    Electrolytes  Recent Labs Lab 12/08/16 2123 12/09/16 0558  12/09/16 1623 12/10/16 0256 12/11/16 0445  CALCIUM 7.6* 7.6*  --   --  7.3*  --   MG  --   --   < > 2.7* 2.6* 2.4  PHOS 4.6  --   < > 6.6* 5.1* 2.6  < > = values in this interval not displayed.  CBC  Recent Labs Lab 12/08/16 2122  12/09/16 0558 12/09/16 1139 12/10/16 0256  WBC 1.3*  --  9.5  --  21.6*   HGB 7.1*  < > 8.7* 8.9* 8.6*  HCT 21.5*  < > 25.7* 27.3* 25.7*  PLT PLATELET CLUMPS NOTED ON SMEAR, UNABLE TO ESTIMATE  --  104*  --  124*  < > = values in this interval not displayed.  Coag's  Recent Labs Lab 12/08/16 2301  INR 2.34    Sepsis Markers  Recent Labs Lab 12/08/16 2120 12/09/16 0345  LATICACIDVEN 6.7* 6.5*    ABG  Recent Labs Lab 12/09/16 0000 12/09/16 0546 12/10/16 0302  PHART 7.276* 7.357 7.411  PCO2ART 42.4 33.5 35.3  PO2ART 383.0* 116.0* 125.0*    Liver Enzymes  Recent Labs Lab 12/08/16 2123  ALBUMIN 2.1*    Cardiac Enzymes  Recent Labs Lab 12/09/16 0558 12/09/16 1139 12/10/16 1018  TROPONINI 0.06* 0.07* 0.07*    Glucose  Recent Labs Lab 12/09/16 1218 12/09/16 1556 12/09/16 1933 12/09/16 2306 12/10/16 0317 12/10/16 0724  GLUCAP 96 114* 128* 150* 139* 139*    Imaging No results found.  STUDIES:   CULTURES: 9/4 BC >> 9/4 UC >> E coli  ANTIBIOTICS: 9/4 ceftriaxone >>  SIGNIFICANT EVENTS: 8/30 Admit 9/4  Surgery/ tx to ICU for  hypotension  LINES/TUBES: 9/4 Left Radial Aline >> 9/4 R TL IJ CVC >>  9/4 ETT >> 9/6  DISCUSSION: 83 yo with fall and hip fx.  Developed shock and VDRF after surgery from E coli UTI with sepsis and blood loss after surgery.  ASSESSMENT / PLAN:  PULMONARY A: Compromised airway after surgery. Asbestos related pleural plaques. P:   Oxygen to keep SpO2 > 90% F/u CXR intermittently Bipap prn  CARDIOVASCULAR A:  Septic and hypovolemic shock. Hx of CAD s/p DES 02/2016, chronic combined CHF, HLD, PAF. P:  Continue IV fluids Wean pressors to keep MAP > 65 Resume ASA, brilinta 9/07  RENAL A:   Acute renal failure from ATN. CKD 3 >> Baseline creatinine 1.56 from 10/14/16. Hx of BPH with urine retention. P:   Continue IV fluids  GASTROINTESTINAL A:   Nutrition. P:   NPO until mental status improves  HEMATOLOGIC A:   Acute blood loss anemia after surgery. P:  F/u  CBC  INFECTIOUS A:   Sepsis from E coli UTI. P:   Day 4 of rocephin  ENDOCRINE A:   No acute issues. P:   Monitor blood sugars  NEUROLOGIC A:   Acute metabolic encephalopathy. P:   Monitor mental status  ORTHOPEDIC A: Lt intertrochantric intramedullary nailing 12/08/16. P: Post op care per ortho  Updated family at bedside  CC time 31 minutes  Coralyn Helling, MD [facility] Pulmonary/Critical Care 12/11/2016, 11:09 AM Pager:  [phone] After 3pm call: [phone]

## 2016-12-11 NOTE — Progress Notes (Signed)
Progress Note  Patient Name: Danny Oneill Date of Encounter: 12/11/2016  Primary Cardiologist: Corky Downs  Subjective   Extubated but difficult to arouse.  Inpatient Medications    Scheduled Meds: . [START ON 12/12/2016] aspirin EC  81 mg Oral Q breakfast  . atorvastatin  20 mg Oral Daily  . bacitracin  1 application Topical BID  . chlorhexidine gluconate (MEDLINE KIT)  15 mL Mouth Rinse BID  . mouth rinse  15 mL Mouth Rinse QID  . tamsulosin  0.4 mg Oral Daily  . ticagrelor  90 mg Oral BID   Continuous Infusions: . cefTRIAXone (ROCEPHIN)  IV Stopped (12/10/16 2249)  . dextrose 5% lactated ringers 75 mL/hr at 12/11/16 0805  . norepinephrine (LEVOPHED) Adult infusion 16 mcg/min (12/11/16 1119)  . vasopressin (PITRESSIN) infusion - *FOR SHOCK* 0.03 Units/min (12/11/16 0424)   PRN Meds: acetaminophen (TYLENOL) oral liquid 160 mg/5 mL, fentaNYL (SUBLIMAZE) injection, ondansetron   Vital Signs    Vitals:   12/11/16 0749 12/11/16 0800 12/11/16 1000 12/11/16 1100  BP:  (!) 113/59 116/68 115/78  Pulse:  (!) 107 (!) 102 91  Resp:  (!) 28 (!) 27 (!) 29  Temp:      TempSrc:      SpO2: 97% 96% 96% 97%  Weight:      Height:        Intake/Output Summary (Last 24 hours) at 12/11/16 1134 Last data filed at 12/11/16 1100  Gross per 24 hour  Intake          2734.79 ml  Output             1875 ml  Net           859.79 ml   Filed Weights   12/07/16 0449 12/09/16 0500 12/10/16 0300  Weight: 193 lb 8 oz (87.8 kg) 193 lb 9 oz (87.8 kg) 215 lb 6.2 oz (97.7 kg)    Telemetry    ST with occasional PVC and PAC - Personally Reviewed  ECG    12/10/2016 reveals NSR without new or ischemic changes.  - Personally Reviewed  Physical Exam  Elderly and frail GEN: No acute distress.   Neck: No JVD Cardiac: tachy and RR, no murmurs, rubs, or gallops.  Respiratory: Clear to auscultation bilaterally. GI: Soft, nontender, non-distended  MS: No edema; No deformity. Neuro:  Nonfocal    Psych: Normal affect   Labs    Chemistry Recent Labs Lab 12/08/16 2123 12/09/16 0558 12/10/16 0256  NA 138 137 136  K 3.8 3.7 3.9  CL 108 104 105  CO2 17* 18* 19*  GLUCOSE 65 108* 145*  BUN 41* 47* 53*  CREATININE 1.57* 2.19* 1.98*  CALCIUM 7.6* 7.6* 7.3*  ALBUMIN 2.1*  --   --   GFRNONAA 39* 26* 30*  GFRAA 45* 30* 34*  ANIONGAP _0 Hematology Recent Labs Lab 12/08/16 2122  12/09/16 0558 12/09/16 1139 12/10/16 0256  WBC 1.3*  --  9.5  --  21.6*  RBC 2.29*  --  2.87*  --  2.87*  HGB 7.1*  < > 8.7* 8.9* 8.6*  HCT 21.5*  < > 25.7* 27.3* 25.7*  MCV 93.9  --  89.5  --  89.5  MCH 31.0  --  30.3  --  30.0  MCHC 33.0  --  33.9  --  33.5  RDW 15.7*  --  17.5*  --  17.4*  PLT PLATELET CLUMPS NOTED ON SMEAR,  UNABLE TO ESTIMATE  --  104*  --  124*  < > = values in this interval not displayed.  Cardiac Enzymes Recent Labs Lab 12/08/16 2022 12/09/16 0558 12/09/16 1139 12/10/16 1018  TROPONINI 0.04* 0.06* 0.07* 0.07*   No results for input(s): TROPIPOC in the last 168 hours.   BNPNo results for input(s): BNP, PROBNP in the last 168 hours.   DDimer No results for input(s): DDIMER in the last 168 hours.   Radiology    Dg Chest Port 1 View  Result Date: 12/10/2016 CLINICAL DATA:  Respiratory failure, asbestosis, coronary artery disease. EXAM: PORTABLE CHEST 1 VIEW COMPARISON:  Portable chest x-ray of December 08, 2016 FINDINGS: The lungs are adequately inflated. The interstitial markings remain coarse. Calcified pleural plaques are present bilaterally. There is no alveolar infiltrate. There is no significant pleural effusion and no pneumothorax. The heart is mildly enlarged. The pulmonary vascularity is not engorged. The endotracheal tube tip projects 5.3 above the carina. The esophagogastric tube tip and proximal port project below the inferior margin of the image. The right internal jugular venous catheter tip projects over the midportion of the SVC. There is  calcification in the wall of the thoracic aorta. IMPRESSION: Interval advancement of the esophagogastric tube since at both the tip and proximal port project below the expected location of the GE junction. Stable pleuro parenchymal changes consistent with asbestosis. No acute pneumonia nor pulmonary edema. Mild cardiomegaly without pulmonary vascular congestion. Thoracic aortic atherosclerosis. Electronically Signed   By: David  Martinique M.D.   On: 12/10/2016 07:11    Cardiac Studies   EF 45% by echo 05/14/16  Patient Profile     83 y.o. male with a hx of CAD s/p DES to circumflex marginal, R knee arthroplasty, asbestosis exposure,HTN, HLD, aortic insufficiency, PAF , chronic combined CHF LVEF 35-40%, and CKD stage II who is being seen for pre op clearance at the request of Dr. Cathlean Sauer. It was recommended that surgery he delayed to allow 5 days washed out of Brilinta to avoid bleeding. Postop developed progressive hypotension, anemia, and reintubation after hip replacement on 12/08/2016.  Assessment & Plan    1. Elevated troponin I due to demand ischemia is setting of anemia and hypotension. No ischemic w/u warranted. 2. CAD with prior stent in LAD.  Please call if we can help further.  Signed, Sinclair Grooms, MD  12/11/2016, 11:34 AM

## 2016-12-11 NOTE — Progress Notes (Signed)
No distress noted. Bipap on standby at this time. Patient weaned to 4 L via nasal cannula. Will continue to monitor.

## 2016-12-12 ENCOUNTER — Inpatient Hospital Stay (HOSPITAL_COMMUNITY): Payer: PPO

## 2016-12-12 DIAGNOSIS — R6521 Severe sepsis with septic shock: Secondary | ICD-10-CM

## 2016-12-12 DIAGNOSIS — A419 Sepsis, unspecified organism: Secondary | ICD-10-CM

## 2016-12-12 LAB — BASIC METABOLIC PANEL
ANION GAP: 9 (ref 5–15)
Anion gap: 10 (ref 5–15)
BUN: 66 mg/dL — ABNORMAL HIGH (ref 6–20)
BUN: 71 mg/dL — ABNORMAL HIGH (ref 6–20)
CALCIUM: 8 mg/dL — AB (ref 8.9–10.3)
CHLORIDE: 109 mmol/L (ref 101–111)
CHLORIDE: 107 mmol/L (ref 101–111)
CO2: 24 mmol/L (ref 22–32)
CO2: 25 mmol/L (ref 22–32)
CREATININE: 1.22 mg/dL (ref 0.61–1.24)
Calcium: 7.9 mg/dL — ABNORMAL LOW (ref 8.9–10.3)
Creatinine, Ser: 1.2 mg/dL (ref 0.61–1.24)
GFR calc Af Amer: >60 mL/min (ref >60)
GFR calc non Af Amer: 54 mL/min — ABNORMAL LOW (ref >60)
GFR, EST NON AFRICAN AMERICAN: 53 mL/min — AB (ref >60)
GLUCOSE: 144 mg/dL — AB (ref 65–99)
Glucose, Bld: 171 mg/dL — ABNORMAL HIGH (ref 65–99)
POTASSIUM: 3.4 mmol/L — AB (ref 3.5–5.1)
POTASSIUM: 3.5 mmol/L (ref 3.5–5.1)
SODIUM: 142 mmol/L (ref 135–145)
Sodium: 142 mmol/L (ref 135–145)

## 2016-12-12 LAB — HEPATIC FUNCTION PANEL
ALBUMIN: 2.2 g/dL — AB (ref 3.5–5.0)
ALK PHOS: 77 U/L (ref 38–126)
ALT: 51 U/L (ref 17–63)
AST: 102 U/L — ABNORMAL HIGH (ref 15–41)
BILIRUBIN INDIRECT: 4.5 mg/dL — AB (ref 0.3–0.9)
Bilirubin, Direct: 7 mg/dL — ABNORMAL HIGH (ref 0.1–0.5)
TOTAL PROTEIN: 5 g/dL — AB (ref 6.5–8.1)
Total Bilirubin: 11.5 mg/dL — ABNORMAL HIGH (ref 0.3–1.2)

## 2016-12-12 LAB — POCT I-STAT 3, ART BLOOD GAS (G3+)
Acid-base deficit: 2 mmol/L (ref 0.0–2.0)
Bicarbonate: 23 mmol/L (ref 20.0–28.0)
O2 Saturation: 95 %
PCO2 ART: 37.1 mmHg (ref 32.0–48.0)
PH ART: 7.402 (ref 7.350–7.450)
PO2 ART: 74 mmHg — AB (ref 83.0–108.0)
Patient temperature: 99
TCO2: 24 mmol/L (ref 22–32)

## 2016-12-12 LAB — CBC
HCT: 25.7 % — ABNORMAL LOW (ref 39.0–52.0)
HEMOGLOBIN: 8.2 g/dL — AB (ref 13.0–17.0)
MCH: 28.8 pg (ref 26.0–34.0)
MCHC: 31.9 g/dL (ref 30.0–36.0)
MCV: 90.2 fL (ref 78.0–100.0)
PLATELETS: 88 10*3/uL — AB (ref 150–400)
RBC: 2.85 MIL/uL — AB (ref 4.22–5.81)
RDW: 17.2 % — ABNORMAL HIGH (ref 11.5–15.5)
WBC: 15.9 10*3/uL — ABNORMAL HIGH (ref 4.0–10.5)

## 2016-12-12 LAB — GLUCOSE, CAPILLARY: GLUCOSE-CAPILLARY: 184 mg/dL — AB (ref 65–99)

## 2016-12-12 LAB — PHOSPHORUS
PHOSPHORUS: 2.1 mg/dL — AB (ref 2.5–4.6)
PHOSPHORUS: 2.5 mg/dL (ref 2.5–4.6)

## 2016-12-12 LAB — AMMONIA: Ammonia: 41 umol/L — ABNORMAL HIGH (ref 9–35)

## 2016-12-12 LAB — MAGNESIUM
MAGNESIUM: 2.3 mg/dL (ref 1.7–2.4)
MAGNESIUM: 2.3 mg/dL (ref 1.7–2.4)

## 2016-12-12 MED ORDER — CHLORHEXIDINE GLUCONATE 0.12 % MT SOLN
15.0000 mL | Freq: Two times a day (BID) | OROMUCOSAL | Status: DC
  Administered 2016-12-13 – 2017-01-15 (×63): 15 mL via OROMUCOSAL
  Filled 2016-12-12 (×44): qty 15

## 2016-12-12 MED ORDER — SODIUM CHLORIDE 0.9 % IV SOLN: INTRAVENOUS | Status: DC | PRN

## 2016-12-12 MED ORDER — PRO-STAT SUGAR FREE PO LIQD
30.0000 mL | Freq: Three times a day (TID) | ORAL | Status: DC
  Filled 2016-12-12: qty 30

## 2016-12-12 MED ORDER — VITAL AF 1.2 CAL PO LIQD
1000.0000 mL | ORAL | Status: DC
  Administered 2016-12-12 – 2016-12-13 (×3): 1000 mL
  Filled 2016-12-12: qty 1000

## 2016-12-12 MED ORDER — JEVITY 1.2 CAL PO LIQD
1000.0000 mL | ORAL | Status: DC
  Filled 2016-12-12 (×2): qty 1000

## 2016-12-12 MED ORDER — ACETAMINOPHEN 160 MG/5ML PO SOLN
650.0000 mg | Freq: Four times a day (QID) | ORAL | Status: DC | PRN
  Administered 2016-12-19: 650 mg
  Filled 2016-12-12 (×2): qty 20.3

## 2016-12-12 MED ORDER — SODIUM CHLORIDE 0.9 % IV SOLN
1.0000 g | Freq: Four times a day (QID) | INTRAVENOUS | Status: DC
  Administered 2016-12-12 – 2016-12-14 (×8): 1 g via INTRAVENOUS
  Filled 2016-12-12 (×10): qty 1000

## 2016-12-12 MED ORDER — INSULIN ASPART 100 UNIT/ML ~~LOC~~ SOLN
0.0000 [IU] | SUBCUTANEOUS | Status: DC
  Administered 2016-12-13: 2 [IU] via SUBCUTANEOUS
  Administered 2016-12-13: 1 [IU] via SUBCUTANEOUS
  Administered 2016-12-13 (×2): 2 [IU] via SUBCUTANEOUS
  Administered 2016-12-13 – 2016-12-14 (×5): 1 [IU] via SUBCUTANEOUS
  Administered 2016-12-16: 2 [IU] via SUBCUTANEOUS
  Administered 2016-12-16: 1 [IU] via SUBCUTANEOUS
  Administered 2016-12-16: 2 [IU] via SUBCUTANEOUS
  Administered 2016-12-17: 1 [IU] via SUBCUTANEOUS
  Administered 2016-12-17: 2 [IU] via SUBCUTANEOUS
  Administered 2016-12-17 (×3): 1 [IU] via SUBCUTANEOUS
  Administered 2016-12-18 (×3): 2 [IU] via SUBCUTANEOUS
  Administered 2016-12-18 (×2): 1 [IU] via SUBCUTANEOUS
  Administered 2016-12-18 – 2016-12-19 (×6): 2 [IU] via SUBCUTANEOUS
  Administered 2016-12-20 – 2016-12-21 (×9): 1 [IU] via SUBCUTANEOUS
  Administered 2016-12-21 – 2016-12-22 (×4): 2 [IU] via SUBCUTANEOUS
  Administered 2016-12-22 (×2): 3 [IU] via SUBCUTANEOUS
  Administered 2016-12-22 (×3): 2 [IU] via SUBCUTANEOUS
  Administered 2016-12-23: 3 [IU] via SUBCUTANEOUS

## 2016-12-12 MED ORDER — VITAL HIGH PROTEIN PO LIQD: 1000.0000 mL | ORAL | Status: DC

## 2016-12-12 MED ORDER — LACTULOSE 10 GM/15ML PO SOLN
30.0000 g | Freq: Two times a day (BID) | ORAL | Status: DC
  Administered 2016-12-12 – 2016-12-15 (×6): 30 g
  Filled 2016-12-12 (×8): qty 45

## 2016-12-12 MED ORDER — PRO-STAT SUGAR FREE PO LIQD
30.0000 mL | Freq: Two times a day (BID) | ORAL | Status: DC
  Filled 2016-12-12: qty 30

## 2016-12-12 MED ORDER — ORAL CARE MOUTH RINSE
15.0000 mL | Freq: Two times a day (BID) | OROMUCOSAL | Status: DC
  Administered 2016-12-12 – 2017-01-15 (×61): 15 mL via OROMUCOSAL

## 2016-12-12 MED ORDER — TICAGRELOR 90 MG PO TABS
90.0000 mg | ORAL_TABLET | Freq: Two times a day (BID) | ORAL | Status: DC
  Administered 2016-12-12 – 2016-12-14 (×4): 90 mg
  Filled 2016-12-12 (×4): qty 1

## 2016-12-12 NOTE — Progress Notes (Signed)
Patient placed on BiPAP following placement of arterial line. Patient's SPO2 decreased to 84%. Patient is tolerating well at this time. RT will continue to monitor.

## 2016-12-12 NOTE — Progress Notes (Signed)
Cortrak Tube Team Note:  Consult received to place a Cortrak feeding tube.   A 10 F Cortrak tube was placed in the L nare and secured with a nasal bridle at 87 cm. Per the Cortrak monitor reading the tube tip is postpyloric, Approximately d2.   No x-ray is required. RN may begin using tube.   If the tube becomes dislodged please keep the tube and contact the Cortrak team at [email or web address] ([email or web address]) for replacement.  If after hours and replacement cannot be delayed, place a NG tube and confirm placement with an abdominal x-ray.   Christophe Louis RD, LDN, CNSC Clinical Nutrition Pager: [phone] 12/12/2016 3:38 PM

## 2016-12-12 NOTE — Progress Notes (Signed)
PULMONARY / CRITICAL CARE MEDICINE   Name: Danny Oneill MRN: [id number] DOB: 1931-12-07    ADMISSION DATE:  12/03/2016 CONSULTATION DATE:  12/08/2016  REFERRING MD:  OR   CHIEF COMPLAINT:  Hypotension  HISTORY OF PRESENT ILLNESS:   83 yo male admitted with Lt hip comminuted fracture after fall at home.  Developed agitated delirium while in hospital.  Had Lt intertrochanteric nailing on 9/04 with EBL 200 ml.  Noted to have urine retention.  Post op had hypotension, progressive anemia, and required reintubation.  Found to have E coli UTI.   PMHx of CAD, systolic CHF with EF 45 to 50%, HLD, PAF, CKD, Asbestosis exposure, BPH.  SUBJECTIVE:  Needed bipap overnight.  Remains on pressors.  VITAL SIGNS: BP 93/71   Pulse 87   Temp 97.8 F (36.6 C) (Axillary)   Resp (!) 27   Ht 6\' 1"  (1.854 m)   Wt 215 lb 6.2 oz (97.7 kg)   SpO2 98%   BMI 28.42 kg/m   VENTILATOR SETTINGS: FiO2 (%):  [4 %-40 %] 40 %  INTAKE / OUTPUT: I/O last 3 completed shifts: In: 3458.2 [I.V.:3358.2; IV Piggyback:100] Out: 2310 [Urine:2310]  PHYSICAL EXAMINATION:  General - somnolent Eyes - pupils reactive, jaundice ENT - dry mucosa Cardiac - regular, no murmur Chest - no wheeze, rales Abd - soft, non tender Ext - no edema Skin - no rashes Neuro - mumbles intermittently  LABS:  BMET  Recent Labs Lab 12/09/16 0558 12/10/16 0256 12/12/16 0315  NA 137 136 142  K 3.7 3.9 3.4*  CL 104 105 109  CO2 18* 19* 24  BUN 47* 53* 66*  CREATININE 2.19* 1.98* 1.22  GLUCOSE 108* 145* 144*    Electrolytes  Recent Labs Lab 12/09/16 0558  12/09/16 1623 12/10/16 0256 12/11/16 0445 12/12/16 0315  CALCIUM 7.6*  --   --  7.3*  --  8.0*  MG  --   < > 2.7* 2.6* 2.4  --   PHOS  --   < > 6.6* 5.1* 2.6  --   < > = values in this interval not displayed.  CBC  Recent Labs Lab 12/09/16 0558 12/09/16 1139 12/10/16 0256 12/12/16 0315  WBC 9.5  --  21.6* 15.9*  HGB 8.7* 8.9* 8.6* 8.2*  HCT 25.7* 27.3*  25.7* 25.7*  PLT 104*  --  124* 88*    Coag's  Recent Labs Lab 12/08/16 2301  INR 2.34    Sepsis Markers  Recent Labs Lab 12/08/16 2120 12/09/16 0345  LATICACIDVEN 6.7* 6.5*    ABG  Recent Labs Lab 12/09/16 0546 12/10/16 0302 12/12/16 0320  PHART 7.357 7.411 7.402  PCO2ART 33.5 35.3 37.1  PO2ART 116.0* 125.0* 74.0*    Liver Enzymes  Recent Labs Lab 12/08/16 2123 12/12/16 0315  AST  --  102*  ALT  --  51  ALKPHOS  --  77  BILITOT  --  11.5*  ALBUMIN 2.1* 2.2*    Cardiac Enzymes  Recent Labs Lab 12/09/16 0558 12/09/16 1139 12/10/16 1018  TROPONINI 0.06* 0.07* 0.07*    Glucose  Recent Labs Lab 12/09/16 1556 12/09/16 1933 12/09/16 2306 12/10/16 0317 12/10/16 0724 12/11/16 1223  GLUCAP 114* 128* 150* 139* 139* 129*    Imaging No results found.  STUDIES:   CULTURES: 9/4 BC >> 9/4 UC >> E coli  ANTIBIOTICS: 9/4 ceftriaxone >> 9/8 9/8 ampicillin  SIGNIFICANT EVENTS: 8/30 Admit 9/4  Surgery/ tx to ICU for hypotension  LINES/TUBES: 9/4 Left  Radial Aline >> 9/4 R TL IJ CVC >>  9/4 ETT >> 9/6  DISCUSSION: 83 yo with fall and hip fx.  Developed shock and VDRF after surgery from E coli UTI with sepsis and blood loss after surgery.  ASSESSMENT / PLAN:  Septic shock from E coli UTI. - change to ampicillin 9/08 - wean pressors to keep MAP > 65  Elevated LFTs. - f/u LFTs - check abd u/s, ammonia level - d/c lipitor, change Abx  Acute hypoxic respiratory failure. Hx of asbestos related pleural plaques. - oxygen to keep SpO2 > 90% - prn Bipap  Hx of CAD s/p DES 02/2016, chronic combined CHF, HLD, PAF. - ASA, brilinta  Acute renal failure. Hx of BPH with urine retention. - much improved - monitor renal fx  Dysphagia. Moderate protein calorie malnutrition. - place cortrak and start tube feeds  ABLA. Thrombocytopenia. - f/u CBC  Acute metabolic encephalopathy. - monitor neuro status  Lt intertrochantric  intramedullary nailing 12/08/16. - Post op care per ortho  DVT prophylaxis - SCD SUP - Not indicated Nutrition - tube feeds Goals of care - full code  Coralyn Helling, MD [facility] Pulmonary/Critical Care 12/12/2016, 10:02 AM Pager:  [phone] After 3pm call: [phone]

## 2016-12-12 NOTE — Progress Notes (Signed)
Nutrition Follow-up  DOCUMENTATION CODES:   Non-severe (moderate) malnutrition in context of chronic illness  INTERVENTION:    Tube Feeding:   Once Cortrak placed, start Vital AF 1.2 @ goal of 70 providing 2016 kcals, 126 g of protein and 1360 mL of free water. Meets 100% estimated calorie and protein needs. PEPuP ordered  Recommend supplementing potassium and phosphorus if not already completed  NUTRITION DIAGNOSIS:   Malnutrition (moderate) related to chronic illness (CAD, CKD) as evidenced by mild depletion of body fat, mild depletion of muscle mass, percent weight loss (9% weight loss in 3 months).  Being addressed via nutrition support   GOAL:   Patient will meet greater than or equal to 90% of their needs  Progressing  MONITOR:   Vent status, Labs, TF tolerance, Skin, I & O's  REASON FOR ASSESSMENT:   Consult Enteral/tube feeding initiation and management  ASSESSMENT:   83 yo male with PMH of HLD, lumbar spondylosis, MI, CAD, PAF, cardiomyopathy, CKD, CHF who was admitted on 8/30 with L hip fracture. He developed hypoxia and hypotension after surgery (L IM nail) on 9/4 and required re-intubation and transfer to the ICU.  9/6 Extubated 9/8 Plan for Cortrak placement with initiation of TF, pt with dysphagia 9/8 Bipap over night, off present, current on levophed and vasopressin  Labs: potassium 3.4, Creatinine wdl, phosphorus 2.1 Meds: H6-GO at 50 ml/hr, vasopressin, levophed  Diet Order:   NPO  Skin:   (L thigh incision)  Last BM:  9/4  Height:   Ht Readings from Last 1 Encounters:  12/08/16 6\' 1"  (1.854 m)    Weight:   Wt Readings from Last 1 Encounters:  12/10/16 215 lb 6.2 oz (97.7 kg)    Ideal Body Weight:  83.6 kg  BMI:  Body mass index is 28.42 kg/m.  Estimated Nutritional Needs:   Kcal:  1900-2250 kcals  Protein:  115-136 g   Fluid:  2 L  EDUCATION NEEDS:   No education needs identified at this time  Romelle Starcher MS, RD,  LDN [phone] Pager  [phone] Weekend/On-Call Pager

## 2016-12-12 NOTE — Progress Notes (Signed)
eLink Physician-Brief Progress Note Patient Name: Danny Oneill DOB: 1931/12/30 MRN: [id number]   Date of Service  12/12/2016  HPI/Events of Note  Hyperglycemia noted by bedside RN with no insulin coverage. Currently on tube feeding.  eICU Interventions  1. Checking Hgb A1C 2. Starting Accu-Checks q4hr 3. Starting SSI per Sensitive Algorithm      Intervention Category Major Interventions: Hyperglycemia - active titration of insulin therapy  Lawanda Cousins 12/12/2016, 11:47 PM

## 2016-12-12 NOTE — Progress Notes (Signed)
Patient is tolerating nasal cannula without distress at this time. BiPAP at bedside on stand-by if needed. RT will continue to monitor.

## 2016-12-12 NOTE — Procedures (Signed)
Arterial Catheter Insertion Procedure Note Danny Oneill [id number] Aug 14, 1931  Procedure: Insertion of Arterial Catheter  Indications: Blood pressure monitoring  Procedure Details Consent: Unable to obtain consent because of altered level of consciousness. Time Out: Verified patient identification, verified procedure, site/side was marked, verified correct patient position, special equipment/implants available, medications/allergies/relevent history reviewed, required imaging and test results available.  Performed  Maximum sterile technique was used including antiseptics, cap, gloves, gown, hand hygiene, mask and sheet. Skin prep: Chlorhexidine; local anesthetic administered 20 gauge catheter was inserted into left radial artery using the Seldinger technique.  Evaluation Blood flow good; BP tracing good. Complications: No apparent complications.   Azucena Freed 12/12/2016   Patient's arterial line placed by OR was dislodged, this RT placed a new one with order from Dr. Katrinka Blazing.

## 2016-12-12 NOTE — Progress Notes (Signed)
eLink Physician-Brief Progress Note Patient Name: Dereon Ryel DOB: [birth year] MRN: [id number]   Date of Service  12/12/2016  HPI/Events of Note  Ammonia level = 41.   eICU Interventions  Will order: 1. Lactulose 30 gm per tube BID. 2. Ammonia level in AM.      Intervention Category Intermediate Interventions: Diagnostic test evaluation  Sommer,Steven Eugene 12/12/2016, 6:09 PM

## 2016-12-12 NOTE — Progress Notes (Signed)
eLink Physician-Brief Progress Note Patient Name: Danny Oneill DOB: Aug 05, 1931 MRN: [id number]   Date of Service  12/12/2016  HPI/Events of Note  Frequent PVC's and PAC's.   eICU Interventions  Will order: 1. BMP and Mg++ level STAT.     Intervention Category Major Interventions: Arrhythmia - evaluation and management  Corin Formisano Eugene 12/12/2016, 4:50 PM

## 2016-12-12 NOTE — Progress Notes (Signed)
Pharmacy Antibiotic Note  Lex Traudt is a 83 y.o. male admitted on 12/03/2016 with UTI.  Pharmacy has been consulted for ampicillin dosing. Pt is afebrile and WBC is elevated at 15.9. SCr has trended down to 1.22. Previously on ceftriaxone.   Plan: Ampicillin 1g IV Q6H F/u renal fxn, C&S, clinical status and LOT  Height: 6\' 1"  (185.4 cm) Weight: 215 lb 6.2 oz (97.7 kg) IBW/kg (Calculated) : 79.9  Temp (24hrs), Avg:98.6 F (37 C), Min:97.8 F (36.6 C), Max:99 F (37.2 C)   Recent Labs Lab 12/08/16 0523 12/08/16 2120 12/08/16 2122 12/08/16 2123 12/09/16 0345 12/09/16 0558 12/10/16 0256 12/12/16 0315  WBC  --   --  1.3*  --   --  9.5 21.6* 15.9*  CREATININE 1.02  --   --  1.57*  --  2.19* 1.98* 1.22  LATICACIDVEN  --  6.7*  --   --  6.5*  --   --   --     Estimated Creatinine Clearance: 56.5 mL/min (by C-G formula based on SCr of 1.22 mg/dL).    Allergies  Allergen Reactions  . Amiodarone Rash  . Morphine And Related Other (See Comments)    Confusion/ hallucinations     Antimicrobials this admission: Ceftriaxone 9/4>>9/8 Ampicillin 9/8>>  Dose adjustments this admission: N/A  Microbiology results: 9/4 Urine - Ecoli 9/4 Blood - NGTD 8/30 MRSA - NEG   Thank you for allowing pharmacy to be a part of this patient's care.  Konner Warrior, Drake Leach 12/12/2016 10:36 AM

## 2016-12-12 NOTE — Progress Notes (Signed)
Subjective: 4 Days Post-Op Procedure(s) (LRB): INTRAMEDULLARY (IM) NAIL INTERTROCHANTRIC (Left)  Patient resting in bed.  Daughter is present.  She reports that he has been difficult to arouse and has a difficult time hearing at baseline.  Objective:   VITALS:  Temp:  [97.8 F (36.6 C)-99 F (37.2 C)] 97.8 F (36.6 C) (09/08 0732) Pulse Rate:  [33-120] 87 (09/08 0732) Resp:  [18-34] 27 (09/08 0732) BP: (63-140)/(33-99) 93/71 (09/08 0732) SpO2:  [85 %-100 %] 98 % (09/08 0732) Arterial Line BP: (53-158)/(38-69) 82/38 (09/08 0830) FiO2 (%):  [4 %-40 %] 40 % (09/08 0400)  General: Elderly male patient in NAD. Psych:  Not able to assess. Neuro:  Unable to assess orientation, sensation to light touch results in response HEENT: unable to assess EOMs Chest:  Even non-labored respirations Skin:  Dressing C/D/I, no rashes or lesions Extremities: warm/dry, mild edema, no erythema or echymosis.  No lymphadenopathy. Pulses: Popliteus 1+ MSK:  Unable to assess appropriately.  Patient is moving his L ankle while sleeping    LABS  Recent Labs  12/09/16 1139 12/10/16 0256 12/12/16 0315  HGB 8.9* 8.6* 8.2*  WBC  --  21.6* 15.9*  PLT  --  124* 88*    Recent Labs  12/10/16 0256 12/12/16 0315  NA 136 142  K 3.9 3.4*  CL 105 109  CO2 19* 24  BUN 53* 66*  CREATININE 1.98* 1.22  GLUCOSE 145* 144*   No results for input(s): LABPT, INR in the last 72 hours.   Assessment/Plan: 4 Days Post-Op Procedure(s) (LRB): INTRAMEDULLARY (IM) NAIL INTERTROCHANTRIC (Left)  Patient seen in rounds for Dr. Aundria Rud 50% WB L LE once stable enough to get out of bed.  Alfredo Martinez, PA-C [facility] Office:  [phone]

## 2016-12-13 LAB — CULTURE, BLOOD (ROUTINE X 2)
CULTURE: NO GROWTH
CULTURE: NO GROWTH
Special Requests: ADEQUATE
Special Requests: ADEQUATE

## 2016-12-13 LAB — COMPREHENSIVE METABOLIC PANEL
ALK PHOS: 107 U/L (ref 38–126)
ALT: 50 U/L (ref 17–63)
AST: 88 U/L — ABNORMAL HIGH (ref 15–41)
Albumin: 1.9 g/dL — ABNORMAL LOW (ref 3.5–5.0)
Anion gap: 8 (ref 5–15)
BILIRUBIN TOTAL: 14.6 mg/dL — AB (ref 0.3–1.2)
BUN: 74 mg/dL — ABNORMAL HIGH (ref 6–20)
CALCIUM: 7.8 mg/dL — AB (ref 8.9–10.3)
CO2: 26 mmol/L (ref 22–32)
CREATININE: 1.04 mg/dL (ref 0.61–1.24)
Chloride: 111 mmol/L (ref 101–111)
Glucose, Bld: 188 mg/dL — ABNORMAL HIGH (ref 65–99)
Potassium: 3.3 mmol/L — ABNORMAL LOW (ref 3.5–5.1)
Sodium: 145 mmol/L (ref 135–145)
TOTAL PROTEIN: 5.2 g/dL — AB (ref 6.5–8.1)

## 2016-12-13 LAB — GLUCOSE, CAPILLARY
GLUCOSE-CAPILLARY: 129 mg/dL — AB (ref 65–99)
GLUCOSE-CAPILLARY: 138 mg/dL — AB (ref 65–99)
GLUCOSE-CAPILLARY: 184 mg/dL — AB (ref 65–99)
GLUCOSE-CAPILLARY: 197 mg/dL — AB (ref 65–99)
Glucose-Capillary: 173 mg/dL — ABNORMAL HIGH (ref 65–99)
Glucose-Capillary: 113 mg/dL — ABNORMAL HIGH (ref 65–99)
Glucose-Capillary: 122 mg/dL — ABNORMAL HIGH (ref 65–99)

## 2016-12-13 LAB — MAGNESIUM
Magnesium: 2.2 mg/dL (ref 1.7–2.4)
Magnesium: 2.2 mg/dL (ref 1.7–2.4)

## 2016-12-13 LAB — PHOSPHORUS
PHOSPHORUS: 2.6 mg/dL (ref 2.5–4.6)
Phosphorus: 1.6 mg/dL — ABNORMAL LOW (ref 2.5–4.6)

## 2016-12-13 LAB — CBC
HCT: 24.8 % — ABNORMAL LOW (ref 39.0–52.0)
Hemoglobin: 8 g/dL — ABNORMAL LOW (ref 13.0–17.0)
MCH: 29.4 pg (ref 26.0–34.0)
MCHC: 32.3 g/dL (ref 30.0–36.0)
MCV: 91.2 fL (ref 78.0–100.0)
Platelets: 99 10*3/uL — ABNORMAL LOW (ref 150–400)
RBC: 2.72 MIL/uL — AB (ref 4.22–5.81)
RDW: 17.3 % — ABNORMAL HIGH (ref 11.5–15.5)
WBC: 14.9 10*3/uL — ABNORMAL HIGH (ref 4.0–10.5)

## 2016-12-13 LAB — AMMONIA: Ammonia: 57 umol/L — ABNORMAL HIGH (ref 9–35)

## 2016-12-13 MED ORDER — POTASSIUM PHOSPHATES 15 MMOLE/5ML IV SOLN
20.0000 mmol | Freq: Once | INTRAVENOUS | Status: AC
  Administered 2016-12-13: 20 mmol via INTRAVENOUS
  Filled 2016-12-13: qty 6.67

## 2016-12-13 NOTE — Progress Notes (Signed)
eLink Physician-Brief Progress Note Patient Name: Danny Oneill DOB: 1931/09/22 MRN: [id number]   Date of Service  12/13/2016  HPI/Events of Note  Diarrhea - Request for Flexiseal.   eICU Interventions  Will order Flexiseal.      Intervention Category Major Interventions: Other:  Sommer,Steven Dennard Nip 12/13/2016, 5:14 PM

## 2016-12-13 NOTE — Progress Notes (Signed)
PULMONARY / CRITICAL CARE MEDICINE   Name: Danny Oneill MRN: [id number] DOB: 09-Nov-1931    ADMISSION DATE:  12/03/2016 CONSULTATION DATE:  12/08/2016  REFERRING MD:  OR   CHIEF COMPLAINT:  Hypotension  HISTORY OF PRESENT ILLNESS:   83 yo male admitted with Lt hip comminuted fracture after fall at home.  Developed agitated delirium while in hospital.  Had Lt intertrochanteric nailing on 9/04 with EBL 200 ml.  Noted to have urine retention.  Post op had hypotension, progressive anemia, and required reintubation.  Found to have E coli UTI.   PMHx of CAD, systolic CHF with EF 45 to 50%, HLD, PAF, CKD, Asbestosis exposure, BPH.  SUBJECTIVE:  Off Bipap.  Remains on pressors.  VITAL SIGNS: BP 116/67 (BP Location: Right Arm)   Pulse (!) 27   Temp 97.7 F (36.5 C) (Oral)   Resp (!) 23   Ht  (1.854 m)   Wt 217 lb 2.5 oz (98.5 kg)   SpO2 100%   BMI 28.65 kg/m   INTAKE / OUTPUT: I/O last 3 completed shifts: In: 4119.8 [I.V.:2808.1; NG/GT:1061.7; IV Piggyback:250] Out: 2145 [Urine:2145]  PHYSICAL EXAMINATION:  General - somnolent Eyes - pupils reactive, jaundice ENT - dry mucosa Cardiac - regular, no murmur Chest - no wheeze, rales Abd - soft, non tender Ext - no edema Skin - no rashes Neuro - mumbles few words, moves extremities  LABS:  BMET  Recent Labs Lab 12/12/16 0315 12/12/16 1732 12/13/16 0335  NA 142 142 145  K 3.4* 3.5 3.3*  CL 109 107 111  CO2 BUN 66* 71* 74*  CREATININE 1.22 1.20 1.04  GLUCOSE 144* 171* 188*    Electrolytes  Recent Labs Lab 12/12/16 0315 12/12/16 1009 12/12/16 1732 12/13/16 0335  CALCIUM 8.0*  --  7.9* 7.8*  MG  --  2.3 2.3 2.2  PHOS  --  2.1* 2.5 1.6*    CBC  Recent Labs Lab 12/10/16 0256 12/12/16 0315 12/13/16 0335  WBC 21.6* 15.9* 14.9*  HGB 8.6* 8.2* 8.0*  HCT 25.7* 25.7* 24.8*  PLT 124* 88* 99*    Coag's  Recent Labs Lab 12/08/16 2301  INR 2.34    Sepsis Markers  Recent Labs Lab  12/08/16 2120 12/09/16 0345  LATICACIDVEN 6.7* 6.5*    ABG  Recent Labs Lab 12/09/16 0546 12/10/16 0302 12/12/16 0320  PHART 7.357 7.411 7.402  PCO2ART 33.5 35.3 37.1  PO2ART 116.0* 125.0* 74.0*    Liver Enzymes  Recent Labs Lab 12/08/16 2123 12/12/16 0315 12/13/16 0335  AST  --  102* 88*  ALT  --  51 50  ALKPHOS  --  77 107  BILITOT  --  11.5* 14.6*  ALBUMIN 2.1* 2.2* 1.9*    Cardiac Enzymes  Recent Labs Lab 12/09/16 0558 12/09/16 1139 12/10/16 1018  TROPONINI 0.06* 0.07* 0.07*    Glucose  Recent Labs Lab 12/10/16 0724 12/11/16 1223 12/12/16 1941 12/12/16 2332 12/13/16 0339 12/13/16 0826  GLUCAP 139* 129* 184* 197* 184* 173*    Imaging No results found.  STUDIES:  Abd u/s 9/08 >>  CULTURES: 9/4 BC >> negative 9/4 UC >> E coli  ANTIBIOTICS: 9/4 ceftriaxone >> 9/8 9/8 ampicillin >>   SIGNIFICANT EVENTS: 8/30 Admit 9/04  Surgery/ tx to ICU for hypotension  LINES/TUBES: 9/4 Left Radial Aline >> 9/4 R TL IJ CVC >>  9/4 ETT >> 9/6  DISCUSSION: 83 yo with fall and hip fx.  Developed shock and VDRF  after surgery from E coli UTI with sepsis and blood loss after surgery.  ASSESSMENT / PLAN:  Septic shock from E coli UTI. - continue Abx - wean pressors to keep MAP > 65  Elevated LFTs. Elevated ammonia. - f/u LFTs - continue lactulose - hold lipitor - abd u/s pending >> done on 9/08, but technical difficulty with machine so results still pending  Acute hypoxic respiratory failure. Hx of asbestos related pleural plaques. - oxygen to keep SpO2 > 90% - Bipap prn  Hx of CAD s/p DES 02/2016, chronic combined CHF, HLD, PAF. - ASA, brilinta  Acute renal failure >> improved. Hx of BPH with urine retention. - monitor renal fx  Dysphagia. Moderate protein calorie malnutrition. - tube feeds  ABLA. Thrombocytopenia. - f/u CBC  Acute metabolic encephalopathy. - continue lactulose  Lt intertrochantric intramedullary nailing  12/08/16. - Post op care per ortho  DVT prophylaxis - SCD SUP - Not indicated Nutrition - tube feeds Goals of care - full code  Updated pt's family at bedside  CC time 31 minutes   Coralyn Helling, MD [facility] Pulmonary/Critical Care 12/13/2016, 11:26 AM Pager:  [phone] After 3pm call: [phone]

## 2016-12-13 NOTE — Progress Notes (Signed)
eLink Physician-Brief Progress Note Patient Name: Danny Oneill DOB: 09-15-1931 MRN: [id number]   Date of Service  12/13/2016  HPI/Events of Note  Hypokalemia 3.3 w/ phos 1.6. Normal magnesium. Renal function improving.  eICU Interventions  KPhos IV ordered      Intervention Category Major Interventions: Electrolyte abnormality - evaluation and management  Lawanda Cousins 12/13/2016, 6:07 AM

## 2016-12-13 NOTE — Progress Notes (Signed)
[facility] ADULT ICU REPLACEMENT PROTOCOL FOR AM LAB REPLACEMENT ONLY  The patient does not apply for the [facility] Adult ICU Electrolyte Replacment Protocol based on the criteria listed below:    3. Is BUN < 60 mg/dL? No.  Patient's BUN today is 74 4. Abnormal electrolyte(s): K3.3  6. If a panic level lab has been reported, has the CCM MD in charge been notified? Yes.  .   Physician:  Staci Acosta, MD  Melrose Nakayama 12/13/2016 5:59 AM

## 2016-12-13 NOTE — Progress Notes (Signed)
Patient is currently resting comfortably on 2LNC. All vitals are stable. BIPAP is on standby but is not needed at this time.

## 2016-12-14 LAB — COMPREHENSIVE METABOLIC PANEL
ALBUMIN: 1.9 g/dL — AB (ref 3.5–5.0)
ALT: 66 U/L — ABNORMAL HIGH (ref 17–63)
AST: 123 U/L — AB (ref 15–41)
Alkaline Phosphatase: 99 U/L (ref 38–126)
Anion gap: 7 (ref 5–15)
BUN: 73 mg/dL — AB (ref 6–20)
CHLORIDE: 115 mmol/L — AB (ref 101–111)
CO2: 26 mmol/L (ref 22–32)
Calcium: 7.9 mg/dL — ABNORMAL LOW (ref 8.9–10.3)
Creatinine, Ser: 0.88 mg/dL (ref 0.61–1.24)
GFR calc Af Amer: >60 mL/min (ref >60)
GFR calc non Af Amer: >60 mL/min (ref >60)
GLUCOSE: 160 mg/dL — AB (ref 65–99)
POTASSIUM: 3.3 mmol/L — AB (ref 3.5–5.1)
SODIUM: 148 mmol/L — AB (ref 135–145)
Total Bilirubin: 17.8 mg/dL — ABNORMAL HIGH (ref 0.3–1.2)
Total Protein: 5 g/dL — ABNORMAL LOW (ref 6.5–8.1)

## 2016-12-14 LAB — GLUCOSE, CAPILLARY
GLUCOSE-CAPILLARY: 124 mg/dL — AB (ref 65–99)
GLUCOSE-CAPILLARY: 105 mg/dL — AB (ref 65–99)
Glucose-Capillary: 141 mg/dL — ABNORMAL HIGH (ref 65–99)
Glucose-Capillary: 148 mg/dL — ABNORMAL HIGH (ref 65–99)
Glucose-Capillary: 107 mg/dL — ABNORMAL HIGH (ref 65–99)
Glucose-Capillary: 98 mg/dL (ref 65–99)

## 2016-12-14 LAB — CBC
HEMATOCRIT: 27.1 % — AB (ref 39.0–52.0)
Hemoglobin: 8.6 g/dL — ABNORMAL LOW (ref 13.0–17.0)
MCH: 29.3 pg (ref 26.0–34.0)
MCHC: 31.7 g/dL (ref 30.0–36.0)
MCV: 92.2 fL (ref 78.0–100.0)
Platelets: 118 10*3/uL — ABNORMAL LOW (ref 150–400)
RBC: 2.94 MIL/uL — ABNORMAL LOW (ref 4.22–5.81)
RDW: 17.4 % — AB (ref 11.5–15.5)
WBC: 16.7 10*3/uL — ABNORMAL HIGH (ref 4.0–10.5)

## 2016-12-14 LAB — HEMOGLOBIN A1C
HEMOGLOBIN A1C: 5.8 % — AB (ref 4.8–5.6)
MEAN PLASMA GLUCOSE: 120 mg/dL

## 2016-12-14 MED ORDER — POTASSIUM CHLORIDE 20 MEQ/15ML (10%) PO SOLN
40.0000 meq | Freq: Once | ORAL | Status: AC
  Administered 2016-12-14: 40 meq
  Filled 2016-12-14: qty 30

## 2016-12-14 MED ORDER — DEXTROSE 5 % IV SOLN
INTRAVENOUS | Status: DC
  Administered 2016-12-14 – 2016-12-20 (×8): via INTRAVENOUS

## 2016-12-14 MED ORDER — PIPERACILLIN-TAZOBACTAM 3.375 G IVPB
3.3750 g | Freq: Three times a day (TID) | INTRAVENOUS | Status: DC
  Administered 2016-12-14 – 2016-12-16 (×6): 3.375 g via INTRAVENOUS
  Filled 2016-12-14 (×8): qty 50

## 2016-12-14 MED ORDER — ASPIRIN 81 MG PO CHEW
81.0000 mg | CHEWABLE_TABLET | Freq: Every day | ORAL | Status: DC
  Administered 2016-12-16 – 2017-01-01 (×17): 81 mg
  Filled 2016-12-14 (×17): qty 1

## 2016-12-14 NOTE — Progress Notes (Signed)
Pharmacy Antibiotic Note  Sears Haag is a 83 y.o. male admitted on 12/03/2016 with e.coli UTI.  Team now suspects possible cholecystitis. Pharmacy has been consulted for Zosyn dosing.  Plan: -Zosyn 3.375 g q8h -Monitor for clinical improvement, follow-up labs.  Height: 6\' 1"  (185.4 cm) Weight: 217 lb 13 oz (98.8 kg) IBW/kg (Calculated) : 79.9  Temp (24hrs), Avg:98 F (36.7 C), Min:97.6 F (36.4 C), Max:98.5 F (36.9 C)   Recent Labs Lab 12/08/16 2120  12/09/16 0345 12/09/16 0558 12/10/16 0256 12/12/16 0315 12/12/16 1732 12/13/16 0335 12/14/16 0318  WBC  --   < >  --  9.5 21.6* 15.9*  --  14.9* 16.7*  CREATININE  --   < >  --  2.19* 1.98* 1.22 1.20 1.04 0.88  LATICACIDVEN 6.7*  --  6.5*  --   --   --   --   --   --   < > = values in this interval not displayed.  Estimated Creatinine Clearance: 78.7 mL/min (by C-G formula based on SCr of 0.88 mg/dL).    Allergies  Allergen Reactions  . Amiodarone Rash  . Morphine And Related Other (See Comments)    Confusion/ hallucinations     Antimicrobials this admission: Ceftriaxone 9/4>>9/8 Ampicillin 9/8>> 9/10 Zosyn 9/10>>  Dose adjustments this admission:  Microbiology results: 9/4 urine >> e.coli 9/4 blood x 2 > ngtd 8/30 MRSA pcr > neg  Thank you for allowing pharmacy to be a part of this patient's care.  Ladell Pier, PharmD Pharmacy Resident Pager: [phone] 12/14/2016 11:45 AM

## 2016-12-14 NOTE — Consult Note (Signed)
[facility] Consultation Note  Referring Provider: Dr. Craige Cotta (PCCM) Primary Care Physician:  Geoffry Paradise, MD  Reason for Consultation:  Elevated liver enzymes, abdominal pain  HPI: Danny Oneill is a 83 y.o. male admitted about 10 days ago for left hip fracture.  Had stormy post-operative course, including bacteremia, post-operative hypotension.  We are asked to see for abdominal pain and elevated LFTs.  Became jaundiced per wife (patient unable to answer questions) about 2-3 days ago; is having some upper abdominal tenderness; ultrasound showed gallbladder wall thickening, sludge but no gallstones in gallbladder, and no biliary ductal dilatation.   Past Medical History:  Diagnosis Date  . Anemia   . Aortic insufficiency 03/09/2016   a. mild-mod AI by echo 03/04/16.  . Arthritis   . Asbestosis (HCC)    "no breathing problems" pt states worked in [employer] for many yrs and was exposed to asbestosis  . Bladder outlet obstruction 03/09/2016  . CAD (coronary artery disease)    a. 02/2016: inf lat STEMI with occluded OM2 which was treated with DES; otherwise he had moderate 50% oRCA, 40% oLM, 70% D1, 10% mLAD, 80% lateral 2nd marg which was treated medically.  . Cardiomyopathy, ischemic   . Chronic combined systolic and diastolic CHF (congestive heart failure) (HCC)    a. EF 30-35% at time of STEMI 02/2016, improved to 40-45% by echo 11/20 (but with pericardial effusion). b. Further improved EF to 55-60% 03/04/16.  Marland Kitchen CKD (chronic kidney disease), stage III   . Coronary artery disease   . Dyspnea   . First degree AV block   . Hip fracture (HCC) 11/2016  . Hyperlipidemia   . Hyponatremia   . Lumbar spondylosis   . Myocardial infarction (HCC)   . Orthostatic hypotension   . PAF (paroxysmal atrial fibrillation) (HCC)    a. dx 02/2016: not anticoagulated due to pericardial effusion; amiodarone stopped due to suspected allergic drug rash.  . Pericardial effusion    a.  02/2016 - followed clinically (mod-large at first then small-mod in f/u echo).    Past Surgical History:  Procedure Laterality Date  . CARDIAC CATHETERIZATION N/A 02/02/2016   Procedure: Left Heart Cath and Coronary Angiography;  Surgeon: Corky Crafts, MD;  Location: [facility] INVASIVE CV LAB;  Service: Cardiovascular;  Laterality: N/A;  . CARDIAC CATHETERIZATION N/A 02/02/2016   Procedure: Coronary Stent Intervention;  Surgeon: Corky Crafts, MD;  Location: [facility] INVASIVE CV LAB;  Service: Cardiovascular;  Laterality: N/A;  . CARDIAC CATHETERIZATION N/A 02/02/2016   Procedure: IABP Insertion;  Surgeon: Corky Crafts, MD;  Location: [facility] INVASIVE CV LAB;  Service: Cardiovascular;  Laterality: N/A;  . CATARACTS REMOVED     BIL  . CORONARY STENT PLACEMENT  01/2016  . INTRAMEDULLARY (IM) NAIL INTERTROCHANTERIC Left 12/08/2016   Procedure: INTRAMEDULLARY (IM) NAIL INTERTROCHANTRIC;  Surgeon: Yolonda Kida, MD;  Location: [facility] OR;  Service: Orthopedics;  Laterality: Left;  . ROTATOR CUFF REPAIR  2011   L SHOULDER  . TOTAL KNEE ARTHROPLASTY Right 10/03/2013   Procedure: RIGHT TOTAL KNEE ARTHROPLASTY;  Surgeon: [name], MD;  Location: [facility] ORS;  Service: Orthopedics;  Laterality: Right;    Prior to Admission medications   Medication Sig Start Date End Date Taking? Authorizing Provider  aspirin EC 81 MG tablet Take 81 mg by mouth daily with breakfast.    Yes [provider]  atorvastatin (LIPITOR) 20 MG tablet Take 1 tablet (20 mg total) by mouth daily. 04/07/16  Yes [name]  A, MD  carvedilol (COREG) 3.125 MG tablet Take 3.125 mg by mouth 2 (two) times daily. 11/18/16  Yes [provider]  furosemide (LASIX) 20 MG tablet Take 1 tablet by mouth daily and ok to use extra tablet as needed for swelling. 10/14/16  Yes Cardama, Amadeo Garnet, MD  lisinopril (PRINIVIL,ZESTRIL) 2.5 MG tablet Take 1 tablet (2.5 mg total) by mouth daily. 09/21/16 12/20/16 Yes Lennette Bihari, MD  tamsulosin (FLOMAX) 0.4 MG CAPS capsule TAKE 1 CAPSULE(0.4 MG) BY MOUTH DAILY AFTER SUPPER 04/01/16  Yes Dunn, Tacey Ruiz, PA-C  ticagrelor (BRILINTA) 90 MG TABS tablet Take 1 tablet (90 mg total) by mouth 2 (two) times daily. 04/07/16  Yes Lennette Bihari, MD  carvedilol (COREG) 6.25 MG tablet Take 1 tablet (6.25 mg total) by mouth 2 (two) times daily. Patient not taking: Reported on 12/03/2016 04/28/16   Lennette Bihari, MD    Current Facility-Administered Medications  Medication Dose Route Frequency Provider Last Rate Last Dose  . 0.9 %  sodium chloride infusion   Intra-arterial PRN Henry Russel, MD      . acetaminophen (TYLENOL) solution 650 mg  650 mg Per Tube Q6H PRN Coralyn Helling, MD      . aspirin chewable tablet 81 mg  81 mg Per Tube Daily Simonne Martinet, NP      . bacitracin ointment 1 application  1 application Topical BID Little, Ambrose Finland, MD   1 application at 12/13/16 2207  . chlorhexidine (PERIDEX) 0.12 % solution 15 mL  15 mL Mouth Rinse BID Nelda Bucks, MD   15 mL at 12/14/16 1308  . dextrose 5 % solution   Intravenous Continuous Simonne Martinet, NP      . insulin aspart (novoLOG) injection 0-9 Units  0-9 Units Subcutaneous Q4H Roslynn Amble, MD   1 Units at 12/14/16 0900  . lactulose (CHRONULAC) 10 GM/15ML solution 30 g  30 g Per Tube BID Simonne Martinet, NP   30 g at 12/14/16 0909  . MEDLINE mouth rinse  15 mL Mouth Rinse q12n4p Nelda Bucks, MD   15 mL at 12/13/16 1700  . norepinephrine (LEVOPHED) 16 mg in D5W premix infusion  0-40 mcg/min Intravenous Titrated Coralyn Helling, MD 4.7 mL/hr at 12/14/16 1236 5 mcg/min at 12/14/16 1236  . ondansetron (ZOFRAN) injection 4 mg  4 mg Intravenous Q6H PRN Coralyn Helling, MD      . piperacillin-tazobactam (ZOSYN) IVPB 3.375 g  3.375 g Intravenous Q8H Simonne Martinet, NP      . potassium chloride 20 MEQ/15ML (10%) solution 40 mEq  40 mEq Per Tube Once Simonne Martinet, NP      . potassium chloride 20  MEQ/15ML (10%) solution 40 mEq  40 mEq Per Tube Once Simonne Martinet, NP      . tamsulosin (FLOMAX) capsule 0.4 mg  0.4 mg Oral Daily Coralyn Helling, MD   0.4 mg at 12/14/16 0910    Allergies as of 12/03/2016 - Review Complete 12/03/2016  Allergen Reaction Noted  . Amiodarone Rash 03/04/2016  . Morphine and related Other (See Comments) 02/04/2016    Family History  Problem Relation Age of Onset  . Cancer Mother     Social History   Social History  . Marital status: Married    Spouse name: N/A  . Number of children: N/A  . Years of education: N/A   Occupational History  . Not on file.   Social History  Main Topics  . Smoking status: Never Smoker  . Smokeless tobacco: Never Used  . Alcohol use No  . Drug use: No  . Sexual activity: Not on file   Other Topics Concern  . Not on file   Social History Narrative  . No narrative on file    Review of Systems: Unable to obtain due to patient's altered and somnolent state  Physical Exam: Vital signs in last 24 hours: Temp:  [97.6 F (36.4 C)-98.5 F (36.9 C)] 98.5 F (36.9 C) (09/10 1215) Pulse Rate:  [35-93] 80 (09/10 0900) Resp:  [20-29] 26 (09/10 0900) BP: (82-103)/(53-66) 103/66 (09/10 0600) SpO2:  [76 %-100 %] 98 % (09/10 0900) Arterial Line BP: (88-142)/(39-60) 119/48 (09/10 0945) Weight:  [217 lb 13 oz (98.8 kg)] 217 lb 13 oz (98.8 kg) (09/10 0500) Last BM Date: 12/12/16 General:   Somnolent but arouseable, appears uncomfortable Head:  Normocephalic and atraumatic. Eyes:  Sclera icteric   Conjunctiva pink. Ears:  Normal auditory acuity. Nose:  No deformity, discharge,  or lesions. Mouth:  No deformity or lesions.  Oropharynx pale and dry Neck:  Supple; no masses or thyromegaly. Abdomen:  Soft but focal RUQ tenderness with + Murphy's sign; guarding and rebound tenderness noted. Msk:  Symmetrical without gross deformities. Normal posture. Pulses:  Normal pulses noted. Extremities:  Without clubbing or  edema. Neurologic:  Somnolent, can direct for pain but can't answer questions directly Skin:  Jaundiced, scattered ecchymoses, otherwise intact without significant lesions or rashes. Psych:  Somnolent but arousable   Lab Results:  Recent Labs  12/12/16 0315 12/13/16 0335 12/14/16 0318  WBC 15.9* 14.9* 16.7*  HGB 8.2* 8.0* 8.6*  HCT 25.7* 24.8* 27.1*  PLT 88* 99* 118*   BMET  Recent Labs  12/12/16 1732 12/13/16 0335 12/14/16 0318  NA 142 145 148*  K 3.5 3.3* 3.3*  CL 107 111 115*  CO2 GLUCOSE 171* 188* 160*  BUN 71* 74* 73*  CREATININE 1.20 1.04 0.88  CALCIUM 7.9* 7.8* 7.9*   LFT  Recent Labs  12/12/16 0315  12/14/16 0318  PROT 5.0*  < > 5.0*  ALBUMIN 2.2*  < > 1.9*  AST 102*  < > 123*  ALT 51  < > 66*  ALKPHOS 77  < > 99  BILITOT 11.5*  < > 17.8*  BILIDIR 7.0*  --   --   IBILI 4.5*  --   --   < > = values in this interval not displayed. PT/INR No results for input(s): LABPROT, INR in the last 72 hours.  Studies/Results: No results found.  Impression:  1.  RUQ pain with + murphy's sign.  No biliary obstruction; + sludge; no gallstones; + gb wall thickening.   2.  Elevated LFTs. 3.  Hip fracture with surgery, complicated post-operative course. 4.  Overall constellation of findings worrisome for acalculous cholecystitis.  Plan:  1.  I have asked PCCM team to obtain surgical consultation.  Patient may need HIDA scan +/- percutaneous cholecystostomy tube.  Too ill for cholecystectomy at this time. 2.  Antibiotics. 3.  Nothing else for [place] to offer from GI perspective; will sign-off; please call with questions.   LOS: 11 days   Tannie Koskela M  12/14/2016, 12:38 PM  Cell [phone] If no answer or after 5 PM call [phone]

## 2016-12-14 NOTE — Progress Notes (Signed)
Subjective: 6 Days Post-Op Procedure(s) (LRB): INTRAMEDULLARY (IM) NAIL INTERTROCHANTRIC (Left)  Patient resting in bed.  Daughter is present.  She reports that he has been difficult to arouse and has a difficult time hearing at baseline.  Objective:   VITALS:  Temp:  [97.6 F (36.4 C)-100.4 F (38 C)] 100.4 F (38 C) (09/10 2100) Pulse Rate:  [74-95] 95 (09/10 2100) Resp:  [22-33] 32 (09/10 2100) BP: (85-103)/(53-66) 97/65 (09/10 2100) SpO2:  [95 %-100 %] 95 % (09/10 2100) Arterial Line BP: (88-127)/(39-53) 100/45 (09/10 2100) Weight:  [98.8 kg (217 lb 13 oz)] 98.8 kg (217 lb 13 oz) (09/10 0500)  General: Elderly male patient in NAD. Psych:  Not able to assess.  Skin:  Dressing C/D/I, no rashes or lesions Extremities: warm/dry, mild edema, no erythema or echymosis.  No lymphadenopathy. Pulses: Popliteus 1+, DP 1 + on LLE MSK:  Unable to assess appropriately.  Patient is moving his L ankle while sleeping, and responds to LT throughout foot and ankle       No results for input(s): LABPT, INR in the last 72 hours.   Assessment/Plan: 6 Days Post-Op Procedure(s) (LRB): INTRAMEDULLARY (IM) NAIL INTERTROCHANTRIC (Left)  50% WB L LE once stable enough to get out of bed.  Chemical DVT ppx per primary team, currently asa and SCDs  Critical care per primary care team  OT/PT when able  Yolonda Kida

## 2016-12-14 NOTE — Progress Notes (Signed)
Patient bottom denture/bridge is at bedside in pink denture cup.  Family aware.

## 2016-12-14 NOTE — Progress Notes (Signed)
PULMONARY / CRITICAL CARE MEDICINE   Name: Danny Oneill MRN: [id number] DOB: 1931/10/01    ADMISSION DATE:  12/03/2016 CONSULTATION DATE:  12/08/2016  REFERRING MD:  OR   CHIEF COMPLAINT:  Hypotension  HISTORY OF PRESENT ILLNESS:   83 yo male admitted with Lt hip comminuted fracture after fall at home.  Developed agitated delirium while in hospital.  Had Lt intertrochanteric nailing on 9/04 with EBL 200 ml.  Noted to have urine retention.  Post op had hypotension, progressive anemia, and required reintubation.  Found to have E coli UTI.   PMHx of CAD, systolic CHF with EF 45 to 50%, HLD, PAF, CKD, Asbestosis exposure, BPH.  SUBJECTIVE:  Diarrhea overnight  Not required BIPAP  Still encephalopathic   VITAL SIGNS: BP 103/66   Pulse 80   Temp 97.9 F (36.6 C) (Oral)   Resp (!) 27   Ht 6\' 1"  (1.854 m)   Wt 217 lb 13 oz (98.8 kg)   SpO2 98%   BMI 28.74 kg/m   INTAKE / OUTPUT:  Intake/Output Summary (Last 24 hours) at 12/14/16 0947 Last data filed at 12/14/16 0700  Gross per 24 hour  Intake          2839.51 ml  Output             1625 ml  Net          1214.51 ml    PHYSICAL EXAMINATION: General appearance:  83 Year old chronically ill appearing Male, confused, not in acute distress. Eyes: sclerae icteric , moist conjunctivae; PERRL, EOMI bilaterally. Mouth:  Membranes are dry Neck: Trachea midline; neck supple, no JVD Lungs/chest: CTA, with normal respiratory effort and no intercostal retractions CV: RRR, no MRGs  Abdomen: Soft, tender to palp of RUQ + murphy's  Extremities: No peripheral edema or extremity lymphadenopathy Skin: warm, jaundice, scattered areas of ecchymosis. Diffuse anasarca Psych: moaning. Encephalopathic No f/c    LABS:  BMET  Recent Labs Lab 12/12/16 1732 12/13/16 0335 12/14/16 0318  NA 142 145 148*  K 3.5 3.3* 3.3*  CL 107 111 115*  CO2 25 26 26   BUN 71* 74* 73*  CREATININE 1.20 1.04 0.88  GLUCOSE 171* 188* 160*     Electrolytes  Recent Labs Lab 12/12/16 1732 12/13/16 0335 12/13/16 1739 12/14/16 0318  CALCIUM 7.9* 7.8*  --  7.9*  MG 2.3 2.2 2.2  --   PHOS 2.5 1.6* 2.6  --     CBC  Recent Labs Lab 12/12/16 0315 12/13/16 0335 12/14/16 0318  WBC 15.9* 14.9* 16.7*  HGB 8.2* 8.0* 8.6*  HCT 25.7* 24.8* 27.1*  PLT 88* 99* 118*    Coag's  Recent Labs Lab 12/08/16 2301  INR 2.34    Sepsis Markers  Recent Labs Lab 12/08/16 2120 12/09/16 0345  LATICACIDVEN 6.7* 6.5*    ABG  Recent Labs Lab 12/09/16 0546 12/10/16 0302 12/12/16 0320  PHART 7.357 7.411 7.402  PCO2ART 33.5 35.3 37.1  PO2ART 116.0* 125.0* 74.0*    Liver Enzymes  Recent Labs Lab 12/12/16 0315 12/13/16 0335 12/14/16 0318  AST 102* 88* 123*  ALT 51 50 66*  ALKPHOS 77 107 99  BILITOT 11.5* 14.6* 17.8*  ALBUMIN 2.2* 1.9* 1.9*    Cardiac Enzymes  Recent Labs Lab 12/09/16 0558 12/09/16 1139 12/10/16 1018  TROPONINI 0.06* 0.07* 0.07*    Glucose  Recent Labs Lab 12/13/16 1344 12/13/16 1601 12/13/16 1928 12/13/16 2322 12/14/16 0327 12/14/16 0840  GLUCAP 113* 122* 138* 129*  141* 148*    Imaging No results found.  STUDIES:  Abd u/s 9/08 >>No gallstones. There is slight sludge in the gallbladder and there is slight diffuse thickening of the gallbladder wall. Negative sonographic Murphy's sign.  CULTURES: 9/4 BC >> negative 9/4 UC >> E coli  ANTIBIOTICS: 9/4 ceftriaxone >> 9/8 9/8 ampicillin >> 9/10 Zosyn 9/10>>>  SIGNIFICANT EVENTS: 8/30 Admit 9/04  Surgery/ tx to ICU for hypotension  LINES/TUBES: 9/4 Left Radial Aline >> 9/4 R TL IJ CVC >>  9/4 ETT >> 9/6  DISCUSSION: 83 yo with fall and hip fx.  Developed shock and VDRF after surgery from E coli UTI with sepsis and blood loss after surgery.  ASSESSMENT / PLAN:  Septic shock from E coli UTI. -no fever spikes -wbc stabe -pressor requirements still Plan Amp day #3; abx day 7, widening to zosyn for possible  cholecystitis  Cont to wean levophed Avoid volume depletion  Elevated LFTs (cont to worsen) Elevated ammonia. Jaundice  R/o acalculous colicystitis  -abd [place] w/ GB sludge and diffuse thickening;  but no stones or murphy's sign  Plan Trend LFTs Cont lactulose  Holding lipitor Ask GI to see. May need perc drainage w/ rising LFTs and wbc flat  Keep NPO   Acute hypoxic respiratory failure. Hx of asbestos related pleural plaques. -extubated 8/6 Plan Wean oxygen  F/u CXR  Hx of CAD s/p DES 02/2016, chronic combined CHF, HLD, PAF. Plan Cont asa Stop brillinta again.    Acute renal failure >> improved/resolved Hx of BPH with urine retention. Hypernatremia, hypokalemia and hyperchloremia -->has free water def 2.8 liters Plan Add free water w/ D5w at 50 ml/hr w/ goal to replace 1/2 water def in next 24hrs Replace K Repeat afternoon and am chemistry  Dysphagia. Moderate protein calorie malnutrition. Plan Cont tube feeds  ABLA. Thrombocytopenia. -stable Plan Trend cbc scds  Acute metabolic encephalopathy. -likely multifactorial  Plan Cont supportive care Cont lactulose   Hyperglycemia Plan ssi   Lt intertrochantric intramedullary nailing 12/08/16. Plan Cont care per ortho   DVT prophylaxis - SCD SUP - Not indicated Nutrition - tube feeds Goals of care - full code  Updated pt's family at bedside  My cct 45 min   Simonne Martinet ACNP-BC [facility] Pulmonary/Critical Care Pager # [phone] OR # [phone] if no answer

## 2016-12-14 NOTE — Consult Note (Signed)
Reason for Consult:  cholecystitis Referring Physician: Marni Griffon NP - CCM  Danny Oneill is an 83 y.o. male.  HPI:  Pt admitted on 12/04/16 with a comminuted left hip peritrochanteric fracture.he has his multiple medical issues he was admitted by medicine.  He was seen in consultation by Dr. Nicholes Stairs.he was scheduled for operative fixation via intramedullary nail. Visit is extensive coronary disease history of seen cardiology.was their recommendation that surgery be held for 5 days while his Brillanta washed out.she was rescheduled and underwent surgery on 12/08/16.postoperatively became critically ill with confusion, sepsis,hypoxemia requiring postop intubation.  Patient was seen by critical care medicine at this point;  he was agitated with delirium, hypoxic shock in the setting of acute blood loss and coagulopathy. He was extubated on 12/10/16.  He was placed on BiPAP on a when necessary basis post extubation.elevated troponins postop due to demand ischemia in the setting of anemia and hypertension. Diagnosis septic shock from Escherichia coli UTI was made on 12/12/16.dysphagia with moderate protein Calorie Malnutrition Was Made and the Core Tract Feeding Tube Was Placed on 12/12/16. On exam 9/10/18he was noted to be tender in the right upper quadrant and jaundice. Gallbladder ultrasound was obtained, which show gallbladder wall thickening, sludge but no gallstones in the gallbladder and no biliary dilatation.   He is afebrile, respiratory rate in the 20s.he is off vasopressin but still on norepinephrine. Labs show a normal alkaline phosphatase 99. AST 123, a LT 66 total bilirubin 17.8.ilirubin has been up since 12/11/16.WBC 16.7, hemoglobin8.6, hematocrit 27, platelets 118,000.blood cultures from 12/08/16 were negative. Urine culture 12/09/16, improve out E coli.  We are asked to see.  Past Medical History:  Diagnosis Date  . Anemia   . Aortic insufficiency 03/09/2016   a. mild-mod AI by echo  03/04/16.  . Arthritis   . Asbestosis ([place])    "no breathing problems" pt states worked in [employer] for many yrs and was exposed to asbestosis  . Bladder outlet obstruction 03/09/2016  . CAD (coronary artery disease)    a. 02/2016: inf lat STEMI with occluded OM2 which was treated with DES; otherwise he had moderate 50% oRCA, 40% oLM, 70% D1, 10% mLAD, 80% lateral 2nd marg which was treated medically.  . Cardiomyopathy, ischemic   . Chronic combined systolic and diastolic CHF (congestive heart failure) (HCC)    a. EF 30-35% at time of STEMI 02/2016, improved to 40-45% by echo 11/20 (but with pericardial effusion). b. Further improved EF to 55-60% 03/04/16.  Marland Kitchen CKD (chronic kidney disease), stage III   . Coronary artery disease   . Dyspnea   . First degree AV block   . Hip fracture ([place]) 11/2016  . Hyperlipidemia   . Hyponatremia   . Lumbar spondylosis   . Myocardial infarction ([place])   . Orthostatic hypotension   . PAF (paroxysmal atrial fibrillation) ([place])    a. dx 02/2016: not anticoagulated due to pericardial effusion; amiodarone stopped due to suspected allergic drug rash.  . Pericardial effusion    a. 02/2016 - followed clinically (mod-large at first then small-mod in f/u echo).    Past Surgical History:  Procedure Laterality Date  . CARDIAC CATHETERIZATION N/A 02/02/2016   Procedure: Left Heart Cath and Coronary Angiography;  Surgeon: Jettie Booze, MD;  Location: [facility];  Service: Cardiovascular;  Laterality: N/A;  . CARDIAC CATHETERIZATION N/A 02/02/2016   Procedure: Coronary Stent Intervention;  Surgeon: Jettie Booze, MD;  Location: [place] [facility];  Service: Cardiovascular;  Laterality: N/A;  . CARDIAC CATHETERIZATION N/A 02/02/2016   Procedure: IABP Insertion;  Surgeon: Jettie Booze, MD;  Location: [place] [facility];  Service: Cardiovascular;  Laterality: N/A;  . CATARACTS REMOVED     BIL  . CORONARY STENT PLACEMENT  01/2016  .  INTRAMEDULLARY (IM) NAIL INTERTROCHANTERIC Left 12/08/2016   Procedure: INTRAMEDULLARY (IM) NAIL INTERTROCHANTRIC;  Surgeon: Nicholes Stairs, MD;  Location: [place];  Service: Orthopedics;  Laterality: Left;  . ROTATOR CUFF REPAIR  2011   L SHOULDER  . TOTAL KNEE ARTHROPLASTY Right 10/03/2013   Procedure: RIGHT TOTAL KNEE ARTHROPLASTY;  Surgeon: Mauri Pole, MD;  Location: [facility];  Service: Orthopedics;  Laterality: Right;    Family History  Problem Relation Age of Onset  . Cancer Mother     Social History:  reports that he has never smoked. He has never used smokeless tobacco. He reports that he does not drink alcohol or use drugs.  Allergies:  Allergies  Allergen Reactions  . Amiodarone Rash  . Morphine And Related Other (See Comments)    Confusion/ hallucinations     Medications:  Prior to Admission:  Prescriptions Prior to Admission  Medication Sig Dispense Refill Last Dose  . aspirin EC 81 MG tablet Take 81 mg by mouth daily with breakfast.    12/03/2016 at Unknown time  . atorvastatin (LIPITOR) 20 MG tablet Take 1 tablet (20 mg total) by mouth daily. 30 tablet 11 12/03/2016 at Unknown time  . carvedilol (COREG) 3.125 MG tablet Take 3.125 mg by mouth 2 (two) times daily.  3 12/03/2016 at 10am  . furosemide (LASIX) 20 MG tablet Take 1 tablet by mouth daily and ok to use extra tablet as needed for swelling. 180 tablet 3 12/03/2016 at Unknown time  . lisinopril (PRINIVIL,ZESTRIL) 2.5 MG tablet Take 1 tablet (2.5 mg total) by mouth daily. 90 tablet 3 12/03/2016 at Unknown time  . tamsulosin (FLOMAX) 0.4 MG CAPS capsule TAKE 1 CAPSULE(0.4 MG) BY MOUTH DAILY AFTER SUPPER 90 capsule 3 12/02/2016 at Unknown time  . ticagrelor (BRILINTA) 90 MG TABS tablet Take 1 tablet (90 mg total) by mouth 2 (two) times daily. 60 tablet 11 12/03/2016 at Unknown time  . carvedilol (COREG) 6.25 MG tablet Take 1 tablet (6.25 mg total) by mouth 2 (two) times daily. (Patient not taking: Reported on 12/03/2016)  60 tablet 11 Not Taking at 10am   Scheduled: . aspirin  81 mg Per Tube Daily  . bacitracin  1 application Topical BID  . chlorhexidine  15 mL Mouth Rinse BID  . insulin aspart  0-9 Units Subcutaneous Q4H  . lactulose  30 g Per Tube BID  . mouth rinse  15 mL Mouth Rinse q12n4p  . potassium chloride  40 mEq Per Tube Once  . tamsulosin  0.4 mg Oral Daily   Continuous: . sodium chloride    . dextrose 50 mL/hr at 12/14/16 1308  . norepinephrine (LEVOPHED) Adult infusion 5 mcg/min (12/14/16 1236)  . piperacillin-tazobactam (ZOSYN)  IV     Anti-infectives    Start     Dose/Rate Route Frequency Ordered Stop   12/14/16 1215  piperacillin-tazobactam (ZOSYN) IVPB 3.375 g     3.375 g 12.5 mL/hr over 240 Minutes Intravenous Every 8 hours 12/14/16 1149     12/12/16 1200  ampicillin (OMNIPEN) 1 g in sodium chloride 0.9 % 50 mL IVPB  Status:  Discontinued     1 g 150 mL/hr over 20  Minutes Intravenous Every 6 hours 12/12/16 1031 12/14/16 1127   12/09/16 0100  ceFAZolin (ANCEF) IVPB 1 g/50 mL premix     1 g 100 mL/hr over 30 Minutes Intravenous Every 6 hours 12/08/16 2359 12/09/16 1557   12/08/16 2300  cefTRIAXone (ROCEPHIN) 1 g in dextrose 5 % 50 mL IVPB  Status:  Discontinued     1 g 100 mL/hr over 30 Minutes Intravenous Every 24 hours 12/08/16 2231 12/12/16 1002   12/04/16 0600  ceFAZolin (ANCEF) IVPB 2g/100 mL premix     2 g 200 mL/hr over 30 Minutes Intravenous On call to O.R. 12/03/16 1641 12/05/16 0559     . sodium chloride    . dextrose 50 mL/hr at 12/14/16 1308  . norepinephrine (LEVOPHED) Adult infusion 5 mcg/min (12/14/16 1236)  . piperacillin-tazobactam (ZOSYN)  IV      Results for orders placed or performed during the hospital encounter of 12/03/16 (from the past 48 hour(s))  Magnesium     Status: None   Collection Time: 12/12/16  5:32 PM  Result Value Ref Range   Magnesium 2.3 1.7 - 2.4 mg/dL  Phosphorus     Status: None   Collection Time: 12/12/16  5:32 PM  Result Value  Ref Range   Phosphorus 2.5 2.5 - 4.6 mg/dL  Basic metabolic panel     Status: Abnormal   Collection Time: 12/12/16  5:32 PM  Result Value Ref Range   Sodium 142 135 - 145 mmol/L   Potassium 3.5 3.5 - 5.1 mmol/L   Chloride 107 101 - 111 mmol/L   CO2 25 22 - 32 mmol/L   Glucose, Bld 171 (H) 65 - 99 mg/dL   BUN 71 (H) 6 - 20 mg/dL   Creatinine, Ser 1.20 0.61 - 1.24 mg/dL   Calcium 7.9 (L) 8.9 - 10.3 mg/dL   GFR calc non Af Amer 54 (L) >60 mL/min   GFR calc Af Amer >60 >60 mL/min    Comment: (NOTE) The eGFR has been calculated using the CKD EPI equation. This calculation has not been validated in all clinical situations. eGFR's persistently <60 mL/min signify possible Chronic Kidney Disease.    Anion gap 10 5 - 15  Glucose, capillary     Status: Abnormal   Collection Time: 12/12/16  7:41 PM  Result Value Ref Range   Glucose-Capillary 184 (H) 65 - 99 mg/dL   Comment 1 Notify RN   Glucose, capillary     Status: Abnormal   Collection Time: 12/12/16 11:32 PM  Result Value Ref Range   Glucose-Capillary 197 (H) 65 - 99 mg/dL   Comment 1 Notify RN   Magnesium     Status: None   Collection Time: 12/13/16  3:35 AM  Result Value Ref Range   Magnesium 2.2 1.7 - 2.4 mg/dL  Phosphorus     Status: Abnormal   Collection Time: 12/13/16  3:35 AM  Result Value Ref Range   Phosphorus 1.6 (L) 2.5 - 4.6 mg/dL  Comprehensive metabolic panel     Status: Abnormal   Collection Time: 12/13/16  3:35 AM  Result Value Ref Range   Sodium 145 135 - 145 mmol/L   Potassium 3.3 (L) 3.5 - 5.1 mmol/L   Chloride 111 101 - 111 mmol/L   CO2 26 22 - 32 mmol/L   Glucose, Bld 188 (H) 65 - 99 mg/dL   BUN 74 (H) 6 - 20 mg/dL   Creatinine, Ser 1.04 0.61 - 1.24 mg/dL   Calcium  7.8 (L) 8.9 - 10.3 mg/dL   Total Protein 5.2 (L) 6.5 - 8.1 g/dL   Albumin 1.9 (L) 3.5 - 5.0 g/dL   AST 88 (H) 15 - 41 U/L   ALT 50 17 - 63 U/L   Alkaline Phosphatase 107 38 - 126 U/L   Total Bilirubin 14.6 (H) 0.3 - 1.2 mg/dL   GFR calc  non Af Amer >60 >60 mL/min   GFR calc Af Amer >60 >60 mL/min    Comment: (NOTE) The eGFR has been calculated using the CKD EPI equation. This calculation has not been validated in all clinical situations. eGFR's persistently <60 mL/min signify possible Chronic Kidney Disease.    Anion gap 8 5 - 15  CBC     Status: Abnormal   Collection Time: 12/13/16  3:35 AM  Result Value Ref Range   WBC 14.9 (H) 4.0 - 10.5 K/uL   RBC 2.72 (L) 4.22 - 5.81 MIL/uL   Hemoglobin 8.0 (L) 13.0 - 17.0 g/dL   HCT 24.8 (L) 39.0 - 52.0 %   MCV 91.2 78.0 - 100.0 fL   MCH 29.4 26.0 - 34.0 pg   MCHC 32.3 30.0 - 36.0 g/dL   RDW 17.3 (H) 11.5 - 15.5 %   Platelets 99 (L) 150 - 400 K/uL    Comment: CONSISTENT WITH PREVIOUS RESULT  Ammonia     Status: Abnormal   Collection Time: 12/13/16  3:35 AM  Result Value Ref Range   Ammonia 57 (H) 9 - 35 umol/L  Hemoglobin A1c     Status: Abnormal   Collection Time: 12/13/16  3:35 AM  Result Value Ref Range   Hgb A1c MFr Bld 5.8 (H) 4.8 - 5.6 %    Comment: (NOTE)         Prediabetes: 5.7 - 6.4         Diabetes: >6.4         Glycemic control for adults with diabetes: <7.0    Mean Plasma Glucose 120 mg/dL    Comment: (NOTE) Performed At: [facility] [address] [place], [place] [id number] Lindon Romp MD YT:[id number]   Glucose, capillary     Status: Abnormal   Collection Time: 12/13/16  3:39 AM  Result Value Ref Range   Glucose-Capillary 184 (H) 65 - 99 mg/dL   Comment 1 Notify RN   Glucose, capillary     Status: Abnormal   Collection Time: 12/13/16  8:26 AM  Result Value Ref Range   Glucose-Capillary 173 (H) 65 - 99 mg/dL   Comment 1 Notify RN   Glucose, capillary     Status: Abnormal   Collection Time: 12/13/16  1:44 PM  Result Value Ref Range   Glucose-Capillary 113 (H) 65 - 99 mg/dL  Glucose, capillary     Status: Abnormal   Collection Time: 12/13/16  4:01 PM  Result Value Ref Range   Glucose-Capillary 122 (H) 65 - 99 mg/dL   Comment  1 Capillary Specimen    Comment 2 Notify RN   Magnesium     Status: None   Collection Time: 12/13/16  5:39 PM  Result Value Ref Range   Magnesium 2.2 1.7 - 2.4 mg/dL  Phosphorus     Status: None   Collection Time: 12/13/16  5:39 PM  Result Value Ref Range   Phosphorus 2.6 2.5 - 4.6 mg/dL  Glucose, capillary     Status: Abnormal   Collection Time: 12/13/16  7:28 PM  Result Value Ref Range  Glucose-Capillary 138 (H) 65 - 99 mg/dL   Comment 1 Notify RN   Glucose, capillary     Status: Abnormal   Collection Time: 12/13/16 11:22 PM  Result Value Ref Range   Glucose-Capillary 129 (H) 65 - 99 mg/dL   Comment 1 Notify RN   Comprehensive metabolic panel     Status: Abnormal   Collection Time: 12/14/16  3:18 AM  Result Value Ref Range   Sodium 148 (H) 135 - 145 mmol/L   Potassium 3.3 (L) 3.5 - 5.1 mmol/L   Chloride 115 (H) 101 - 111 mmol/L   CO2 26 22 - 32 mmol/L   Glucose, Bld 160 (H) 65 - 99 mg/dL   BUN 73 (H) 6 - 20 mg/dL   Creatinine, Ser 0.88 0.61 - 1.24 mg/dL   Calcium 7.9 (L) 8.9 - 10.3 mg/dL   Total Protein 5.0 (L) 6.5 - 8.1 g/dL   Albumin 1.9 (L) 3.5 - 5.0 g/dL   AST 123 (H) 15 - 41 U/L   ALT 66 (H) 17 - 63 U/L   Alkaline Phosphatase 99 38 - 126 U/L   Total Bilirubin 17.8 (H) 0.3 - 1.2 mg/dL   GFR calc non Af Amer >60 >60 mL/min   GFR calc Af Amer >60 >60 mL/min    Comment: (NOTE) The eGFR has been calculated using the CKD EPI equation. This calculation has not been validated in all clinical situations. eGFR's persistently <60 mL/min signify possible Chronic Kidney Disease.    Anion gap 7 5 - 15  CBC     Status: Abnormal   Collection Time: 12/14/16  3:18 AM  Result Value Ref Range   WBC 16.7 (H) 4.0 - 10.5 K/uL   RBC 2.94 (L) 4.22 - 5.81 MIL/uL   Hemoglobin 8.6 (L) 13.0 - 17.0 g/dL   HCT 27.1 (L) 39.0 - 52.0 %   MCV 92.2 78.0 - 100.0 fL   MCH 29.3 26.0 - 34.0 pg   MCHC 31.7 30.0 - 36.0 g/dL   RDW 17.4 (H) 11.5 - 15.5 %   Platelets 118 (L) 150 - 400 K/uL     Comment: CONSISTENT WITH PREVIOUS RESULT  Glucose, capillary     Status: Abnormal   Collection Time: 12/14/16  3:27 AM  Result Value Ref Range   Glucose-Capillary 141 (H) 65 - 99 mg/dL   Comment 1 Notify RN   Glucose, capillary     Status: Abnormal   Collection Time: 12/14/16  8:40 AM  Result Value Ref Range   Glucose-Capillary 148 (H) 65 - 99 mg/dL   Comment 1 Notify RN   Glucose, capillary     Status: Abnormal   Collection Time: 12/14/16 12:14 PM  Result Value Ref Range   Glucose-Capillary 124 (H) 65 - 99 mg/dL   Comment 1 Capillary Specimen    Comment 2 Document in Chart     No results found.  Review of Systems  Unable to perform ROS: Mental acuity   Blood pressure 103/66, pulse 80, temperature 98.5 F (36.9 C), temperature source Axillary, resp. rate (!) 26, height '6\' 1"'  (1.854 m), weight 98.8 kg (217 lb 13 oz), SpO2 98 %. Physical Exam  Constitutional:  Ill male, his wife is at the bedside.  She says sometimes he talks and sometimes he does not.  He has not really spoken to her today.  He is awake but not responding to anyone.  HENT:  Head: Normocephalic.  Mouth/Throat: No oropharyngeal exudate.  Coretrack feeding tube  in place  Eyes: Right eye exhibits no discharge. Left eye exhibits no discharge. Scleral icterus is present.  Pupils are equal  Neck: Normal range of motion. Neck supple. No JVD present. No tracheal deviation present. No thyromegaly present.  Cardiovascular: Regular rhythm and normal heart sounds.   No murmur heard. He is a little tachycardic. He has mild edema and I did not feel distal pulses.  Respiratory: Effort normal and breath sounds normal. No stridor. No respiratory distress. He has no wheezes. He has no rales. He exhibits no tenderness.  His RR is up mid 20's  GI: Soft. He exhibits no distension. There is tenderness (it is really hard to tell if he has tenderness or not. you get some response, but I'm not sure it from pain or just manipulation).   Musculoskeletal: He exhibits edema (+1-2 edema ). He exhibits no tenderness.  Lymphadenopathy:    He has no cervical adenopathy.  Neurological: No cranial nerve deficit.  He is not answer questions or tracking with exam this AM  Skin: Skin is warm and dry. No rash noted. No erythema. No pallor.  Psychiatric: He has a normal mood and affect. His behavior is normal. Judgment and thought content normal.   . sodium chloride    . dextrose 50 mL/hr at 12/14/16 1308  . norepinephrine (LEVOPHED) Adult infusion 5 mcg/min (12/14/16 1236)  . piperacillin-tazobactam (ZOSYN)  IV     Anti-infectives    Start     Dose/Rate Route Frequency Ordered Stop   12/14/16 1215  piperacillin-tazobactam (ZOSYN) IVPB 3.375 g     3.375 g 12.5 mL/hr over 240 Minutes Intravenous Every 8 hours 12/14/16 1149     12/12/16 1200  ampicillin (OMNIPEN) 1 g in sodium chloride 0.9 % 50 mL IVPB  Status:  Discontinued     1 g 150 mL/hr over 20 Minutes Intravenous Every 6 hours 12/12/16 1031 12/14/16 1127   12/09/16 0100  ceFAZolin (ANCEF) IVPB 1 g/50 mL premix     1 g 100 mL/hr over 30 Minutes Intravenous Every 6 hours 12/08/16 2359 12/09/16 1557   12/08/16 2300  cefTRIAXone (ROCEPHIN) 1 g in dextrose 5 % 50 mL IVPB  Status:  Discontinued     1 g 100 mL/hr over 30 Minutes Intravenous Every 24 hours 12/08/16 2231 12/12/16 1002   12/04/16 0600  ceFAZolin (ANCEF) IVPB 2g/100 mL premix     2 g 200 mL/hr over 30 Minutes Intravenous On call to O.R. 12/03/16 1641 12/05/16 0559      Assessment/Plan: Left hip fracture S/p Treatment of intertrochanteric, pertrochanteric, subtrochanteric fracture with intramedullary implant,12/08/16 Dr. Halford Chessman coli uti Worsening LFT's, gallbladder sludge/? RUQ tenderness - r/o cholecystitis Progressive encephalopathy - uncertain etiology CAD s/p DES on Brillinta & aspirin Chronic combined CHF Postop encephalopathy Postop shock with metabolic acidosis. FEN:  IV fluids, low dose  Levophed/NPO/ TF off ID:  Ancef 8/31-12/09/16;Rocephin 12/08/16; Ampicillin 9/8-9/10/18  Zosyn 12/14/16 started DVT:  Brillinta    Plan:  HIDA scan is ordered, with his Bilirubin that high I'm not sure it will help. Antibiotics broadened, He is NPO and has been seen by GI.  I have recommended checking INR.  We will follow and Dr. Brantley Stage will see shortly between cases.   Danny Oneill 12/14/2016, 12:50 PM

## 2016-12-15 ENCOUNTER — Inpatient Hospital Stay (HOSPITAL_COMMUNITY): Payer: PPO

## 2016-12-15 ENCOUNTER — Encounter (HOSPITAL_COMMUNITY): Payer: Self-pay | Admitting: Interventional Radiology

## 2016-12-15 HISTORY — PX: IR PERC CHOLECYSTOSTOMY: IMG2326

## 2016-12-15 LAB — CBC
HEMATOCRIT: 30.4 % — AB (ref 39.0–52.0)
Hemoglobin: 9.7 g/dL — ABNORMAL LOW (ref 13.0–17.0)
MCH: 30 pg (ref 26.0–34.0)
MCHC: 31.9 g/dL (ref 30.0–36.0)
MCV: 94.1 fL (ref 78.0–100.0)
Platelets: 182 10*3/uL (ref 150–400)
RBC: 3.23 MIL/uL — ABNORMAL LOW (ref 4.22–5.81)
RDW: 17.3 % — AB (ref 11.5–15.5)
WBC: 21.9 10*3/uL — AB (ref 4.0–10.5)

## 2016-12-15 LAB — GLUCOSE, CAPILLARY
GLUCOSE-CAPILLARY: 102 mg/dL — AB (ref 65–99)
GLUCOSE-CAPILLARY: 103 mg/dL — AB (ref 65–99)
GLUCOSE-CAPILLARY: 110 mg/dL — AB (ref 65–99)
GLUCOSE-CAPILLARY: 111 mg/dL — AB (ref 65–99)
GLUCOSE-CAPILLARY: 96 mg/dL (ref 65–99)
Glucose-Capillary: 112 mg/dL — ABNORMAL HIGH (ref 65–99)

## 2016-12-15 LAB — COMPREHENSIVE METABOLIC PANEL
ALT: 89 U/L — ABNORMAL HIGH (ref 17–63)
AST: 159 U/L — AB (ref 15–41)
Albumin: 1.8 g/dL — ABNORMAL LOW (ref 3.5–5.0)
Alkaline Phosphatase: 110 U/L (ref 38–126)
Anion gap: 7 (ref 5–15)
BILIRUBIN TOTAL: 19.6 mg/dL — AB (ref 0.3–1.2)
BUN: 60 mg/dL — AB (ref 6–20)
CALCIUM: 7.7 mg/dL — AB (ref 8.9–10.3)
CO2: 27 mmol/L (ref 22–32)
Chloride: 117 mmol/L — ABNORMAL HIGH (ref 101–111)
Creatinine, Ser: 0.92 mg/dL (ref 0.61–1.24)
GFR calc Af Amer: >60 mL/min (ref >60)
Glucose, Bld: 116 mg/dL — ABNORMAL HIGH (ref 65–99)
POTASSIUM: 4 mmol/L (ref 3.5–5.1)
Sodium: 151 mmol/L — ABNORMAL HIGH (ref 135–145)
TOTAL PROTEIN: 4.9 g/dL — AB (ref 6.5–8.1)

## 2016-12-15 LAB — PROTIME-INR
INR: 1.68
Prothrombin Time: 19.7 seconds — ABNORMAL HIGH (ref 11.4–15.2)

## 2016-12-15 LAB — APTT: aPTT: 33 seconds (ref 24–36)

## 2016-12-15 MED ORDER — IOPAMIDOL (ISOVUE-300) INJECTION 61%
INTRAVENOUS | Status: AC
  Administered 2016-12-15: 15 mL
  Filled 2016-12-15: qty 50

## 2016-12-15 MED ORDER — FENTANYL CITRATE (PF) 100 MCG/2ML IJ SOLN
INTRAMUSCULAR | Status: AC | PRN
  Administered 2016-12-15: 50 ug via INTRAVENOUS

## 2016-12-15 MED ORDER — SODIUM CHLORIDE 0.9% FLUSH
5.0000 mL | Freq: Three times a day (TID) | INTRAVENOUS | Status: DC
  Administered 2016-12-16 – 2016-12-25 (×18): 5 mL via INTRAVENOUS

## 2016-12-15 MED ORDER — MIDAZOLAM HCL 2 MG/2ML IJ SOLN
INTRAMUSCULAR | Status: AC | PRN
  Administered 2016-12-15: 1 mg via INTRAVENOUS

## 2016-12-15 MED ORDER — LIDOCAINE HCL (PF) 1 % IJ SOLN
INTRAMUSCULAR | Status: AC
  Filled 2016-12-15: qty 30

## 2016-12-15 MED ORDER — LIDOCAINE HCL 1 % IJ SOLN
INTRAMUSCULAR | Status: AC | PRN
  Administered 2016-12-15: 10 mL

## 2016-12-15 NOTE — Progress Notes (Signed)
PULMONARY / CRITICAL CARE MEDICINE   Name: Danny Oneill MRN: [id number] DOB: 1931/11/12    ADMISSION DATE:  12/03/2016 CONSULTATION DATE:  12/08/2016  REFERRING MD:  OR   CHIEF COMPLAINT:  Hypotension  HISTORY OF PRESENT ILLNESS:   83 yo male admitted with Lt hip comminuted fracture after fall at home.  Developed agitated delirium while in hospital.  Had Lt intertrochanteric nailing on 9/04 with EBL 200 ml.  Noted to have urine retention.  Post op had hypotension, progressive anemia, and required reintubation.  Found to have E coli UTI.   PMHx of CAD, systolic CHF with EF 45 to 50%, HLD, PAF, CKD, Asbestosis exposure, BPH.  SUBJECTIVE:  Looks worse   VITAL SIGNS: BP 102/60   Pulse 92   Temp 99.9 F (37.7 C) (Axillary)   Resp (!) 26   Ht 6\' 1"  (1.854 m)   Wt 218 lb 14.7 oz (99.3 kg)   SpO2 97%   BMI 28.88 kg/m   INTAKE / OUTPUT:  Intake/Output Summary (Last 24 hours) at 12/15/16 0844 Last data filed at 12/15/16 0600  Gross per 24 hour  Intake          1237.23 ml  Output             1775 ml  Net          -537.77 ml    PHYSICAL EXAMINATION: General appearance: 83 Year old  Acutely ill appearing Male, weaker, jaundice. Moans  Eyes: sclerae icteric , moist conjunctivae; PERRL, EOMI bilaterally. Mouth:  membranes and mucosa are dry Neck: Trachea midline; neck supple, no JVD Lungs/chest: occ rhonchi, with normal respiratory effort and no intercostal retractions CV: RRR, no MRGs  Abdomen: Soft, very tender to RUQ + murphy's  Extremities: diffuse anasarca Skin: Normal temperature, turgor and texture; jaundice  Psych: profoundly weak, moves all extremities. moans    LABS:  BMET  Recent Labs Lab 12/13/16 0335 12/14/16 0318 12/15/16 0521  NA 145 148* 151*  K 3.3* 3.3* 4.0  CL 111 115* 117*  CO2 26 26 27   BUN 74* 73* 60*  CREATININE 1.04 0.88 0.92  GLUCOSE 188* 160* 116*    Electrolytes  Recent Labs Lab 12/12/16 1732 12/13/16 0335 12/13/16 1739  12/14/16 0318 12/15/16 0521  CALCIUM 7.9* 7.8*  --  7.9* 7.7*  MG 2.3 2.2 2.2  --   --   PHOS 2.5 1.6* 2.6  --   --     CBC  Recent Labs Lab 12/13/16 0335 12/14/16 0318 12/15/16 0521  WBC 14.9* 16.7* 21.9*  HGB 8.0* 8.6* 9.7*  HCT 24.8* 27.1* 30.4*  PLT 99* 118* 182    Coag's  Recent Labs Lab 12/08/16 2301  INR 2.34    Sepsis Markers  Recent Labs Lab 12/08/16 2120 12/09/16 0345  LATICACIDVEN 6.7* 6.5*    ABG  Recent Labs Lab 12/09/16 0546 12/10/16 0302 12/12/16 0320  PHART 7.357 7.411 7.402  PCO2ART 33.5 35.3 37.1  PO2ART 116.0* 125.0* 74.0*    Liver Enzymes  Recent Labs Lab 12/13/16 0335 12/14/16 0318 12/15/16 0521  AST 88* 123* 159*  ALT 50 66* 89*  ALKPHOS 107 99 110  BILITOT 14.6* 17.8* 19.6*  ALBUMIN 1.9* 1.9* 1.8*    Cardiac Enzymes  Recent Labs Lab 12/09/16 0558 12/09/16 1139 12/10/16 1018  TROPONINI 0.06* 0.07* 0.07*    Glucose  Recent Labs Lab 12/14/16 1214 12/14/16 1618 12/14/16 1933 12/14/16 2325 12/15/16 0304 12/15/16 0720  GLUCAP 124* 107* 98 105*  96 112*    Imaging Dg Chest Port 1 View  Result Date: 12/15/2016 CLINICAL DATA:  Prior history of respiratory failure. Asbestosis. Coronary artery disease. EXAM: PORTABLE CHEST 1 VIEW COMPARISON:  12/10/2016. FINDINGS: Interim extubation. Interim removal of NG tube and placement of feeding tube. Right IJ line stable position. Heart size normal. Chronic bilateral from interstitial prominence with bilateral calcified pleural plaques are noted consistent with history of asbestosis. No new pulmonary findings. IMPRESSION: For 1 interim extubation. Interim removal of NG tube replacement feeding tube. Right IJ line stable position. 2. Bilateral pulmonary and pleural changes of asbestosis again noted without interim change. No acute pulmonary abnormality identified. Electronically Signed   By: Maisie Fus  Register   On: 12/15/2016 07:11    STUDIES:  Abd u/s 9/08 >>No gallstones.  There is slight sludge in the gallbladder and there is slight diffuse thickening of the gallbladder wall. Negative sonographic Murphy's sign.  CULTURES: 9/4 BC >> negative 9/4 UC >> E coli 9/11 blood culture>>>  ANTIBIOTICS: 9/4 ceftriaxone >> 9/8 9/8 ampicillin >> 9/10 Zosyn 9/10>>>  SIGNIFICANT EVENTS: 8/30 Admit 9/04  Surgery/ tx to ICU for hypotension 9/10: GI, surgical service and IR consulted  LINES/TUBES: 9/4 Left Radial Aline >> 9/4 R TL IJ CVC >>  9/4 ETT >> 9/6  DISCUSSION: 83 yo with fall and hip fx.  Developed shock and VDRF after surgery from E coli UTI with sepsis and blood loss after surgery. -clinically continues to decline: WBC increasing, fever spike, tBilli still climbing, less responsive, still on pressors. Has + murphy's sign, I think this is acute acalculous cholecystitis. No other good explanation for his decline. Only thing to offer at this point would be perc drain. I have discussed this w/ family. Also spoke w/ IR team. Time and abx has shown [place] a course of cont. Decline. Will speak to family again but at this point choices are empiric perc, abx and time OR cont what we are doing but likely looking at comfort in near future.    ASSESSMENT / PLAN:  Septic shock initially from E coli UTI; now presumptive acute cholecystitis  -fever spite > 100 last night Wbc rising Still on levo Plan abx day 8; zosyn day 2 Perc drain per IR if family agrees Cont to wean levophed   Elevated LFTs (cont to worsen)-->likely multifactorial  Elevated ammonia. Jaundice -->bili cont to climb Probable acute acalculous colicystitis  -abd [place] w/ GB sludge and diffuse thickening;  + murphy's sign  -> Plan For empiric perc drain if family agrees Cont to trend LFTs Culture drainage if gets perc drain Cont abx (see above) Cont lactulose  Holding Lipitor   Acute hypoxic respiratory failure. Hx of asbestos related pleural plaques. -extubated 8/6 Plan Wean oxygen  Asp  precautions Pulse ox monitoring   Hx of CAD s/p DES 02/2016, chronic combined CHF, HLD, PAF. Plan Brillinta stopped again 9/10 Getting ASA  Acute renal failure >> improved/resolved Hx of BPH with urine retention. Hypernatremia,  and hyperchloremia -->worse in spite of water replacement; now w/ def of ~3.9 liters Plan Increase D5W to 80 ml/hr Repeat am chem Cont I&O  Dysphagia. Moderate protein calorie malnutrition. Plan NPO for perc drain  ABLA. Thrombocytopenia. -stable Plan Trending CBC SCDs Holding brillenta (stopped again 9/10_  Acute metabolic encephalopathy. -likely multifactorial  Plan Cont lactulose  Cont supportive care Careful w/ sedation   Hyperglycemia Plan ssi  Lt intertrochantric intramedullary nailing 12/08/16. Plan Cont care per ortho  DVT prophylaxis -  SCD SUP - Not indicated Nutrition - tube feeds Goals of care - full code  Updated pt's family at bedside My cct 50 min   Simonne Martinet ACNP-BC [facility] Pulmonary/Critical Care Pager # [phone] OR # [phone] if no answer

## 2016-12-15 NOTE — Progress Notes (Signed)
CRITICAL VALUE ALERT  Critical Value:  Bilirubin 19.6  Date & Time Notied: 9.11.18 0623  Provider Notified: Pola Corn  Orders Received/Actions taken: None at this time

## 2016-12-15 NOTE — Sedation Documentation (Signed)
Patient is resting comfortably. 

## 2016-12-15 NOTE — Progress Notes (Signed)
7 Days Post-Op   Subjective/Chief Complaint: Pt somnolent  Son at bedside    Objective: Vital signs in last 24 hours: Temp:  [97.9 F (36.6 C)-100.4 F (38 C)] 99.9 F (37.7 C) (09/11 0722) Pulse Rate:  [80-96] 92 (09/11 0741) Resp:  [24-35] 26 (09/11 0741) BP: (92-113)/(57-69) 102/60 (09/11 0741) SpO2:  [94 %-98 %] 97 % (09/11 0741) Arterial Line BP: (90-127)/(39-56) 114/51 (09/11 0630) FiO2 (%):  [28 %] 28 % (09/11 0741) Weight:  [99.3 kg (218 lb 14.7 oz)] 99.3 kg (218 lb 14.7 oz) (09/11 0500) Last BM Date: 12/14/16  Intake/Output from previous day: 09/10 0701 - 09/11 0700 In: 1358.9 [I.V.:1048.7; NG/GT:260.2; IV Piggyback:50] Out: 1775 [Urine:1775] Intake/Output this shift: No intake/output data recorded.  GI: soft no mass questionable RUQ tenderness but MS depressed   Lab Results:   Recent Labs  12/14/16 0318 12/15/16 0521  WBC 16.7* 21.9*  HGB 8.6* 9.7*  HCT 27.1* 30.4*  PLT 118* 182   BMET  Recent Labs  12/14/16 0318 12/15/16 0521  NA 148* 151*  K 3.3* 4.0  CL 115* 117*  CO2 26 27  GLUCOSE 160* 116*  BUN 73* 60*  CREATININE 0.88 0.92  CALCIUM 7.9* 7.7*   PT/INR No results for input(s): LABPROT, INR in the last 72 hours. ABG No results for input(s): PHART, HCO3 in the last 72 hours.  Invalid input(s): PCO2, PO2  Studies/Results: Dg Chest Port 1 View  Result Date: 12/15/2016 CLINICAL DATA:  Prior history of respiratory failure. Asbestosis. Coronary artery disease. EXAM: PORTABLE CHEST 1 VIEW COMPARISON:  12/10/2016. FINDINGS: Interim extubation. Interim removal of NG tube and placement of feeding tube. Right IJ line stable position. Heart size normal. Chronic bilateral from interstitial prominence with bilateral calcified pleural plaques are noted consistent with history of asbestosis. No new pulmonary findings. IMPRESSION: For 1 interim extubation. Interim removal of NG tube replacement feeding tube. Right IJ line stable position. 2. Bilateral  pulmonary and pleural changes of asbestosis again noted without interim change. No acute pulmonary abnormality identified. Electronically Signed   By: Maisie Fus  Register   On: 12/15/2016 07:11    Anti-infectives: Anti-infectives    Start     Dose/Rate Route Frequency Ordered Stop   12/14/16 1215  piperacillin-tazobactam (ZOSYN) IVPB 3.375 g     3.375 g 12.5 mL/hr over 240 Minutes Intravenous Every 8 hours 12/14/16 1149     12/12/16 1200  ampicillin (OMNIPEN) 1 g in sodium chloride 0.9 % 50 mL IVPB  Status:  Discontinued     1 g 150 mL/hr over 20 Minutes Intravenous Every 6 hours 12/12/16 1031 12/14/16 1127   12/09/16 0100  ceFAZolin (ANCEF) IVPB 1 g/50 mL premix     1 g 100 mL/hr over 30 Minutes Intravenous Every 6 hours 12/08/16 2359 12/09/16 1557   12/08/16 2300  cefTRIAXone (ROCEPHIN) 1 g in dextrose 5 % 50 mL IVPB  Status:  Discontinued     1 g 100 mL/hr over 30 Minutes Intravenous Every 24 hours 12/08/16 2231 12/12/16 1002   12/04/16 0600  ceFAZolin (ANCEF) IVPB 2g/100 mL premix     2 g 200 mL/hr over 30 Minutes Intravenous On call to O.R. 12/03/16 1641 12/05/16 0559      Assessment/Plan: HEPATIC FAILURE  GALLBLADDER SLUDGE  UNCLEAR IF DRAIN FOR GB HELPFUL AT THIS POINT Pt with progressive liver failure   Hopefully this will recover   IR to evaluate gallbladder drainage   Not an operative candidate   Will  follow    LOS: 12 days    Danny Oneill A. 12/15/2016

## 2016-12-15 NOTE — Care Management Note (Addendum)
Case Management Note  Patient Details  Name: Danny Oneill MRN: [id number] Date of Birth: July 23, 1931  Subjective/Objective:              Patient from home w wife, admitted after falling at restaurant. Ortho consult states Dr. Aundria Rud will do a Left Hip nailing 8/31. Pt eval after sx, mat need SNF. CM and CSW will continue to follow.      Action/Plan:   Expected Discharge Date:                  Expected Discharge Plan:     In-House Referral:  Clinical Social Work  Discharge planning Services  CM Consult  Post Acute Care Choice:    Choice offered to:     DME Arranged:    DME Agency:     HH Arranged:    HH Agency:     Status of Service:  In process, will continue to follow  If discussed at Long Length of Stay Meetings, dates discussed:    Additional Comments: 12/17/2016  Discussed in LOS 12/17/16 - pt remains appropriate for continued stay.  Pt remains on norepi drip, has Gallbladder tube, on tube feeds and IV antibiotics   12/15/2016  Discussed in LOS 9/11 - pt remains appropriate for continued stay.  Pt remains on vasopressors, liver functioning worsening, septic shock in need of perc drain. CM will continue to follow for discharge needs  Cherylann Parr, RN 12/15/2016, 3:42 PM

## 2016-12-15 NOTE — Procedures (Signed)
Interventional Radiology Procedure Note  Procedure:  Percutaneous cholecystostomy. To gravity.  Complications: None Recommendations:  - Culture to lab - Routine drain care - Do not submerge  - Routine wound care   Signed,  Yvone Neu. Loreta Ave, DO

## 2016-12-15 NOTE — Progress Notes (Addendum)
   Subjective:  The patient just returned from a percutaneous cholecystostomy tube. He is currently somnolent and not arousable. He is on nasal cannula. After discussion with the ICU nurse he is still requiring norepinephrine for blood pressure support but his maps are easily above 65 and she is weaning that currently. She confirms that he has continued to be fairly somnolent over the last 3 days. She denies any issues with his left hip incisions or bandages. He is getting aspirin at this time and they are holding the Brillinta that given his anemia.  Objective:   VITALS:   Vitals:   12/15/16 1400 12/15/16 1500 12/15/16 1544 12/15/16 1617  BP: 98/67 103/67 103/67 115/67  Pulse: 86 83 85 86  Resp: (!) 27 (!) 22 (!) 24 (!) 28  Temp:      TempSrc:      SpO2: 99% 99% 99% 100%  Weight:      Height:        Left leg exam:  Incision about the hip on the lateral aspect are clean dry and intact. There is no drainage at this time. Thigh is soft and nontender. Skin is warm and well-perfused. He is unable to provide a focal examination but does spontaneously withdrawal to pain throughout the left leg. As a 1+ dorsalis pedis pulse. His calf is soft.  He does respond to passive stretch and a painful manner. Lab Results  Component Value Date   WBC 21.9 (H) 12/15/2016   HGB 9.7 (L) 12/15/2016   HCT 30.4 (L) 12/15/2016   MCV 94.1 12/15/2016   PLT 182 12/15/2016   BMET    Component Value Date/Time   NA 151 (H) 12/15/2016 0521   NA 132 (A) 02/17/2016   K 4.0 12/15/2016 0521   CL 117 (H) 12/15/2016 0521   CO2 27 12/15/2016 0521   GLUCOSE 116 (H) 12/15/2016 0521   BUN 60 (H) 12/15/2016 0521   BUN 37 (A) 02/17/2016   CREATININE 0.92 12/15/2016 0521   CREATININE 1.25 (H) 03/16/2016 1229   CALCIUM 7.7 (L) 12/15/2016 0521   GFRNONAA >60 12/15/2016 0521   GFRAA >60 12/15/2016 0521     Assessment/Plan: 7 Days Post-Op   Principal Problem:   Closed left hip fracture (HCC) Active Problems:  Hyperlipidemia   CAD (coronary artery disease)   CKD (chronic kidney disease), stage III   Hip fracture (HCC)   Compromised airway   Hypovolemic shock (HCC)   Elevated troponin I level   Chronic combined systolic and diastolic heart failure (HCC)   - Continue critical care per the ICU.  - Aspirin and SCDs were DVT prophylaxis.  - He is appropriate for 50% weightbearing to the left lower extremity once cleared from a critical-care standpoint.   Yolonda Kida 12/15/2016, 4:28 PM   Maryan Rued, MD [phone]

## 2016-12-15 NOTE — Consult Note (Signed)
Chief Complaint: Patient was seen in consultation today for percutaneous cholecystostomy drain placement Chief Complaint  Patient presents with  . Fall  . Hip Pain   at the request of CCM  Referring Physician(s): Dr Belva Crome  Supervising Physician: Gilmer Mor  Patient Status: [facility] - In-pt  History of Present Illness: Danny Oneill is a 83 y.o. male   Admitted end of August from home with hip fracture Hip repair/replacement 9/4 Anticoagulation Brlinta was stopped prior to surgery (CAD; AI) Post op--pt developed complication-- Hypoxia; sepsis Now noted leukocytosis; fever RUQ pain Increased liver functions: jaundice [place] 9/8: Sludge in the gallbladder. Slight gallbladder wall thickening, TB 19.6 today Request made for IR to consult for possible GB drain placement Brilinta restarted  Last dose 9/10 am  Dr Loreta Ave has reviewed imaging and chart He has discussed with Anders Simmonds NP Will plan for procedure asap   Past Medical History:  Diagnosis Date  . Anemia   . Aortic insufficiency 03/09/2016   a. mild-mod AI by echo 03/04/16.  . Arthritis   . Asbestosis (HCC)    "no breathing problems" pt states worked in [employer] for many yrs and was exposed to asbestosis  . Bladder outlet obstruction 03/09/2016  . CAD (coronary artery disease)    a. 02/2016: inf lat STEMI with occluded OM2 which was treated with DES; otherwise he had moderate 50% oRCA, 40% oLM, 70% D1, 10% mLAD, 80% lateral 2nd marg which was treated medically.  . Cardiomyopathy, ischemic   . Chronic combined systolic and diastolic CHF (congestive heart failure) (HCC)    a. EF 30-35% at time of STEMI 02/2016, improved to 40-45% by echo 11/20 (but with pericardial effusion). b. Further improved EF to 55-60% 03/04/16.  Marland Kitchen CKD (chronic kidney disease), stage III   . Coronary artery disease   . Dyspnea   . First degree AV block   . Hip fracture (HCC) 11/2016  . Hyperlipidemia   . Hyponatremia   . Lumbar  spondylosis   . Myocardial infarction (HCC)   . Orthostatic hypotension   . PAF (paroxysmal atrial fibrillation) (HCC)    a. dx 02/2016: not anticoagulated due to pericardial effusion; amiodarone stopped due to suspected allergic drug rash.  . Pericardial effusion    a. 02/2016 - followed clinically (mod-large at first then small-mod in f/u echo).    Past Surgical History:  Procedure Laterality Date  . CARDIAC CATHETERIZATION N/A 02/02/2016   Procedure: Left Heart Cath and Coronary Angiography;  Surgeon: Corky Crafts, MD;  Location: [name] INVASIVE CV LAB;  Service: Cardiovascular;  Laterality: N/A;  . CARDIAC CATHETERIZATION N/A 02/02/2016   Procedure: Coronary Stent Intervention;  Surgeon: Corky Crafts, MD;  Location: [name] INVASIVE CV LAB;  Service: Cardiovascular;  Laterality: N/A;  . CARDIAC CATHETERIZATION N/A 02/02/2016   Procedure: IABP Insertion;  Surgeon: Corky Crafts, MD;  Location: [facility] INVASIVE CV LAB;  Service: Cardiovascular;  Laterality: N/A;  . CATARACTS REMOVED     BIL  . CORONARY STENT PLACEMENT  01/2016  . INTRAMEDULLARY (IM) NAIL INTERTROCHANTERIC Left 12/08/2016   Procedure: INTRAMEDULLARY (IM) NAIL INTERTROCHANTRIC;  Surgeon: Yolonda Kida, MD;  Location: [facility] OR;  Service: Orthopedics;  Laterality: Left;  . ROTATOR CUFF REPAIR  2011   L SHOULDER  . TOTAL KNEE ARTHROPLASTY Right 10/03/2013   Procedure: RIGHT TOTAL KNEE ARTHROPLASTY;  Surgeon: [name], MD;  Location: [facility] ORS;  Service: Orthopedics;  Laterality: Right;    Allergies: Amiodarone and Morphine  and related  Medications: Prior to Admission medications   Medication Sig Start Date End Date Taking? Authorizing Provider  aspirin EC 81 MG tablet Take 81 mg by mouth daily with breakfast.    Yes [provider]  atorvastatin (LIPITOR) 20 MG tablet Take 1 tablet (20 mg total) by mouth daily. 04/07/16  Yes Lennette Bihari, MD  carvedilol (COREG) 3.125 MG tablet Take 3.125 mg by  mouth 2 (two) times daily. 11/18/16  Yes [provider]  furosemide (LASIX) 20 MG tablet Take 1 tablet by mouth daily and ok to use extra tablet as needed for swelling. 10/14/16  Yes Cardama, Amadeo Garnet, MD  lisinopril (PRINIVIL,ZESTRIL) 2.5 MG tablet Take 1 tablet (2.5 mg total) by mouth daily. 09/21/16 12/20/16 Yes Lennette Bihari, MD  tamsulosin (FLOMAX) 0.4 MG CAPS capsule TAKE 1 CAPSULE(0.4 MG) BY MOUTH DAILY AFTER SUPPER 04/01/16  Yes Dunn, Tacey Ruiz, PA-C  ticagrelor (BRILINTA) 90 MG TABS tablet Take 1 tablet (90 mg total) by mouth 2 (two) times daily. 04/07/16  Yes Lennette Bihari, MD  carvedilol (COREG) 6.25 MG tablet Take 1 tablet (6.25 mg total) by mouth 2 (two) times daily. Patient not taking: Reported on 12/03/2016 04/28/16   Lennette Bihari, MD     Family History  Problem Relation Age of Onset  . Cancer Mother     Social History   Social History  . Marital status: Married    Spouse name: N/A  . Number of children: N/A  . Years of education: N/A   Social History Main Topics  . Smoking status: Never Smoker  . Smokeless tobacco: Never Used  . Alcohol use No  . Drug use: No  . Sexual activity: Not Asked   Other Topics Concern  . None   Social History Narrative  . None    Review of Systems: A 12 point ROS discussed and pertinent positives are indicated in the HPI above.  All other systems are negative.  Review of Systems  Constitutional: Positive for activity change, appetite change and fever.  Gastrointestinal: Positive for abdominal pain.  Neurological: Positive for weakness.    Vital Signs: BP 102/60   Pulse 92   Temp 99.9 F (37.7 C) (Axillary)   Resp (!) 26   Ht  (1.854 m)   Wt 218 lb 14.7 oz (99.3 kg)   SpO2 97%   BMI 28.88 kg/m   Physical Exam  Constitutional:  Ill appearing male No response  Cardiovascular: Normal rate.   No murmur heard. Pulmonary/Chest: Effort normal. He has wheezes.  Abdominal: Soft. Bowel sounds are normal.    Skin: Skin is warm.  Psychiatric:  Consented with son at bedside  Nursing note and vitals reviewed.   Mallampati Score:  MD Evaluation Airway: WNL Heart: WNL Abdomen: WNL Chest/ Lungs: WNL ASA  Classification: 3 Mallampati/Airway Score: Two  Imaging: Dg Chest 1 View  Result Date: 12/03/2016 CLINICAL DATA:  83 year old male status post fall. EXAM: CHEST 1 VIEW COMPARISON:  CT 07/08/2016 FINDINGS: The heart size and mediastinal contours are enlarged. There is bibasilar atelectasis without focal opacity, sizable effusion or pneumothorax. The visualized skeletal structures are unremarkable. IMPRESSION: Cardiomegaly and bibasilar atelectasis without focal opacity. Electronically Signed   By: Sande Brothers M.D.   On: 12/03/2016 15:35   Dg Chest Port 1 View  Result Date: 12/15/2016 CLINICAL DATA:  Prior history of respiratory failure. Asbestosis. Coronary artery disease. EXAM: PORTABLE CHEST 1 VIEW COMPARISON:  12/10/2016. FINDINGS: Interim extubation.  Interim removal of NG tube and placement of feeding tube. Right IJ line stable position. Heart size normal. Chronic bilateral from interstitial prominence with bilateral calcified pleural plaques are noted consistent with history of asbestosis. No new pulmonary findings. IMPRESSION: For 1 interim extubation. Interim removal of NG tube replacement feeding tube. Right IJ line stable position. 2. Bilateral pulmonary and pleural changes of asbestosis again noted without interim change. No acute pulmonary abnormality identified. Electronically Signed   By: Maisie Fus  Register   On: 12/15/2016 07:11   Dg Chest Port 1 View  Result Date: 12/10/2016 CLINICAL DATA:  Respiratory failure, asbestosis, coronary artery disease. EXAM: PORTABLE CHEST 1 VIEW COMPARISON:  Portable chest x-ray of December 08, 2016 FINDINGS: The lungs are adequately inflated. The interstitial markings remain coarse. Calcified pleural plaques are present bilaterally. There is no alveolar  infiltrate. There is no significant pleural effusion and no pneumothorax. The heart is mildly enlarged. The pulmonary vascularity is not engorged. The endotracheal tube tip projects 5.3 above the carina. The esophagogastric tube tip and proximal port project below the inferior margin of the image. The right internal jugular venous catheter tip projects over the midportion of the SVC. There is calcification in the wall of the thoracic aorta. IMPRESSION: Interval advancement of the esophagogastric tube since at both the tip and proximal port project below the expected location of the GE junction. Stable pleuro parenchymal changes consistent with asbestosis. No acute pneumonia nor pulmonary edema. Mild cardiomegaly without pulmonary vascular congestion. Thoracic aortic atherosclerosis. Electronically Signed   By: David  [place] M.D.   On: 12/10/2016 07:11   Portable Chest Xray  Result Date: 12/08/2016 CLINICAL DATA:  Hypoxia EXAM: PORTABLE CHEST 1 VIEW COMPARISON:  December 03, 2016 FINDINGS: Endotracheal tube tip is 5.1 cm above the carina. Orogastric tube tip is at the gastroesophageal junction with the side port above the gastroesophageal junction in the distal esophagus. Central catheter tip is in superior vena cava. No pneumothorax. There are multiple foci of pleural calcification as well as calcification along each hemidiaphragm. There is mild scarring in each mid lung. There is no edema or consolidation. Heart is upper normal in size with pulmonary vascularity within normal limits. There is aortic atherosclerosis. No adenopathy. No focal bone lesions. IMPRESSION: Tube and catheter positions as described without pneumothorax. Note that the orogastric tube side port is above the gastroesophageal junction. Advise advancing orogastric tube approximately 10 cm to insure that tube tip and side port are within the stomach. Areas of previous asbestos exposure. Areas of mild scarring. No edema or consolidation. Heart  upper normal in size.  Areas of aortic atherosclerosis. Aortic Atherosclerosis (ICD10-I70.0). Electronically Signed   By: Bretta Bang III M.D.   On: 12/08/2016 21:35   Dg C-arm 1-60 Min  Result Date: 12/08/2016 CLINICAL DATA:  Left hip IM nail EXAM: DG C-ARM 61-120 MIN; OPERATIVE LEFT HIP WITH PELVIS COMPARISON:  12/03/2016 FINDINGS: Four low resolution intraoperative spot views of the left hip. Total fluoroscopy time was 55 seconds. Images demonstrate intramedullary rod and screw fixation of the proximal left femur across a trochanteric fracture. IMPRESSION: Intraoperative fluoroscopic assistance provided during surgical fixation of left femoral fracture Electronically Signed   By: Jasmine Pang M.D.   On: 12/08/2016 16:59   Dg Hip Operative Unilat W Or W/o Pelvis Left  Result Date: 12/08/2016 CLINICAL DATA:  Left hip IM nail EXAM: DG C-ARM 61-120 MIN; OPERATIVE LEFT HIP WITH PELVIS COMPARISON:  12/03/2016 FINDINGS: Four low resolution intraoperative spot views of  the left hip. Total fluoroscopy time was 55 seconds. Images demonstrate intramedullary rod and screw fixation of the proximal left femur across a trochanteric fracture. IMPRESSION: Intraoperative fluoroscopic assistance provided during surgical fixation of left femoral fracture Electronically Signed   By: Jasmine Pang M.D.   On: 12/08/2016 16:59   Dg Hip Unilat With Pelvis 2-3 Views Left  Result Date: 12/03/2016 CLINICAL DATA:  83 year old male with left hip pain status post fall. EXAM: DG HIP (WITH OR WITHOUT PELVIS) 2-3V LEFT COMPARISON:  None. FINDINGS: There is a comminuted subtrochanteric hip fracture on the left with associated angulation and displacement of the lesser trochanter. The right hip is intact. Visualized bony pelvis is intact. Soft tissues grossly unremarkable. IMPRESSION: Comminuted subtrochanteric hip fracture with angulation and displacement of multiple fracture fragments. Electronically Signed   By: Sande Brothers  M.D.   On: 12/03/2016 15:34   US Abdomen Limited Ruq  Result Date: 12/14/2016 CLINICAL DATA:  Abnormal liver enzymes. EXAM: ULTRASOUND ABDOMEN LIMITED RIGHT UPPER QUADRANT COMPARISON:  None. FINDINGS: Gallbladder: No gallstones. There is slight sludge in the gallbladder and there is slight diffuse thickening of the gallbladder wall. Negative sonographic Murphy's sign. Common bile duct: Diameter: 2.3 mm, normal. Liver: No focal lesion identified. Within normal limits in parenchymal echogenicity. Portal vein is patent on color Doppler imaging with normal direction of blood flow towards the liver. IMPRESSION: 1. Sludge in the gallbladder. Slight gallbladder wall thickening, nonspecific. 2. Normal appearing liver parenchyma. Electronically Signed   By: Francene Boyers M.D.   On: 12/14/2016 08:16    Labs:  CBC:  Recent Labs  12/12/16 0315 12/13/16 0335 12/14/16 0318 12/15/16 0521  WBC 15.9* 14.9* 16.7* 21.9*  HGB 8.2* 8.0* 8.6* 9.7*  HCT 25.7* 24.8* 27.1* 30.4*  PLT 88* 99* 118* 182    COAGS:  Recent Labs  02/02/16 2100 12/08/16 2301  INR 1.29 2.34  APTT 81*  --     BMP:  Recent Labs  12/12/16 1732 12/13/16 0335 12/14/16 0318 12/15/16 0521  NA 142 145 148* 151*  K 3.5 3.3* 3.3* 4.0  CL 107 111 115* 117*  CO2 GLUCOSE 171* 188* 160* 116*  BUN 71* 74* 73* 60*  CALCIUM 7.9* 7.8* 7.9* 7.7*  CREATININE 1.20 1.04 0.88 0.92  GFRNONAA 54* >60 >60 >60  GFRAA >60 >60 >60 >60    LIVER FUNCTION TESTS:  Recent Labs  12/12/16 0315 12/13/16 0335 12/14/16 0318 12/15/16 0521  BILITOT 11.5* 14.6* 17.8* 19.6*  AST 102* 88* 123* 159*  ALT 51 50 66* 89*  ALKPHOS 77 107 99 110  PROT 5.0* 5.2* 5.0* 4.9*  ALBUMIN 2.2* 1.9* 1.9* 1.8*    TUMOR MARKERS: No results for input(s): AFPTM, CEA, CA199, CHROMGRNA in the last 8760 hours.  Assessment and Plan:  Hip fracture Post op complications Hypoxia; sepsis Jaundice; RUQ pain Suspected cholestasis Scheduled now  for percutaneous cholecystostomy drain placement Risks and benefits discussed with the patient's son including, but not limited to bleeding, infection, gallbladder perforation, bile leak, sepsis or even death. All of the patient's sons questions were answered, he is agreeable to proceed. Consent signed and in chart.   Thank you for this interesting consult.  I greatly enjoyed meeting Tejon Gracie and look forward to participating in their care.  A copy of this report was sent to the requesting provider on this date.  Electronically Signed: Robet Leu, PA-C 12/15/2016, 8:55 AM   I spent a total of 40  Minutes    in face to face in clinical consultation, greater than 50% of which was counseling/coordinating care for perc chole drain

## 2016-12-16 DIAGNOSIS — J9600 Acute respiratory failure, unspecified whether with hypoxia or hypercapnia: Secondary | ICD-10-CM

## 2016-12-16 LAB — BASIC METABOLIC PANEL
Anion gap: 6 (ref 5–15)
BUN: 60 mg/dL — ABNORMAL HIGH (ref 6–20)
CALCIUM: 7.7 mg/dL — AB (ref 8.9–10.3)
CHLORIDE: 120 mmol/L — AB (ref 101–111)
CO2: 26 mmol/L (ref 22–32)
CREATININE: 0.95 mg/dL (ref 0.61–1.24)
GFR calc non Af Amer: >60 mL/min (ref >60)
Glucose, Bld: 112 mg/dL — ABNORMAL HIGH (ref 65–99)
Potassium: 4.1 mmol/L (ref 3.5–5.1)
SODIUM: 152 mmol/L — AB (ref 135–145)

## 2016-12-16 LAB — CBC
HCT: 31 % — ABNORMAL LOW (ref 39.0–52.0)
Hemoglobin: 9.8 g/dL — ABNORMAL LOW (ref 13.0–17.0)
MCH: 30.2 pg (ref 26.0–34.0)
MCHC: 31.6 g/dL (ref 30.0–36.0)
MCV: 95.4 fL (ref 78.0–100.0)
PLATELETS: 205 10*3/uL (ref 150–400)
RBC: 3.25 MIL/uL — AB (ref 4.22–5.81)
RDW: 20.9 % — ABNORMAL HIGH (ref 11.5–15.5)
WBC: 23 10*3/uL — AB (ref 4.0–10.5)

## 2016-12-16 LAB — GLUCOSE, CAPILLARY
GLUCOSE-CAPILLARY: 107 mg/dL — AB (ref 65–99)
GLUCOSE-CAPILLARY: 129 mg/dL — AB (ref 65–99)
Glucose-Capillary: 111 mg/dL — ABNORMAL HIGH (ref 65–99)
Glucose-Capillary: 116 mg/dL — ABNORMAL HIGH (ref 65–99)
Glucose-Capillary: 154 mg/dL — ABNORMAL HIGH (ref 65–99)
Glucose-Capillary: 165 mg/dL — ABNORMAL HIGH (ref 65–99)

## 2016-12-16 LAB — C DIFFICILE QUICK SCREEN W PCR REFLEX
C DIFFICILE (CDIFF) TOXIN: NEGATIVE
C DIFFICLE (CDIFF) ANTIGEN: POSITIVE — AB

## 2016-12-16 LAB — CLOSTRIDIUM DIFFICILE BY PCR: Toxigenic C. Difficile by PCR: POSITIVE — AB

## 2016-12-16 MED ORDER — SODIUM CHLORIDE 0.9% FLUSH: 10.0000 mL | INTRAVENOUS | Status: DC | PRN

## 2016-12-16 MED ORDER — CHLORHEXIDINE GLUCONATE CLOTH 2 % EX PADS
6.0000 | MEDICATED_PAD | Freq: Every day | CUTANEOUS | Status: DC
  Administered 2016-12-16 – 2017-01-15 (×23): 6 via TOPICAL

## 2016-12-16 MED ORDER — FREE WATER
200.0000 mL | Freq: Four times a day (QID) | Status: DC
  Administered 2016-12-16 – 2016-12-21 (×19): 200 mL

## 2016-12-16 MED ORDER — DEXTROSE 5 % IV SOLN
2.0000 g | Freq: Three times a day (TID) | INTRAVENOUS | Status: DC
  Administered 2016-12-16 – 2016-12-21 (×15): 2 g via INTRAVENOUS
  Filled 2016-12-16 (×18): qty 2

## 2016-12-16 MED ORDER — VITAL AF 1.2 CAL PO LIQD
1000.0000 mL | ORAL | Status: DC
  Administered 2016-12-16 – 2016-12-31 (×19): 1000 mL
  Filled 2016-12-16 (×19): qty 1000

## 2016-12-16 MED ORDER — SODIUM CHLORIDE 0.9% FLUSH
10.0000 mL | Freq: Two times a day (BID) | INTRAVENOUS | Status: DC
  Administered 2016-12-16 – 2016-12-22 (×10): 10 mL
  Administered 2016-12-23: 20 mL
  Administered 2016-12-23: 10 mL
  Administered 2016-12-24: 3 mL

## 2016-12-16 MED ORDER — VANCOMYCIN 50 MG/ML ORAL SOLUTION
125.0000 mg | Freq: Four times a day (QID) | ORAL | Status: DC
  Administered 2016-12-16 – 2016-12-17 (×4): 125 mg via ORAL
  Filled 2016-12-16 (×5): qty 2.5

## 2016-12-16 NOTE — Progress Notes (Signed)
Referring Physician(s):  Dr. Isaiah Serge  Supervising Physician: Irish Lack  Patient Status:  [facility] - In-pt  Chief Complaint:  Suspected cholestasis, hyperbilirubinemia s/p percutaneous cholecystostomy 12/15/16  Subjective: Lethargic. Arousable but does not open eyes or communicate.    Allergies: Amiodarone and Morphine and related  Medications: Prior to Admission medications   Medication Sig Start Date End Date Taking? Authorizing Provider  aspirin EC 81 MG tablet Take 81 mg by mouth daily with breakfast.    Yes [provider]  atorvastatin (LIPITOR) 20 MG tablet Take 1 tablet (20 mg total) by mouth daily. 04/07/16  Yes Lennette Bihari, MD  carvedilol (COREG) 3.125 MG tablet Take 3.125 mg by mouth 2 (two) times daily. 11/18/16  Yes [provider]  furosemide (LASIX) 20 MG tablet Take 1 tablet by mouth daily and ok to use extra tablet as needed for swelling. 10/14/16  Yes Cardama, Amadeo Garnet, MD  lisinopril (PRINIVIL,ZESTRIL) 2.5 MG tablet Take 1 tablet (2.5 mg total) by mouth daily. 09/21/16 12/20/16 Yes Lennette Bihari, MD  tamsulosin (FLOMAX) 0.4 MG CAPS capsule TAKE 1 CAPSULE(0.4 MG) BY MOUTH DAILY AFTER SUPPER 04/01/16  Yes Dunn, Tacey Ruiz, PA-C  ticagrelor (BRILINTA) 90 MG TABS tablet Take 1 tablet (90 mg total) by mouth 2 (two) times daily. 04/07/16  Yes Lennette Bihari, MD  carvedilol (COREG) 6.25 MG tablet Take 1 tablet (6.25 mg total) by mouth 2 (two) times daily. Patient not taking: Reported on 12/03/2016 04/28/16   Lennette Bihari, MD     Vital Signs: BP 97/66 (BP Location: Right Arm)   Pulse 79   Temp (!) 97.5 F (36.4 C) (Oral)   Resp (!) 21   Ht 6\' 1"  (1.854 m)   Wt 218 lb 7.6 oz (99.1 kg)   SpO2 100%   BMI 28.82 kg/m   Physical Exam  NAD, alert Abd:  Drain intact without erythema or warmth.  Dark bilious output.   Imaging: Ir Perc Cholecystostomy  Result Date: 12/15/2016 INDICATION: 83 year old male with a history of sepsis, jaundice,  concern for acute cholecystitis/acalculous cholecystitis EXAM: CHOLECYSTOSTOMY MEDICATIONS: None ANESTHESIA/SEDATION: Moderate (conscious) sedation was employed during this procedure. A total of Versed 1.0 mg and Fentanyl 50 mcg was administered intravenously. Moderate Sedation Time: 10 minutes. The patient's level of consciousness and vital signs were monitored continuously by radiology nursing throughout the procedure under my direct supervision. FLUOROSCOPY TIME:  Fluoroscopy Time: 0 minutes 42 seconds (4 mGy). COMPLICATIONS: None PROCEDURE: Informed written consent was obtained from the patient and the patient's family after a thorough discussion of the procedural risks, benefits and alternatives. All questions were addressed. Maximal Sterile Barrier Technique was utilized including caps, mask, sterile gowns, sterile gloves, sterile drape, hand hygiene and skin antiseptic. A timeout was performed prior to the initiation of the procedure. Ultrasound survey of the right upper quadrant was performed for planning purposes. Once the patient is prepped and draped in the usual sterile fashion, the skin and subcutaneous tissues overlying the gallbladder were generously infiltrated 1% lidocaine for local anesthesia. A coaxial needle was advanced under ultrasound guidance through the skin subcutaneous tissues and a small segment of liver into the gallbladder lumen. With removal of the stylet, spontaneous dark bile drainage occurred. Using modified Seldinger technique, a 10 French drain was placed into the gallbladder fossa, with aspiration of the sample for the lab. Contrast injection confirmed position of the tube within the gallbladder lumen. Drainage catheter was attached to gravity drain with a  suture retention placed. Patient tolerated the procedure well and remained hemodynamically stable throughout. No complications were encountered and no significant blood loss encountered. IMPRESSION: Status post image guided  percutaneous cholecystostomy. Signed, Yvone Neu. Loreta Ave, DO [facility] Electronically Signed   By: Gilmer Mor D.O.   On: 12/15/2016 17:03   Dg Chest Port 1 View  Result Date: 12/15/2016 CLINICAL DATA:  Prior history of respiratory failure. Asbestosis. Coronary artery disease. EXAM: PORTABLE CHEST 1 VIEW COMPARISON:  12/10/2016. FINDINGS: Interim extubation. Interim removal of NG tube and placement of feeding tube. Right IJ line stable position. Heart size normal. Chronic bilateral from interstitial prominence with bilateral calcified pleural plaques are noted consistent with history of asbestosis. No new pulmonary findings. IMPRESSION: For 1 interim extubation. Interim removal of NG tube replacement feeding tube. Right IJ line stable position. 2. Bilateral pulmonary and pleural changes of asbestosis again noted without interim change. No acute pulmonary abnormality identified. Electronically Signed   By: Maisie Fus  Register   On: 12/15/2016 07:11    Labs:  CBC:  Recent Labs  12/13/16 0335 12/14/16 0318 12/15/16 0521 12/16/16 0420  WBC 14.9* 16.7* 21.9* 23.0*  HGB 8.0* 8.6* 9.7* 9.8*  HCT 24.8* 27.1* 30.4* 31.0*  PLT 99* 118* 182 205    COAGS:  Recent Labs  02/02/16 2100 12/08/16 2301 12/15/16 0948  INR 1.29 2.34 1.68  APTT 81*  --  33    BMP:  Recent Labs  12/14/16 0318 12/15/16 0521 12/16/16 0420 12/16/16 0832  NA 148* 151* 152* 151*  K 3.3* 4.0 4.1 3.5  CL 115* 117* 120* 119*  CO2 GLUCOSE 160* 116* 112* 133*  BUN 73* 60* 60* 59*  CALCIUM 7.9* 7.7* 7.7* 7.6*  CREATININE 0.88 0.92 0.95 1.01  GFRNONAA >60 >60 >60 >60  GFRAA >60 >60 >60 >60    LIVER FUNCTION TESTS:  Recent Labs  12/13/16 0335 12/14/16 0318 12/15/16 0521 12/16/16 0832  BILITOT 14.6* 17.8* 19.6* 19.8*  AST 88* 123* 159* 121*  ALT 50 66* 89* 72*  ALKPHOS 107 99 110 95  PROT 5.2* 5.0* 4.9* 4.8*  ALBUMIN 1.9* 1.9* 1.8*  1.6*    Assessment and Plan: Hip fracture with post-op complications included sepsis and suspected cholestasis s/p percutaneous drain placement 12/15/16 by Dr. Loreta Ave Patient remains acutely ill.  TBili 19.8 today- unchanged from yesterday.  Perc chole in place with dark bilious output. IR to follow.   Electronically Signed: Hoyt Koch, PA 12/16/2016, 1:27 PM   I spent a total of 15 Minutes at the the patient's bedside AND on the patient's [facility] floor or unit, greater than 50% of which was counseling/coordinating care for hyperbilirubinemia.

## 2016-12-16 NOTE — Progress Notes (Signed)
8 Days Post-Op   Subjective/Chief Complaint: Pt in bed somnolent  Drain placed by IR yesterday    Objective: Vital signs in last 24 hours: Temp:  [97.4 F (36.3 C)-98.6 F (37 C)] 98.6 F (37 C) (09/12 0347) Pulse Rate:  [75-94] 79 (09/12 0600) Resp:  [20-31] 26 (09/12 0600) BP: (82-115)/(49-74) 91/53 (09/12 0600) SpO2:  [96 %-100 %] 100 % (09/12 0600) Arterial Line BP: (68-136)/(47-95) 106/51 (09/12 0600) FiO2 (%):  [28 %] 28 % (09/11 0741) Weight:  [99.1 kg (218 lb 7.6 oz)] 99.1 kg (218 lb 7.6 oz) (09/12 0426) Last BM Date: 12/15/16  Intake/Output from previous day: 09/11 0701 - 09/12 0700 In: 2140.2 [I.V.:1952.7; IV Piggyback:187.5] Out: 2015 [Urine:1950; Drains:15; Stool:50] Intake/Output this shift: No intake/output data recorded.  GI: drain RUQ in place   Mild RUQ tenderness   Lab Results:   Recent Labs  12/15/16 0521 12/16/16 0420  WBC 21.9* 23.0*  HGB 9.7* 9.8*  HCT 30.4* 31.0*  PLT 182 205   BMET  Recent Labs  12/15/16 0521 12/16/16 0420  NA 151* 152*  K 4.0 4.1  CL 117* 120*  CO2 27 26  GLUCOSE 116* 112*  BUN 60* 60*  CREATININE 0.92 0.95  CALCIUM 7.7* 7.7*   PT/INR  Recent Labs  12/15/16 0948  LABPROT 19.7*  INR 1.68   ABG No results for input(s): PHART, HCO3 in the last 72 hours.  Invalid input(s): PCO2, PO2  Studies/Results: Ir Perc Cholecystostomy  Result Date: 12/15/2016 INDICATION: 83 year old male with a history of sepsis, jaundice, concern for acute cholecystitis/acalculous cholecystitis EXAM: CHOLECYSTOSTOMY MEDICATIONS: None ANESTHESIA/SEDATION: Moderate (conscious) sedation was employed during this procedure. A total of Versed 1.0 mg and Fentanyl 50 mcg was administered intravenously. Moderate Sedation Time: 10 minutes. The patient's level of consciousness and vital signs were monitored continuously by radiology nursing throughout the procedure under my direct supervision. FLUOROSCOPY TIME:  Fluoroscopy Time: 0 minutes 42  seconds (4 mGy). COMPLICATIONS: None PROCEDURE: Informed written consent was obtained from the patient and the patient's family after a thorough discussion of the procedural risks, benefits and alternatives. All questions were addressed. Maximal Sterile Barrier Technique was utilized including caps, mask, sterile gowns, sterile gloves, sterile drape, hand hygiene and skin antiseptic. A timeout was performed prior to the initiation of the procedure. Ultrasound survey of the right upper quadrant was performed for planning purposes. Once the patient is prepped and draped in the usual sterile fashion, the skin and subcutaneous tissues overlying the gallbladder were generously infiltrated 1% lidocaine for local anesthesia. A coaxial needle was advanced under ultrasound guidance through the skin subcutaneous tissues and a small segment of liver into the gallbladder lumen. With removal of the stylet, spontaneous dark bile drainage occurred. Using modified Seldinger technique, a 10 French drain was placed into the gallbladder fossa, with aspiration of the sample for the lab. Contrast injection confirmed position of the tube within the gallbladder lumen. Drainage catheter was attached to gravity drain with a suture retention placed. Patient tolerated the procedure well and remained hemodynamically stable throughout. No complications were encountered and no significant blood loss encountered. IMPRESSION: Status post image guided percutaneous cholecystostomy. Signed, Yvone Neu. Loreta Ave, DO [facility] Radiology Electronically Signed   By: Gilmer Mor D.O.   On: 12/15/2016 17:03   Dg Chest Port 1 View  Result Date: 12/15/2016 CLINICAL DATA:  Prior history of respiratory failure. Asbestosis. Coronary artery disease. EXAM: PORTABLE CHEST 1 VIEW COMPARISON:  12/10/2016. FINDINGS:  Interim extubation. Interim removal of NG tube and placement of feeding tube. Right IJ line stable  position. Heart size normal. Chronic bilateral from interstitial prominence with bilateral calcified pleural plaques are noted consistent with history of asbestosis. No new pulmonary findings. IMPRESSION: For 1 interim extubation. Interim removal of NG tube replacement feeding tube. Right IJ line stable position. 2. Bilateral pulmonary and pleural changes of asbestosis again noted without interim change. No acute pulmonary abnormality identified. Electronically Signed   By: Maisie Fus  Register   On: 12/15/2016 07:11    Anti-infectives: Anti-infectives    Start     Dose/Rate Route Frequency Ordered Stop   12/14/16 1215  piperacillin-tazobactam (ZOSYN) IVPB 3.375 g     3.375 g 12.5 mL/hr over 240 Minutes Intravenous Every 8 hours 12/14/16 1149     12/12/16 1200  ampicillin (OMNIPEN) 1 g in sodium chloride 0.9 % 50 mL IVPB  Status:  Discontinued     1 g 150 mL/hr over 20 Minutes Intravenous Every 6 hours 12/12/16 1031 12/14/16 1127   12/09/16 0100  ceFAZolin (ANCEF) IVPB 1 g/50 mL premix     1 g 100 mL/hr over 30 Minutes Intravenous Every 6 hours 12/08/16 2359 12/09/16 1557   12/08/16 2300  cefTRIAXone (ROCEPHIN) 1 g in dextrose 5 % 50 mL IVPB  Status:  Discontinued     1 g 100 mL/hr over 30 Minutes Intravenous Every 24 hours 12/08/16 2231 12/12/16 1002   12/04/16 0600  ceFAZolin (ANCEF) IVPB 2g/100 mL premix     2 g 200 mL/hr over 30 Minutes Intravenous On call to O.R. 12/03/16 1641 12/05/16 0559      Assessment/Plan: Patient Active Problem List   Diagnosis Date Noted  . Elevated troponin I level 12/11/2016  . Chronic combined systolic and diastolic heart failure (HCC) 12/11/2016  . Compromised airway   . Hypovolemic shock (HCC)   . Closed left hip fracture (HCC) 12/03/2016  . Hip fracture (HCC) 12/03/2016  . Allergic drug rash 03/09/2016  . Orthostatic hypotension 03/09/2016  . Hyponatremia 03/09/2016  . Hypokalemia 03/09/2016  . Bladder outlet obstruction 03/09/2016  . Urinary  retention 03/09/2016  . First degree AV block 03/09/2016  . Aortic insufficiency 03/09/2016  . CKD (chronic kidney disease), stage III 03/09/2016  . Anemia   . Pericardial effusion 02/27/2016  . PAF (paroxysmal atrial fibrillation) (HCC) 02/27/2016  . CAD (coronary artery disease) 02/27/2016  . Cardiomyopathy, ischemic 02/27/2016  . Acute on chronic combined systolic and diastolic CHF (congestive heart failure) (HCC) 02/24/2016  . Systolic and diastolic CHF, acute (HCC) 02/13/2016  . Anterior and lateral ST segment elevation (HCC) 02/13/2016  . ARF (acute renal failure) (HCC) 02/13/2016  . Acute urinary retention 02/13/2016  . Hematuria 02/13/2016  . Elevated liver enzymes 02/13/2016  . Bilateral lower extremity edema 02/13/2016  . Low BP 02/13/2016  . Hyperlipidemia 02/13/2016  . Acute ST elevation myocardial infarction (STEMI) (HCC)   . Acute inferolateral myocardial infarction (HCC) 02/02/2016  . Acute MI, inferolateral wall, initial episode of care (HCC)   . Arterial hypotension   . Overweight (BMI 25.0-29.9) 10/04/2013  . Expected blood loss anemia 10/04/2013  . S/P right TKA 10/03/2013     S/P percutaneous cholecystostomy tube placement   Follow for now  Can TF once off pressors      LOS: 13 days    Maurita Havener A. 12/16/2016

## 2016-12-16 NOTE — Progress Notes (Signed)
PULMONARY / CRITICAL CARE MEDICINE   Name: Danny Oneill MRN: [id number] DOB: 02/05/1932    ADMISSION DATE:  12/03/2016 CONSULTATION DATE:  12/08/2016  REFERRING MD:  OR   CHIEF COMPLAINT:  Hypotension  HISTORY OF PRESENT ILLNESS:   83 yo male admitted with Lt hip comminuted fracture after fall at home.  Developed agitated delirium while in hospital.  Had Lt intertrochanteric nailing on 9/04 with EBL 200 ml.  Noted to have urine retention.  Post op had hypotension, progressive anemia, and required reintubation.  Found to have E coli UTI.   PMHx of CAD, systolic CHF with EF 45 to 50%, HLD, PAF, CKD, Asbestosis exposure, BPH.  SUBJECTIVE:  Got perc drain   VITAL SIGNS: BP (!) 91/53   Pulse 79   Temp 98.9 F (37.2 C) (Axillary)   Resp (!) 26   Ht 6\' 1"  (1.854 m)   Wt 218 lb 7.6 oz (99.1 kg)   SpO2 100%   BMI 28.82 kg/m   INTAKE / OUTPUT:  Intake/Output Summary (Last 24 hours) at 12/16/16 0840 Last data filed at 12/16/16 0600  Gross per 24 hour  Intake           2140.2 ml  Output             2015 ml  Net            125.2 ml    General appearance: acute on  Chronically ill appearing 83 Year old  Male,  Confused,only moans Eyes: sclerae icteric , moist conjunctivae; PERRL, EOMI bilaterally. Mouth:  Mucous membranes are dry but no mucosal ulcerationsNeck: Trachea midline; neck supple, no JVD Lungs/chest: CTA, with normal respiratory effort and no intercostal retractions CV: RRR, no MRGs  Abdomen: Soft, diffusely -tender; no masses Extremities: generalized anasarca Skin: Normal temperature,jaundice w/ scattered areas of ecchymosis Psych: profoundly weak, moans only, no focal def     LABS:  BMET  Recent Labs Lab 12/14/16 0318 12/15/16 0521 12/16/16 0420  NA 148* 151* 152*  K 3.3* 4.0 4.1  CL 115* 117* 120*  CO2 26 27 26   BUN 73* 60* 60*  CREATININE 0.88 0.92 0.95  GLUCOSE 160* 116* 112*    Electrolytes  Recent Labs Lab 12/12/16 1732 12/13/16 0335  12/13/16 1739 12/14/16 0318 12/15/16 0521 12/16/16 0420  CALCIUM 7.9* 7.8*  --  7.9* 7.7* 7.7*  MG 2.3 2.2 2.2  --   --   --   PHOS 2.5 1.6* 2.6  --   --   --     CBC  Recent Labs Lab 12/14/16 0318 12/15/16 0521 12/16/16 0420  WBC 16.7* 21.9* 23.0*  HGB 8.6* 9.7* 9.8*  HCT 27.1* 30.4* 31.0*  PLT 118* 182 205    Coag's  Recent Labs Lab 12/15/16 0948  APTT 33  INR 1.68    Sepsis Markers No results for input(s): LATICACIDVEN, PROCALCITON, O2SATVEN in the last 168 hours.  ABG  Recent Labs Lab 12/10/16 0302 12/12/16 0320  PHART 7.411 7.402  PCO2ART 35.3 37.1  PO2ART 125.0* 74.0*    Liver Enzymes  Recent Labs Lab 12/13/16 0335 12/14/16 0318 12/15/16 0521  AST 88* 123* 159*  ALT 50 66* 89*  ALKPHOS 107 99 110  BILITOT 14.6* 17.8* 19.6*  ALBUMIN 1.9* 1.9* 1.8*    Cardiac Enzymes  Recent Labs Lab 12/09/16 1139 12/10/16 1018  TROPONINI 0.07* 0.07*    Glucose  Recent Labs Lab 12/15/16 1209 12/15/16 1725 12/15/16 1954 12/15/16 2341 12/16/16 0343 12/16/16  0750  GLUCAP 103* 111* 102* 110* 107* 116*    Imaging Ir Perc Cholecystostomy  Result Date: 12/15/2016 INDICATION: 83 year old male with a history of sepsis, jaundice, concern for acute cholecystitis/acalculous cholecystitis EXAM: CHOLECYSTOSTOMY MEDICATIONS: None ANESTHESIA/SEDATION: Moderate (conscious) sedation was employed during this procedure. A total of Versed 1.0 mg and Fentanyl 50 mcg was administered intravenously. Moderate Sedation Time: 10 minutes. The patient's level of consciousness and vital signs were monitored continuously by radiology nursing throughout the procedure under my direct supervision. FLUOROSCOPY TIME:  Fluoroscopy Time: 0 minutes 42 seconds (4 mGy). COMPLICATIONS: None PROCEDURE: Informed written consent was obtained from the patient and the patient's family after a thorough discussion of the procedural risks, benefits and alternatives. All questions were  addressed. Maximal Sterile Barrier Technique was utilized including caps, mask, sterile gowns, sterile gloves, sterile drape, hand hygiene and skin antiseptic. A timeout was performed prior to the initiation of the procedure. Ultrasound survey of the right upper quadrant was performed for planning purposes. Once the patient is prepped and draped in the usual sterile fashion, the skin and subcutaneous tissues overlying the gallbladder were generously infiltrated 1% lidocaine for local anesthesia. A coaxial needle was advanced under ultrasound guidance through the skin subcutaneous tissues and a small segment of liver into the gallbladder lumen. With removal of the stylet, spontaneous dark bile drainage occurred. Using modified Seldinger technique, a 10 French drain was placed into the gallbladder fossa, with aspiration of the sample for the lab. Contrast injection confirmed position of the tube within the gallbladder lumen. Drainage catheter was attached to gravity drain with a suture retention placed. Patient tolerated the procedure well and remained hemodynamically stable throughout. No complications were encountered and no significant blood loss encountered. IMPRESSION: Status post image guided percutaneous cholecystostomy. Signed, Yvone Neu. Loreta Ave, DO [name] Radiology Electronically Signed   By: Gilmer Mor D.O.   On: 12/15/2016 17:03    STUDIES:  Abd u/s 9/08 >>No gallstones. There is slight sludge in the gallbladder and there is slight diffuse thickening of the gallbladder wall. Negative sonographic Murphy's sign.  CULTURES: 9/4 BC >> negative 9/4 UC >> E coli 9/11 blood culture>>>neg 9/12 perc drain >>>  ANTIBIOTICS: 9/4 ceftriaxone >> 9/8 9/8 ampicillin >> 9/10 Zosyn 9/10>>>  SIGNIFICANT EVENTS: 8/30 Admit 9/04  Surgery/ tx to ICU for hypotension 9/10: GI, surgical service and IR consulted  LINES/TUBES: 9/4 Left Radial Aline  >>9/12 9/4 R TL IJ CVC >>  9/4 ETT >> 9/6 Perc GB drain 9/12>>  DISCUSSION: 83 yo with fall and hip fx.  Developed shock and VDRF after surgery from E coli UTI with sepsis and blood loss after surgery. -clinically continued to decline: WBC increasing, fever spike, tBilli still climbing, less responsive, still on pressors. Has + murphy's sign, I felt like this is acute acalculous cholecystitis and we have since placed Perc drain. He is about the same. Am labs still pending.Marland Kitchen His WBC ct is a little worse. I do not think this is pulmonary source.  One thing we haven't considered is PCM.Marland Kitchen He's been on lactulose so diarrhea is likely coming from this HOWEVER he is at risk for PCM, he has a significantly high leukocytosis (often seen w/ PCM) and has diffuse abd pain today.  Plan Ck cdiff (as this is something we could treat) Cont free water replacement F/u cultures from perc drain Cont zosyn Cont supportive care   ASSESSMENT / PLAN:  Septic shock initially from E coli UTI;  now presumptive acute cholecystitis  Wbc ct cont to rise. No new fever Cultures pending from perc drain Plan Day 9 abx; zosyn day 3 F/u cultures from perc drain Wean levo for SBP 95 Ck Cdiff   Elevated LFTs (cont to worsen)-->likely multifactorial  Elevated ammonia. Jaundice -->bili cont to climb (pending as of am 9/12) Probable acute acalculous colicystitis  -abd [place] w/ GB sludge and diffuse thickening;  + murphy's sign  On-going diarrhea w/ abd pain AND persistent leukocytosis ->r/o PMC -> Plan Monitor perc drain output F/u LFTs F/u perc drain culture  Holding lipitor abx above  Holding lactulose-->ck ammonia in am  Checking cdiff   Acute hypoxic respiratory failure. Hx of asbestos related pleural plaques. -extubated 8/6 Plan Wean O2 Asp precautions Pulse ox  Hx of CAD s/p DES 02/2016, chronic combined CHF, HLD, PAF. Plan Resume Brillinta if LFTs improved Cont asa Cont tele   Acute renal failure  >> improved/resolved Hx of BPH with urine retention. Hypernatremia,  and hyperchloremia -->worse in spite of water replacement; now w/ def of ~4.3 liters. ? Due to diarrhea?? Plan Increase D5W to 90 ml/hr Cont I&O  Dysphagia. Moderate protein calorie malnutrition. Plan NPO Holding tubefeeds for now; Surg says hold till off pressors.    ABLA. Thrombocytopenia. -stable Plan Trend cbc PAS Resume Brillenta if LFTs improved   Acute metabolic encephalopathy. -likely multifactorial  Plan Holding lactulose for on-going diarrhea  Correct Na Hold sedating meds rx infection Supportive care  Hyperglycemia Plan ssi   Lt intertrochantric intramedullary nailing 12/08/16. Plan Per ortho  DVT prophylaxis - SCD SUP - Not indicated Nutrition - tube feeds Goals of care - full code  Updated pt's family at bedside My cct 40 min   Simonne Martinet ACNP-BC [name] Pulmonary/Critical Care Pager # [phone] OR # [phone] if no answer

## 2016-12-16 NOTE — Plan of Care (Addendum)
  Interdisciplinary Goals of Care Family Meeting   Date carried out:: 12/16/2016  Location of the meeting: Conference room  Member's involved: Physician, Bedside Registered Nurse and Family Member or next of kin  Durable Power of Attorney or acting medical decision maker: Spoke with [employer]. [employer] and [employer] participated over the phone    Discussion: We discussed goals of care for [employer] .  Mr Hersh has not made any improvement over the past week. They have been discussing with other family members who are physicians and feel that he needs to be transferred to a tertiary care center with hepatology service. They want to try [facility] first and then [facility]. I explained that they would not do anything more than we are doing right now and given his tenuous status we may need to intubate him for transport. They understand and want to give him the best chance of getting over this.   I have placed a call to [facility] patient logistics center and am awaiting a call back .   Code status: Full Code  Disposition: Continue current acute care  Time spent for the meeting: 15 mins  Dama Hedgepeth 12/16/2016, 4:31 PM  Addendum: Spoke with Dr. Dyke Brackett at [facility]. There are no beds available at [facility] right now for transfer. She suggested that we speak with the Hepatologist consult service to see if they can have any recommendations. I am waiting for the call back from the hepatologist.  [facility] who informed me that they do not have any ICU beds available. We will try again tomorrow.   Chilton Greathouse MD [place] Pulmonary and Critical Care Pager [phone] If no answer or after 3pm call: [phone] 12/16/2016, 5:17 PM

## 2016-12-16 NOTE — Progress Notes (Signed)
Pharmacy Antibiotic Note  Danny Oneill is a 83 y.o. male admitted on 12/03/2016 with sepsis/cholecystitis and gallbladder abscess.  Pharmacy has been consulted to change Zosyn to Ceftazidime dosing due to concern for Zosyn contributing to liver injury. Good UOP and SCR is stable.   Plan: Change to Ceftazidime 2g IV every 8 hours.  Consider if needs further anaerobic coverage.  Monitor renal function, culture results, and clinical status.   Height: 6\' 1"  (185.4 cm) Weight: 218 lb 7.6 oz (99.1 kg) IBW/kg (Calculated) : 79.9  Temp (24hrs), Avg:98.1 F (36.7 C), Min:97.4 F (36.3 C), Max:98.9 F (37.2 C)   Recent Labs Lab 12/12/16 0315  12/13/16 0335 12/14/16 0318 12/15/16 0521 12/16/16 0420 12/16/16 0832  WBC 15.9*  --  14.9* 16.7* 21.9* 23.0*  --   CREATININE 1.22  < > 1.04 0.88 0.92 0.95 1.01  < > = values in this interval not displayed.  Estimated Creatinine Clearance: 68.7 mL/min (by C-G formula based on SCr of 1.01 mg/dL).    Allergies  Allergen Reactions  . Amiodarone Rash  . Morphine And Related Other (See Comments)    Confusion/ hallucinations     Antimicrobials this admission: Ceftriaxone 9/4 >>9/8 Ampicillin 9/8 >>9/10 Zosyn 9/10 >> 9/12 Ceftazidime 9/12 >> PO Vancomycin 9/12 >>  Dose adjustments this admission:   Microbiology results: 9/12 Cdiff Positive 9/11 Bile >> 9/11 Gallbladder Abscess >> 9/4 Urine >> Ecoli (pan sensitive) 9/4 Blood x2 negative 8/30 MRSA PCR negative   Thank you for allowing pharmacy to be a part of this patient's care.  Link Snuffer, PharmD, BCPS Clinical Pharmacist Clinical phone 12/16/2016 until 11PM [phone] After hours, please call #[phone] 12/16/2016 5:35 PM

## 2016-12-17 ENCOUNTER — Encounter (HOSPITAL_COMMUNITY): Payer: Self-pay

## 2016-12-17 ENCOUNTER — Inpatient Hospital Stay (HOSPITAL_COMMUNITY): Payer: PPO

## 2016-12-17 DIAGNOSIS — J9600 Acute respiratory failure, unspecified whether with hypoxia or hypercapnia: Secondary | ICD-10-CM

## 2016-12-17 LAB — COMPREHENSIVE METABOLIC PANEL
ALBUMIN: 1.6 g/dL — AB (ref 3.5–5.0)
ALT: 72 U/L — ABNORMAL HIGH (ref 17–63)
ALT: 84 U/L — AB (ref 17–63)
ANION GAP: 5 (ref 5–15)
AST: 121 U/L — ABNORMAL HIGH (ref 15–41)
AST: 166 U/L — AB (ref 15–41)
Albumin: 1.5 g/dL — ABNORMAL LOW (ref 3.5–5.0)
Alkaline Phosphatase: 95 U/L (ref 38–126)
Alkaline Phosphatase: 91 U/L (ref 38–126)
Anion gap: 7 (ref 5–15)
BILIRUBIN TOTAL: 20.1 mg/dL — AB (ref 0.3–1.2)
BUN: 59 mg/dL — ABNORMAL HIGH (ref 6–20)
BUN: 67 mg/dL — AB (ref 6–20)
CALCIUM: 7.1 mg/dL — AB (ref 8.9–10.3)
CHLORIDE: 113 mmol/L — AB (ref 101–111)
CO2: 27 mmol/L (ref 22–32)
CO2: 28 mmol/L (ref 22–32)
CREATININE: 1.11 mg/dL (ref 0.61–1.24)
Calcium: 7.6 mg/dL — ABNORMAL LOW (ref 8.9–10.3)
Chloride: 119 mmol/L — ABNORMAL HIGH (ref 101–111)
Creatinine, Ser: 1.01 mg/dL (ref 0.61–1.24)
GFR calc Af Amer: >60 mL/min (ref >60)
GFR calc non Af Amer: >60 mL/min (ref >60)
GFR, EST NON AFRICAN AMERICAN: 59 mL/min — AB (ref >60)
GLUCOSE: 133 mg/dL — AB (ref 65–99)
Glucose, Bld: 158 mg/dL — ABNORMAL HIGH (ref 65–99)
POTASSIUM: 3.5 mmol/L (ref 3.5–5.1)
Potassium: 3.2 mmol/L — ABNORMAL LOW (ref 3.5–5.1)
SODIUM: 151 mmol/L — AB (ref 135–145)
Sodium: 148 mmol/L — ABNORMAL HIGH (ref 135–145)
TOTAL PROTEIN: 4.6 g/dL — AB (ref 6.5–8.1)
Total Bilirubin: 19.8 mg/dL (ref 0.3–1.2)
Total Protein: 4.8 g/dL — ABNORMAL LOW (ref 6.5–8.1)

## 2016-12-17 LAB — GLUCOSE, CAPILLARY
GLUCOSE-CAPILLARY: 149 mg/dL — AB (ref 65–99)
GLUCOSE-CAPILLARY: 168 mg/dL — AB (ref 65–99)
GLUCOSE-CAPILLARY: 150 mg/dL — AB (ref 65–99)
Glucose-Capillary: 145 mg/dL — ABNORMAL HIGH (ref 65–99)
Glucose-Capillary: 134 mg/dL — ABNORMAL HIGH (ref 65–99)

## 2016-12-17 LAB — CBC
HCT: 29.5 % — ABNORMAL LOW (ref 39.0–52.0)
Hemoglobin: 9.4 g/dL — ABNORMAL LOW (ref 13.0–17.0)
MCH: 30.6 pg (ref 26.0–34.0)
MCHC: 31.9 g/dL (ref 30.0–36.0)
MCV: 96.1 fL (ref 78.0–100.0)
PLATELETS: 205 10*3/uL (ref 150–400)
RBC: 3.07 MIL/uL — ABNORMAL LOW (ref 4.22–5.81)
RDW: 22.5 % — AB (ref 11.5–15.5)
WBC: 19.6 10*3/uL — AB (ref 4.0–10.5)

## 2016-12-17 LAB — PROTIME-INR
INR: 1.75
PROTHROMBIN TIME: 20.3 s — AB (ref 11.4–15.2)

## 2016-12-17 LAB — AMMONIA: AMMONIA: 24 umol/L (ref 9–35)

## 2016-12-17 MED ORDER — VANCOMYCIN 50 MG/ML ORAL SOLUTION
500.0000 mg | Freq: Four times a day (QID) | ORAL | Status: DC
  Administered 2016-12-17: 500 mg via ORAL
  Filled 2016-12-17 (×3): qty 10

## 2016-12-17 MED ORDER — POTASSIUM CHLORIDE 20 MEQ/15ML (10%) PO SOLN
40.0000 meq | Freq: Once | ORAL | Status: AC
  Administered 2016-12-17: 40 meq
  Filled 2016-12-17: qty 30

## 2016-12-17 MED ORDER — VANCOMYCIN 50 MG/ML ORAL SOLUTION
500.0000 mg | Freq: Four times a day (QID) | ORAL | Status: AC
  Administered 2016-12-18 – 2016-12-31 (×54): 500 mg
  Filled 2016-12-17 (×56): qty 10

## 2016-12-17 NOTE — Progress Notes (Signed)
   Subjective:  Continues to be somnolent.  Requiring pressors to maintain MAPS above 65.  Objective:   VITALS:   Vitals:   12/17/16 2000 12/17/16 2030 12/17/16 2054 12/17/16 2100  BP: 101/63 105/64  (!) 95/58  Pulse: 87 88  86  Resp: (!) 25 (!) 22  (!) 24  Temp:   98.6 F (37 C)   TempSrc:   Axillary   SpO2: 100% 100%  100%  Weight:      Height:        Left leg exam:  Incision about the hip on the lateral aspect are clean dry and intact. There is no drainage at this time. Thigh is soft and nontender. Skin is warm and well-perfused. He is unable to provide a focal examination but does spontaneously withdrawal to pain throughout the left leg. As a 1+ dorsalis pedis pulse. His calf is soft.  He does  respond to passive stretch and a painful manner.  Lab Results  Component Value Date   WBC 19.6 (H) 12/17/2016   HGB 9.4 (L) 12/17/2016   HCT 29.5 (L) 12/17/2016   MCV 96.1 12/17/2016   PLT 205 12/17/2016   BMET    Component Value Date/Time   NA 148 (H) 12/17/2016 0420   NA 132 (A) 02/17/2016   K 3.2 (L) 12/17/2016 0420   CL 113 (H) 12/17/2016 0420   CO2 28 12/17/2016 0420   GLUCOSE 158 (H) 12/17/2016 0420   BUN 67 (H) 12/17/2016 0420   BUN 37 (A) 02/17/2016   CREATININE 1.11 12/17/2016 0420   CREATININE 1.25 (H) 03/16/2016 1229   CALCIUM 7.1 (L) 12/17/2016 0420   GFRNONAA 59 (L) 12/17/2016 0420   GFRAA >60 12/17/2016 0420     Assessment/Plan: 9 Days Post-Op   Principal Problem:   Closed left hip fracture (HCC) Active Problems:   Hyperlipidemia   CAD (coronary artery disease)   CKD (chronic kidney disease), stage III   Hip fracture (HCC)   Compromised airway   Hypovolemic shock (HCC)   Elevated troponin I level   Chronic combined systolic and diastolic heart failure (HCC)   Respiratory failure (HCC)   - Continue critical care per the ICU.  Prognosis appears guarded with little improvement following days of critical care.  - Aspirin and SCDs for DVT  prophylaxis.  - He is appropriate for 50% weightbearing to the left lower extremity once cleared from a critical-care standpoint.   -call with questions  Yolonda Kida 12/17/2016, 10:01 PM   Maryan Rued, MD [phone]

## 2016-12-17 NOTE — Progress Notes (Signed)
Long discussion w/ family today.  They understand Danny Oneill is critically ill and there is little else to offer.  We extensively discussed: hospital course, treatment to date, and potential outcomes. They understand that there is little more to offer.  For today: Asked GI to see again (See Dr Hulen Shouts note) Continue full range of supportive care Full DNR  Danny Oneill ACNP-BC [facility] Pulmonary/Critical Care Pager # [phone] OR # [phone] if no answer

## 2016-12-17 NOTE — Progress Notes (Signed)
Nutrition Follow-up  DOCUMENTATION CODES:   Non-severe (moderate) malnutrition in context of chronic illness  INTERVENTION:   Tube Feeding:   Recommend continuing Vital AF 1.2 at goal rate of 70 ml/hr via Cortrak tube   NUTRITION DIAGNOSIS:   Malnutrition (moderate) related to chronic illness (CAD, CKD) as evidenced by mild depletion of body fat, mild depletion of muscle mass, percent weight loss (9% weight loss in 3 months).  Being addressed via TF  GOAL:   Patient will meet greater than or equal to 90% of their needs  Met  MONITOR:   Vent status, Labs, TF tolerance, Skin, I & O's  REASON FOR ASSESSMENT:   Consult Enteral/tube feeding initiation and management  ASSESSMENT:   83 yo male with PMH of HLD, lumbar spondylosis, MI, CAD, PAF, cardiomyopathy, CKD, CHF who was admitted on 8/30 with L hip fracture. He developed hypoxia and hypotension after surgery (L IM nail) on 9/4 and required re-intubation and transfer to the ICU.  Noted family requesting transfer to tertiary care facility where hepatology services is available  Pt with acute liver injury, suspected ischemic hepatopathy, cholestasis, possible acute cholecystitis.  Pt also now with C.diff colitis Requiring pressors  9/11 IR placed perc cholecystostomy drain  Pt minimally responsive, c/o diffuse abdominal pain Vital 1.2 AF infusing at rate of 70 ml/hr via Cortrak tube, noted free water of 200 mL every 6 hours  Abdominal xray today with Cortrak tube located in 2nd portion of the duodenum. Non-obstructive bowel gas pattern  Labs: sodium 148,  Potassium 3.2, BUN 67, Creatinine wdl, ammonia wdl, corrected calcium 9.1, albumin 1.5 Meds: D5 at 90 ml/hr, ss novolog,   Diet Order:  Diet NPO time specified  Skin:  Reviewed, no issues  Last BM:  9/13 rectal tube  Height:   Ht Readings from Last 1 Encounters:  12/08/16 6' 1" (1.854 m)    Weight:   Wt Readings from Last 1 Encounters:  12/17/16 221 lb  1.9 oz (100.3 kg)    Ideal Body Weight:  83.6 kg  BMI:  Body mass index is 29.17 kg/m.  Estimated Nutritional Needs:   Kcal:  1900-2250 kcals  Protein:  115-136 g   Fluid:  2 L  EDUCATION NEEDS:   No education needs identified at this time  Cate  MS, RD, LDN [phone] Pager  [phone] Weekend/On-Call Pager  

## 2016-12-17 NOTE — Progress Notes (Signed)
9 Days Post-Op   Subjective/Chief Complaint: somnolent    Objective: Vital signs in last 24 hours: Temp:  [97.5 F (36.4 C)-99 F (37.2 C)] 99 F (37.2 C) (09/13 0338) Pulse Rate:  [72-100] 80 (09/13 0804) Resp:  [19-30] 30 (09/13 0804) BP: (74-102)/(49-68) 90/60 (09/13 0804) SpO2:  [93 %-100 %] 100 % (09/13 0804) Arterial Line BP: (61-113)/(35-56) 61/50 (09/13 0700) Weight:  [100.3 kg (221 lb 1.9 oz)] 100.3 kg (221 lb 1.9 oz) (09/13 0600) Last BM Date: 12/16/16  Intake/Output from previous day: 09/12 0701 - 09/13 0700 In: 3746 [I.V.:2183.2; NG/GT:1462.8; IV Piggyback:100] Out: 1205 [Urine:1200; Drains:5] Intake/Output this shift: Total I/O In: 161.9 [I.V.:91.9; NG/GT:70] Out: 80 [Urine:80]  GI: drain in place bile in bag  Lab Results:   Recent Labs  12/16/16 0420 12/17/16 0420  WBC 23.0* 19.6*  HGB 9.8* 9.4*  HCT 31.0* 29.5*  PLT 205 205   BMET  Recent Labs  12/16/16 0832 12/17/16 0420  NA 151* 148*  K 3.5 3.2*  CL 119* 113*  CO2 27 28  GLUCOSE 133* 158*  BUN 59* 67*  CREATININE 1.01 1.11  CALCIUM 7.6* 7.1*   PT/INR  Recent Labs  12/15/16 0948 12/17/16 0420  LABPROT 19.7* 20.3*  INR 1.68 1.75   ABG No results for input(s): PHART, HCO3 in the last 72 hours.  Invalid input(s): PCO2, PO2  Studies/Results: Ir Perc Cholecystostomy  Result Date: 12/15/2016 INDICATION: 83 year old male with a history of sepsis, jaundice, concern for acute cholecystitis/acalculous cholecystitis EXAM: CHOLECYSTOSTOMY MEDICATIONS: None ANESTHESIA/SEDATION: Moderate (conscious) sedation was employed during this procedure. A total of Versed 1.0 mg and Fentanyl 50 mcg was administered intravenously. Moderate Sedation Time: 10 minutes. The patient's level of consciousness and vital signs were monitored continuously by radiology nursing throughout the procedure under my direct supervision. FLUOROSCOPY TIME:  Fluoroscopy Time: 0 minutes 42 seconds (4 mGy). COMPLICATIONS:  None PROCEDURE: Informed written consent was obtained from the patient and the patient's family after a thorough discussion of the procedural risks, benefits and alternatives. All questions were addressed. Maximal Sterile Barrier Technique was utilized including caps, mask, sterile gowns, sterile gloves, sterile drape, hand hygiene and skin antiseptic. A timeout was performed prior to the initiation of the procedure. Ultrasound survey of the right upper quadrant was performed for planning purposes. Once the patient is prepped and draped in the usual sterile fashion, the skin and subcutaneous tissues overlying the gallbladder were generously infiltrated 1% lidocaine for local anesthesia. A coaxial needle was advanced under ultrasound guidance through the skin subcutaneous tissues and a small segment of liver into the gallbladder lumen. With removal of the stylet, spontaneous dark bile drainage occurred. Using modified Seldinger technique, a 10 French drain was placed into the gallbladder fossa, with aspiration of the sample for the lab. Contrast injection confirmed position of the tube within the gallbladder lumen. Drainage catheter was attached to gravity drain with a suture retention placed. Patient tolerated the procedure well and remained hemodynamically stable throughout. No complications were encountered and no significant blood loss encountered. IMPRESSION: Status post image guided percutaneous cholecystostomy. Signed, Yvone Neu. Loreta Ave, DO [facility] Radiology Electronically Signed   By: Gilmer Mor D.O.   On: 12/15/2016 17:03    Anti-infectives: Anti-infectives    Start     Dose/Rate Route Frequency Ordered Stop   12/16/16 1800  vancomycin (VANCOCIN) 50 mg/mL oral solution 125 mg     125 mg Oral Every 6 hours 12/16/16 1449  12/16/16 1800  cefTAZidime (FORTAZ) 2 g in dextrose 5 % 50 mL IVPB     2 g 100 mL/hr over 30 Minutes Intravenous Every 8  hours 12/16/16 1747     12/14/16 1215  piperacillin-tazobactam (ZOSYN) IVPB 3.375 g  Status:  Discontinued     3.375 g 12.5 mL/hr over 240 Minutes Intravenous Every 8 hours 12/14/16 1149 12/16/16 1740   12/12/16 1200  ampicillin (OMNIPEN) 1 g in sodium chloride 0.9 % 50 mL IVPB  Status:  Discontinued     1 g 150 mL/hr over 20 Minutes Intravenous Every 6 hours 12/12/16 1031 12/14/16 1127   12/09/16 0100  ceFAZolin (ANCEF) IVPB 1 g/50 mL premix     1 g 100 mL/hr over 30 Minutes Intravenous Every 6 hours 12/08/16 2359 12/09/16 1557   12/08/16 2300  cefTRIAXone (ROCEPHIN) 1 g in dextrose 5 % 50 mL IVPB  Status:  Discontinued     1 g 100 mL/hr over 30 Minutes Intravenous Every 24 hours 12/08/16 2231 12/12/16 1002   12/04/16 0600  ceFAZolin (ANCEF) IVPB 2g/100 mL premix     2 g 200 mL/hr over 30 Minutes Intravenous On call to O.R. 12/03/16 1641 12/05/16 0559      Assessment/Plan: Patient Active Problem List   Diagnosis Date Noted  . Respiratory failure (HCC)   . Elevated troponin I level 12/11/2016  . Chronic combined systolic and diastolic heart failure (HCC) 12/11/2016  . Compromised airway   . Hypovolemic shock (HCC)   . Closed left hip fracture (HCC) 12/03/2016  . Hip fracture (HCC) 12/03/2016  . Allergic drug rash 03/09/2016  . Orthostatic hypotension 03/09/2016  . Hyponatremia 03/09/2016  . Hypokalemia 03/09/2016  . Bladder outlet obstruction 03/09/2016  . Urinary retention 03/09/2016  . First degree AV block 03/09/2016  . Aortic insufficiency 03/09/2016  . CKD (chronic kidney disease), stage III 03/09/2016  . Anemia   . Pericardial effusion 02/27/2016  . PAF (paroxysmal atrial fibrillation) (HCC) 02/27/2016  . CAD (coronary artery disease) 02/27/2016  . Cardiomyopathy, ischemic 02/27/2016  . Acute on chronic combined systolic and diastolic CHF (congestive heart failure) (HCC) 02/24/2016  . Systolic and diastolic CHF, acute (HCC) 02/13/2016  . Anterior and lateral ST  segment elevation (HCC) 02/13/2016  . ARF (acute renal failure) (HCC) 02/13/2016  . Acute urinary retention 02/13/2016  . Hematuria 02/13/2016  . Elevated liver enzymes 02/13/2016  . Bilateral lower extremity edema 02/13/2016  . Low BP 02/13/2016  . Hyperlipidemia 02/13/2016  . Acute ST elevation myocardial infarction (STEMI) (HCC)   . Acute inferolateral myocardial infarction (HCC) 02/02/2016  . Acute MI, inferolateral wall, initial episode of care (HCC)   . Arterial hypotension   . Overweight (BMI 25.0-29.9) 10/04/2013  . Expected blood loss anemia 10/04/2013  . S/P right TKA 10/03/2013     No significant change   GB drained  Study in 6 weeks to see if it can be removed    LOS: 14 days    Moesha Sarchet A. 12/17/2016

## 2016-12-17 NOTE — Progress Notes (Signed)
Patient ID: Danny Oneill, male   DOB: Feb 20, 1932, 83 y.o.   MRN: [id number]    Referring Physician(s): Praveen Mannam   Supervising Physician: Malachy Moan  Patient Status: [facility] - In-pt  Chief Complaint: Hyperbilirubinemia   Subjective: Patient still c/o diffuse abdominal pain.    Allergies: Amiodarone and Morphine and related  Medications: Prior to Admission medications   Medication Sig Start Date End Date Taking? Authorizing Provider  aspirin EC 81 MG tablet Take 81 mg by mouth daily with breakfast.    Yes [provider]  atorvastatin (LIPITOR) 20 MG tablet Take 1 tablet (20 mg total) by mouth daily. 04/07/16  Yes Lennette Bihari, MD  carvedilol (COREG) 3.125 MG tablet Take 3.125 mg by mouth 2 (two) times daily. 11/18/16  Yes [provider]  furosemide (LASIX) 20 MG tablet Take 1 tablet by mouth daily and ok to use extra tablet as needed for swelling. 10/14/16  Yes Cardama, Amadeo Garnet, MD  lisinopril (PRINIVIL,ZESTRIL) 2.5 MG tablet Take 1 tablet (2.5 mg total) by mouth daily. 09/21/16 12/20/16 Yes Lennette Bihari, MD  tamsulosin (FLOMAX) 0.4 MG CAPS capsule TAKE 1 CAPSULE(0.4 MG) BY MOUTH DAILY AFTER SUPPER 04/01/16  Yes Dunn, Tacey Ruiz, PA-C  ticagrelor (BRILINTA) 90 MG TABS tablet Take 1 tablet (90 mg total) by mouth 2 (two) times daily. 04/07/16  Yes Lennette Bihari, MD  carvedilol (COREG) 6.25 MG tablet Take 1 tablet (6.25 mg total) by mouth 2 (two) times daily. Patient not taking: Reported on 12/03/2016 04/28/16   Lennette Bihari, MD    Vital Signs: BP (!) 80/51   Pulse 82   Temp 99 F (37.2 C) (Axillary)   Resp (!) 29   Ht  (1.854 m)   Wt 221 lb 1.9 oz (100.3 kg)   SpO2 99%   BMI 29.17 kg/m   Physical Exam: Abd: soft, diffusely tender, perc chole drain in place with bilious output.  Drain site is c/d/i.    Imaging: Ir Perc Cholecystostomy  Result Date: 12/15/2016 INDICATION: 83 year old male with a history of sepsis, jaundice, concern for  acute cholecystitis/acalculous cholecystitis EXAM: CHOLECYSTOSTOMY MEDICATIONS: None ANESTHESIA/SEDATION: Moderate (conscious) sedation was employed during this procedure. A total of Versed 1.0 mg and Fentanyl 50 mcg was administered intravenously. Moderate Sedation Time: 10 minutes. The patient's level of consciousness and vital signs were monitored continuously by radiology nursing throughout the procedure under my direct supervision. FLUOROSCOPY TIME:  Fluoroscopy Time: 0 minutes 42 seconds (4 mGy). COMPLICATIONS: None PROCEDURE: Informed written consent was obtained from the patient and the patient's family after a thorough discussion of the procedural risks, benefits and alternatives. All questions were addressed. Maximal Sterile Barrier Technique was utilized including caps, mask, sterile gowns, sterile gloves, sterile drape, hand hygiene and skin antiseptic. A timeout was performed prior to the initiation of the procedure. Ultrasound survey of the right upper quadrant was performed for planning purposes. Once the patient is prepped and draped in the usual sterile fashion, the skin and subcutaneous tissues overlying the gallbladder were generously infiltrated 1% lidocaine for local anesthesia. A coaxial needle was advanced under ultrasound guidance through the skin subcutaneous tissues and a small segment of liver into the gallbladder lumen. With removal of the stylet, spontaneous dark bile drainage occurred. Using modified Seldinger technique, a 10 French drain was placed into the gallbladder fossa, with aspiration of the sample for the lab. Contrast injection confirmed position of the tube within the gallbladder lumen. Drainage catheter was attached to  gravity drain with a suture retention placed. Patient tolerated the procedure well and remained hemodynamically stable throughout. No complications were encountered and no significant blood loss encountered. IMPRESSION: Status post image guided percutaneous  cholecystostomy. Signed, Yvone Neu. Loreta Ave, DO [facility] Radiology Electronically Signed   By: Gilmer Mor D.O.   On: 12/15/2016 17:03   Dg Chest Port 1 View  Result Date: 12/15/2016 CLINICAL DATA:  Prior history of respiratory failure. Asbestosis. Coronary artery disease. EXAM: PORTABLE CHEST 1 VIEW COMPARISON:  12/10/2016. FINDINGS: Interim extubation. Interim removal of NG tube and placement of feeding tube. Right IJ line stable position. Heart size normal. Chronic bilateral from interstitial prominence with bilateral calcified pleural plaques are noted consistent with history of asbestosis. No new pulmonary findings. IMPRESSION: For 1 interim extubation. Interim removal of NG tube replacement feeding tube. Right IJ line stable position. 2. Bilateral pulmonary and pleural changes of asbestosis again noted without interim change. No acute pulmonary abnormality identified. Electronically Signed   By: Maisie Fus  Register   On: 12/15/2016 07:11    Labs:  CBC:  Recent Labs  12/14/16 0318 12/15/16 0521 12/16/16 0420 12/17/16 0420  WBC 16.7* 21.9* 23.0* 19.6*  HGB 8.6* 9.7* 9.8* 9.4*  HCT 27.1* 30.4* 31.0* 29.5*  PLT 118* 182 205 205    COAGS:  Recent Labs  02/02/16 2100 12/08/16 2301 12/15/16 0948 12/17/16 0420  INR 1.29 2.34 1.68 1.75  APTT 81*  --  33  --     BMP:  Recent Labs  12/15/16 0521 12/16/16 0420 12/16/16 0832 12/17/16 0420  NA 151* 152* 151* 148*  K 4.0 4.1 3.5 3.2*  CL 117* 120* 119* 113*  CO2 27 26 27 28   GLUCOSE 116* 112* 133* 158*  BUN 60* 60* 59* 67*  CALCIUM 7.7* 7.7* 7.6* 7.1*  CREATININE 0.92 0.95 1.01 1.11  GFRNONAA >60 >60 >60 59*  GFRAA >60 >60 >60 >60    LIVER FUNCTION TESTS:  Recent Labs  12/14/16 0318 12/15/16 0521 12/16/16 0832 12/17/16 0420  BILITOT 17.8* 19.6* 19.8* 20.1*  AST 123* 159* 121* 166*  ALT 66* 89* 72* 84*  ALKPHOS 99 110 95 91  PROT 5.0* 4.9* 4.8* 4.6*  ALBUMIN  1.9* 1.8* 1.6* 1.5*    Assessment and Plan: 1. Hyperbilirubinemia, s/p perc chole drain  Unfortunately, the patient's abdominal pain has broadened and thought to now be secondary to C. Diff colitis.  The perc chole drain has not helped his discomfort and his TB is still around 20, likely from shock liver and cholestasis.  The drain will need to remain is place for around at least 6 weeks though to mature the tract prior to removal.  This was discussed with the family at the bedside today.  They understand.  Cont routine drain care for now and will follow.  Electronically Signed: Letha Cape 12/17/2016, 9:52 AM   I spent a total of 15 Minutes at the the patient's bedside AND on the patient's hospital floor or unit, greater than 50% of which was counseling/coordinating care for hyperbilirubinemia

## 2016-12-17 NOTE — Progress Notes (Signed)
PULMONARY / CRITICAL CARE MEDICINE   Name: Danny Oneill MRN: [id number] DOB: 1931-04-16    ADMISSION DATE:  12/03/2016 CONSULTATION DATE:  12/08/2016  REFERRING MD:  OR   CHIEF COMPLAINT:  Hypotension  HISTORY OF PRESENT ILLNESS:   83 yo male admitted with Lt hip comminuted fracture after fall at home.  Developed agitated delirium while in hospital.  Had Lt intertrochanteric nailing on 9/04 with EBL 200 ml.  Noted to have urine retention.  Post op had hypotension, progressive anemia, and required reintubation.  Found to have E coli UTI.   PMHx of CAD, systolic CHF with EF 45 to 50%, HLD, PAF, CKD, Asbestosis exposure, BPH.  SUBJECTIVE:  Still on pressors MS not better Wbc a little improved   VITAL SIGNS: BP 90/60 (BP Location: Right Arm)   Pulse 80   Temp 99 F (37.2 C) (Axillary)   Resp (!) 30   Ht  (1.854 m)   Wt 221 lb 1.9 oz (100.3 kg)   SpO2 100%   BMI 29.17 kg/m   INTAKE / OUTPUT:  Intake/Output Summary (Last 24 hours) at 12/17/16 2440 Last data filed at 12/17/16 0800  Gross per 24 hour  Intake          3825.98 ml  Output             1285 ml  Net          2540.98 ml    General appearance:  83 Year old  Male confused, still w/ sig pain  Eyes:sclerae icteric , moist conjunctivae; PERRL, EOMI bilaterally. Mouth: dry  membranes but no mucosal ulcerations; normal hard and soft palate Neck: Trachea midline; neck supple, no JVD Lungs/chest: CTA, with normal respiratory effort and no intercostal retractions CV: RRR, no MRGs  Abdomen: more firm today, still diffusely tender; no masses or HSM Extremities: + anasarca, painful to touch  Skin: Normal temperature, turgor jaundice  Psych: moves some but has pain everywhere. Moans only     LABS:  BMET  Recent Labs Lab 12/16/16 0420 12/16/16 0832 12/17/16 0420  NA 152* 151* 148*  K 4.1 3.5 3.2*  CL 120* 119* 113*  CO2 BUN 60* 59* 67*  CREATININE 0.95 1.01 1.11  GLUCOSE 112* 133* 158*     Electrolytes  Recent Labs Lab 12/12/16 1732 12/13/16 0335 12/13/16 1739  12/16/16 0420 12/16/16 0832 12/17/16 0420  CALCIUM 7.9* 7.8*  --   < > 7.7* 7.6* 7.1*  MG 2.3 2.2 2.2  --   --   --   --   PHOS 2.5 1.6* 2.6  --   --   --   --   < > = values in this interval not displayed.  CBC  Recent Labs Lab 12/15/16 0521 12/16/16 0420 12/17/16 0420  WBC 21.9* 23.0* 19.6*  HGB 9.7* 9.8* 9.4*  HCT 30.4* 31.0* 29.5*  PLT 182 205 205    Coag's  Recent Labs Lab 12/15/16 0948 12/17/16 0420  APTT 33  --   INR 1.68 1.75    Sepsis Markers No results for input(s): LATICACIDVEN, PROCALCITON, O2SATVEN in the last 168 hours.  ABG  Recent Labs Lab 12/12/16 0320  PHART 7.402  PCO2ART 37.1  PO2ART 74.0*    Liver Enzymes  Recent Labs Lab 12/15/16 0521 12/16/16 0832 12/17/16 0420  AST 159* 121* 166*  ALT 89* 72* 84*  ALKPHOS 110 95 91  BILITOT 19.6* 19.8* 20.1*  ALBUMIN 1.8* 1.6* 1.5*  Cardiac Enzymes  Recent Labs Lab 12/10/16 1018  TROPONINI 0.07*    Glucose  Recent Labs Lab 12/16/16 1126 12/16/16 1557 12/16/16 2032 12/16/16 2350 12/17/16 0331 12/17/16 0817  GLUCAP 129* 111* 165* 154* 149* 168*    Imaging No results found.  STUDIES:  Abd u/s 9/08 >>No gallstones. There is slight sludge in the gallbladder and there is slight diffuse thickening of the gallbladder wall. Negative sonographic Murphy's sign.  CULTURES: 9/4 BC >> negative 9/4 UC >> E coli 9/11 blood culture>>>neg 9/12 perc drain >>>  ANTIBIOTICS: 9/4 ceftriaxone >> 9/8 9/8 ampicillin >> 9/10 Zosyn 9/10>>>9/12 ceftaz 9/12>>>  SIGNIFICANT EVENTS: 8/30 Admit 9/04  Surgery/ tx to ICU for hypotension 9/10: GI, surgical service and IR consulted  LINES/TUBES: 9/4 Left Radial Aline >>9/12 9/4 R TL IJ CVC >>  9/4 ETT >> 9/6 Perc GB drain 9/12>>  DISCUSSION: 83 yo with fall and hip fx.  Developed shock and VDRF after surgery from E coli UTI with sepsis and blood loss  after surgery. -clinically continued to decline: WBC increasing, fever spike, tBilli still climbing, less responsive, still on pressors. Has + murphy's sign, I felt like this is acute acalculous cholecystitis and we have since placed Perc drain on 9/11. Started oral vanc for C diff 9/12 & changed his abx to avoid hepatic toxicity.  For 9/13: his hepatic fxn and total bili are still rising. His WBCs have dropped some since starting oral vanc.  His pressor needs:still elevated  Dr Isaiah Serge spoke to [name] and [facility] ICU beds.  For today Cont supportive care as outlined below Await to see of either hospitals above have beds   ASSESSMENT / PLAN:  Septic shock initially from E coli UTI; now presumptive acute cholecystitis  Wbc ct cont to rise. No new fever Cultures pending from perc drain Plan Day 10 abx, ceftaz day 2 (changed from zosyn d/t hepatic concern) Wean for SBP > 95 Trend CBC & fever curve  PMC Ck Cdiff; had positive PCR w/ indeterminate levels of toxin. Given abd pain, diarrhea & leukocytosis started Vanc 9/12 Plan Cont oral vanc   Elevated LFTs (cont to worsen)-->likely multifactorial  Elevated ammonia. Probable acute acalculous colicystitis  -abd [place] w/ GB sludge and diffuse thickening;  + murphy's sign  S/p perc drain 9/11; Jaundice -->bili cont to climb  - stopped zosyn ? Hepatic toxicity  Plan Holding lipitor Drain per IR (will be there for 6 weeks) Cont to trend LFTs Will get 2 view of abd for pain   Acute hypoxic respiratory failure. Hx of asbestos related pleural plaques. -extubated 8/6 Plan Wean oxygen Asp precautions  Hx of CAD s/p DES 02/2016, chronic combined CHF, HLD, PAF. Plan Cont asa Holding Brilenta d/t hepatic dysfxn (should be ok as stent placed back in 2017)  Acute renal failure >> improved/resolved Hx of BPH with urine retention. Hypernatremia,  and hyperchloremia -->Na improved some Hyperkalemia  Plan Cont D5w at 90cc/hr Cont free  water flush Replace K Repeat am chem   Dysphagia. Moderate protein calorie malnutrition. Plan Start tubefeeds   ABLA. Thrombocytopenia. -stable Plan Trend cbc PAS Resume Brillenta if LFTs improved   Acute metabolic encephalopathy. -likely multifactorial  Plan Holding lactulose d/t high stool output Holding sedating meds Supportive care for infections  Correcting hypernatremia   Hyperglycemia Plan ssi   Lt intertrochantric intramedullary nailing 12/08/16. Plan Per ortho  DVT prophylaxis - SCD SUP - Not indicated Nutrition - tube feeds Goals of care - full code  Updated pt's family at bedside My cct 40 min   Simonne Martinet ACNP-BC [facility] Pulmonary/Critical Care Pager # [phone] OR # [phone] if no answer

## 2016-12-17 NOTE — Progress Notes (Signed)
Subjective: Minimally interactive.  Objective: Vital signs in last 24 hours: Temp:  [98 F (36.7 C)-99 F (37.2 C)] 98.6 F (37 C) (09/13 1111) Pulse Rate:  [74-107] 93 (09/13 1100) Resp:  [19-30] 26 (09/13 1100) BP: (80-102)/(49-68) 94/59 (09/13 1100) SpO2:  [93 %-100 %] 97 % (09/13 1100) Arterial Line BP: (61-113)/(40-56) 61/50 (09/13 0700) Weight:  [221 lb 1.9 oz (100.3 kg)] 221 lb 1.9 oz (100.3 kg) (09/13 0600) Weight change: 2 lb 10.3 oz (1.2 kg) Last BM Date: 12/16/16  PE: GEN:  Minimally interactive, jaundiced ABD:  Mild protuberance diffuse, RUQ drain in place, scant bowel sounds  Lab Results: CBC    Component Value Date/Time   WBC 19.6 (H) 12/17/2016 0420   RBC 3.07 (L) 12/17/2016 0420   HGB 9.4 (L) 12/17/2016 0420   HCT 29.5 (L) 12/17/2016 0420   PLT 205 12/17/2016 0420   MCV 96.1 12/17/2016 0420   MCH 30.6 12/17/2016 0420   MCHC 31.9 12/17/2016 0420   RDW 22.5 (H) 12/17/2016 0420   LYMPHSABS 0.4 (L) 12/09/2016 0558   MONOABS 0.4 12/09/2016 0558   EOSABS 0.0 12/09/2016 0558   BASOSABS 0.0 12/09/2016 0558   CMP     Component Value Date/Time   NA 148 (H) 12/17/2016 0420   NA 132 (A) 02/17/2016   K 3.2 (L) 12/17/2016 0420   CL 113 (H) 12/17/2016 0420   CO2 28 12/17/2016 0420   GLUCOSE 158 (H) 12/17/2016 0420   BUN 67 (H) 12/17/2016 0420   BUN 37 (A) 02/17/2016   CREATININE 1.11 12/17/2016 0420   CREATININE 1.25 (H) 03/16/2016 1229   CALCIUM 7.1 (L) 12/17/2016 0420   PROT 4.6 (L) 12/17/2016 0420   ALBUMIN 1.5 (L) 12/17/2016 0420   AST 166 (H) 12/17/2016 0420   ALT 84 (H) 12/17/2016 0420   ALKPHOS 91 12/17/2016 0420   BILITOT 20.1 (HH) 12/17/2016 0420   GFRNONAA 59 (L) 12/17/2016 0420   GFRAA >60 12/17/2016 0420   Assessment:  1.  Acute liver injury.  Suspect ischemic hepatopathy after multiple complications from recent left hip surgery, may also have component of cholestasis of sepsis as well as drug induced liver injury. 2.  C. Diff  colitis. 3.  Possible acalculous cholecystitis, percutaneous drain in place.  Plan:  1.  Hydration, supportive care.  Patient not liver transplant (both from general health standpoint and from treatment of active infection). 2.  Increase vancomycin to 500 mg po q 6 hrs; I've discussed this with Anders Simmonds, FNP of PCCM team. 3.  Prognosis guarded.  4.  Will follow at a distance.   Danny Oneill 12/17/2016, 1:36 PM   Cell [phone] If no answer or after 5 PM call [phone]

## 2016-12-18 ENCOUNTER — Encounter (HOSPITAL_COMMUNITY): Payer: Self-pay | Admitting: Anesthesiology

## 2016-12-18 LAB — GLUCOSE, CAPILLARY
GLUCOSE-CAPILLARY: 109 mg/dL — AB (ref 65–99)
GLUCOSE-CAPILLARY: 132 mg/dL — AB (ref 65–99)
GLUCOSE-CAPILLARY: 153 mg/dL — AB (ref 65–99)
Glucose-Capillary: 159 mg/dL — ABNORMAL HIGH (ref 65–99)
Glucose-Capillary: 130 mg/dL — ABNORMAL HIGH (ref 65–99)
Glucose-Capillary: 161 mg/dL — ABNORMAL HIGH (ref 65–99)
Glucose-Capillary: 156 mg/dL — ABNORMAL HIGH (ref 65–99)

## 2016-12-18 LAB — COMPREHENSIVE METABOLIC PANEL
ALBUMIN: 1.4 g/dL — AB (ref 3.5–5.0)
ALT: 97 U/L — ABNORMAL HIGH (ref 17–63)
AST: 144 U/L — AB (ref 15–41)
Alkaline Phosphatase: 96 U/L (ref 38–126)
Anion gap: 7 (ref 5–15)
BILIRUBIN TOTAL: 20 mg/dL — AB (ref 0.3–1.2)
BUN: 62 mg/dL — AB (ref 6–20)
CO2: 26 mmol/L (ref 22–32)
Calcium: 7 mg/dL — ABNORMAL LOW (ref 8.9–10.3)
Chloride: 113 mmol/L — ABNORMAL HIGH (ref 101–111)
Creatinine, Ser: 0.81 mg/dL (ref 0.61–1.24)
GFR calc Af Amer: >60 mL/min (ref >60)
GFR calc non Af Amer: >60 mL/min (ref >60)
GLUCOSE: 166 mg/dL — AB (ref 65–99)
POTASSIUM: 3.2 mmol/L — AB (ref 3.5–5.1)
SODIUM: 146 mmol/L — AB (ref 135–145)
TOTAL PROTEIN: 4.5 g/dL — AB (ref 6.5–8.1)

## 2016-12-18 LAB — BILIRUBIN, FRACTIONATED(TOT/DIR/INDIR)
BILIRUBIN DIRECT: 13 mg/dL — AB (ref 0.1–0.5)
BILIRUBIN TOTAL: 19 mg/dL — AB (ref 0.3–1.2)
Indirect Bilirubin: 6 mg/dL — ABNORMAL HIGH (ref 0.3–0.9)

## 2016-12-18 LAB — CBC
HEMATOCRIT: 29.3 % — AB (ref 39.0–52.0)
Hemoglobin: 9.4 g/dL — ABNORMAL LOW (ref 13.0–17.0)
MCH: 30.5 pg (ref 26.0–34.0)
MCHC: 32.1 g/dL (ref 30.0–36.0)
MCV: 95.1 fL (ref 78.0–100.0)
Platelets: 240 10*3/uL (ref 150–400)
RBC: 3.08 MIL/uL — ABNORMAL LOW (ref 4.22–5.81)
RDW: 23.6 % — AB (ref 11.5–15.5)
WBC: 19.8 10*3/uL — ABNORMAL HIGH (ref 4.0–10.5)

## 2016-12-18 MED ORDER — POTASSIUM CHLORIDE 20 MEQ/15ML (10%) PO SOLN
40.0000 meq | Freq: Once | ORAL | Status: AC
  Administered 2016-12-18: 40 meq
  Filled 2016-12-18: qty 30

## 2016-12-18 NOTE — Progress Notes (Signed)
eLink Physician-Brief Progress Note Patient Name: Geofrey Harbold DOB: January 19, 1932 MRN: [id number]   Date of Service  12/18/2016  HPI/Events of Note    eICU Interventions  Hypokalemia -repleted      Intervention Category Intermediate Interventions: Electrolyte abnormality - evaluation and management  Wilhemina Grall V. 12/18/2016, 5:16 PM

## 2016-12-18 NOTE — Progress Notes (Signed)
PULMONARY / CRITICAL CARE MEDICINE   Name: Danny Oneill MRN: [id number] DOB: [birth year]    ADMISSION DATE:  12/03/2016 CONSULTATION DATE:  12/08/2016  REFERRING MD:  OR   CHIEF COMPLAINT:  Hypotension  HISTORY OF PRESENT ILLNESS:   83 yo male admitted with Lt hip comminuted fracture after fall at home.  Developed agitated delirium while in hospital.  Had Lt intertrochanteric nailing on 9/04 with EBL 200 ml.  Noted to have urine retention.  Post op had hypotension, progressive anemia, and required reintubation.  Found to have E coli UTI.   PMHx of CAD, systolic CHF with EF 45 to 50%, HLD, PAF, CKD, Asbestosis exposure, BPH.  SUBJECTIVE:  Pain a little better Tbili finally trending down  VITAL SIGNS: BP (!) 89/55   Pulse 81   Temp 97.7 F (36.5 C) (Axillary)   Resp (!) 29   Ht  (1.854 m)   Wt 221 lb 1.9 oz (100.3 kg)   SpO2 100%   BMI 29.17 kg/m   INTAKE / OUTPUT:  Intake/Output Summary (Last 24 hours) at 12/18/16 4098 Last data filed at 12/18/16 0800  Gross per 24 hour  Intake          4327.75 ml  Output             1650 ml  Net          2677.75 ml    General appearance:  83 Year old  Male,  Laying in bed. Not in distress. Still jaundice  Eyes: sclerae icteric , moist conjunctivae; PERRL, EOMI bilaterally. Mouth: mucous  membranes are dry Neck: Trachea midline; neck supple, no JVD Lungs/chest: CTA, decreased in bases. with normal respiratory effort and no intercostal retractions CV: RRR, no MRGs  Abdomen: Soft, less-tender; no masses or HSM Extremities: generalized anasarca Skin: Normal temperature, turgor and texture; jaundice  Psych: still lethargic, moans, less pain today    LABS:  BMET  Recent Labs Lab 12/16/16 0420 12/16/16 0832 12/17/16 0420  NA 152* 151* 148*  K 4.1 3.5 3.2*  CL 120* 119* 113*  CO2 BUN 60* 59* 67*  CREATININE 0.95 1.01 1.11  GLUCOSE 112* 133* 158*    Electrolytes  Recent Labs Lab 12/12/16 1732  12/13/16 0335 12/13/16 1739  12/16/16 0420 12/16/16 0832 12/17/16 0420  CALCIUM 7.9* 7.8*  --   < > 7.7* 7.6* 7.1*  MG 2.3 2.2 2.2  --   --   --   --   PHOS 2.5 1.6* 2.6  --   --   --   --   < > = values in this interval not displayed.  CBC  Recent Labs Lab 12/15/16 0521 12/16/16 0420 12/17/16 0420  WBC 21.9* 23.0* 19.6*  HGB 9.7* 9.8* 9.4*  HCT 30.4* 31.0* 29.5*  PLT 182 205 205    Coag's  Recent Labs Lab 12/15/16 0948 12/17/16 0420  APTT 33  --   INR 1.68 1.75    Sepsis Markers No results for input(s): LATICACIDVEN, PROCALCITON, O2SATVEN in the last 168 hours.  ABG  Recent Labs Lab 12/12/16 0320  PHART 7.402  PCO2ART 37.1  PO2ART 74.0*    Liver Enzymes  Recent Labs Lab 12/15/16 0521 12/16/16 0832 12/17/16 0420 12/18/16 0534  AST 159* 121* 166*  --   ALT 89* 72* 84*  --   ALKPHOS 110 95 91  --   BILITOT 19.6* 19.8* 20.1* 19.0*  ALBUMIN 1.8* 1.6* 1.5*  --  Cardiac Enzymes No results for input(s): TROPONINI, PROBNP in the last 168 hours.  Glucose  Recent Labs Lab 12/17/16 1110 12/17/16 1611 12/17/16 2034 12/18/16 0028 12/18/16 0400 12/18/16 0714  GLUCAP 145* 134* 150* 153* 159* 130*    Imaging Dg Abd Portable 2v  Result Date: 12/17/2016 CLINICAL DATA:  83 year old male with abdominal pain. Evaluate for pneumoperitoneum. EXAM: PORTABLE ABDOMEN - 2 VIEW COMPARISON:  None. FINDINGS: Feeding tube tip is in the second portion of the duodenum. Percutaneous cholecystostomy drain noted projecting over the right upper quadrant of the abdomen. Gas and stool are noted in the colon and distal rectum. No pathologic dilatation of small bowel. Decubitus view demonstrates no pneumoperitoneum. Calcified pleural plaques are incidentally noted. IMPRESSION: 1. No pneumoperitoneum. 2. Nonobstructive bowel gas pattern. 3. Support apparatus, as above. Electronically Signed   By: Trudie Reed M.D.   On: 12/17/2016 11:13    STUDIES:  Abd u/s 9/08 >>No  gallstones. There is slight sludge in the gallbladder and there is slight diffuse thickening of the gallbladder wall. Negative sonographic Murphy's sign.  CULTURES: 9/4 BC >> negative 9/4 UC >> E coli 9/11 blood culture>>>neg 9/12 perc drain >>>  ANTIBIOTICS: 9/4 ceftriaxone >> 9/8 9/8 ampicillin >> 9/10 Zosyn 9/10>>>9/12 ceftaz 9/12>>>  SIGNIFICANT EVENTS: 8/30 Admit 9/04  Surgery/ tx to ICU for hypotension 9/10: GI, surgical service and IR consulted  LINES/TUBES: 9/4 Left Radial Aline >>9/12 9/4 R TL IJ CVC >>  9/4 ETT >> 9/6 Perc GB drain 9/12>>  DISCUSSION: 83 yo with fall and hip fx.  Developed shock and VDRF after surgery from E coli UTI with sepsis and blood loss after surgery. -clinically continued to decline: WBC increasing, fever spike, tBilli still climbing, less responsive, still on pressors. Has + murphy's sign, I felt like this is acute acalculous cholecystitis and we have since placed Perc drain on 9/11. Started oral vanc for C diff 9/12 & changed his abx to avoid hepatic toxicity.  For 9/13: his hepatic fxn and total bili are still rising. His WBCs have dropped some since starting oral vanc.  His pressor needs:still elevated  Dr Isaiah Serge spoke to [name] and [facility] ICU beds.  9/14 t-bili down for first time Pain a little better Nothing new for today--> cont current supportive rx   ASSESSMENT / PLAN:  Septic shock initially from E coli UTI; now presumptive acute cholecystitis  Cultures pending from perc drain Plan Day 11 abx, ceftaz # 3.  Will stop at day 14  Wean levo for MAP > 65  PMC Ck Cdiff; had positive PCR w/ indeterminate levels of toxin. Given abd pain, diarrhea & leukocytosis started Vanc 9/12 Plan Cont oral vanc (will extend 14d after other abx stopped)   Elevated LFTs (cont to worsen)-->likely multifactorial  Elevated ammonia. Probable acute acalculous colicystitis  -abd [place] w/ GB sludge and diffuse thickening;  + murphy's sign  S/p perc  drain 9/11; Jaundice -->bili finally dropping - stopped zosyn ? Hepatic toxicity  Plan Holding lipitor Perc drain per IR (will need to be in for at least 6 weeks) Cont to trend LFTs   Acute hypoxic respiratory failure. Hx of asbestos related pleural plaques. -extubated 8/6 Plan Wean O2, cont pulse ox and cont asp precautions  Hx of CAD s/p DES 02/2016, chronic combined CHF, HLD, PAF. Plan Off Brilenta; stopping asa as well d/t LFTs Cont tele   Acute renal failure >> improved/resolved Hx of BPH with urine retention. Hypernatremia,  and hyperchloremia -->Na improved some  Hyperkalemia  Plan Cont D5W at 90 and free water flushes-->adjust as indicated F/u am labs (pending)   Dysphagia. Moderate protein calorie malnutrition. Plan Cont tubefeeds   ABLA. Thrombocytopenia. -stable Plan Trend cbc PAS  Acute metabolic encephalopathy. -likely multifactorial -->maybe a little better Plan No sedating meds Supportive care rx infection   Hyperglycemia Plan ssi   Lt intertrochantric intramedullary nailing 12/08/16. Plan Per ortho  DVT prophylaxis - SCD SUP - Not indicated Nutrition - tube feeds Goals of care - full code  Updated pt's family at bedside My cct 40 min   Simonne Martinet ACNP-BC [facility] Pulmonary/Critical Care Pager # [phone] OR # [phone] if no answer

## 2016-12-18 NOTE — Progress Notes (Signed)
10 Days Post-Op   Subjective/Chief Complaint: somnolent   Objective: Vital signs in last 24 hours: Temp:  [97.7 F (36.5 C)-98.7 F (37.1 C)] 97.7 F (36.5 C) (09/14 0720) Pulse Rate:  [77-107] 80 (09/14 0730) Resp:  [22-32] 29 (09/14 0730) BP: (80-105)/(50-69) 92/53 (09/14 0730) SpO2:  [93 %-100 %] 99 % (09/14 0730) Weight:  [100.3 kg (221 lb 1.9 oz)] 100.3 kg (221 lb 1.9 oz) (09/14 0500) Last BM Date: 12/17/16  Intake/Output from previous day: 09/13 0701 - 09/14 0700 In: 4324 [I.V.:2164; NG/GT:2010; IV Piggyback:150] Out: 1630 [Urine:1630] Intake/Output this shift: No intake/output data recorded.  perc drain functioning   Lab Results:   Recent Labs  12/16/16 0420 12/17/16 0420  WBC 23.0* 19.6*  HGB 9.8* 9.4*  HCT 31.0* 29.5*  PLT 205 205   BMET  Recent Labs  12/16/16 0832 12/17/16 0420  NA 151* 148*  K 3.5 3.2*  CL 119* 113*  CO2 27 28  GLUCOSE 133* 158*  BUN 59* 67*  CREATININE 1.01 1.11  CALCIUM 7.6* 7.1*   PT/INR  Recent Labs  12/15/16 0948 12/17/16 0420  LABPROT 19.7* 20.3*  INR 1.68 1.75   ABG No results for input(s): PHART, HCO3 in the last 72 hours.  Invalid input(s): PCO2, PO2  Studies/Results: Dg Abd Portable 2v  Result Date: 12/17/2016 CLINICAL DATA:  83 year old male with abdominal pain. Evaluate for pneumoperitoneum. EXAM: PORTABLE ABDOMEN - 2 VIEW COMPARISON:  None. FINDINGS: Feeding tube tip is in the second portion of the duodenum. Percutaneous cholecystostomy drain noted projecting over the right upper quadrant of the abdomen. Gas and stool are noted in the colon and distal rectum. No pathologic dilatation of small bowel. Decubitus view demonstrates no pneumoperitoneum. Calcified pleural plaques are incidentally noted. IMPRESSION: 1. No pneumoperitoneum. 2. Nonobstructive bowel gas pattern. 3. Support apparatus, as above. Electronically Signed   By: Trudie Reed M.D.   On: 12/17/2016 11:13     Anti-infectives: Anti-infectives    Start     Dose/Rate Route Frequency Ordered Stop   12/18/16 0000  vancomycin (VANCOCIN) 50 mg/mL oral solution 500 mg     500 mg Per Tube Every 6 hours 12/17/16 2333     12/17/16 1800  vancomycin (VANCOCIN) 50 mg/mL oral solution 500 mg  Status:  Discontinued     500 mg Oral Every 6 hours 12/17/16 1509 12/17/16 2333   12/16/16 1800  vancomycin (VANCOCIN) 50 mg/mL oral solution 125 mg  Status:  Discontinued     125 mg Oral Every 6 hours 12/16/16 1449 12/17/16 1509   12/16/16 1800  cefTAZidime (FORTAZ) 2 g in dextrose 5 % 50 mL IVPB     2 g 100 mL/hr over 30 Minutes Intravenous Every 8 hours 12/16/16 1747     12/14/16 1215  piperacillin-tazobactam (ZOSYN) IVPB 3.375 g  Status:  Discontinued     3.375 g 12.5 mL/hr over 240 Minutes Intravenous Every 8 hours 12/14/16 1149 12/16/16 1740   12/12/16 1200  ampicillin (OMNIPEN) 1 g in sodium chloride 0.9 % 50 mL IVPB  Status:  Discontinued     1 g 150 mL/hr over 20 Minutes Intravenous Every 6 hours 12/12/16 1031 12/14/16 1127   12/09/16 0100  ceFAZolin (ANCEF) IVPB 1 g/50 mL premix     1 g 100 mL/hr over 30 Minutes Intravenous Every 6 hours 12/08/16 2359 12/09/16 1557   12/08/16 2300  cefTRIAXone (ROCEPHIN) 1 g in dextrose 5 % 50 mL IVPB  Status:  Discontinued  1 g 100 mL/hr over 30 Minutes Intravenous Every 24 hours 12/08/16 2231 12/12/16 1002   12/04/16 0600  ceFAZolin (ANCEF) IVPB 2g/100 mL premix     2 g 200 mL/hr over 30 Minutes Intravenous On call to O.R. 12/03/16 1641 12/05/16 0559      Assessment/Plan: Patient Active Problem List   Diagnosis Date Noted  . Respiratory failure (HCC)   . Elevated troponin I level 12/11/2016  . Chronic combined systolic and diastolic heart failure (HCC) 12/11/2016  . Compromised airway   . Hypovolemic shock (HCC)   . Closed left hip fracture (HCC) 12/03/2016  . Hip fracture (HCC) 12/03/2016  . Allergic drug rash 03/09/2016  . Orthostatic hypotension  03/09/2016  . Hyponatremia 03/09/2016  . Hypokalemia 03/09/2016  . Bladder outlet obstruction 03/09/2016  . Urinary retention 03/09/2016  . First degree AV block 03/09/2016  . Aortic insufficiency 03/09/2016  . CKD (chronic kidney disease), stage III 03/09/2016  . Anemia   . Pericardial effusion 02/27/2016  . PAF (paroxysmal atrial fibrillation) (HCC) 02/27/2016  . CAD (coronary artery disease) 02/27/2016  . Cardiomyopathy, ischemic 02/27/2016  . Acute on chronic combined systolic and diastolic CHF (congestive heart failure) (HCC) 02/24/2016  . Systolic and diastolic CHF, acute (HCC) 02/13/2016  . Anterior and lateral ST segment elevation (HCC) 02/13/2016  . ARF (acute renal failure) (HCC) 02/13/2016  . Acute urinary retention 02/13/2016  . Hematuria 02/13/2016  . Elevated liver enzymes 02/13/2016  . Bilateral lower extremity edema 02/13/2016  . Low BP 02/13/2016  . Hyperlipidemia 02/13/2016  . Acute ST elevation myocardial infarction (STEMI) (HCC)   . Acute inferolateral myocardial infarction (HCC) 02/02/2016  . Acute MI, inferolateral wall, initial episode of care (HCC)   . Arterial hypotension   . Overweight (BMI 25.0-29.9) 10/04/2013  . Expected blood loss anemia 10/04/2013  . S/P right TKA 10/03/2013   Perc cholecystostomy in place   Nothing else to offer from surgery stand point    Will sign off    LOS: 15 days    Delman Goshorn A. 12/18/2016

## 2016-12-19 LAB — COMPREHENSIVE METABOLIC PANEL
ALT: 95 U/L — ABNORMAL HIGH (ref 17–63)
AST: 137 U/L — ABNORMAL HIGH (ref 15–41)
Albumin: 1.3 g/dL — ABNORMAL LOW (ref 3.5–5.0)
Alkaline Phosphatase: 101 U/L (ref 38–126)
Anion gap: 4 — ABNORMAL LOW (ref 5–15)
BUN: 57 mg/dL — ABNORMAL HIGH (ref 6–20)
CHLORIDE: 112 mmol/L — AB (ref 101–111)
CO2: 27 mmol/L (ref 22–32)
Calcium: 7 mg/dL — ABNORMAL LOW (ref 8.9–10.3)
Creatinine, Ser: 0.87 mg/dL (ref 0.61–1.24)
GFR calc Af Amer: >60 mL/min (ref >60)
GFR calc non Af Amer: >60 mL/min (ref >60)
GLUCOSE: 207 mg/dL — AB (ref 65–99)
POTASSIUM: 3.1 mmol/L — AB (ref 3.5–5.1)
Sodium: 143 mmol/L (ref 135–145)
TOTAL PROTEIN: 4.7 g/dL — AB (ref 6.5–8.1)
Total Bilirubin: 18.6 mg/dL (ref 0.3–1.2)

## 2016-12-19 LAB — CBC
HEMATOCRIT: 29.3 % — AB (ref 39.0–52.0)
HEMOGLOBIN: 9.5 g/dL — AB (ref 13.0–17.0)
MCH: 30.6 pg (ref 26.0–34.0)
MCHC: 32.4 g/dL (ref 30.0–36.0)
MCV: 94.5 fL (ref 78.0–100.0)
Platelets: 256 10*3/uL (ref 150–400)
RBC: 3.1 MIL/uL — ABNORMAL LOW (ref 4.22–5.81)
RDW: 23.9 % — ABNORMAL HIGH (ref 11.5–15.5)
WBC: 18.3 10*3/uL — ABNORMAL HIGH (ref 4.0–10.5)

## 2016-12-19 LAB — GLUCOSE, CAPILLARY
GLUCOSE-CAPILLARY: 129 mg/dL — AB (ref 65–99)
GLUCOSE-CAPILLARY: 157 mg/dL — AB (ref 65–99)
GLUCOSE-CAPILLARY: 167 mg/dL — AB (ref 65–99)
Glucose-Capillary: 166 mg/dL — ABNORMAL HIGH (ref 65–99)
Glucose-Capillary: 187 mg/dL — ABNORMAL HIGH (ref 65–99)
Glucose-Capillary: 178 mg/dL — ABNORMAL HIGH (ref 65–99)

## 2016-12-19 MED ORDER — POTASSIUM CHLORIDE 20 MEQ/15ML (10%) PO SOLN
40.0000 meq | ORAL | Status: AC
  Administered 2016-12-19 (×2): 40 meq
  Filled 2016-12-19 (×2): qty 30

## 2016-12-19 NOTE — Progress Notes (Addendum)
PULMONARY / CRITICAL CARE MEDICINE   Name: Danny Oneill MRN: [id number] DOB: 26-Feb-1932    ADMISSION DATE:  12/03/2016 CONSULTATION DATE:  12/08/2016  REFERRING MD:  OR   CHIEF COMPLAINT:  Hypotension  HISTORY OF PRESENT ILLNESS:   83 yo male admitted with Lt hip comminuted fracture after fall at home.  Developed agitated delirium while in hospital.  Had Lt intertrochanteric nailing on 9/04 with EBL 200 ml.  Noted to have urine retention.  Post op had hypotension, progressive anemia, and required reintubation.  Found to have E coli UTI.   PMHx of CAD, systolic CHF with EF 45 to 50%, HLD, PAF, CKD, Asbestosis exposure, BPH.  SUBJECTIVE:  More awake today morning LFTs slightly better  VITAL SIGNS: BP 107/84   Pulse 63   Temp 98.4 F (36.9 C) (Oral)   Resp 20   Ht  (1.854 m)   Wt 228 lb 13.4 oz (103.8 kg)   SpO2 95%   BMI 30.19 kg/m   INTAKE / OUTPUT:  Intake/Output Summary (Last 24 hours) at 12/19/16 0828 Last data filed at 12/19/16 0800  Gross per 24 hour  Intake           4189.9 ml  Output             1745 ml  Net           2444.9 ml   Gen:      No acute distress HEENT:  EOMI, icteric Neck:     No masses; no thyromegaly Lungs:    Clear to auscultation bilaterally; normal respiratory effort CV:         Regular rate and rhythm; no murmurs Abd:      + bowel sounds; soft, non-tender; no palpable masses, no distension Ext:    No edema; adequate peripheral perfusion Skin:      Warm and dry; no rash Neuro: Opens eyes, follows commands   LABS:  BMET  Recent Labs Lab 12/17/16 0420 12/18/16 0846 12/19/16 0518  NA 148* 146* 143  K 3.2* 3.2* 3.1*  CL 113* 113* 112*  CO2 BUN 67* 62* 57*  CREATININE 1.11 0.81 0.87  GLUCOSE 158* 166* 207*    Electrolytes  Recent Labs Lab 12/12/16 1732 12/13/16 0335 12/13/16 1739  12/17/16 0420 12/18/16 0846 12/19/16 0518  CALCIUM 7.9* 7.8*  --   < > 7.1* 7.0* 7.0*  MG 2.3 2.2 2.2  --   --   --   --    PHOS 2.5 1.6* 2.6  --   --   --   --   < > = values in this interval not displayed.  CBC  Recent Labs Lab 12/17/16 0420 12/18/16 0846 12/19/16 0518  WBC 19.6* 19.8* 18.3*  HGB 9.4* 9.4* 9.5*  HCT 29.5* 29.3* 29.3*  PLT 205 240 256    Coag's  Recent Labs Lab 12/15/16 0948 12/17/16 0420  APTT 33  --   INR 1.68 1.75    Sepsis Markers No results for input(s): LATICACIDVEN, PROCALCITON, O2SATVEN in the last 168 hours.  ABG No results for input(s): PHART, PCO2ART, PO2ART in the last 168 hours.  Liver Enzymes  Recent Labs Lab 12/17/16 0420 12/18/16 0534 12/18/16 0846 12/19/16 0518  AST 166*  --  144* 137*  ALT 84*  --  97* 95*  ALKPHOS 91  --  96 101  BILITOT 20.1* 19.0* 20.0* 18.6*  ALBUMIN 1.5*  --  1.4* 1.3*  Cardiac Enzymes No results for input(s): TROPONINI, PROBNP in the last 168 hours.  Glucose  Recent Labs Lab 12/18/16 1131 12/18/16 1539 12/18/16 2016 12/18/16 2346 12/19/16 0341 12/19/16 0736  GLUCAP 161* 156* 132* 109* 166* 157*    Imaging No results found.  STUDIES:  Abd u/s 9/08 >>No gallstones. There is slight sludge in the gallbladder and there is slight diffuse thickening of the gallbladder wall. Negative sonographic Murphy's sign.  CULTURES: 9/4 BC >> negative 9/4 UC >> E coli 9/11 blood culture>>>neg 9/12 perc drain >>>  ANTIBIOTICS: 9/4 ceftriaxone >> 9/8 9/8 ampicillin >> 9/10 Zosyn 9/10>>>9/12 ceftaz 9/12>>> PO vanco 9/12>  SIGNIFICANT EVENTS: 8/30 Admit 9/04  Surgery/ tx to ICU for hypotension 9/10: GI, surgical service and IR consulted   LINES/TUBES: 9/4 Left Radial Aline >>9/12 9/4 R TL IJ CVC >>  9/4 ETT >> 9/6 Perc GB drain 9/12>>  DISCUSSION: 83 yo with fall and hip fx.  Developed shock and VDRF after surgery from E coli UTI with sepsis and blood loss after surgery. -clinically continued to decline: WBC increasing, fever spike, tBilli still climbing, less responsive, still on pressors. Has + murphy's  sign, I felt like this is acute acalculous cholecystitis and we have since placed Perc drain on 9/11. Started oral vanc for C diff 9/12 & changed his abx to avoid hepatic toxicity.   ASSESSMENT / PLAN: Septic shock initially from E coli UTI; now with C diff Cultures pending from perc drain Plan Day 12 abx, ceftaz # 4.  Will stop at day 14  Wean levo  PMC Ck Cdiff; had positive PCR w/ indeterminate levels of toxin. Given abd pain, diarrhea & leukocytosis started Vanc 9/12 Plan Cont oral vanc (will extend 14d after other abx stopped)   Elevated LFTs. Liver failure with intrahepatic cholestasis from sepsis,  Elevated ammonia. Probable acute acalculous colecystitis  -abd [place] w/ GB sludge and diffuse thickening;  + murphy's sign  S/p perc drain 9/11; Jaundice -->bili finally dropping - stopped zosyn ? Hepatic toxicity  Plan Holding Lipitor Perc drain per IR (will need to be in for at least 6 weeks) Follow LFTs  Acute hypoxic respiratory failure. Hx of asbestos related pleural plaques. -extubated 8/6 Plan Wean O2, cont pulse ox and cont asp precautions  Hx of CAD s/p DES 02/2016, chronic combined CHF, HLD, PAF. Plan Off Brilenta; stopping asa as well d/t LFTs Cont tele   Acute renal failure >> improved/resolved Hx of BPH with urine retention. Hypernatremia,  and hyperchloremia -->Na improved some Hyperkalemia  Plan Cont D5W at 90 and free water flushes-->adjust as indicated Follow BMET  Dysphagia. Moderate protein calorie malnutrition. Plan Cont tubefeeds  ABLA. Thrombocytopenia. -stable Plan Trend cbc PAS  Acute metabolic encephalopathy. -likely multifactorial -->maybe a little better Plan No sedating meds Supportive care rx infection   Hyperglycemia Plan SSI coverage  Lt intertrochantric intramedullary nailing 12/08/16. Plan Per ortho  DVT prophylaxis - SCD SUP - Not indicated Nutrition - Tube feeds Goals of care - Full code  The patient is  critically ill with multiple organ system failure and requires high complexity decision making for assessment and support, frequent evaluation and titration of therapies, advanced monitoring, review of radiographic studies and interpretation of complex data.   Critical Care Time devoted to patient care services, exclusive of separately billable procedures, described in this note is 33 minutes.   Chilton Greathouse MD [place] [facility] Pager [phone] If no answer or after 3pm call:  [phone] 12/19/2016, 8:29 AM

## 2016-12-19 NOTE — Progress Notes (Signed)
eLink Physician-Brief Progress Note Patient Name: Danny Oneill DOB: 1931-09-09 MRN: [id number]   Date of Service  12/19/2016  HPI/Events of Note  hypokalemia  eICU Interventions  Potassium replaced     Intervention Category Intermediate Interventions: Electrolyte abnormality - evaluation and management  DETERDING,ELIZABETH 12/19/2016, 6:20 AM

## 2016-12-20 LAB — COMPREHENSIVE METABOLIC PANEL
ALT: 104 U/L — AB (ref 17–63)
ANION GAP: 4 — AB (ref 5–15)
AST: 131 U/L — ABNORMAL HIGH (ref 15–41)
Albumin: 1.4 g/dL — ABNORMAL LOW (ref 3.5–5.0)
Alkaline Phosphatase: 125 U/L (ref 38–126)
BUN: 46 mg/dL — ABNORMAL HIGH (ref 6–20)
CHLORIDE: 110 mmol/L (ref 101–111)
CO2: 26 mmol/L (ref 22–32)
CREATININE: 0.72 mg/dL (ref 0.61–1.24)
Calcium: 7.1 mg/dL — ABNORMAL LOW (ref 8.9–10.3)
Glucose, Bld: 162 mg/dL — ABNORMAL HIGH (ref 65–99)
POTASSIUM: 3.8 mmol/L (ref 3.5–5.1)
SODIUM: 140 mmol/L (ref 135–145)
Total Bilirubin: 17.2 mg/dL — ABNORMAL HIGH (ref 0.3–1.2)
Total Protein: 4.9 g/dL — ABNORMAL LOW (ref 6.5–8.1)

## 2016-12-20 LAB — CBC
HEMATOCRIT: 30.6 % — AB (ref 39.0–52.0)
Hemoglobin: 9.7 g/dL — ABNORMAL LOW (ref 13.0–17.0)
MCH: 30 pg (ref 26.0–34.0)
MCHC: 31.7 g/dL (ref 30.0–36.0)
MCV: 94.7 fL (ref 78.0–100.0)
Platelets: 279 10*3/uL (ref 150–400)
RBC: 3.23 MIL/uL — ABNORMAL LOW (ref 4.22–5.81)
RDW: 23.9 % — AB (ref 11.5–15.5)
WBC: 14.8 10*3/uL — ABNORMAL HIGH (ref 4.0–10.5)

## 2016-12-20 LAB — GLUCOSE, CAPILLARY
GLUCOSE-CAPILLARY: 150 mg/dL — AB (ref 65–99)
GLUCOSE-CAPILLARY: 132 mg/dL — AB (ref 65–99)
Glucose-Capillary: 128 mg/dL — ABNORMAL HIGH (ref 65–99)
Glucose-Capillary: 130 mg/dL — ABNORMAL HIGH (ref 65–99)
Glucose-Capillary: 135 mg/dL — ABNORMAL HIGH (ref 65–99)

## 2016-12-20 LAB — AEROBIC/ANAEROBIC CULTURE (SURGICAL/DEEP WOUND)

## 2016-12-20 LAB — AEROBIC/ANAEROBIC CULTURE W GRAM STAIN (SURGICAL/DEEP WOUND)
Culture: NO GROWTH
Gram Stain: NONE SEEN

## 2016-12-20 LAB — MAGNESIUM: MAGNESIUM: 1.9 mg/dL (ref 1.7–2.4)

## 2016-12-20 LAB — PHOSPHORUS: PHOSPHORUS: 3 mg/dL (ref 2.5–4.6)

## 2016-12-20 LAB — CORTISOL: CORTISOL PLASMA: 17.9 ug/dL

## 2016-12-20 MED ORDER — SODIUM CHLORIDE 0.9 % IV SOLN
INTRAVENOUS | Status: DC
  Administered 2016-12-20: 1000 mL via INTRAVENOUS
  Administered 2016-12-20: 20:00:00 via INTRAVENOUS

## 2016-12-20 MED ORDER — MIDODRINE HCL 5 MG PO TABS
5.0000 mg | ORAL_TABLET | Freq: Three times a day (TID) | ORAL | Status: DC
  Administered 2016-12-20 (×3): 5 mg via ORAL
  Filled 2016-12-20 (×5): qty 1

## 2016-12-20 NOTE — Progress Notes (Signed)
Arterial line not in place

## 2016-12-20 NOTE — Progress Notes (Signed)
Pharmacy Antibiotic Note  Danny Oneill is a 83 y.o. male admitted on 12/03/2016 with sepsis/cholecystitis and gallbladder abscess. Patient is on day 13/14 of antibiotics. WBC have trended down nicely and are now at 14.8. Patient is afebrile and liver function tests are improving. SCr is stable and patient has good urine output.   Plan: Continue Ceftazidime 2g IV every 8 hours.   Monitor renal function, culture results, and clinical status.  Likely stop ceftazidime tomorrow   Height: 6\' 1"  (185.4 cm) Weight: 231 lb 14.8 oz (105.2 kg) IBW/kg (Calculated) : 79.9  Temp (24hrs), Avg:98 F (36.7 C), Min:97.4 F (36.3 C), Max:98.5 F (36.9 C)   Recent Labs Lab 12/16/16 0420 12/16/16 0832 12/17/16 0420 12/18/16 0846 12/19/16 0518 12/20/16 0434  WBC 23.0*  --  19.6* 19.8* 18.3* 14.8*  CREATININE 0.95 1.01 1.11 0.81 0.87 0.72    Estimated Creatinine Clearance: 89.1 mL/min (by C-G formula based on SCr of 0.72 mg/dL).    Allergies  Allergen Reactions  . Amiodarone Rash  . Morphine And Related Other (See Comments)    Confusion/ hallucinations     Antimicrobials this admission: Ceftriaxone 9/4 >>9/8 Ampicillin 9/8 >>9/10 Zosyn 9/10 >> 9/12 Ceftazidime 9/12 >>(9/17) PO Vancomycin 9/12 >>  Dose adjustments this admission:   Microbiology results: 9/12 Cdiff Positive 9/11 Bile >>NGTD 9/4 Urine >> Ecoli (pan sensitive) 9/4 Blood x2 negative 8/30 MRSA PCR negative   Thank you for allowing pharmacy to be a part of this patient's care.  Sharin Mons, PharmD PGY2 Infectious Diseases Pharmacy Resident Clinical phone 12/20/2016 until 11PM [phone] After hours, please call #[phone] 12/20/2016 11:10 AM

## 2016-12-20 NOTE — Progress Notes (Addendum)
PULMONARY / CRITICAL CARE MEDICINE   Name: Danny Oneill MRN: [id number] DOB: [birth year]    ADMISSION DATE:  12/03/2016 CONSULTATION DATE:  12/08/2016  REFERRING MD:  OR   CHIEF COMPLAINT:  Hypotension  HISTORY OF PRESENT ILLNESS:   83 yo male admitted with Lt hip comminuted fracture after fall at home.  Developed agitated delirium while in hospital.  Had Lt intertrochanteric nailing on 9/04 with EBL 200 ml.  Noted to have urine retention.  Post op had hypotension, progressive anemia, and required reintubation.  Found to have E coli UTI.   PMHx of CAD, systolic CHF with EF 45 to 50%, HLD, PAF, CKD, Asbestosis exposure, BPH.  SUBJECTIVE:  More awake today morning LFTs are improving  VITAL SIGNS: BP 102/87 (BP Location: Right Arm)   Pulse 91   Temp 98.4 F (36.9 C) (Oral)   Resp 18   Ht 6\' 1"  (1.854 m)   Wt 231 lb 14.8 oz (105.2 kg)   SpO2 100%   BMI 30.60 kg/m   INTAKE / OUTPUT:  Intake/Output Summary (Last 24 hours) at 12/20/16 0745 Last data filed at 12/20/16 0600  Gross per 24 hour  Intake          4005.01 ml  Output             2400 ml  Net          1605.01 ml   Gen:      No acute distress HEENT:  EOMI, Icteric Neck:     No masses; no thyromegaly Lungs:    Clear to auscultation bilaterally; normal respiratory effort CV:         Regular rate and rhythm; no murmurs Abd:      + bowel sounds; soft, non-tender; no palpable masses, no distension Ext:    No edema; adequate peripheral perfusion Skin:      Warm and dry; no rash Neuro: Awake, minimally responsive  LABS:  BMET  Recent Labs Lab 12/18/16 0846 12/19/16 0518 12/20/16 0434  NA 146* 143 140  K 3.2* 3.1* 3.8  CL 113* 112* 110  CO2 26 27 26   BUN 62* 57* 46*  CREATININE 0.81 0.87 0.72  GLUCOSE 166* 207* 162*    Electrolytes  Recent Labs Lab 12/13/16 1739  12/18/16 0846 12/19/16 0518 12/20/16 0434  CALCIUM  --   < > 7.0* 7.0* 7.1*  MG 2.2  --   --   --  1.9  PHOS 2.6  --   --   --  3.0  <  > = values in this interval not displayed.  CBC  Recent Labs Lab 12/18/16 0846 12/19/16 0518 12/20/16 0434  WBC 19.8* 18.3* 14.8*  HGB 9.4* 9.5* 9.7*  HCT 29.3* 29.3* 30.6*  PLT 240 256 279    Coag's  Recent Labs Lab 12/15/16 0948 12/17/16 0420  APTT 33  --   INR 1.68 1.75    Sepsis Markers No results for input(s): LATICACIDVEN, PROCALCITON, O2SATVEN in the last 168 hours.  ABG No results for input(s): PHART, PCO2ART, PO2ART in the last 168 hours.  Liver Enzymes  Recent Labs Lab 12/18/16 0846 12/19/16 0518 12/20/16 0434  AST 144* 137* 131*  ALT 97* 95* 104*  ALKPHOS 96 101 125  BILITOT 20.0* 18.6* 17.2*  ALBUMIN 1.4* 1.3* 1.4*    Cardiac Enzymes No results for input(s): TROPONINI, PROBNP in the last 168 hours.  Glucose  Recent Labs Lab 12/19/16 0736 12/19/16 1142 12/19/16 1555 12/19/16 1928 12/19/16  2337 12/20/16 0425  GLUCAP 157* 167* 187* 178* 129* 150*    Imaging No results found.  STUDIES:  Abd u/s 9/08 >>No gallstones. There is slight sludge in the gallbladder and there is slight diffuse thickening of the gallbladder wall. Negative sonographic Murphy's sign.  CULTURES: 9/4 BC >> negative 9/4 UC >> E coli 9/11 blood culture>>>neg 9/12 perc drain >>>  ANTIBIOTICS: 9/4 ceftriaxone >> 9/8 9/8 ampicillin >> 9/10 Zosyn 9/10>>>9/12 ceftaz 9/12>>> PO vanco 9/12>  SIGNIFICANT EVENTS: 8/30 Admit 9/04  Surgery/ tx to ICU for hypotension 9/10: GI, surgical service and IR consulted   LINES/TUBES: 9/4 Left Radial Aline >>9/12 9/4 R TL IJ CVC >>  9/4 ETT >> 9/6 Perc GB drain 9/12>>  DISCUSSION: 83 yo with fall and hip fx.  Developed shock and VDRF after surgery from E coli UTI with sepsis and blood loss after surgery. -clinically continued to decline: WBC increasing, fever spike, tBilli still climbing, less responsive, still on pressors. Has + murphy's sign s/p Perc drain on 9/11. Started oral vanc for C diff 9/12 & changed his abx  to avoid hepatic toxicity.   ASSESSMENT / PLAN: Septic shock initially from E coli UTI; now with C diff Cultures pending from perc drain Plan Day 13 abx, ceftaz # 5  Will stop ceftaz at day 14 if Pct is improving. Continue oral vanc (will extend 14d after other abx stopped) Wean levo. Will give gentle fluids as CVP is sill low Add midodrine. Check cortisol to see if he needs stress dose steroids  Elevated LFTs. Liver failure with intrahepatic cholestasis from sepsis,  Elevated ammonia. Probable acute acalculous colecystitis  -abd [place] w/ GB sludge and diffuse thickening;  + murphy's sign  S/p perc drain 9/11; Jaundice -->bili finally dropping - stopped zosyn ? Hepatic toxicity  Plan Holding Lipitor Perc drain per IR (will need to be in for at least 6 weeks) Follow LFTs  Acute hypoxic respiratory failure. Hx of asbestos related pleural plaques. -extubated 8/6 Plan Wean O2, cont pulse ox and cont asp precautions  Hx of CAD s/p DES 02/2016, chronic combined CHF, HLD, PAF. Plan Off Brilenta due to elevated LFTs Resume aspirin Cont tele   Acute renal failure >> improved/resolved Hx of BPH with urine retention. Hypernatremia,  and hyperchloremia -->Na improved some Hyperkalemia  Plan Free water flushes-->adjust as indicated Stop D5 Follow BMET  Dysphagia. Moderate protein calorie malnutrition. Plan Cont tubefeeds  ABLA. Thrombocytopenia. Improved Plan Follow CBC  Acute metabolic encephalopathy. -likely multifactorial -->maybe a little better Plan No sedating meds Supportive care  Hyperglycemia Plan SSI coverage  Lt intertrochantric intramedullary nailing 12/08/16. Plan Per ortho  DVT prophylaxis - SCD. Start SQ heaprin SUP - Not indicated Nutrition - Tube feeds Goals of care - Full code  The patient is critically ill with multiple organ system failure and requires high complexity decision making for assessment and support, frequent evaluation and  titration of therapies, advanced monitoring, review of radiographic studies and interpretation of complex data.   Critical Care Time devoted to patient care services, exclusive of separately billable procedures, described in this note is 33 minutes.   Chilton Greathouse MD [facility] Pager [phone] If no answer or after 3pm call: [phone] 12/20/2016, 7:45 AM

## 2016-12-21 ENCOUNTER — Inpatient Hospital Stay (HOSPITAL_COMMUNITY): Payer: PPO

## 2016-12-21 DIAGNOSIS — N183 Chronic kidney disease, stage 3 (moderate): Secondary | ICD-10-CM

## 2016-12-21 LAB — COMPREHENSIVE METABOLIC PANEL
ALT: 108 U/L — AB (ref 17–63)
ANION GAP: 4 — AB (ref 5–15)
AST: 134 U/L — ABNORMAL HIGH (ref 15–41)
Albumin: 1.3 g/dL — ABNORMAL LOW (ref 3.5–5.0)
Alkaline Phosphatase: 139 U/L — ABNORMAL HIGH (ref 38–126)
BUN: 41 mg/dL — ABNORMAL HIGH (ref 6–20)
CALCIUM: 7 mg/dL — AB (ref 8.9–10.3)
CHLORIDE: 111 mmol/L (ref 101–111)
CO2: 26 mmol/L (ref 22–32)
CREATININE: 0.73 mg/dL (ref 0.61–1.24)
Glucose, Bld: 173 mg/dL — ABNORMAL HIGH (ref 65–99)
Potassium: 4 mmol/L (ref 3.5–5.1)
Sodium: 141 mmol/L (ref 135–145)
Total Bilirubin: 14 mg/dL — ABNORMAL HIGH (ref 0.3–1.2)
Total Protein: 4.9 g/dL — ABNORMAL LOW (ref 6.5–8.1)

## 2016-12-21 LAB — GLUCOSE, CAPILLARY
GLUCOSE-CAPILLARY: 111 mg/dL — AB (ref 65–99)
GLUCOSE-CAPILLARY: 143 mg/dL — AB (ref 65–99)
GLUCOSE-CAPILLARY: 185 mg/dL — AB (ref 65–99)
Glucose-Capillary: 148 mg/dL — ABNORMAL HIGH (ref 65–99)
Glucose-Capillary: 139 mg/dL — ABNORMAL HIGH (ref 65–99)
Glucose-Capillary: 155 mg/dL — ABNORMAL HIGH (ref 65–99)
Glucose-Capillary: 163 mg/dL — ABNORMAL HIGH (ref 65–99)

## 2016-12-21 LAB — CBC
HCT: 29.1 % — ABNORMAL LOW (ref 39.0–52.0)
HEMOGLOBIN: 9.3 g/dL — AB (ref 13.0–17.0)
MCH: 30.7 pg (ref 26.0–34.0)
MCHC: 32 g/dL (ref 30.0–36.0)
MCV: 96 fL (ref 78.0–100.0)
PLATELETS: 309 10*3/uL (ref 150–400)
RBC: 3.03 MIL/uL — AB (ref 4.22–5.81)
RDW: 25 % — AB (ref 11.5–15.5)
WBC: 12.1 10*3/uL — AB (ref 4.0–10.5)

## 2016-12-21 LAB — PHOSPHORUS: PHOSPHORUS: 2.9 mg/dL (ref 2.5–4.6)

## 2016-12-21 LAB — MAGNESIUM: Magnesium: 1.8 mg/dL (ref 1.7–2.4)

## 2016-12-21 MED ORDER — MIDODRINE HCL 5 MG PO TABS
5.0000 mg | ORAL_TABLET | Freq: Three times a day (TID) | ORAL | Status: DC
  Administered 2016-12-21 – 2016-12-22 (×3): 5 mg via ORAL
  Filled 2016-12-21 (×4): qty 1

## 2016-12-21 MED ORDER — HYDROCORTISONE NA SUCCINATE PF 100 MG IJ SOLR
50.0000 mg | Freq: Four times a day (QID) | INTRAMUSCULAR | Status: DC
  Administered 2016-12-21 – 2016-12-28 (×29): 50 mg via INTRAVENOUS
  Filled 2016-12-21: qty 1
  Filled 2016-12-21 (×4): qty 2
  Filled 2016-12-21 (×3): qty 1
  Filled 2016-12-21: qty 2
  Filled 2016-12-21 (×3): qty 1
  Filled 2016-12-21 (×2): qty 2
  Filled 2016-12-21: qty 1
  Filled 2016-12-21 (×4): qty 2
  Filled 2016-12-21 (×2): qty 1
  Filled 2016-12-21 (×9): qty 2

## 2016-12-21 MED ORDER — SODIUM CHLORIDE 0.45 % IV SOLN
INTRAVENOUS | Status: DC
  Administered 2016-12-21: 10:00:00 via INTRAVENOUS

## 2016-12-21 MED ORDER — FLUDROCORTISONE ACETATE 0.1 MG PO TABS
0.1000 mg | ORAL_TABLET | Freq: Every day | ORAL | Status: DC
  Administered 2016-12-21 – 2016-12-28 (×8): 0.1 mg
  Filled 2016-12-21 (×9): qty 1

## 2016-12-21 MED ORDER — FLUDROCORTISONE 0.1 MG/ML ORAL SUSPENSION
0.1000 mg | Freq: Every day | ORAL | Status: DC
  Filled 2016-12-21 (×2): qty 1

## 2016-12-21 NOTE — Progress Notes (Addendum)
PULMONARY / CRITICAL CARE MEDICINE   Name: Danny Oneill MRN: [id number] DOB: June 24, 1931    ADMISSION DATE:  12/03/2016 CONSULTATION DATE:  12/08/2016  REFERRING MD:  OR   CHIEF COMPLAINT:  Hypotension  HISTORY OF PRESENT ILLNESS:   83 yo male admitted with Lt hip comminuted fracture after fall at home.  Developed agitated delirium while in hospital.  Had Lt intertrochanteric nailing on 9/04 with EBL 200 ml.  Noted to have urine retention.  Post op had hypotension, progressive anemia, and required reintubation.  Found to have E coli UTI.   PMHx of CAD, systolic CHF with EF 45 to 50%, HLD, PAF, CKD, Asbestosis exposure, BPH.  SUBJECTIVE:  Remains in shock Levo at 6 No distress  VITAL SIGNS: BP (!) 86/58   Pulse 81   Temp 98.3 F (36.8 C) (Oral)   Resp (!) 22   Ht 6\' 1"  (1.854 m)   Wt 108.6 kg (239 lb 6.7 oz)   SpO2 98%   BMI 31.59 kg/m   INTAKE / OUTPUT:  Intake/Output Summary (Last 24 hours) at 12/21/16 0859 Last data filed at 12/21/16 0700  Gross per 24 hour  Intake           4250.6 ml  Output             1950 ml  Net           2300.6 ml   General: awake, no distress Neuro: weak, perr, diffuse weak, equal HEENT: jvd wnl PULM: reduced CV:  s1 s2 RRR no r GI: soft, BS wnl, no r/f , less tender Extremities: no rash, edema present, 3   LABS:  BMET  Recent Labs Lab 12/19/16 0518 12/20/16 0434 12/21/16 0400  NA 143 140 141  K 3.1* 3.8 4.0  CL 112* 110 111  CO2 27 26 26   BUN 57* 46* 41*  CREATININE 0.87 0.72 0.73  GLUCOSE 207* 162* 173*    Electrolytes  Recent Labs Lab 12/19/16 0518 12/20/16 0434 12/21/16 0400  CALCIUM 7.0* 7.1* 7.0*  MG  --  1.9 1.8  PHOS  --  3.0 2.9    CBC  Recent Labs Lab 12/19/16 0518 12/20/16 0434 12/21/16 0400  WBC 18.3* 14.8* 12.1*  HGB 9.5* 9.7* 9.3*  HCT 29.3* 30.6* 29.1*  PLT 256 279 309    Coag's  Recent Labs Lab 12/15/16 0948 12/17/16 0420  APTT 33  --   INR 1.68 1.75    Sepsis Markers No  results for input(s): LATICACIDVEN, PROCALCITON, O2SATVEN in the last 168 hours.  ABG No results for input(s): PHART, PCO2ART, PO2ART in the last 168 hours.  Liver Enzymes  Recent Labs Lab 12/19/16 0518 12/20/16 0434 12/21/16 0400  AST 137* 131* 134*  ALT 95* 104* 108*  ALKPHOS 101 125 139*  BILITOT 18.6* 17.2* 14.0*  ALBUMIN 1.3* 1.4* 1.3*    Cardiac Enzymes No results for input(s): TROPONINI, PROBNP in the last 168 hours.  Glucose  Recent Labs Lab 12/20/16 0737 12/20/16 1149 12/20/16 1654 12/20/16 1928 12/21/16 0009 12/21/16 0348  GLUCAP 132* 128* 130* 135* 111* 148*    Imaging Dg Chest Port 1 View  Result Date: 12/21/2016 CLINICAL DATA:  Acute respiratory failure EXAM: PORTABLE CHEST 1 VIEW COMPARISON:  12/15/2016 FINDINGS: Calcified pleural plaques bilaterally. Right central line and feeding tube are unchanged. Bilateral parenchymal scarring changes are stable. Heart is upper limits normal in size. No definite visible significant effusions. IMPRESSION: Stable pleuroparenchymal changes most compatible with asbestosis. No  change. Electronically Signed   By: Charlett Nose M.D.   On: 12/21/2016 07:17    STUDIES:  Abd u/s 9/08 >>No gallstones. There is slight sludge in the gallbladder and there is slight diffuse thickening of the gallbladder wall. Negative sonographic Murphy's sign.  CULTURES: 9/4 BC >> negative 9/4 UC >> E coli 9/11 blood culture>>>neg 9/12 perc drain >>>  ANTIBIOTICS: 9/4 ceftriaxone >> 9/8 9/8 ampicillin >> 9/10 Zosyn 9/10>>>9/12 ceftaz 9/12>>>9/17 PO vanco 9/12> stop date is 10 days pat 9/17  SIGNIFICANT EVENTS: 8/30 Admit 9/04  Surgery/ tx to ICU for hypotension 9/10: GI, surgical service and IR consulted   LINES/TUBES: 9/4 Left Radial Aline >>9/12 9/4 R TL IJ CVC >>  9/4 ETT >> 9/6 Perc GB drain 9/12>>  DISCUSSION: 83 yo with fall and hip fx.  Developed shock and VDRF after surgery from E coli UTI with sepsis and blood loss  after surgery. -clinically continued to decline: WBC increasing, fever spike, tBilli still climbing, less responsive, still on pressors. Has + murphy's sign s/p Perc drain on 9/11. Started oral vanc for C diff 9/12 & changed his abx to avoid hepatic toxicity.   ASSESSMENT / PLAN: Septic shock initially from E coli UTI; now with C diff Cultures pending from perc drain Plan allow ceftaz to dc, course completed Limit empiric abx course in cdiff Oral vanc, add stop date 10 days past empiric course  SHOCK Rel AI Levophed to MAP goal 55 with good MS Pos balance daily, avoid further bolus cvp assessment Stress steroids add Florinef add   Elevated LFTs. Liver failure with intrahepatic cholestasis from sepsis,  Elevated ammonia. Probable acute acalculous colecystitis  -abd [place] w/ GB sludge and diffuse thickening;  + murphy's sign  S/p perc drain 9/11; Jaundice -->bili finally dropping - stopped zosyn ? Hepatic toxicity  Plan Perc drain per IR (will need to be in for at least 6 weeks) Follow LFTsT remain goal   Acute hypoxic respiratory failure. Hx of asbestos related pleural plaques. -extubated 8/6 Plan On RA pcxr is chronic May need lasix soon, may have some edema on top  Hx of CAD s/p DES 02/2016, chronic combined CHF, HLD, PAF. Plan Off Brilenta due to elevated LFTs aspirin Cont tele   Acute renal failure >> improved/resolved Hx of BPH with urine retention. Hypernatremia,  and hyperchloremia -->Na improved some Hyperkalemia  Plan Dc all free water Dc d5 kvo 1/2 NS  Chem in am   Dysphagia. Moderate protein calorie malnutrition. Plan Cont tubefeeds SLp when more awake ABLA. Thrombocytopenia. Improved Plan Follow CBC  Acute metabolic encephalopathy. -likely multifactorial -->maybe a little better Plan No sedating meds Supportive care Lactulose soon when stool volume down  Hyperglycemia Plan SSI coverage  Lt intertrochantric intramedullary nailing  12/08/16. Plan Per ortho  Get duplex upper rt  Ccm time 30 min  I updated family in full  Mcarthur Rossetti. Tyson Alias, MD, FACP Pgr: [phone]  Pulmonary & Critical Care

## 2016-12-21 NOTE — Progress Notes (Signed)
   Subjective:  Continues to be somnolent.  Nonresponsive to questioning.  Per family members and nursing he remained stable critically ill.  Objective:   VITALS:   Vitals:   12/21/16 1400 12/21/16 1415 12/21/16 1430 12/21/16 1549  BP: (!) 87/63 (!) 96/53 (!) 89/52   Pulse: 71 69 68   Resp: 19 18 18    Temp:    97.7 F (36.5 C)  TempSrc:    Axillary  SpO2: 100% 100% 100%   Weight:      Height:       General: Jaundice with anasarca.  Left leg exam:  Incision about the hip on the lateral aspect are clean dry and intact. There is no drainage at this time.Bandages removed with staples clean dry and intact. Thigh is soft and nontender. Skin is warm and well-perfused. He is unable to provide a focal examination but does spontaneously withdrawal to pain throughout the left leg. As a 1+ dorsalis pedis pulse. His calf is soft.  He does  respond to passive stretch and a painful manner.  He has pitting edema throughout the entire leg.  This is symmetric with the contralateral leg  Lab Results  Component Value Date   WBC 12.1 (H) 12/21/2016   HGB 9.3 (L) 12/21/2016   HCT 29.1 (L) 12/21/2016   MCV 96.0 12/21/2016   PLT 309 12/21/2016   BMET    Component Value Date/Time   NA 141 12/21/2016 0400   NA 132 (A) 02/17/2016   K 4.0 12/21/2016 0400   CL 111 12/21/2016 0400   CO2 26 12/21/2016 0400   GLUCOSE 173 (H) 12/21/2016 0400   BUN 41 (H) 12/21/2016 0400   BUN 37 (A) 02/17/2016   CREATININE 0.73 12/21/2016 0400   CREATININE 1.25 (H) 03/16/2016 1229   CALCIUM 7.0 (L) 12/21/2016 0400   GFRNONAA >60 12/21/2016 0400   GFRAA >60 12/21/2016 0400     Assessment/Plan: 13 Days Post-Op   Principal Problem:   Closed left hip fracture (HCC) Active Problems:   Hyperlipidemia   CAD (coronary artery disease)   CKD (chronic kidney disease), stage III   Hip fracture (HCC)   Compromised airway   Hypovolemic shock (HCC)   Elevated troponin I level   Chronic combined systolic and diastolic  heart failure (HCC)   Respiratory failure (HCC)   - Continue critical care per the ICU.  Prognosis appears guarded with little improvement following days of critical care.  - Aspirin and SCDs for DVT prophylaxis.  - He is appropriate for 50% weightbearing to the left lower extremity once cleared from a critical-care standpoint.   -We will plan for staple removal later this week.  Yolonda Kida 12/21/2016, 4:00 PM   Maryan Rued, MD [phone]

## 2016-12-22 ENCOUNTER — Inpatient Hospital Stay (HOSPITAL_COMMUNITY): Payer: PPO

## 2016-12-22 DIAGNOSIS — R609 Edema, unspecified: Secondary | ICD-10-CM

## 2016-12-22 DIAGNOSIS — R7989 Other specified abnormal findings of blood chemistry: Secondary | ICD-10-CM

## 2016-12-22 DIAGNOSIS — R945 Abnormal results of liver function studies: Secondary | ICD-10-CM

## 2016-12-22 LAB — GLUCOSE, CAPILLARY
GLUCOSE-CAPILLARY: 174 mg/dL — AB (ref 65–99)
GLUCOSE-CAPILLARY: 190 mg/dL — AB (ref 65–99)
GLUCOSE-CAPILLARY: 205 mg/dL — AB (ref 65–99)
Glucose-Capillary: 207 mg/dL — ABNORMAL HIGH (ref 65–99)
Glucose-Capillary: 152 mg/dL — ABNORMAL HIGH (ref 65–99)
Glucose-Capillary: 156 mg/dL — ABNORMAL HIGH (ref 65–99)

## 2016-12-22 LAB — BASIC METABOLIC PANEL
ANION GAP: 5 (ref 5–15)
BUN: 52 mg/dL — AB (ref 6–20)
CHLORIDE: 111 mmol/L (ref 101–111)
CO2: 23 mmol/L (ref 22–32)
Calcium: 7.3 mg/dL — ABNORMAL LOW (ref 8.9–10.3)
Creatinine, Ser: 0.85 mg/dL (ref 0.61–1.24)
GFR calc Af Amer: >60 mL/min (ref >60)
GFR calc non Af Amer: >60 mL/min (ref >60)
GLUCOSE: 229 mg/dL — AB (ref 65–99)
POTASSIUM: 4 mmol/L (ref 3.5–5.1)
SODIUM: 139 mmol/L (ref 135–145)

## 2016-12-22 LAB — PROTIME-INR
INR: 1.5
PROTHROMBIN TIME: 17.9 s — AB (ref 11.4–15.2)

## 2016-12-22 LAB — APTT: APTT: 37 s — AB (ref 24–36)

## 2016-12-22 MED ORDER — INSULIN GLARGINE 100 UNIT/ML ~~LOC~~ SOLN
5.0000 [IU] | Freq: Every day | SUBCUTANEOUS | Status: DC
  Administered 2016-12-22: 5 [IU] via SUBCUTANEOUS
  Filled 2016-12-22: qty 0.05

## 2016-12-22 MED ORDER — MIDODRINE HCL 5 MG PO TABS
10.0000 mg | ORAL_TABLET | Freq: Three times a day (TID) | ORAL | Status: DC
  Administered 2016-12-22 – 2016-12-23 (×3): 10 mg via ORAL
  Filled 2016-12-22 (×7): qty 2

## 2016-12-22 MED ORDER — NOREPINEPHRINE BITARTRATE 1 MG/ML IV SOLN: 0.0000 ug/min | INTRAVENOUS | Status: DC

## 2016-12-22 MED ORDER — FUROSEMIDE 10 MG/ML IJ SOLN
40.0000 mg | Freq: Once | INTRAMUSCULAR | Status: AC
  Administered 2016-12-22: 40 mg via INTRAVENOUS
  Filled 2016-12-22: qty 4

## 2016-12-22 MED ORDER — LACTULOSE 10 GM/15ML PO SOLN
30.0000 g | Freq: Two times a day (BID) | ORAL | Status: DC
  Administered 2016-12-22 (×2): 30 g via ORAL
  Filled 2016-12-22 (×3): qty 45

## 2016-12-22 NOTE — Care Management Note (Signed)
Case Management Note  Patient Details  Name: Danny Oneill MRN: [id number] Date of Birth: 03/15/1932  Subjective/Objective:              Patient from home w wife, admitted after falling at restaurant. Ortho consult states Dr. Aundria Rud will do a Left Hip nailing 8/31. Pt eval after sx, mat need SNF. CM and CSW will continue to follow.      Action/Plan:   Expected Discharge Date:                  Expected Discharge Plan:     In-House Referral:  Clinical Social Work  Discharge planning Services  CM Consult  Post Acute Care Choice:    Choice offered to:     DME Arranged:    DME Agency:     HH Arranged:    HH Agency:     Status of Service:  In process, will continue to follow  If discussed at Long Length of Stay Meetings, dates discussed:    Additional Comments: 12/22/2016  Discussed pt in LOS 9/18 - pt remains appropriate for continued stay.  Pt remains on pressor.  Pt will need PT eval once stable.  CM will continue to follow for discharge needs  12/15/16 Discussed in LOS 12/17/16 - pt remains appropriate for continued stay.  Pt remains on norepi drip, has Gallbladder tube, on tube feeds and IV antibiotics   12/22/2016  Discussed in LOS 9/11 - pt remains appropriate for continued stay.  Pt remains on vasopressors, liver functioning worsening, septic shock in need of perc drain. CM will continue to follow for discharge needs  Cherylann Parr, RN 12/22/2016, 2:48 PM

## 2016-12-22 NOTE — Progress Notes (Signed)
Patient ID: Danny Oneill, male   DOB: February 27, 1932, 83 y.o.   MRN: [id number]    Referring Physician(s): Praveen Mannam   Supervising Physician: Richarda Overlie  Patient Status: [facility] - In-pt  Chief Complaint: Hyperbilirubinemia   Subjective: Patient alert, but remains critically ill and with altered mental status.   Allergies: Amiodarone and Morphine and related  Medications: Prior to Admission medications   Medication Sig Start Date End Date Taking? Authorizing Provider  aspirin EC 81 MG tablet Take 81 mg by mouth daily with breakfast.    Yes [provider]  atorvastatin (LIPITOR) 20 MG tablet Take 1 tablet (20 mg total) by mouth daily. 04/07/16  Yes Lennette Bihari, MD  carvedilol (COREG) 3.125 MG tablet Take 3.125 mg by mouth 2 (two) times daily. 11/18/16  Yes [provider]  furosemide (LASIX) 20 MG tablet Take 1 tablet by mouth daily and ok to use extra tablet as needed for swelling. 10/14/16  Yes Cardama, Amadeo Garnet, MD  lisinopril (PRINIVIL,ZESTRIL) 2.5 MG tablet Take 1 tablet (2.5 mg total) by mouth daily. 09/21/16 12/20/16 Yes Lennette Bihari, MD  tamsulosin (FLOMAX) 0.4 MG CAPS capsule TAKE 1 CAPSULE(0.4 MG) BY MOUTH DAILY AFTER SUPPER 04/01/16  Yes Dunn, Tacey Ruiz, PA-C  ticagrelor (BRILINTA) 90 MG TABS tablet Take 1 tablet (90 mg total) by mouth 2 (two) times daily. 04/07/16  Yes Lennette Bihari, MD  carvedilol (COREG) 6.25 MG tablet Take 1 tablet (6.25 mg total) by mouth 2 (two) times daily. Patient not taking: Reported on 12/03/2016 04/28/16   Lennette Bihari, MD    Vital Signs: BP (!) 85/68   Pulse 60   Temp (!) 96.3 F (35.7 C) (Axillary)   Resp (!) 22   Ht  (1.854 m)   Wt 237 lb 10.5 oz (107.8 kg)   SpO2 97%   BMI 31.35 kg/m   Physical Exam: Abd: soft, drain in place with bilious output.  Insertion site is c/d/i.    Imaging: Dg Chest Port 1 View  Result Date: 12/21/2016 CLINICAL DATA:  Acute respiratory failure EXAM: PORTABLE CHEST 1 VIEW  COMPARISON:  12/15/2016 FINDINGS: Calcified pleural plaques bilaterally. Right central line and feeding tube are unchanged. Bilateral parenchymal scarring changes are stable. Heart is upper limits normal in size. No definite visible significant effusions. IMPRESSION: Stable pleuroparenchymal changes most compatible with asbestosis. No change. Electronically Signed   By: Charlett Nose M.D.   On: 12/21/2016 07:17   US Abdomen Limited Ruq  Result Date: 12/22/2016 CLINICAL DATA:  83 year old male with elevated transaminases. Percutaneous cholecystotomy. Subsequent encounter. EXAM: ULTRASOUND ABDOMEN LIMITED RIGHT UPPER QUADRANT COMPARISON:  12/17/2016 plain film examination. 12/15/2016 cholecystogram. 12/12/2016 ultrasound. FINDINGS: Gallbladder: Cholecystostomy tube in place. Gallbladder sludge. Gallbladder wall thickening measuring up to 5.1 mm. Ultrasonographer was not able to determine if the patient was tender over this region secondary to bandage. Common bile duct: Diameter: 5.4 mm. Liver: No hepatic lesion or intrahepatic biliary duct dilation. Portal vein is patent on color Doppler imaging with normal direction of blood flow towards the liver. Ascites.  Right-sided pleural effusion. IMPRESSION: Cholecystostomy tube in place. Gallbladder sludge. Gallbladder wall thickening measuring up to 5.1 mm. Ultrasonographer was not able to determine if the patient was tender over this region secondary to bandage. No hepatic lesion or intrahepatic biliary duct dilation. Ascites. Right-sided pleural effusion. Electronically Signed   By: Lacy Duverney M.D.   On: 12/22/2016 12:34    Labs:  CBC:  Recent Labs  12/18/16 0846 12/19/16 0518 12/20/16 0434 12/21/16 0400  WBC 19.8* 18.3* 14.8* 12.1*  HGB 9.4* 9.5* 9.7* 9.3*  HCT 29.3* 29.3* 30.6* 29.1*  PLT 240 256 279 309    COAGS:  Recent Labs  02/02/16 2100 12/08/16 2301 12/15/16 0948 12/17/16 0420 12/22/16 0357  INR 1.29 2.34 1.68 1.75 1.50  APTT 81*   --  33  --  37*    BMP:  Recent Labs  12/19/16 0518 12/20/16 0434 12/21/16 0400 12/22/16 0357  NA 143 140 141 139  K 3.1* 3.8 4.0 4.0  CL 112* 110 111 111  CO2 27 26 26 23   GLUCOSE 207* 162* 173* 229*  BUN 57* 46* 41* 52*  CALCIUM 7.0* 7.1* 7.0* 7.3*  CREATININE 0.87 0.72 0.73 0.85  GFRNONAA >60 >60 >60 >60  GFRAA >60 >60 >60 >60    LIVER FUNCTION TESTS:  Recent Labs  12/18/16 0846 12/19/16 0518 12/20/16 0434 12/21/16 0400  BILITOT 20.0* 18.6* 17.2* 14.0*  AST 144* 137* 131* 134*  ALT 97* 95* 104* 108*  ALKPHOS 96 101 125 139*  PROT 4.5* 4.7* 4.9* 4.9*  ALBUMIN 1.4* 1.3* 1.4* 1.3*    Assessment and Plan: Hyperbilirubinemia s/p cholecystostomy placement 9/11 Abdominal pain attributed to C. Diff infection.  Tbili slowly improving, now 14 Discussed with family again today- tube to remain in place for minimum of 6 weeks.  Will discuss merits of capping with MD.  IR to follow.    Electronically Signed: Hoyt Koch 12/22/2016, 5:24 PM   I spent a total of 15 Minutes at the the patient's bedside AND on the patient's hospital floor or unit, greater than 50% of which was counseling/coordinating care for hyperbilirubinemia

## 2016-12-22 NOTE — Progress Notes (Signed)
Preliminary results by tech - Venous Duplex Upper Ext. Completed. Negative for deep vein thrombosis in both arms. There is a localized superficial clot in the left cephalic vein at the antecubital fossa level. Marilynne Halsted, BS, RDMS, RVT

## 2016-12-22 NOTE — Progress Notes (Signed)
PULMONARY / CRITICAL CARE MEDICINE   Name: Danny Oneill MRN: [id number] DOB: May 22, 1931    ADMISSION DATE:  12/03/2016 CONSULTATION DATE:  12/08/2016  REFERRING MD:  OR   CHIEF COMPLAINT:  Hypotension  HISTORY OF PRESENT ILLNESS:   83 yo male admitted with Lt hip comminuted fracture after fall at home.  Developed agitated delirium while in hospital.  Had Lt intertrochanteric nailing on 9/04 with EBL 200 ml.  Noted to have urine retention.  Post op had hypotension, progressive anemia, and required reintubation.  Found to have E coli UTI.   PMHx of CAD, systolic CHF with EF 45 to 50%, HLD, PAF, CKD, Asbestosis exposure, BPH.  SUBJECTIVE:  Remained on low dose pressors this am  No distress  VITAL SIGNS: BP (!) 74/47 (BP Location: Left Arm)   Pulse (!) 56   Temp 97.6 F (36.4 C) (Oral)   Resp 19   Ht  (1.854 m)   Wt 107.8 kg (237 lb 10.5 oz)   SpO2 100%   BMI 31.35 kg/m   INTAKE / OUTPUT:  Intake/Output Summary (Last 24 hours) at 12/22/16 0909 Last data filed at 12/22/16 0800  Gross per 24 hour  Intake          1877.95 ml  Output             2410 ml  Net          -532.05 ml   General: weak, awake Neuro: some confusion, moves all etx, per HEENT: perl, jvd wnl or higher PULM: coarse CV:  s1 s2 RRR GI: soft, BS wnl to low, drain wnl Extremities:  Edema noted    LABS:  BMET  Recent Labs Lab 12/20/16 0434 12/21/16 0400 12/22/16 0357  NA 140 141 139  K 3.8 4.0 4.0  CL 110 111 111  CO2 BUN 46* 41* 52*  CREATININE 0.72 0.73 0.85  GLUCOSE 162* 173* 229*    Electrolytes  Recent Labs Lab 12/20/16 0434 12/21/16 0400 12/22/16 0357  CALCIUM 7.1* 7.0* 7.3*  MG 1.9 1.8  --   PHOS 3.0 2.9  --     CBC  Recent Labs Lab 12/19/16 0518 12/20/16 0434 12/21/16 0400  WBC 18.3* 14.8* 12.1*  HGB 9.5* 9.7* 9.3*  HCT 29.3* 30.6* 29.1*  PLT 256 279 309    Coag's  Recent Labs Lab 12/15/16 0948 12/17/16 0420 12/22/16 0357  APTT 33  --   37*  INR 1.68 1.75 1.50    Sepsis Markers No results for input(s): LATICACIDVEN, PROCALCITON, O2SATVEN in the last 168 hours.  ABG No results for input(s): PHART, PCO2ART, PO2ART in the last 168 hours.  Liver Enzymes  Recent Labs Lab 12/19/16 0518 12/20/16 0434 12/21/16 0400  AST 137* 131* 134*  ALT 95* 104* 108*  ALKPHOS 101 125 139*  BILITOT 18.6* 17.2* 14.0*  ALBUMIN 1.3* 1.4* 1.3*    Cardiac Enzymes No results for input(s): TROPONINI, PROBNP in the last 168 hours.  Glucose  Recent Labs Lab 12/21/16 1128 12/21/16 1532 12/21/16 2006 12/21/16 2359 12/22/16 0405 12/22/16 0836  GLUCAP 155* 163* 143* 185* 207* 174*    Imaging No results found.  STUDIES:  Abd u/s 9/08 >>No gallstones. There is slight sludge in the gallbladder and there is slight diffuse thickening of the gallbladder wall. Negative sonographic Murphy's sign.  CULTURES: 9/4 BC >> negative 9/4 UC >> E coli 9/11 blood culture>>>neg 9/12 perc drain >>>  ANTIBIOTICS: 9/4 ceftriaxone >> 9/8  9/8 ampicillin >> 9/10 Zosyn 9/10>>>9/12 ceftaz 9/12>>>9/17 PO vanco 9/12> stop date is 10 days pat 9/17  SIGNIFICANT EVENTS: 8/30 Admit 9/04  Surgery/ tx to ICU for hypotension 9/10: GI, surgical service and IR consulted   LINES/TUBES: 9/4 Left Radial Aline >>9/12 9/4 R TL IJ CVC >>  9/4 ETT >> 9/6 Perc GB drain 9/12>>  DISCUSSION: 83 yo with fall and hip fx.  Developed shock and VDRF after surgery from E coli UTI with sepsis and blood loss after surgery. -clinically continued to decline: WBC increasing, fever spike, tBilli still climbing, less responsive, still on pressors. Has + murphy's sign s/p Perc drain on 9/11. Started oral vanc for C diff 9/12 & changed his abx to avoid hepatic toxicity.   ASSESSMENT / PLAN: Septic shock initially from E coli UTI; now with C diff Cultures pending from perc drain Plan Oral vanc for 2 weeks  Follow abdo symptoms, fever curve , output  SHOCK Rel  AI Levophed to MAP goal 55 with good MS and urine output ( just put out 200 cc) Edema, when off pressors consider lasix Stress steroids , maintain , reduce slowly over 4-7 days Florinef keep  Elevated LFTs. Liver failure with intrahepatic cholestasis from sepsis,  Elevated ammonia. Probable acute acalculous colecystitis  -abd [place] w/ GB sludge and diffuse thickening;  + murphy's sign  S/p perc drain 9/11; Jaundice -->bili finally dropping - stopped zosyn ? Hepatic toxicity  Plan Perc drain, follow output Follow LFTsT remain goal in am for bili  Acute hypoxic respiratory failure. Hx of asbestos related pleural plaques. -extubated 8/6 Plan On RA pcxr is chronic Lasix when able IS when able  Hx of CAD s/p DES 02/2016, chronic combined CHF, HLD, PAF. Plan Off Brilenta due to elevated LFTs aspirin Cont tele   Acute renal failure >> improved/resolved Hx of BPH with urine retention. Hypernatremia,  and hyperchloremia -->Na improved some Hyperkalemia  Plan Lasix x 1 and assess response kvo Lytes in am   Dysphagia. Moderate protein calorie malnutrition. Plan Cont tubefeeds SLp when more awake Plan T fto goal Not awake enough to eat  Acute metabolic encephalopathy. -likely multifactorial -->maybe a little better Hepatic for sure contributing  Plan No sedating meds Supportive care Lactulose add bid 30 , follow clinical status and output ( risk of worsening encpeph out weights  Stool increase issues)  Hyperglycemia Plan SSI coverage Add lantus 5  Lt intertrochantric intramedullary nailing 12/08/16. Plan Per ortho  Get duplex upper rt - ark swelling - pending  I updated family in room  Ccm time 30 min  I updated family in full  Mcarthur Rossetti. Tyson Alias, MD, FACP Pgr: [phone] [facility]

## 2016-12-23 ENCOUNTER — Telehealth: Payer: Self-pay | Admitting: Internal Medicine

## 2016-12-23 LAB — COMPREHENSIVE METABOLIC PANEL
ALBUMIN: 1.4 g/dL — AB (ref 3.5–5.0)
ALT: 111 U/L — AB (ref 17–63)
AST: 123 U/L — AB (ref 15–41)
Alkaline Phosphatase: 102 U/L (ref 38–126)
Anion gap: 9 (ref 5–15)
BUN: 73 mg/dL — ABNORMAL HIGH (ref 6–20)
CHLORIDE: 110 mmol/L (ref 101–111)
CO2: 24 mmol/L (ref 22–32)
CREATININE: 1.18 mg/dL (ref 0.61–1.24)
Calcium: 7.7 mg/dL — ABNORMAL LOW (ref 8.9–10.3)
GFR calc non Af Amer: 55 mL/min — ABNORMAL LOW (ref >60)
Glucose, Bld: 218 mg/dL — ABNORMAL HIGH (ref 65–99)
Potassium: 3.6 mmol/L (ref 3.5–5.1)
SODIUM: 143 mmol/L (ref 135–145)
Total Bilirubin: 10.4 mg/dL — ABNORMAL HIGH (ref 0.3–1.2)
Total Protein: 5.1 g/dL — ABNORMAL LOW (ref 6.5–8.1)

## 2016-12-23 LAB — CBC
HCT: 27.4 % — ABNORMAL LOW (ref 39.0–52.0)
Hemoglobin: 8.7 g/dL — ABNORMAL LOW (ref 13.0–17.0)
MCH: 30.6 pg (ref 26.0–34.0)
MCHC: 31.8 g/dL (ref 30.0–36.0)
MCV: 96.5 fL (ref 78.0–100.0)
PLATELETS: 354 10*3/uL (ref 150–400)
RBC: 2.84 MIL/uL — AB (ref 4.22–5.81)
RDW: 26.1 % — ABNORMAL HIGH (ref 11.5–15.5)
WBC: 10.2 10*3/uL (ref 4.0–10.5)

## 2016-12-23 LAB — GLUCOSE, CAPILLARY
GLUCOSE-CAPILLARY: 208 mg/dL — AB (ref 65–99)
GLUCOSE-CAPILLARY: 182 mg/dL — AB (ref 65–99)
Glucose-Capillary: 212 mg/dL — ABNORMAL HIGH (ref 65–99)
Glucose-Capillary: 222 mg/dL — ABNORMAL HIGH (ref 65–99)
Glucose-Capillary: 182 mg/dL — ABNORMAL HIGH (ref 65–99)

## 2016-12-23 MED ORDER — INSULIN ASPART 100 UNIT/ML ~~LOC~~ SOLN
0.0000 [IU] | SUBCUTANEOUS | Status: DC
  Administered 2016-12-23: 5 [IU] via SUBCUTANEOUS
  Administered 2016-12-23 (×2): 3 [IU] via SUBCUTANEOUS
  Administered 2016-12-23: 5 [IU] via SUBCUTANEOUS
  Administered 2016-12-24: 8 [IU] via SUBCUTANEOUS

## 2016-12-23 MED ORDER — INSULIN GLARGINE 100 UNIT/ML ~~LOC~~ SOLN
10.0000 [IU] | Freq: Every day | SUBCUTANEOUS | Status: DC
  Administered 2016-12-23: 10 [IU] via SUBCUTANEOUS
  Filled 2016-12-23 (×2): qty 0.1

## 2016-12-23 MED ORDER — MIDODRINE HCL 5 MG PO TABS
10.0000 mg | ORAL_TABLET | Freq: Three times a day (TID) | ORAL | Status: DC
  Administered 2016-12-23 – 2016-12-28 (×14): 10 mg via ORAL
  Filled 2016-12-23 (×15): qty 2

## 2016-12-23 MED ORDER — LACTULOSE 10 GM/15ML PO SOLN
30.0000 g | Freq: Every day | ORAL | Status: DC
  Administered 2016-12-23 – 2016-12-28 (×6): 30 g via ORAL
  Filled 2016-12-23 (×6): qty 45

## 2016-12-23 NOTE — Progress Notes (Signed)
Pt refused to wear Bipap. RT will continue to monitor. 

## 2016-12-23 NOTE — Progress Notes (Signed)
Pt admitted to 2c02 from 53m.  Safety discussed and call bell with in reach. VSS.   Elijah Birk, RN

## 2016-12-23 NOTE — Progress Notes (Signed)
PULMONARY / CRITICAL CARE MEDICINE   Name: Danny Oneill MRN: [id number] DOB: Jul 30, 1931    ADMISSION DATE:  12/03/2016 CONSULTATION DATE:  12/08/2016  REFERRING MD:  OR   CHIEF COMPLAINT:  Hypotension  HISTORY OF PRESENT ILLNESS:   83 yo male admitted with Lt hip comminuted fracture after fall at home.  Developed agitated delirium while in hospital.  Had Lt intertrochanteric nailing on 9/04 with EBL 200 ml.  Noted to have urine retention.  Post op had hypotension, progressive anemia, and required reintubation.  Found to have E coli UTI.   PMHx of CAD, systolic CHF with EF 45 to 50%, HLD, PAF, CKD, Asbestosis exposure, BPH.  SUBJECTIVE:  Off pressors since last night Bili down   VITAL SIGNS: BP (!) 89/57   Pulse 64   Temp 98.3 F (36.8 C) (Oral)   Resp 20   Ht  (1.854 m)   Wt 107.7 kg (237 lb 7 oz)   SpO2 95%   BMI 31.33 kg/m   INTAKE / OUTPUT:  Intake/Output Summary (Last 24 hours) at 12/23/16 0818 Last data filed at 12/23/16 0600  Gross per 24 hour  Intake          1496.67 ml  Output             3420 ml  Net         -1923.33 ml   General:  Resting comfortably in bed HENT: NCAT NG in place PULM: CTA B, normal effort CV: RRR, no mgr GI: BS+, soft, nontender, biliary drain in place MSK: normal bulk and tone Neuro: awake, pulling at mitts    LABS:  BMET  Recent Labs Lab 12/21/16 0400 12/22/16 0357 12/23/16 0338  NA 141 139 143  K 4.0 4.0 3.6  CL 111 111 110  CO2 BUN 41* 52* 73*  CREATININE 0.73 0.85 1.18  GLUCOSE 173* 229* 218*    Electrolytes  Recent Labs Lab 12/20/16 0434 12/21/16 0400 12/22/16 0357 12/23/16 0338  CALCIUM 7.1* 7.0* 7.3* 7.7*  MG 1.9 1.8  --   --   PHOS 3.0 2.9  --   --     CBC  Recent Labs Lab 12/20/16 0434 12/21/16 0400 12/23/16 0338  WBC 14.8* 12.1* 10.2  HGB 9.7* 9.3* 8.7*  HCT 30.6* 29.1* 27.4*  PLT 279 309 354    Coag's  Recent Labs Lab 12/17/16 0420 12/22/16 0357  APTT  --  37*   INR 1.75 1.50    Sepsis Markers No results for input(s): LATICACIDVEN, PROCALCITON, O2SATVEN in the last 168 hours.  ABG No results for input(s): PHART, PCO2ART, PO2ART in the last 168 hours.  Liver Enzymes  Recent Labs Lab 12/20/16 0434 12/21/16 0400 12/23/16 0338  AST 131* 134* 123*  ALT 104* 108* 111*  ALKPHOS 125 139* 102  BILITOT 17.2* 14.0* 10.4*  ALBUMIN 1.4* 1.3* 1.4*    Cardiac Enzymes No results for input(s): TROPONINI, PROBNP in the last 168 hours.  Glucose  Recent Labs Lab 12/22/16 1206 12/22/16 1537 12/22/16 2028 12/22/16 2341 12/23/16 0425 12/23/16 0739  GLUCAP 152* 156* 190* 205* 212* 222*    Imaging US Abdomen Limited Ruq  Result Date: 12/22/2016 CLINICAL DATA:  83 year old male with elevated transaminases. Percutaneous cholecystotomy. Subsequent encounter. EXAM: ULTRASOUND ABDOMEN LIMITED RIGHT UPPER QUADRANT COMPARISON:  12/17/2016 plain film examination. 12/15/2016 cholecystogram. 12/12/2016 ultrasound. FINDINGS: Gallbladder: Cholecystostomy tube in place. Gallbladder sludge. Gallbladder wall thickening measuring up to 5.1 mm. Ultrasonographer was not  able to determine if the patient was tender over this region secondary to bandage. Common bile duct: Diameter: 5.4 mm. Liver: No hepatic lesion or intrahepatic biliary duct dilation. Portal vein is patent on color Doppler imaging with normal direction of blood flow towards the liver. Ascites.  Right-sided pleural effusion. IMPRESSION: Cholecystostomy tube in place. Gallbladder sludge. Gallbladder wall thickening measuring up to 5.1 mm. Ultrasonographer was not able to determine if the patient was tender over this region secondary to bandage. No hepatic lesion or intrahepatic biliary duct dilation. Ascites. Right-sided pleural effusion. Electronically Signed   By: Lacy Duverney M.D.   On: 12/22/2016 12:34    STUDIES:  Abd u/s 9/08 >>No gallstones. There is slight sludge in the gallbladder and there is  slight diffuse thickening of the gallbladder wall. Negative sonographic Murphy's sign.  CULTURES: 9/4 BC >> negative 9/4 UC >> E coli 9/11 blood culture>>>neg 9/12 perc drain >>> 9/12 c diff antigen positive, toxigenic pcr positive  ANTIBIOTICS: 9/4 ceftriaxone >> 9/8 9/8 ampicillin >> 9/10 Zosyn 9/10>>>9/12 ceftaz 9/12>>>9/17 PO vanco 9/12> stop date is 10 days pat 9/17  SIGNIFICANT EVENTS: 8/30 Admit 9/04  Surgery/ tx to ICU for hypotension 9/10: GI, surgical service and IR consulted   LINES/TUBES: 9/4 Left Radial Aline >>9/12 9/4 R TL IJ CVC >>  9/4 ETT >> 9/6 Perc GB drain 9/12>>  DISCUSSION: 83 yo with fall and hip fx.  Developed shock and VDRF after surgery from E coli UTI with sepsis and blood loss after surgery. -clinically continued to decline: WBC increasing, fever spike, tBilli still climbing, less responsive, still on pressors. Has + murphy's sign s/p Perc drain on 9/11. Started oral vanc for C diff 9/12 & changed his abx to avoid hepatic toxicity.   ASSESSMENT / PLAN: Septic shock initially from E coli UTI; now with C diff Cultures pending from perc drain Plan Continue oral vancomycin 2 weeks  SHOCK :Relative AI  Continue stress dose steroids Goal MAP > 55  Elevated LFTs.> improving  Elevated ammonia Probable acute acalculous colecystitis  -abd [place] w/ GB sludge and diffuse thickening;  + murphy's sign  S/p perc drain 9/11; Jaundice -->bili finally dropping  Plan Repeat LFT in am Monitor biliary drain output  Acute hypoxic respiratory failure > resolved Hx of asbestos related pleural plaques. -extubated 8/6 Plan Aspiration precautions  Hx of CAD s/p DES 02/2016, chronic combined CHF, HLD, PAF. Plan Consider restart Brilinta this week if LFT's improved Continue ASA Tele  Acute renal failure >> improved/resolved Hx of BPH with urine retention.  Plan Hold lasix Monitor BMET and UOP Replace electrolytes as needed   Dysphagia Moderate  protein calorie malnutrition Plan Cont tubefeeds SLp when more awake Plan Continue tube feeding until mental status improves  Acute metabolic encephalopathy. -likely multifactorial -->maybe a little better Elevated venous ammonia? Undetermined significance Plan Drop lactulose to daily dosing Lights on during daytime Hold any sedating meds   Hyperglycemia Plan Increase SSI coverage Increase lantus to 10  Lt intertrochantric intramedullary nailing 12/08/16. Plan Per ortho   Family updated by me  Move to SDU status today  Heber [place], MD [place] [facility] Pager: [phone] Cell: [phone] After 3pm or if no response, call [phone]

## 2016-12-23 NOTE — Progress Notes (Signed)
Gave report to [facility] and transferred pt to [facility]. Informed family. No changes in pt condition or vitals. No concerns.  Danne [place]

## 2016-12-24 DIAGNOSIS — N390 Urinary tract infection, site not specified: Secondary | ICD-10-CM

## 2016-12-24 DIAGNOSIS — A0472 Enterocolitis due to Clostridium difficile, not specified as recurrent: Secondary | ICD-10-CM

## 2016-12-24 DIAGNOSIS — I480 Paroxysmal atrial fibrillation: Secondary | ICD-10-CM

## 2016-12-24 DIAGNOSIS — E440 Moderate protein-calorie malnutrition: Secondary | ICD-10-CM

## 2016-12-24 DIAGNOSIS — N179 Acute kidney failure, unspecified: Secondary | ICD-10-CM

## 2016-12-24 DIAGNOSIS — K819 Cholecystitis, unspecified: Secondary | ICD-10-CM

## 2016-12-24 DIAGNOSIS — J61 Pneumoconiosis due to asbestos and other mineral fibers: Secondary | ICD-10-CM

## 2016-12-24 DIAGNOSIS — I255 Ischemic cardiomyopathy: Secondary | ICD-10-CM

## 2016-12-24 DIAGNOSIS — J9601 Acute respiratory failure with hypoxia: Secondary | ICD-10-CM

## 2016-12-24 DIAGNOSIS — B9629 Other Escherichia coli [E. coli] as the cause of diseases classified elsewhere: Secondary | ICD-10-CM

## 2016-12-24 DIAGNOSIS — Z1612 Extended spectrum beta lactamase (ESBL) resistance: Secondary | ICD-10-CM

## 2016-12-24 DIAGNOSIS — I272 Pulmonary hypertension, unspecified: Secondary | ICD-10-CM

## 2016-12-24 DIAGNOSIS — E2740 Unspecified adrenocortical insufficiency: Secondary | ICD-10-CM

## 2016-12-24 LAB — GLUCOSE, CAPILLARY
GLUCOSE-CAPILLARY: 94 mg/dL (ref 65–99)
GLUCOSE-CAPILLARY: 231 mg/dL — AB (ref 65–99)
GLUCOSE-CAPILLARY: 286 mg/dL — AB (ref 65–99)
Glucose-Capillary: 260 mg/dL — ABNORMAL HIGH (ref 65–99)
Glucose-Capillary: 235 mg/dL — ABNORMAL HIGH (ref 65–99)
Glucose-Capillary: 184 mg/dL — ABNORMAL HIGH (ref 65–99)

## 2016-12-24 LAB — CBC WITH DIFFERENTIAL/PLATELET
BASOS ABS: 0 10*3/uL (ref 0.0–0.1)
Basophils Relative: 0 %
Eosinophils Absolute: 0 10*3/uL (ref 0.0–0.7)
Eosinophils Relative: 0 %
HCT: 31 % — ABNORMAL LOW (ref 39.0–52.0)
HEMOGLOBIN: 9.9 g/dL — AB (ref 13.0–17.0)
LYMPHS ABS: 0.8 10*3/uL (ref 0.7–4.0)
Lymphocytes Relative: 8 %
MCH: 31.6 pg (ref 26.0–34.0)
MCHC: 31.9 g/dL (ref 30.0–36.0)
MCV: 99 fL (ref 78.0–100.0)
MONO ABS: 0.7 10*3/uL (ref 0.1–1.0)
Monocytes Relative: 7 %
NEUTROS PCT: 85 %
Neutro Abs: 8.9 10*3/uL — ABNORMAL HIGH (ref 1.7–7.7)
Platelets: 343 10*3/uL (ref 150–400)
RBC: 3.13 MIL/uL — AB (ref 4.22–5.81)
RDW: 27.5 % — AB (ref 11.5–15.5)
Smear Review: ADEQUATE
WBC: 10.4 10*3/uL (ref 4.0–10.5)

## 2016-12-24 LAB — MAGNESIUM: Magnesium: 2.2 mg/dL (ref 1.7–2.4)

## 2016-12-24 LAB — BASIC METABOLIC PANEL
Anion gap: 9 (ref 5–15)
BUN: 85 mg/dL — AB (ref 6–20)
CO2: 22 mmol/L (ref 22–32)
CREATININE: 1.15 mg/dL (ref 0.61–1.24)
Calcium: 7.6 mg/dL — ABNORMAL LOW (ref 8.9–10.3)
Chloride: 112 mmol/L — ABNORMAL HIGH (ref 101–111)
GFR calc Af Amer: >60 mL/min (ref >60)
GFR, EST NON AFRICAN AMERICAN: 57 mL/min — AB (ref >60)
Glucose, Bld: 258 mg/dL — ABNORMAL HIGH (ref 65–99)
Potassium: 3.6 mmol/L (ref 3.5–5.1)
SODIUM: 143 mmol/L (ref 135–145)

## 2016-12-24 LAB — POTASSIUM: Potassium: 3.5 mmol/L (ref 3.5–5.1)

## 2016-12-24 MED ORDER — POTASSIUM CHLORIDE 10 MEQ/100ML IV SOLN
INTRAVENOUS | Status: AC
  Filled 2016-12-24: qty 100

## 2016-12-24 MED ORDER — POTASSIUM CHLORIDE 10 MEQ/100ML IV SOLN
10.0000 meq | INTRAVENOUS | Status: AC
  Administered 2016-12-24 (×6): 10 meq via INTRAVENOUS
  Filled 2016-12-24 (×4): qty 100

## 2016-12-24 MED ORDER — INSULIN ASPART 100 UNIT/ML ~~LOC~~ SOLN
0.0000 [IU] | SUBCUTANEOUS | Status: DC
  Administered 2016-12-24: 11 [IU] via SUBCUTANEOUS
  Administered 2016-12-24: 7 [IU] via SUBCUTANEOUS
  Administered 2016-12-25 (×5): 4 [IU] via SUBCUTANEOUS
  Administered 2016-12-25: 7 [IU] via SUBCUTANEOUS
  Administered 2016-12-26 (×4): 4 [IU] via SUBCUTANEOUS
  Administered 2016-12-26 (×2): 7 [IU] via SUBCUTANEOUS
  Administered 2016-12-27 (×3): 4 [IU] via SUBCUTANEOUS
  Administered 2016-12-27: 3 [IU] via SUBCUTANEOUS
  Administered 2016-12-27 – 2016-12-29 (×9): 4 [IU] via SUBCUTANEOUS
  Administered 2016-12-29 – 2016-12-30 (×7): 3 [IU] via SUBCUTANEOUS
  Administered 2016-12-30 (×2): 4 [IU] via SUBCUTANEOUS
  Administered 2016-12-31 – 2017-01-01 (×6): 3 [IU] via SUBCUTANEOUS

## 2016-12-24 MED ORDER — INSULIN GLARGINE 100 UNIT/ML ~~LOC~~ SOLN
15.0000 [IU] | Freq: Every day | SUBCUTANEOUS | Status: DC
  Administered 2016-12-24: 15 [IU] via SUBCUTANEOUS
  Filled 2016-12-24: qty 0.15

## 2016-12-24 NOTE — Evaluation (Signed)
Clinical/Bedside Swallow Evaluation Patient Details  Name: Danny Oneill MRN: [id number] Date of Birth: 14-Jan-1932  Today's Date: 12/24/2016 Time: SLP Start Time (ACUTE ONLY): 1537 SLP Stop Time (ACUTE ONLY): 1558 SLP Time Calculation (min) (ACUTE ONLY): 21 min  Past Medical History:  Past Medical History:  Diagnosis Date  . Anemia   . Aortic insufficiency 03/09/2016   a. mild-mod AI by echo 03/04/16.  . Arthritis   . Asbestosis (HCC)    "no breathing problems" pt states worked in [employer] for many yrs and was exposed to asbestosis  . Bladder outlet obstruction 03/09/2016  . CAD (coronary artery disease)    a. 02/2016: inf lat STEMI with occluded OM2 which was treated with DES; otherwise he had moderate 50% oRCA, 40% oLM, 70% D1, 10% mLAD, 80% lateral 2nd marg which was treated medically.  . Cardiomyopathy, ischemic   . Chronic combined systolic and diastolic CHF (congestive heart failure) (HCC)    a. EF 30-35% at time of STEMI 02/2016, improved to 40-45% by echo 11/20 (but with pericardial effusion). b. Further improved EF to 55-60% 03/04/16.  Marland Kitchen CKD (chronic kidney disease), stage III   . Coronary artery disease   . Dyspnea   . First degree AV block   . Hip fracture (HCC) 11/2016  . Hyperlipidemia   . Hyponatremia   . Lumbar spondylosis   . Myocardial infarction (HCC)   . Orthostatic hypotension   . PAF (paroxysmal atrial fibrillation) (HCC)    a. dx 02/2016: not anticoagulated due to pericardial effusion; amiodarone stopped due to suspected allergic drug rash.  . Pericardial effusion    a. 02/2016 - followed clinically (mod-large at first then small-mod in f/u echo).   Past Surgical History:  Past Surgical History:  Procedure Laterality Date  . CARDIAC CATHETERIZATION N/A 02/02/2016   Procedure: Left Heart Cath and Coronary Angiography;  Surgeon: Corky Crafts, MD;  Location: [facility] INVASIVE CV LAB;  Service: Cardiovascular;  Laterality: N/A;  . CARDIAC CATHETERIZATION  N/A 02/02/2016   Procedure: Coronary Stent Intervention;  Surgeon: Corky Crafts, MD;  Location[facility] INVASIVE CV LAB;  Service: Cardiovascular;  Laterality: N/A;  . CARDIAC CATHETERIZATION N/A 02/02/2016   Procedure: IABP Insertion;  Surgeon: Corky Crafts, MD;  Location: [facility] INVASIVE CV LAB;  Service: Cardiovascular;  Laterality: N/A;  . CATARACTS REMOVED     BIL  . CORONARY STENT PLACEMENT  01/2016  . INTRAMEDULLARY (IM) NAIL INTERTROCHANTERIC Left 12/08/2016   Procedure: INTRAMEDULLARY (IM) NAIL INTERTROCHANTRIC;  Surgeon: Yolonda Kida, MD;  Location: [facility] OR;  Service: Orthopedics;  Laterality: Left;  . IR PERC CHOLECYSTOSTOMY  12/15/2016  . ROTATOR CUFF REPAIR  2011   L SHOULDER  . TOTAL KNEE ARTHROPLASTY Right 10/03/2013   Procedure: RIGHT TOTAL KNEE ARTHROPLASTY;  Surgeon: [name], MD;  Location: [facility] ORS;  Service: Orthopedics;  Laterality: Right;   HPI:  83 yo male with PMH of HLD, lumbar spondylosis, MI, CAD, PAF, cardiomyopathy, CKD, CHF who was admitted on 8/30 with L hip fracture. He developed hypoxia and hypotension after surgery (L IM nail) on 9/4 and required re-intubation 9/4-9/6. Pt with septic shock from UTI, then found to have c diff, also with acute acaclulous colecystitis s/p perc drain 9/11. SLP ordered for swallow evaluation as pt's alertness began to improve.   Assessment / Plan / Recommendation Clinical Impression  Pt has an open-mouth posture even when trying to swallow, which likely also contributes to his dry oral cavity. SLP attempted to  clear dried secretions from his tongue and roof of mouth, although they were very dried and adhered to his oral mucosa. A few ice chips were provided to try to moisten his mouth, but he makes only minimal attempts at lingual manipulation. Max cues were provided for posterior transit and swallow initiation. When he does attempt to swallow, he has multiple, audible subswallows per ice chip as well as intermittent  coughing and wet vocal quality. He appears to be at a high risk for aspiration given clinical observations combined with altered mentation. Recommend a strong emphasis on oral care and additional SLP f/u with potential for instrumental testing as mentation improves and swallow is initiated more consistently. SLP Visit Diagnosis: Dysphagia, oropharyngeal phase (R13.12)    Aspiration Risk  Severe aspiration risk    Diet Recommendation NPO;Alternative means - temporary   Medication Administration: Via alternative means    Other  Recommendations Oral Care Recommendations: Oral care QID Other Recommendations: Have oral suction available   Follow up Recommendations  (tba)      Frequency and Duration min 3x week  2 weeks       Prognosis Prognosis for Safe Diet Advancement: Good Barriers to Reach Goals: Cognitive deficits      Swallow Study   General HPI: 83 yo male with PMH of HLD, lumbar spondylosis, MI, CAD, PAF, cardiomyopathy, CKD, CHF who was admitted on 8/30 with L hip fracture. He developed hypoxia and hypotension after surgery (L IM nail) on 9/4 and required re-intubation 9/4-9/6. Pt with septic shock from UTI, then found to have c diff, also with acute acaclulous colecystitis s/p perc drain 9/11. SLP ordered for swallow evaluation as pt's alertness began to improve. Type of Study: Bedside Swallow Evaluation Previous Swallow Assessment: none in chart Diet Prior to this Study: NPO;NG Tube Temperature Spikes Noted: No Respiratory Status: Room air History of Recent Intubation: No (intubated 9/4-9/6) Behavior/Cognition: Alert;Confused;Requires cueing Oral Cavity Assessment: Dry;Dried secretions;Other (comment) (dark dried secretions) Oral Care Completed by SLP: Yes Oral Cavity - Dentition: Edentulous Self-Feeding Abilities: Total assist Patient Positioning: Upright in bed Baseline Vocal Quality: Breathy;Low vocal intensity Volitional Cough: Weak Volitional Swallow: Unable to  elicit    Oral/Motor/Sensory Function     Ice Chips Ice chips: Impaired Presentation: Spoon Oral Phase Impairments: Reduced labial seal (mouth stays open) Oral Phase Functional Implications: Oral holding;Other (comment) (limited manipulation) Pharyngeal Phase Impairments: Suspected delayed Swallow;Decreased hyoid-laryngeal movement;Multiple swallows;Wet Vocal Quality;Cough - Immediate   Thin Liquid Thin Liquid: Not tested    Nectar Thick Nectar Thick Liquid: Not tested   Honey Thick Honey Thick Liquid: Not tested   Puree Puree: Not tested   Solid   GO   Solid: Not tested        Maxcine Ham 12/24/2016,4:22 PM  Maxcine Ham, M.A. CCC-SLP [phone]

## 2016-12-24 NOTE — Progress Notes (Signed)
Referring Physician(s): Praveen Mannam   Supervising Physician: Gilmer Mor  Patient Status:  [facility] - In-pt  Chief Complaint:  Hyperbilirubinemia  Subjective:  Danny Oneill is resting comfortably. Opens eyes to voice. Wife and son at bedside. She is asking me if he can have water. I explained he needs to pass a swallow evaluation first.   Allergies: Amiodarone and Morphine and related  Medications: Prior to Admission medications   Medication Sig Start Date End Date Taking? Authorizing Provider  aspirin EC 81 MG tablet Take 81 mg by mouth daily with breakfast.    Yes [provider]  atorvastatin (LIPITOR) 20 MG tablet Take 1 tablet (20 mg total) by mouth daily. 04/07/16  Yes Lennette Bihari, MD  carvedilol (COREG) 3.125 MG tablet Take 3.125 mg by mouth 2 (two) times daily. 11/18/16  Yes [provider]  furosemide (LASIX) 20 MG tablet Take 1 tablet by mouth daily and ok to use extra tablet as needed for swelling. 10/14/16  Yes Cardama, Amadeo Garnet, MD  lisinopril (PRINIVIL,ZESTRIL) 2.5 MG tablet Take 1 tablet (2.5 mg total) by mouth daily. 09/21/16 12/20/16 Yes Lennette Bihari, MD  tamsulosin (FLOMAX) 0.4 MG CAPS capsule TAKE 1 CAPSULE(0.4 MG) BY MOUTH DAILY AFTER SUPPER 04/01/16  Yes Dunn, Tacey Ruiz, PA-C  ticagrelor (BRILINTA) 90 MG TABS tablet Take 1 tablet (90 mg total) by mouth 2 (two) times daily. 04/07/16  Yes Lennette Bihari, MD  carvedilol (COREG) 6.25 MG tablet Take 1 tablet (6.25 mg total) by mouth 2 (two) times daily. Patient not taking: Reported on 12/03/2016 04/28/16   Lennette Bihari, MD     Vital Signs: BP 94/62 (BP Location: Left Arm)   Pulse 62   Temp (!) 97.1 F (36.2 C)   Resp (!) 21   Ht 6\' 1"  (1.854 m)   Wt 230 lb 9.6 oz (104.6 kg)   SpO2 96%   BMI 30.42 kg/m   Physical Exam Opens eyes to voice Abdomen soft, NT Chole tube in place Bile in drainage bag, no purulence.  Imaging: Dg Chest Port 1 View  Result Date:  12/21/2016 CLINICAL DATA:  Acute respiratory failure EXAM: PORTABLE CHEST 1 VIEW COMPARISON:  12/15/2016 FINDINGS: Calcified pleural plaques bilaterally. Right central line and feeding tube are unchanged. Bilateral parenchymal scarring changes are stable. Heart is upper limits normal in size. No definite visible significant effusions. IMPRESSION: Stable pleuroparenchymal changes most compatible with asbestosis. No change. Electronically Signed   By: Charlett Nose M.D.   On: 12/21/2016 07:17   US Abdomen Limited Ruq  Result Date: 12/22/2016 CLINICAL DATA:  83 year old male with elevated transaminases. Percutaneous cholecystotomy. Subsequent encounter. EXAM: ULTRASOUND ABDOMEN LIMITED RIGHT UPPER QUADRANT COMPARISON:  12/17/2016 plain film examination. 12/15/2016 cholecystogram. 12/12/2016 ultrasound. FINDINGS: Gallbladder: Cholecystostomy tube in place. Gallbladder sludge. Gallbladder wall thickening measuring up to 5.1 mm. Ultrasonographer was not able to determine if the patient was tender over this region secondary to bandage. Common bile duct: Diameter: 5.4 mm. Liver: No hepatic lesion or intrahepatic biliary duct dilation. Portal vein is patent on color Doppler imaging with normal direction of blood flow towards the liver. Ascites.  Right-sided pleural effusion. IMPRESSION: Cholecystostomy tube in place. Gallbladder sludge. Gallbladder wall thickening measuring up to 5.1 mm. Ultrasonographer was not able to determine if the patient was tender over this region secondary to bandage. No hepatic lesion or intrahepatic biliary duct dilation. Ascites. Right-sided pleural effusion. Electronically Signed   By: Ernie Hew.D.  On: 12/22/2016 12:34    Labs:  CBC:  Recent Labs  12/20/16 0434 12/21/16 0400 12/23/16 0338 12/24/16 0523  WBC 14.8* 12.1* 10.2 10.4  HGB 9.7* 9.3* 8.7* 9.9*  HCT 30.6* 29.1* 27.4* 31.0*  PLT 279 309 354 343    COAGS:  Recent Labs  02/02/16 2100 12/08/16 2301  12/15/16 0948 12/17/16 0420 12/22/16 0357  INR 1.29 2.34 1.68 1.75 1.50  APTT 81*  --  33  --  37*    BMP:  Recent Labs  12/21/16 0400 12/22/16 0357 12/23/16 0338 12/24/16 0523  NA 141 139 143 143  K 4.0 4.0 3.6 3.6  CL 111 111 110 112*  CO2 GLUCOSE 173* 229* 218* 258*  BUN 41* 52* 73* 85*  CALCIUM 7.0* 7.3* 7.7* 7.6*  CREATININE 0.73 0.85 1.18 1.15  GFRNONAA >60 >60 55* 57*  GFRAA >60 >60 >60 >60    LIVER FUNCTION TESTS:  Recent Labs  12/19/16 0518 12/20/16 0434 12/21/16 0400 12/23/16 0338  BILITOT 18.6* 17.2* 14.0* 10.4*  AST 137* 131* 134* 123*  ALT 95* 104* 108* 111*  ALKPHOS 101 125 139* 102  PROT 4.7* 4.9* 4.9* 5.1*  ALBUMIN 1.3* 1.4* 1.3* 1.4*    Assessment and Plan:  Hyperbilirubinemia s/p cholecystostomy placement 9/11 bu Dr. Loreta Ave  Final culture report = no growth  Abdominal pain attributed to C. Diff infection.   Tbili improving, now 10.4. WBC down to 10.  Tube to remain in place for minimum of 6 weeks.   Will plan injecting the chole tube in the next day or so and possibly cap if cystic duct patent.  Electronically Signed: Gwynneth Macleod, PA-C 12/24/2016, 11:00 AM   I spent a total of 15 Minutes at the the patient's bedside AND on the patient's hospital floor or unit, greater than 50% of which was counseling/coordinating care for perc chole.

## 2016-12-24 NOTE — Progress Notes (Signed)
PT refused to wear Bipap.  RT will continue to monitor. 

## 2016-12-24 NOTE — Progress Notes (Signed)
PROGRESS NOTE    Danny Oneill  WUJ:[id number] DOB: 1932-02-08 DOA: 12/03/2016 PCP: Geoffry Paradise, MD   Brief Narrative:  83 y.o. WM PMHx Asbestosis, MI, Ischemic Cardiomyopathy, Chronic Systolic and Diastolic CHF (EF 95-62% on 03/04/2016), First Degree AV block, Paroxysmal Atrial Fibrillation, Pericardial Effusion 02/2016 HLD, CAD, Bladder Outlet Obstruction, CKD stage III   Presents with hip pain. Patient has been well. Taking medications as prescribed. At lunch chair slid from underneath him and patient hit the floor. Instant pain. EMS activated. Patient with physical findings of hip fracture. Transported to the emergency room for further evaluation.     ED course: Patient brought to the emergency room. Pain control with fentanyl. X-ray confirmed hip fracture.    Subjective: 9/20  patient awake, alert, follows commands, nods yes and no to questions appropriately. Negative CP, negative SOB. Positive abdominal pain   Assessment & Plan:   Principal Problem:   Closed left hip fracture (HCC) Active Problems:   Hyperlipidemia   CAD (coronary artery disease)   CKD (chronic kidney disease), stage III   Hip fracture (HCC)   Compromised airway   Hypovolemic shock (HCC)   Elevated troponin I level   Chronic combined systolic and diastolic heart failure (HCC)   Acute respiratory failure (HCC)   Elevated LFTs   LEFT hip fracture -9/4/ S/P LEFT intertrochanteric intramedullary nailing. -Patient's left hip appropriately swollen negative sign of infection  Septic shock/positive E coli UTI; positive C diff -Multifactorial Escherichia coli UTI, C. difficile infection, acalculous cholecystitis -Shock physiology resolving. -Complete 2 week course PO Vancomycin -  Shock/relative Adrenal insufficiency/Hypotension -Continue stress dose steroids Solu-Medrol 50 mg TID -Fludrocortisone 0.1 mg daily -Middle green 10 mg TID -MAP goal> 65  Acute Acalculous Colecystitis  -abd [place] w/ GB  sludge and diffuse thickening;  + murphy's sign (continued mild tenderness to palpation) -9/11 S/P percutaneous drain: Draining serosanguineous/chocolate colored fluid.   Elevated LFTs -Improving continue to monitor    Elevated ammonia -Normalized -Lactulose 30 g daily  Acute respiratory failure with hypoxia/Asbestosis -Extubated 8/6 -Resolved patient on room air currently. -Titrate O2 to maintain SPO2> 93%     Ischemic Cardiomyopathy/Chronic Systolic and Diastolic CHF -Echocardiogram 2/8 EF 45-50%.-Grade 1 diastolic dysfunction.-PA peak pressure 51 mmHg -Strict in and out -Daily weight -Currently patient BP would not support traditional CHF medication. -When BP improves will consider restarting Brilinta: Will also need to ensure LFTs have improved -Continue ASA  Pulmonary HTN -see ischemic cardiomyopathy  CAD -S/P DES placed 02/2016 -See ischemic cardiomyopathy  Paroxysmal atrial fibrillation -Currently in NSR  Acute renal failure Recent Labs Lab 12/19/16 0518 12/20/16 0434 12/21/16 0400 12/22/16 0357 12/23/16 0338 12/24/16 0523  CREATININE 0.87 0.72 0.73 0.85 1.18 1.15  Resolved  Dysphagia -Patient much more awake today swallow study pending  -Counseled wife and son on why patient can have ice chips until we evaluate his swallow okay to moisten his lips with wet cloth.   Moderate protein calorie malnutrition -Continue tube feeds -Swallow study pending    Acute metabolic encephalopathy. -Multifactorial septic shock, elevated ammonia, metabolic encephalopathy -Hold sedating medication -Ammonia level normalized continue to monitor     Hyperglycemia -9/9 Hemoglobin A1c pending = 5.8, does not meet criteria for prediabetes/diabetes - 9/20 increase Lantus 15 units daily -9/20 increase resistant SSI   Hypokalemia -Potassium goal> 4 -Potassium 60 mEq -Repeat K/Mg        DVT prophylaxis: SCD Code Status: DNR Family Communication: Wife and son  present at bedside for discussion of  plan of care Disposition Plan: TBD   Consultants:  IR [facility] M Orthopedic surgery GI Dr. Willis Modena   Procedures/Significant Events:  8/30 Admit 9/4 S/P LEFT intertrochantric intramedullary nailing  9/04  Surgery/ tx to ICU for hypotension 9/8 abdominal ultrasound: Negative gallstones, slight sludge in the gallbladder and there is slight diffuse thickening of the gallbladder wall. Negative sonographic Murphy's sign. 9/10: GI, surgical service and IR consulted     I have personally reviewed and interpreted all radiology studies and my findings are as above.  VENTILATOR SETTINGS:    Cultures 9/4 BC >> negative 9/4 UC >> E coli 9/11 blood culture>>>neg 9/12 perc drain >>> 9/12 c diff antigen positive, toxigenic pcr positive   Antimicrobials: Anti-infectives    Start     Dose/Rate Stop   12/18/16 0000  vancomycin (VANCOCIN) 50 mg/mL oral solution 500 mg     500 mg 12/31/16 0904   12/17/16 1800  vancomycin (VANCOCIN) 50 mg/mL oral solution 500 mg  Status:  Discontinued     500 mg 12/17/16 2333   12/16/16 1800  vancomycin (VANCOCIN) 50 mg/mL oral solution 125 mg  Status:  Discontinued     125 mg 12/17/16 1509   12/16/16 1800  cefTAZidime (FORTAZ) 2 g in dextrose 5 % 50 mL IVPB  Status:  Discontinued     2 g 100 mL/hr over 30 Minutes 12/21/16 1323   12/14/16 1215  piperacillin-tazobactam (ZOSYN) IVPB 3.375 g  Status:  Discontinued     3.375 g 12.5 mL/hr over 240 Minutes 12/16/16 1740   12/12/16 1200  ampicillin (OMNIPEN) 1 g in sodium chloride 0.9 % 50 mL IVPB  Status:  Discontinued     1 g 150 mL/hr over 20 Minutes 12/14/16 1127   12/09/16 0100  ceFAZolin (ANCEF) IVPB 1 g/50 mL premix     1 g 100 mL/hr over 30 Minutes 12/09/16 1557   12/08/16 2300  cefTRIAXone (ROCEPHIN) 1 g in dextrose 5 % 50 mL IVPB  Status:  Discontinued     1 g 100 mL/hr over 30 Minutes 12/12/16 1002   12/04/16 0600  ceFAZolin (ANCEF) IVPB 2g/100 mL premix      2 g 200 mL/hr over 30 Minutes 12/05/16 0559       Devices    LINES / TUBES:  Left Radial Aline 9/4 >>9/12 R TL IJ CVC 9/4  >>  9/20 ETT   9/4 >> 9/6 Perc GB drain 9/12>>     Continuous Infusions: . sodium chloride 10 mL/hr at 12/22/16 1400  . sodium chloride    . feeding supplement (VITAL AF 1.2 CAL) 1,000 mL (12/24/16 0600)  . norepinephrine (LEVOPHED) Adult infusion Stopped (12/22/16 0630)     Objective: Vitals:   12/23/16 1730 12/23/16 1807 12/23/16 2002 12/24/16 0317  BP: (!) 88/57 114/63 94/62   Pulse: 65 63 62   Resp: 20 19 (!) 21   Temp:  (!) 97.3 F (36.3 C) (!) 97.5 F (36.4 C) 97.6 F (36.4 C)  TempSrc:  Axillary Axillary Axillary  SpO2: 96% 97% 96%   Weight:  230 lb 9.6 oz (104.6 kg)    Height:   (1.854 m)      Intake/Output Summary (Last 24 hours) at 12/24/16 0708 Last data filed at 12/24/16 0600  Gross per 24 hour  Intake              850 ml  Output  1955 ml  Net            -1105 ml   Filed Weights   12/22/16 0418 12/23/16 0344 12/23/16 1807  Weight: 237 lb 10.5 oz (107.8 kg) 237 lb 7 oz (107.7 kg) 230 lb 9.6 oz (104.6 kg)    Examination:  General: Alert, obeys commands, nods yes and no to questions,No acute respiratory distress Eyes: negative scleral hemorrhage, positive icterus ENT: Negative Runny nose,  Positive gingival blpositive healing tongue lesions (from intubation) Neck:  Negative scars, masses, torticollis, lymphadenopathy, JVD, positive right IJ CVL in place covered and clean negative sign of infection Lungs: Clear to auscultation bilaterally without wheezes or crackles Cardiovascular: Regular rate and rhythm without murmur gallop or rub normal S1 and S2 Abdomen:  Positive abdominal pain (RUQ) ,  positive distention,positive soft, bowel sounds, no rebound, no ascites, no appreciable mass, anasarca, right percutaneous cholecystostomy drain  Extremities: No significant cyanosis, clubbing,  Anasarca Skin:   jaundice Central nervous system:   Patient extremely weak cannot fully evaluate however moves all extremities to command,  .     Data Reviewed: Care during the described time interval was provided by me .  I have reviewed this patient's available data, including medical history, events of note, physical examination, and all test results as part of my evaluation.   CBC:  Recent Labs Lab 12/19/16 0518 12/20/16 0434 12/21/16 0400 12/23/16 0338 12/24/16 0523  WBC 18.3* 14.8* 12.1* 10.2 10.4  NEUTROABS  --   --   --   --  8.9*  HGB 9.5* 9.7* 9.3* 8.7* 9.9*  HCT 29.3* 30.6* 29.1* 27.4* 31.0*  MCV 94.5 94.7 96.0 96.5 99.0  PLT 256 279 309 354 343   Basic Metabolic Panel:  Recent Labs Lab 12/20/16 0434 12/21/16 0400 12/22/16 0357 12/23/16 0338 12/24/16 0523  NA 140 141 139 143 143  K 3.8 4.0 4.0 3.6 3.6  CL 110 111 111 110 112*  CO2 GLUCOSE 162* 173* 229* 218* 258*  BUN 46* 41* 52* 73* 85*  CREATININE 0.72 0.73 0.85 1.18 1.15  CALCIUM 7.1* 7.0* 7.3* 7.7* 7.6*  MG 1.9 1.8  --   --   --   PHOS 3.0 2.9  --   --   --    GFR: Estimated Creatinine Clearance: 61.8 mL/min (by C-G formula based on SCr of 1.15 mg/dL). Liver Function Tests:  Recent Labs Lab 12/18/16 0846 12/19/16 0518 12/20/16 0434 12/21/16 0400 12/23/16 0338  AST 144* 137* 131* 134* 123*  ALT 97* 95* 104* 108* 111*  ALKPHOS 96 101 125 139* 102  BILITOT 20.0* 18.6* 17.2* 14.0* 10.4*  PROT 4.5* 4.7* 4.9* 4.9* 5.1*  ALBUMIN 1.4* 1.3* 1.4* 1.3* 1.4*   No results for input(s): LIPASE, AMYLASE in the last 168 hours. No results for input(s): AMMONIA in the last 168 hours. Coagulation Profile:  Recent Labs Lab 12/22/16 0357  INR 1.50   Cardiac Enzymes: No results for input(s): CKTOTAL, CKMB, CKMBINDEX, TROPONINI in the last 168 hours. BNP (last 3 results) No results for input(s): PROBNP in the last 8760 hours. HbA1C: No results for input(s): HGBA1C in the last 72  hours. CBG:  Recent Labs Lab 12/23/16 0739 12/23/16 1143 12/23/16 1719 12/23/16 2004 12/24/16 0456  GLUCAP 222* 208* 182* 182* 94   Lipid Profile: No results for input(s): CHOL, HDL, LDLCALC, TRIG, CHOLHDL, LDLDIRECT in the last 72 hours. Thyroid Function Tests: No results for input(s): TSH, T4TOTAL, FREET4,  T3FREE, THYROIDAB in the last 72 hours. Anemia Panel: No results for input(s): VITAMINB12, FOLATE, FERRITIN, TIBC, IRON, RETICCTPCT in the last 72 hours. Urine analysis:    Component Value Date/Time   COLORURINE YELLOW 12/08/2016 2015   APPEARANCEUR TURBID (A) 12/08/2016 2015   LABSPEC 1.012 12/08/2016 2015   PHURINE 5.0 12/08/2016 2015   GLUCOSEU NEGATIVE 12/08/2016 2015   HGBUR LARGE (A) 12/08/2016 2015   BILIRUBINUR NEGATIVE 12/08/2016 2015   KETONESUR NEGATIVE 12/08/2016 2015   PROTEINUR 100 (A) 12/08/2016 2015   UROBILINOGEN 1.0 09/20/2013 0851   NITRITE NEGATIVE 12/08/2016 2015   LEUKOCYTESUR MODERATE (A) 12/08/2016 2015   Sepsis Labs: @LABRCNTIP (procalcitonin:4,lacticidven:4)  ) Recent Results (from the past 240 hour(s))  Aerobic/Anaerobic Culture (surgical/deep wound)     Status: None   Collection Time: 12/15/16  4:49 PM  Result Value Ref Range Status   Specimen Description BILE  Final   Special Requests NONE  Final   Gram Stain NO WBC SEEN NO ORGANISMS SEEN   Final   Culture No growth aerobically or anaerobically.  Final   Report Status 12/20/2016 FINAL  Final  C difficile quick scan w PCR reflex     Status: Abnormal   Collection Time: 12/16/16  8:39 AM  Result Value Ref Range Status   C Diff antigen POSITIVE (A) NEGATIVE Final   C Diff toxin NEGATIVE NEGATIVE Final   C Diff interpretation Results are indeterminate. See PCR results.  Final  Clostridium Difficile by PCR     Status: Abnormal   Collection Time: 12/16/16  8:39 AM  Result Value Ref Range Status   Toxigenic C Difficile by pcr POSITIVE (A) NEGATIVE Final    Comment: Positive for  toxigenic C. difficile with little to no toxin production. Only treat if clinical presentation suggests symptomatic illness.         Radiology Studies: US Abdomen Limited Ruq  Result Date: 12/22/2016 CLINICAL DATA:  83 year old male with elevated transaminases. Percutaneous cholecystotomy. Subsequent encounter. EXAM: ULTRASOUND ABDOMEN LIMITED RIGHT UPPER QUADRANT COMPARISON:  12/17/2016 plain film examination. 12/15/2016 cholecystogram. 12/12/2016 ultrasound. FINDINGS: Gallbladder: Cholecystostomy tube in place. Gallbladder sludge. Gallbladder wall thickening measuring up to 5.1 mm. Ultrasonographer was not able to determine if the patient was tender over this region secondary to bandage. Common bile duct: Diameter: 5.4 mm. Liver: No hepatic lesion or intrahepatic biliary duct dilation. Portal vein is patent on color Doppler imaging with normal direction of blood flow towards the liver. Ascites.  Right-sided pleural effusion. IMPRESSION: Cholecystostomy tube in place. Gallbladder sludge. Gallbladder wall thickening measuring up to 5.1 mm. Ultrasonographer was not able to determine if the patient was tender over this region secondary to bandage. No hepatic lesion or intrahepatic biliary duct dilation. Ascites. Right-sided pleural effusion. Electronically Signed   By: Lacy Duverney M.D.   On: 12/22/2016 12:34        Scheduled Meds: . aspirin  81 mg Per Tube Daily  . bacitracin  1 application Topical BID  . chlorhexidine  15 mL Mouth Rinse BID  . Chlorhexidine Gluconate Cloth  6 each Topical Daily  . fludrocortisone  0.1 mg Per Tube Daily  . hydrocortisone sod succinate (SOLU-CORTEF) inj  50 mg Intravenous Q6H  . insulin aspart  0-15 Units Subcutaneous Q4H  . insulin glargine  10 Units Subcutaneous QHS  . lactulose  30 g Oral Daily  . mouth rinse  15 mL Mouth Rinse q12n4p  . midodrine  10 mg Oral TID WC  . sodium chloride flush  10-40 mL Intracatheter Q12H  . sodium chloride flush  5 mL  Intravenous Q8H  . vancomycin  500 mg Per Tube Q6H   Continuous Infusions: . sodium chloride 10 mL/hr at 12/22/16 1400  . sodium chloride    . feeding supplement (VITAL AF 1.2 CAL) 1,000 mL (12/24/16 0600)  . norepinephrine (LEVOPHED) Adult infusion Stopped (12/22/16 0630)     LOS: 21 days    Time spent: 40 minutes    Aleyza Salmi, Roselind Messier, MD [facility] Pager [phone]   If 7PM-7AM, please contact night-coverage [email or web address] Password [email or web address] 12/24/2016, 7:08 AM

## 2016-12-24 NOTE — Progress Notes (Signed)
Nutrition Follow-up  DOCUMENTATION CODES:   Non-severe (moderate) malnutrition in context of chronic illness  INTERVENTION:   -Continue Vital AF 1.2 @ 70 ml/hr via cortrak tube  Tube feeding regimen provides 2016 kcal (100% of needs), 126 grams of protein, and 1419 ml of H2O.   NUTRITION DIAGNOSIS:   Malnutrition (moderate) related to chronic illness (CAD, CKD) as evidenced by mild depletion of body fat, mild depletion of muscle mass, percent weight loss (9% weight loss in 3 months).  Ongoing  GOAL:   Patient will meet greater than or equal to 90% of their needs  Met with TF  MONITOR:   Vent status, Labs, TF tolerance, Skin, I & O's  REASON FOR ASSESSMENT:   Consult Enteral/tube feeding initiation and management  ASSESSMENT:   83 yo male with PMH of HLD, lumbar spondylosis, MI, CAD, PAF, cardiomyopathy, CKD, CHF who was admitted on 8/30 with L hip fracture. He developed hypoxia and hypotension after surgery (L IM nail) on 9/4 and required re-intubation and transfer to the ICU.  9/11- s/p perc cholecystostomy drain via IR 9/18- off pressors 9/19- transferred from ICU to SDU  Pt remains with rectal tube; noted 800 ml output within the past 24 hours per doc flowsheets.   Pt in with MD at time of visit. Spoke with RN, who reports pt remains stable. Pt is tolerating TF well per RN report. Vital AF 1.2 infusing via cortrak tube at goal rate of 70 ml/hr. Regimen providing 2016 kcals, 126 grams protein, and 1419 ml free water meeting 100% of estimated nutritional needs.   Plan for swallow study when pt is more alert.   Labs reviewed: CBGS: 94-260.   Diet Order:  Diet NPO time specified  Skin:   (closed lt thigh incision)  Last BM:  12/23/16 (800 ml via rectal tube)  Height:   Ht Readings from Last 1 Encounters:  12/23/16 '6\' 1"'  (1.854 m)    Weight:   Wt Readings from Last 1 Encounters:  12/23/16 230 lb 9.6 oz (104.6 kg)    Ideal Body Weight:  83.6 kg  BMI:   Body mass index is 30.42 kg/m.  Estimated Nutritional Needs:   Kcal:  1900-2250 kcals  Protein:  115-136 g   Fluid:  2 L  EDUCATION NEEDS:   No education needs identified at this time  Keiran Sias A. Jimmye Norman, RD, LDN, CDE Pager: [phone] After hours Pager: [phone]

## 2016-12-25 DIAGNOSIS — R739 Hyperglycemia, unspecified: Secondary | ICD-10-CM

## 2016-12-25 LAB — BASIC METABOLIC PANEL
ANION GAP: 8 (ref 5–15)
BUN: 77 mg/dL — AB (ref 6–20)
CALCIUM: 7.8 mg/dL — AB (ref 8.9–10.3)
CO2: 22 mmol/L (ref 22–32)
Chloride: 115 mmol/L — ABNORMAL HIGH (ref 101–111)
Creatinine, Ser: 1.02 mg/dL (ref 0.61–1.24)
GFR calc Af Amer: >60 mL/min (ref >60)
GFR calc non Af Amer: >60 mL/min (ref >60)
GLUCOSE: 215 mg/dL — AB (ref 65–99)
Potassium: 3.4 mmol/L — ABNORMAL LOW (ref 3.5–5.1)
Sodium: 145 mmol/L (ref 135–145)

## 2016-12-25 LAB — GLUCOSE, CAPILLARY
GLUCOSE-CAPILLARY: 171 mg/dL — AB (ref 65–99)
GLUCOSE-CAPILLARY: 190 mg/dL — AB (ref 65–99)
GLUCOSE-CAPILLARY: 198 mg/dL — AB (ref 65–99)
GLUCOSE-CAPILLARY: 220 mg/dL — AB (ref 65–99)
Glucose-Capillary: 188 mg/dL — ABNORMAL HIGH (ref 65–99)
Glucose-Capillary: 202 mg/dL — ABNORMAL HIGH (ref 65–99)

## 2016-12-25 LAB — CBC WITH DIFFERENTIAL/PLATELET
BASOS PCT: 0 %
Basophils Absolute: 0 10*3/uL (ref 0.0–0.1)
Eosinophils Absolute: 0 10*3/uL (ref 0.0–0.7)
Eosinophils Relative: 0 %
HEMATOCRIT: 31.3 % — AB (ref 39.0–52.0)
HEMOGLOBIN: 9.7 g/dL — AB (ref 13.0–17.0)
Lymphocytes Relative: 6 %
Lymphs Abs: 0.6 10*3/uL — ABNORMAL LOW (ref 0.7–4.0)
MCH: 30.9 pg (ref 26.0–34.0)
MCHC: 31 g/dL (ref 30.0–36.0)
MCV: 99.7 fL (ref 78.0–100.0)
MONO ABS: 0.5 10*3/uL (ref 0.1–1.0)
Monocytes Relative: 5 %
NEUTROS ABS: 9.1 10*3/uL — AB (ref 1.7–7.7)
NEUTROS PCT: 89 %
Platelets: 329 10*3/uL (ref 150–400)
RBC: 3.14 MIL/uL — ABNORMAL LOW (ref 4.22–5.81)
RDW: 27.5 % — ABNORMAL HIGH (ref 11.5–15.5)
WBC: 10.2 10*3/uL (ref 4.0–10.5)

## 2016-12-25 LAB — MAGNESIUM: Magnesium: 2.2 mg/dL (ref 1.7–2.4)

## 2016-12-25 MED ORDER — TAMSULOSIN HCL 0.4 MG PO CAPS
0.4000 mg | ORAL_CAPSULE | Freq: Every day | ORAL | Status: DC
  Administered 2016-12-25 – 2017-01-15 (×19): 0.4 mg via ORAL
  Filled 2016-12-25 (×21): qty 1

## 2016-12-25 MED ORDER — WHITE PETROLATUM GEL
Status: AC
  Administered 2016-12-25: 1
  Filled 2016-12-25: qty 1

## 2016-12-25 MED ORDER — POTASSIUM CHLORIDE 20 MEQ/15ML (10%) PO SOLN
40.0000 meq | Freq: Two times a day (BID) | ORAL | Status: AC
  Administered 2016-12-25 (×2): 40 meq
  Filled 2016-12-25 (×2): qty 30

## 2016-12-25 MED ORDER — INSULIN GLARGINE 100 UNIT/ML ~~LOC~~ SOLN
20.0000 [IU] | Freq: Every day | SUBCUTANEOUS | Status: DC
  Administered 2016-12-25: 20 [IU] via SUBCUTANEOUS
  Filled 2016-12-25: qty 0.2

## 2016-12-25 NOTE — Telephone Encounter (Signed)
Did he leave a number?  I am at [facility] this week. Hard to commit to seeing patients at [facility] when I am on this campus

## 2016-12-25 NOTE — Evaluation (Signed)
Physical Therapy Evaluation Patient Details Name: Danny Oneill MRN: [id number] DOB: September 24, 1931 Today's Date: 12/25/2016   History of Present Illness  83 yo male with PMH of HLD, lumbar spondylosis, MI, CAD, PAF, cardiomyopathy, CKD, CHF who was admitted on 8/30 with L hip fracture. He developed hypoxia and hypotension after surgery (L IM nail) on 9/4 and required re-intubation 9/4-9/6. Pt with septic shock from UTI, then found to have c diff, also with acute acaclulous colecystitis s/p perc drain 9/11.   PMH positive for MI, cardiomyopathy, CHF, first degree AV block, PAF, CKD III, bladder outlet obstruction, lumbar spondylosis, and TKA.  Clinical Impression  Patient presents with significant dependencies for mobility due to prolonged bedrest, pain and decreased mobility L LE, generalized weakness, limited weight bearing and decreased activity tolerance.  Currently total A for EOB activity.  Feel continued skilled PT in the acute setting indicated to address issues and progress to SNF level rehab prior to d/c home with assist.     Follow Up Recommendations SNF    Equipment Recommendations  Other (comment) (TBA at next venue)    Recommendations for Other Services       Precautions / Restrictions Precautions Precautions: Fall Precaution Comments: rectal tube, cortrack, c-diff Restrictions Weight Bearing Restrictions: Yes LLE Weight Bearing: Partial weight bearing LLE Partial Weight Bearing Percentage or Pounds: 50      Mobility  Bed Mobility Overal bed mobility: Needs Assistance Bed Mobility: Rolling;Sidelying to Sit;Sit to Supine Rolling: Total assist;+2 for physical assistance Sidelying to sit: Total assist;+2 for physical assistance   Sit to supine: Total assist;+2 for physical assistance   General bed mobility comments: rolled to R and assisted to place pt's hand on railing, side to sit with assist for legs and trunk, then to supine assist for legs and trunk and to scoot to  [facility]  Transfers                    Ambulation/Gait                Stairs            Wheelchair Mobility    Modified Rankin (Stroke Patients Only)       Balance Overall balance assessment: Needs assistance   Sitting balance-Leahy Scale: Zero Sitting balance - Comments: leaning back in sitting with support from behind, about 5 minutes sitting BP 100/71; pt pushing back at times with fatigue and did vocalize he wanted to return to supine                                     Pertinent Vitals/Pain Pain Assessment: Faces Faces Pain Scale: Hurts even more Pain Location: grimacing with movement of L LE Pain Descriptors / Indicators: Grimacing;Guarding Pain Intervention(s): Monitored during session;Repositioned;Limited activity within patient's tolerance    Home Living Family/patient expects to be discharged to:: Skilled nursing facility Living Arrangements: Spouse/significant other Available Help at Discharge: Family;Available 24 hours/day Type of Home: House Home Access: Stairs to enter Entrance Stairs-Rails: Right Entrance Stairs-Number of Steps: 2 Home Layout: One level Home Equipment: Walker - 2 wheels;Bedside commode;Shower seat Additional Comments: wife unable to assist pt, they have large family and children/grandchildren take turns    Prior Function Level of Independence: Independent with assistive device(s)         Comments: sister giving history and reports pt unsteady on his feet more so lately and  unsure about distance to chair and sat down too far from chair in restaurant and fell     Hand Dominance        Extremity/Trunk Assessment   Upper Extremity Assessment Upper Extremity Assessment: RUE deficits/detail;LUE deficits/detail RUE Deficits / Details: AAROM limited shoulder flexion and elbow extension and edema, weeping of skin, lifts antigravity, but not through ROM LUE Deficits / Details: AROM able to lift to  shoulder height, some edema, but not as much as R    Lower Extremity Assessment Lower Extremity Assessment: RLE deficits/detail;LLE deficits/detail RLE Deficits / Details: AAROM limited tight in hip and ankle and knee with scar from TKA, able to lift from bed only with A LLE Deficits / Details: AAROM through very limited ROM due to pain and unable to lift leg without lot of help    Cervical / Trunk Assessment Cervical / Trunk Assessment: Kyphotic  Communication   Communication: HOH  Cognition Arousal/Alertness: Lethargic Behavior During Therapy: Restless Overall Cognitive Status: Difficult to assess                                 General Comments: Patient mumbling mostly and sister reports very hard of hearing, able to vocalize clearly on couple of occasions       General Comments      Exercises General Exercises - Lower Extremity Ankle Circles/Pumps: PROM;5 reps;Both;Supine Heel Slides: PROM;AAROM;Both;5 reps;Supine   Assessment/Plan    PT Assessment Patient needs continued PT services  PT Problem List Decreased strength;Decreased range of motion;Decreased activity tolerance;Decreased balance;Decreased cognition;Decreased mobility;Cardiopulmonary status limiting activity;Pain;Decreased knowledge of use of DME       PT Treatment Interventions DME instruction;Therapeutic activities;Therapeutic exercise;Patient/family education;Balance training;Wheelchair mobility training;Functional mobility training;Cognitive remediation    PT Goals (Current goals can be found in the Care Plan section)  Acute Rehab PT Goals Patient Stated Goal: Per sister to return home with assistance PT Goal Formulation: With patient/family Time For Goal Achievement: 01/08/17 Potential to Achieve Goals: Fair    Frequency Min 2X/week   Barriers to discharge        Co-evaluation               AM-PAC PT "6 Clicks" Daily Activity  Outcome Measure Difficulty turning over in bed  (including adjusting bedclothes, sheets and blankets)?: Unable Difficulty moving from lying on back to sitting on the side of the bed? : Unable Difficulty sitting down on and standing up from a chair with arms (e.g., wheelchair, bedside commode, etc,.)?: Unable Help needed moving to and from a bed to chair (including a wheelchair)?: Total Help needed walking in hospital room?: Total Help needed climbing 3-5 steps with a railing? : Total 6 Click Score: 6    End of Session Equipment Utilized During Treatment: Gait belt Activity Tolerance: Patient limited by fatigue Patient left: in bed;with family/visitor present;with call bell/phone within reach;with bed alarm set   PT Visit Diagnosis: Muscle weakness (generalized) (M62.81);Difficulty in walking, not elsewhere classified (R26.2);Other symptoms and signs involving the nervous system (R29.898);Pain Pain - Right/Left: Left Pain - part of body: Hip    Time: 6834-1962 PT Time Calculation (min) (ACUTE ONLY): 26 min   Charges:   PT Evaluation $PT Eval High Complexity: 1 High PT Treatments $Therapeutic Activity: 8-22 mins   PT G CodesSheran Oneill, [place] [phone] 12/25/2016   Danny Oneill 12/25/2016, 11:27 AM

## 2016-12-25 NOTE — Progress Notes (Addendum)
PROGRESS NOTE    Danny Oneill  XBJ:[id number] DOB: Jun 12, 1931 DOA: 12/03/2016 PCP: Geoffry Paradise, MD   Brief Narrative:  83 y.o. WM PMHx Asbestosis, MI, Ischemic Cardiomyopathy, Chronic Systolic and Diastolic CHF (EF 30-86% on 03/04/2016), First Degree AV block, Paroxysmal Atrial Fibrillation, Pericardial Effusion 02/2016 HLD, CAD, Bladder Outlet Obstruction, CKD stage III   Presents with hip pain. Patient has been well. Taking medications as prescribed. At lunch chair slid from underneath him and patient hit the floor. Instant pain. EMS activated. Patient with physical findings of hip fracture. Transported to the emergency room for further evaluation.     ED course: Patient brought to the emergency room. Pain control with fentanyl. X-ray confirmed hip fracture.    Subjective: 9/21 awake, not following commands. Per wife was up on the side of bed working with physical therapy (most likely tired)      Assessment & Plan:   Principal Problem:   Closed left hip fracture (HCC) Active Problems:   Hyperlipidemia   CAD (coronary artery disease)   CKD (chronic kidney disease), stage III   Hip fracture (HCC)   Compromised airway   Hypovolemic shock (HCC)   Elevated troponin I level   Chronic combined systolic and diastolic heart failure (HCC)   Acute respiratory failure (HCC)   Elevated LFTs   LEFT hip fracture -9/4/ S/P LEFT intertrochanteric intramedullary nailing. -Left hip appropriately swollen, negative sign of infection.   Septic shock/positive E coli UTI; positive C diff colitis -Multifactorial Escherichia coli UTI, C. difficile infection, acalculous cholecystitis -Shock physiology resolved. -Complete 2 week course PO Vancomycin  -  Shock/relative Adrenal insufficiency/Hypotension -Continue stress dose steroids Solu-Medrol 50 mg TID -Fludrocortisone 0.1 mg daily -Midodrin 10 mg TID -MAP goal> 65  Acute Acalculous Colecystitis  -abd [place] w/ GB sludge and diffuse  thickening;  + murphy's sign (continued mild tenderness to palpation) -9/11 S/P percutaneous cholecystectomy drain: Draining serosanguineous fluid   Elevated LFTs -Improving continue to monitor    Elevated ammonia -Normalized -Lactulose 30 g daily  Acute respiratory failure with hypoxia/Asbestosis -Extubated 9/6 -Currently on room air  -Titrate O2 to maintain SPO2 > 93%     Ischemic Cardiomyopathy/Chronic Systolic and Diastolic CHF -Echocardiogram 2/8 EF 45-50%.-Grade 1 diastolic dysfunction.-PA peak pressure 51 mmHg -Strict in and out since admission +12 L -Daily weight Filed Weights   12/23/16 1807 12/25/16 0323 12/26/16 0429  Weight: 230 lb 9.6 oz (104.6 kg) 225 lb (102.1 kg) 220 lb 7.4 oz (100 kg)  -current BP would not support traditional CHF medication -If/When BP improves consider restarting Brilinta: Also need to ensure LFTs have improved -Continue ASA  Pulmonary HTN -see ischemic cardiomyopathy  CAD -S/P DES placed 02/2016 -See ischemic cardiomyopathy  Paroxysmal atrial fibrillation -Currently in NSR  Acute renal failure   Recent Labs Lab 12/20/16 0434 12/21/16 0400 12/22/16 0357 12/23/16 0338 12/24/16 0523 12/25/16 0156  CREATININE 0.72 0.73 0.85 1.18 1.15 1.02  Resolved  Dysphagia -9/21 patient failed swallow study continue tube feeds. -9/20 4 repeat swallow evaluation if patient fails will have to consider PEG tube placement -Counseled wife and son on why patient can have ice chips until we evaluate his swallow okay to moisten his lips with wet cloth.   Moderate protein calorie malnutrition -Continue tube feeds   Acute metabolic encephalopathy. -Multifactorial septic shock, elevated ammonia, metabolic encephalopathy -All sedating medication -Ammonia level normalized continue to monitor     Hyperglycemia -9/9 Hemoglobin A1c = 5.8, does not meet criteria for prediabetes/diabetes -9/21 increase  Lantus 20 units daily -Resistant SSI    Hypokalemia -Potassium goal> 4 -Potassium 40 mEq x 2 doses   BPH -Flomax 0.4 mg daily        DVT prophylaxis: SCD Code Status: DNR Family Communication: Wife and son present at bedside for discussion of plan of care Disposition Plan: TBD   Consultants:  IR [facility] M Orthopedic surgery GI Dr. Willis Modena   Procedures/Significant Events:  8/30 Admit 9/4 S/P LEFT intertrochantric intramedullary nailing  9/4 intubated 9/04  Surgery/ tx to ICU for hypotension 9/6 extubated 9/8 abdominal ultrasound: Negative gallstones, slight sludge in the gallbladder and there is slight diffuse thickening of the gallbladder wall. Negative sonographic Murphy's sign. 9/10: GI, surgical service and IR consulted     I have personally reviewed and interpreted all radiology studies and my findings are as above.  VENTILATOR SETTINGS:    Cultures 9/4 BC >> negative 9/4 UC >> E coli 9/11 blood culture>>>neg 9/12 perc drain >>> 9/12 c diff antigen positive, toxigenic pcr positive   Antimicrobials: Anti-infectives    Start     Dose/Rate Stop   12/18/16 0000  vancomycin (VANCOCIN) 50 mg/mL oral solution 500 mg     500 mg 12/31/16 0904   12/17/16 1800  vancomycin (VANCOCIN) 50 mg/mL oral solution 500 mg  Status:  Discontinued     500 mg 12/17/16 2333   12/16/16 1800  vancomycin (VANCOCIN) 50 mg/mL oral solution 125 mg  Status:  Discontinued     125 mg 12/17/16 1509   12/16/16 1800  cefTAZidime (FORTAZ) 2 g in dextrose 5 % 50 mL IVPB  Status:  Discontinued     2 g 100 mL/hr over 30 Minutes 12/21/16 1323   12/14/16 1215  piperacillin-tazobactam (ZOSYN) IVPB 3.375 g  Status:  Discontinued     3.375 g 12.5 mL/hr over 240 Minutes 12/16/16 1740   12/12/16 1200  ampicillin (OMNIPEN) 1 g in sodium chloride 0.9 % 50 mL IVPB  Status:  Discontinued     1 g 150 mL/hr over 20 Minutes 12/14/16 1127   12/09/16 0100  ceFAZolin (ANCEF) IVPB 1 g/50 mL premix     1 g 100 mL/hr over 30 Minutes  12/09/16 1557   12/08/16 2300  cefTRIAXone (ROCEPHIN) 1 g in dextrose 5 % 50 mL IVPB  Status:  Discontinued     1 g 100 mL/hr over 30 Minutes 12/12/16 1002   12/04/16 0600  ceFAZolin (ANCEF) IVPB 2g/100 mL premix     2 g 200 mL/hr over 30 Minutes 12/05/16 0559       Devices    LINES / TUBES:  Left Radial Aline 9/4 >>9/12 R TL IJ CVC 9/4  >>  9/20 ETT   9/4 >> 9/6 Perc GB drain 9/12>>     Continuous Infusions: . sodium chloride 10 mL/hr at 12/22/16 1400  . sodium chloride    . feeding supplement (VITAL AF 1.2 CAL) 1,000 mL (12/25/16 0901)  . norepinephrine (LEVOPHED) Adult infusion Stopped (12/22/16 0630)     Objective: Vitals:   12/24/16 2316 12/25/16 0323 12/25/16 0739 12/25/16 1158  BP: 104/64 95/60 117/66   Pulse: 65 60    Resp: 13 17 15    Temp:  (!) 96.2 F (35.7 C) (!) 97.5 F (36.4 C) (!) 97.2 F (36.2 C)  TempSrc:  Axillary Axillary Axillary  SpO2: 97% 100% 98%   Weight:  225 lb (102.1 kg)    Height:        Intake/Output  Summary (Last 24 hours) at 12/25/16 1254 Last data filed at 12/25/16 1100  Gross per 24 hour  Intake          2714.17 ml  Output             2325 ml  Net           389.17 ml   Filed Weights   12/23/16 0344 12/23/16 1807 12/25/16 0323  Weight: 237 lb 7 oz (107.7 kg) 230 lb 9.6 oz (104.6 kg) 225 lb (102.1 kg)   Physical Exam:  General: Alert, does not obey commands, not answering questions (just completed working with PT), No acute respiratory distress Eyes: negative scleral hemorrhage, positive icterus ENT: Negative Runny nose,  Positive gingival bleed, positive healing tongue lesions (from intubation) Neck:  Negative scars, masses, torticollis, lymphadenopathy, JVD, Lungs: Clear to auscultation bilaterally without wheezes or crackles Cardiovascular: Regular rate and rhythm without murmur gallop or rub normal S1 and S2 Abdomen:  Positive abdominal pain (RUQ) ,  positive distention,positive soft, bowel sounds, no rebound, no  ascites, no appreciable mass, anasarca, right percutaneous cholecystostomy drain  Extremities: No significant cyanosis, clubbing,  Anasarca Skin:  jaundice Central nervous system:   spontaneously moves all extremities, but not following commands      Data Reviewed: Care during the described time interval was provided by me .  I have reviewed this patient's available data, including medical history, events of note, physical examination, and all test results as part of my evaluation.   CBC:  Recent Labs Lab 12/20/16 0434 12/21/16 0400 12/23/16 0338 12/24/16 0523 12/25/16 0156  WBC 14.8* 12.1* 10.2 10.4 10.2  NEUTROABS  --   --   --  8.9* 9.1*  HGB 9.7* 9.3* 8.7* 9.9* 9.7*  HCT 30.6* 29.1* 27.4* 31.0* 31.3*  MCV 94.7 96.0 96.5 99.0 99.7  PLT 279 309 354 343 329   Basic Metabolic Panel:  Recent Labs Lab 12/20/16 0434 12/21/16 0400 12/22/16 0357 12/23/16 0338 12/24/16 0523 12/24/16 1940 12/25/16 0156  NA 140 141 139 143 143  --  145  K 3.8 4.0 4.0 3.6 3.6 3.5 3.4*  CL 110 111 111 110 112*  --  115*  CO2 --  22  GLUCOSE 162* 173* 229* 218* 258*  --  215*  BUN 46* 41* 52* 73* 85*  --  77*  CREATININE 0.72 0.73 0.85 1.18 1.15  --  1.02  CALCIUM 7.1* 7.0* 7.3* 7.7* 7.6*  --  7.8*  MG 1.9 1.8  --   --   --  2.2 2.2  PHOS 3.0 2.9  --   --   --   --   --    GFR: Estimated Creatinine Clearance: 68.9 mL/min (by C-G formula based on SCr of 1.02 mg/dL). Liver Function Tests:  Recent Labs Lab 12/19/16 0518 12/20/16 0434 12/21/16 0400 12/23/16 0338  AST 137* 131* 134* 123*  ALT 95* 104* 108* 111*  ALKPHOS 101 125 139* 102  BILITOT 18.6* 17.2* 14.0* 10.4*  PROT 4.7* 4.9* 4.9* 5.1*  ALBUMIN 1.3* 1.4* 1.3* 1.4*   No results for input(s): LIPASE, AMYLASE in the last 168 hours. No results for input(s): AMMONIA in the last 168 hours. Coagulation Profile:  Recent Labs Lab 12/22/16 0357  INR 1.50   Cardiac Enzymes: No results for input(s): CKTOTAL,  CKMB, CKMBINDEX, TROPONINI in the last 168 hours. BNP (last 3 results) No results for input(s): PROBNP in the last 8760 hours.  HbA1C: No results for input(s): HGBA1C in the last 72 hours. CBG:  Recent Labs Lab 12/24/16 1943 12/24/16 2319 12/25/16 0338 12/25/16 0741 12/25/16 1155  GLUCAP 235* 184* 198* 188* 171*   Lipid Profile: No results for input(s): CHOL, HDL, LDLCALC, TRIG, CHOLHDL, LDLDIRECT in the last 72 hours. Thyroid Function Tests: No results for input(s): TSH, T4TOTAL, FREET4, T3FREE, THYROIDAB in the last 72 hours. Anemia Panel: No results for input(s): VITAMINB12, FOLATE, FERRITIN, TIBC, IRON, RETICCTPCT in the last 72 hours. Urine analysis:    Component Value Date/Time   COLORURINE YELLOW 12/08/2016 2015   APPEARANCEUR TURBID (A) 12/08/2016 2015   LABSPEC 1.012 12/08/2016 2015   PHURINE 5.0 12/08/2016 2015   GLUCOSEU NEGATIVE 12/08/2016 2015   HGBUR LARGE (A) 12/08/2016 2015   BILIRUBINUR NEGATIVE 12/08/2016 2015   KETONESUR NEGATIVE 12/08/2016 2015   PROTEINUR 100 (A) 12/08/2016 2015   UROBILINOGEN 1.0 09/20/2013 0851   NITRITE NEGATIVE 12/08/2016 2015   LEUKOCYTESUR MODERATE (A) 12/08/2016 2015   Sepsis Labs: (procalcitonin:4,lacticidven:4)  ) Recent Results (from the past 240 hour(s))  Aerobic/Anaerobic Culture (surgical/deep wound)     Status: None   Collection Time: 12/15/16  4:49 PM  Result Value Ref Range Status   Specimen Description BILE  Final   Special Requests NONE  Final   Gram Stain NO WBC SEEN NO ORGANISMS SEEN   Final   Culture No growth aerobically or anaerobically.  Final   Report Status 12/20/2016 FINAL  Final  C difficile quick scan w PCR reflex     Status: Abnormal   Collection Time: 12/16/16  8:39 AM  Result Value Ref Range Status   C Diff antigen POSITIVE (A) NEGATIVE Final   C Diff toxin NEGATIVE NEGATIVE Final   C Diff interpretation Results are indeterminate. See PCR results.  Final  Clostridium Difficile by  PCR     Status: Abnormal   Collection Time: 12/16/16  8:39 AM  Result Value Ref Range Status   Toxigenic C Difficile by pcr POSITIVE (A) NEGATIVE Final    Comment: Positive for toxigenic C. difficile with little to no toxin production. Only treat if clinical presentation suggests symptomatic illness.         Radiology Studies: No results found.      Scheduled Meds: . aspirin  81 mg Per Tube Daily  . chlorhexidine  15 mL Mouth Rinse BID  . Chlorhexidine Gluconate Cloth  6 each Topical Daily  . fludrocortisone  0.1 mg Per Tube Daily  . hydrocortisone sod succinate (SOLU-CORTEF) inj  50 mg Intravenous Q6H  . insulin aspart  0-20 Units Subcutaneous Q4H  . insulin glargine  15 Units Subcutaneous QHS  . lactulose  30 g Oral Daily  . mouth rinse  15 mL Mouth Rinse q12n4p  . midodrine  10 mg Oral TID WC  . sodium chloride flush  10-40 mL Intracatheter Q12H  . sodium chloride flush  5 mL Intravenous Q8H  . vancomycin  500 mg Per Tube Q6H   Continuous Infusions: . sodium chloride 10 mL/hr at 12/22/16 1400  . sodium chloride    . feeding supplement (VITAL AF 1.2 CAL) 1,000 mL (12/25/16 0901)  . norepinephrine (LEVOPHED) Adult infusion Stopped (12/22/16 0630)     LOS: 22 days    Time spent: 40 minutes    Shaquela Weichert, Roselind Messier, MD [facility] Pager [phone]   If 7PM-7AM, please contact night-coverage [email or web address] Password [facility] 12/25/2016, 12:54 PM

## 2016-12-25 NOTE — Telephone Encounter (Signed)
Yes I understand and tried to explain this to pt. His number is [phone].

## 2016-12-25 NOTE — Progress Notes (Signed)
  Speech Language Pathology Treatment:    Patient Details Name: Danny Oneill MRN: [id number] DOB: 22-May-1931 Today's Date: 12/25/2016 Time: 1388-7195 SLP Time Calculation (min) (ACUTE ONLY): 46 min  Assessment / Plan / Recommendation Clinical Impression  Pt seen for swallow treatment with wife at bedside. Pt has had copious amounts of dried oral secretions this admission with RN providing focused oral care since admission. SLP educated pt and wife and removed dried mucous phlegm from hard, soft palate and tongue to decreased bacterial load when he aspirates his secretions.  Minimal laryngeal elevation with palpation and gurgly vocal quality. Pharyngeal swallow attempts incomplete with ice chip. Pt compliant, with much encouragement with oral care to a point. SLP stopped session when pt obviously too fatigued and verbalizing discomfort. Discussed with RN to continue oral care. Chlorhexidine schedule twice a day with mouthrinse in between.    HPI HPI: 83 yo male with PMH of HLD, lumbar spondylosis, MI, CAD, PAF, cardiomyopathy, CKD, CHF who was admitted on 8/30 with L hip fracture. He developed hypoxia and hypotension after surgery (L IM nail) on 9/4 and required re-intubation 9/4-9/6. Pt with septic shock from UTI, then found to have c diff, also with acute acaclulous colecystitis s/p perc drain 9/11. SLP ordered for swallow evaluation as pt's alertness began to improve.      SLP Plan  Continue with current plan of care       Recommendations  Diet recommendations: NPO Medication Administration: Via alternative means                Oral Care Recommendations: Oral care QID Follow up Recommendations:  (TBD) SLP Visit Diagnosis: Dysphagia, oropharyngeal phase (R13.12) Plan: Continue with current plan of care       GO                Royce Macadamia 12/25/2016, 4:39 PM   Breck Coons Maecie Sevcik M.Ed [employer] [phone]

## 2016-12-25 NOTE — Progress Notes (Signed)
Patient ID: Danny Oneill, male   DOB: Aug 28, 1931, 83 y.o.   MRN: [id number]    Referring Physician(s): Dr. Chilton Greathouse  Supervising Physician: Irish Lack  Patient Status: [facility] - In-pt  Chief Complaint: hyperbilirubinemia  Subjective: Patient is arousable, but AMS  Allergies: Amiodarone and Morphine and related  Medications: Prior to Admission medications   Medication Sig Start Date End Date Taking? Authorizing Provider  aspirin EC 81 MG tablet Take 81 mg by mouth daily with breakfast.    Yes [provider]  atorvastatin (LIPITOR) 20 MG tablet Take 1 tablet (20 mg total) by mouth daily. 04/07/16  Yes Lennette Bihari, MD  carvedilol (COREG) 3.125 MG tablet Take 3.125 mg by mouth 2 (two) times daily. 11/18/16  Yes [provider]  furosemide (LASIX) 20 MG tablet Take 1 tablet by mouth daily and ok to use extra tablet as needed for swelling. 10/14/16  Yes Cardama, Amadeo Garnet, MD  lisinopril (PRINIVIL,ZESTRIL) 2.5 MG tablet Take 1 tablet (2.5 mg total) by mouth daily. 09/21/16 12/20/16 Yes Lennette Bihari, MD  tamsulosin (FLOMAX) 0.4 MG CAPS capsule TAKE 1 CAPSULE(0.4 MG) BY MOUTH DAILY AFTER SUPPER 04/01/16  Yes Dunn, Tacey Ruiz, PA-C  ticagrelor (BRILINTA) 90 MG TABS tablet Take 1 tablet (90 mg total) by mouth 2 (two) times daily. 04/07/16  Yes Lennette Bihari, MD  carvedilol (COREG) 6.25 MG tablet Take 1 tablet (6.25 mg total) by mouth 2 (two) times daily. Patient not taking: Reported on 12/03/2016 04/28/16   Lennette Bihari, MD    Vital Signs: BP 117/66 (BP Location: Right Arm)   Pulse 60   Temp (!) 97.2 F (36.2 C) (Axillary)   Resp 15   Ht  (1.854 m)   Wt 225 lb (102.1 kg)   SpO2 98%   BMI 29.69 kg/m   Physical Exam: Abd: soft, perc chole drain with bilious, slightly bloody output present.  Drain bad was removed and a cap was placed on the drain.  Imaging: US Abdomen Limited Ruq  Result Date: 12/22/2016 CLINICAL DATA:  83 year old male with  elevated transaminases. Percutaneous cholecystotomy. Subsequent encounter. EXAM: ULTRASOUND ABDOMEN LIMITED RIGHT UPPER QUADRANT COMPARISON:  12/17/2016 plain film examination. 12/15/2016 cholecystogram. 12/12/2016 ultrasound. FINDINGS: Gallbladder: Cholecystostomy tube in place. Gallbladder sludge. Gallbladder wall thickening measuring up to 5.1 mm. Ultrasonographer was not able to determine if the patient was tender over this region secondary to bandage. Common bile duct: Diameter: 5.4 mm. Liver: No hepatic lesion or intrahepatic biliary duct dilation. Portal vein is patent on color Doppler imaging with normal direction of blood flow towards the liver. Ascites.  Right-sided pleural effusion. IMPRESSION: Cholecystostomy tube in place. Gallbladder sludge. Gallbladder wall thickening measuring up to 5.1 mm. Ultrasonographer was not able to determine if the patient was tender over this region secondary to bandage. No hepatic lesion or intrahepatic biliary duct dilation. Ascites. Right-sided pleural effusion. Electronically Signed   By: Lacy Duverney M.D.   On: 12/22/2016 12:34    Labs:  CBC:  Recent Labs  12/21/16 0400 12/23/16 0338 12/24/16 0523 12/25/16 0156  WBC 12.1* 10.2 10.4 10.2  HGB 9.3* 8.7* 9.9* 9.7*  HCT 29.1* 27.4* 31.0* 31.3*  PLT 309 354 343 329    COAGS:  Recent Labs  02/02/16 2100 12/08/16 2301 12/15/16 0948 12/17/16 0420 12/22/16 0357  INR 1.29 2.34 1.68 1.75 1.50  APTT 81*  --  33  --  37*    BMP:  Recent Labs  12/22/16 0357 12/23/16  4818 12/24/16 0523 12/24/16 1940 12/25/16 0156  NA 139 143 143  --  145  K 4.0 3.6 3.6 3.5 3.4*  CL 111 110 112*  --  115*  CO2 23 24 22   --  22  GLUCOSE 229* 218* 258*  --  215*  BUN 52* 73* 85*  --  77*  CALCIUM 7.3* 7.7* 7.6*  --  7.8*  CREATININE 0.85 1.18 1.15  --  1.02  GFRNONAA >60 55* 57*  --  >60  GFRAA >60 >60 >60  --  >60    LIVER FUNCTION TESTS:  Recent Labs  12/19/16 0518 12/20/16 0434 12/21/16 0400  12/23/16 0338  BILITOT 18.6* 17.2* 14.0* 10.4*  AST 137* 131* 134* 123*  ALT 95* 104* 108* 111*  ALKPHOS 101 125 139* 102  PROT 4.7* 4.9* 4.9* 5.1*  ALBUMIN 1.3* 1.4* 1.3* 1.4*    Assessment and Plan: 1. Hyperbilirubinemia, s/p perc chole drain  Case discussed with Dr. Fredia Sorrow, since the patient never had cholecystitis he does not feel a drain injection is warranted before capping.  I have capped the drain today and we will see how he does with this.  If he were to develop worsening abdominal pain or significant drainage around the catheter then his drain would need to be returned to a drainage bag.  We will follow to see how he does.  Electronically Signed: Letha Cape 12/25/2016, 1:31 PM   I spent a total of 15 Minutes at the the patient's bedside AND on the patient's hospital floor or unit, greater than 50% of which was counseling/coordinating care for hyperbilirubinemia

## 2016-12-25 NOTE — Care Management Note (Addendum)
Case Management Note  Patient Details  Name: Danny Oneill MRN: [id number] Date of Birth: [birth year]  Subjective/Objective:              Patient from home w wife, admitted after falling at restaurant. Ortho consult states Dr. Aundria Rud will do a Left Hip nailing 8/31. Pt eval after sx, mat need SNF. CM and CSW will continue to follow.      Action/Plan:   Expected Discharge Date:                  Expected Discharge Plan:     In-House Referral:  Clinical Social Work  Discharge planning Services  CM Consult  Post Acute Care Choice:    Choice offered to:     DME Arranged:    DME Agency:     HH Arranged:    HH Agency:     Status of Service:  In process, will continue to follow  If discussed at Long Length of Stay Meetings, dates discussed:    Additional Comments: 12/25/2016  Pt transferred to SD.  Pt continues remains on tube feed via NG tube, and continues to have biliary tube, IV solumedrol.  Discharge plan will likely be SNF - CSW informed of tentative consult as PT eval is pending  12/22/16 Discussed pt in LOS 9/18 - pt remains appropriate for continued stay.  Pt remains on pressor.  Pt will need PT eval once stable.  CM will continue to follow for discharge needs  12/15/16 Discussed in LOS 12/17/16 - pt remains appropriate for continued stay.  Pt remains on norepi drip, has Gallbladder tube, on tube feeds and IV antibiotics   12/25/2016  Discussed in LOS 9/11 - pt remains appropriate for continued stay.  Pt remains on vasopressors, liver functioning worsening, septic shock in need of perc drain. CM will continue to follow for discharge needs  Cherylann Parr, RN 12/25/2016, 10:04 AM

## 2016-12-25 NOTE — Progress Notes (Signed)
PT refuses BiPaP RT will continue to monitor

## 2016-12-25 NOTE — Telephone Encounter (Signed)
Spoke with pt's son and tried to help answer his questions but he only wanted to talk to [place]. I will route message to Cindee Lame so he can call his son.

## 2016-12-26 DIAGNOSIS — R1314 Dysphagia, pharyngoesophageal phase: Secondary | ICD-10-CM

## 2016-12-26 LAB — COMPREHENSIVE METABOLIC PANEL
ALT: 120 U/L — AB (ref 17–63)
AST: 77 U/L — AB (ref 15–41)
Albumin: 1.7 g/dL — ABNORMAL LOW (ref 3.5–5.0)
Alkaline Phosphatase: 127 U/L — ABNORMAL HIGH (ref 38–126)
Anion gap: 5 (ref 5–15)
BILIRUBIN TOTAL: 7.1 mg/dL — AB (ref 0.3–1.2)
BUN: 67 mg/dL — ABNORMAL HIGH (ref 6–20)
CALCIUM: 7.8 mg/dL — AB (ref 8.9–10.3)
CO2: 22 mmol/L (ref 22–32)
CREATININE: 0.85 mg/dL (ref 0.61–1.24)
Chloride: 122 mmol/L — ABNORMAL HIGH (ref 101–111)
Glucose, Bld: 199 mg/dL — ABNORMAL HIGH (ref 65–99)
Potassium: 4 mmol/L (ref 3.5–5.1)
Sodium: 149 mmol/L — ABNORMAL HIGH (ref 135–145)
TOTAL PROTEIN: 5.1 g/dL — AB (ref 6.5–8.1)

## 2016-12-26 LAB — BASIC METABOLIC PANEL
Anion gap: 5 (ref 5–15)
BUN: 70 mg/dL — ABNORMAL HIGH (ref 6–20)
CALCIUM: 7.9 mg/dL — AB (ref 8.9–10.3)
CO2: 23 mmol/L (ref 22–32)
CREATININE: 0.86 mg/dL (ref 0.61–1.24)
Chloride: 119 mmol/L — ABNORMAL HIGH (ref 101–111)
Glucose, Bld: 180 mg/dL — ABNORMAL HIGH (ref 65–99)
Potassium: 4.3 mmol/L (ref 3.5–5.1)
SODIUM: 147 mmol/L — AB (ref 135–145)

## 2016-12-26 LAB — CBC
HCT: 33.1 % — ABNORMAL LOW (ref 39.0–52.0)
Hemoglobin: 10.3 g/dL — ABNORMAL LOW (ref 13.0–17.0)
MCH: 32.2 pg (ref 26.0–34.0)
MCHC: 31.1 g/dL (ref 30.0–36.0)
MCV: 103.4 fL — AB (ref 78.0–100.0)
PLATELETS: 325 10*3/uL (ref 150–400)
RBC: 3.2 MIL/uL — AB (ref 4.22–5.81)
RDW: 28.7 % — AB (ref 11.5–15.5)
WBC: 12.7 10*3/uL — AB (ref 4.0–10.5)

## 2016-12-26 LAB — GLUCOSE, CAPILLARY
GLUCOSE-CAPILLARY: 163 mg/dL — AB (ref 65–99)
GLUCOSE-CAPILLARY: 172 mg/dL — AB (ref 65–99)
GLUCOSE-CAPILLARY: 165 mg/dL — AB (ref 65–99)
Glucose-Capillary: 152 mg/dL — ABNORMAL HIGH (ref 65–99)
Glucose-Capillary: 167 mg/dL — ABNORMAL HIGH (ref 65–99)
Glucose-Capillary: 174 mg/dL — ABNORMAL HIGH (ref 65–99)
Glucose-Capillary: 204 mg/dL — ABNORMAL HIGH (ref 65–99)

## 2016-12-26 LAB — MAGNESIUM: MAGNESIUM: 2.2 mg/dL (ref 1.7–2.4)

## 2016-12-26 LAB — AMMONIA: Ammonia: 30 umol/L (ref 9–35)

## 2016-12-26 MED ORDER — ZOLPIDEM TARTRATE 5 MG PO TABS
5.0000 mg | ORAL_TABLET | Freq: Every evening | ORAL | Status: DC | PRN
  Administered 2016-12-26: 5 mg via ORAL
  Filled 2016-12-26: qty 1

## 2016-12-26 MED ORDER — FENTANYL CITRATE (PF) 100 MCG/2ML IJ SOLN: 100.0000 ug | Freq: Once | INTRAMUSCULAR | Status: DC

## 2016-12-26 MED ORDER — INSULIN GLARGINE 100 UNIT/ML ~~LOC~~ SOLN
25.0000 [IU] | Freq: Every day | SUBCUTANEOUS | Status: DC
  Administered 2016-12-26 – 2016-12-27 (×2): 25 [IU] via SUBCUTANEOUS
  Filled 2016-12-26 (×3): qty 0.25

## 2016-12-26 NOTE — Progress Notes (Signed)
PROGRESS NOTE    Danny Oneill  ZOX:[id number] DOB: 1931-10-12 DOA: 12/03/2016 PCP: Geoffry Paradise, MD   Brief Narrative:  83 y.o. WM PMHx Asbestosis, MI, Ischemic Cardiomyopathy, Chronic Systolic and Diastolic CHF (EF 81-19% on 03/04/2016), First Degree AV block, Paroxysmal Atrial Fibrillation, Pericardial Effusion 02/2016 HLD, CAD, Bladder Outlet Obstruction, CKD stage III   Presents with hip pain. Patient has been well. Taking medications as prescribed. At lunch chair slid from underneath him and patient hit the floor. Instant pain. EMS activated. Patient with physical findings of hip fracture. Transported to the emergency room for further evaluation.     ED course: Patient brought to the emergency room. Pain control with fentanyl. X-ray confirmed hip fracture.    Subjective: 9/22 await, agitated, not following commands. Earlier in the day progressed and granddaughter, refused medication or labs (new onset). Positive RUQ/right flank abdominal pain. Negative SOB        Assessment & Plan:   Principal Problem:   Closed left hip fracture (HCC) Active Problems:   Hyperlipidemia   CAD (coronary artery disease)   CKD (chronic kidney disease), stage III   Hip fracture (HCC)   Compromised airway   Hypovolemic shock (HCC)   Elevated troponin I level   Chronic combined systolic and diastolic heart failure (HCC)   Acute respiratory failure (HCC)   Elevated LFTs   LEFT hip fracture -9/4/ S/P LEFT intertrochanteric intramedullary nailing. -Left hip appropriately swollen, negative sign of infection.   Septic shock/positive E coli UTI; positive C diff colitis -Multifactorial Escherichia coli UTI, C. difficile infection, acalculous cholecystitis -Complete 2 week course PO Vancomycin  -Patient's cholecystostomy tube was capped yesterday afternoon, now patient with RUQ abdominal pain, leukocytosis, altered mental status. Early Infection? Obstruction? Obtain panculture. If patient  continues to worsen obtain abdominal CT/ultrasound   Shock/relative Adrenal insufficiency/Hypotension -Continue stress dose steroids Solu-Cortef 50 mg TID -Fludrocortisone 0.1 mg daily -Midodrin 10 mg TID -MAP goal> 65  Acute Acalculous Colecystitis  -abd [place] w/ GB sludge and diffuse thickening;  + murphy's sign (continued mild tenderness to palpation) -9/22  S/P percutaneous cholecystectomy drain On 9/21, now with leukocytosis, altered mental status. See septic shock   Elevated LFTs -Improving continue to monitor    Elevated ammonia -Normalized -Lactulose 30 g daily  Acute respiratory failure with hypoxia/Asbestosis -Extubated 9/6 -Currently on room air  -Titrate O2 to maintain SPO2 > 93%     Ischemic Cardiomyopathy/Chronic Systolic and Diastolic CHF -Echocardiogram 2/8 EF 45-50%.-Grade 1 diastolic dysfunction.-PA peak pressure 51 mmHg -Strict in and out since admission +10 L -Daily weight Filed Weights   12/23/16 1807 12/25/16 0323 12/26/16 0429  Weight: 230 lb 9.6 oz (104.6 kg) 225 lb (102.1 kg) 220 lb 7.4 oz (100 kg)  -Current BP would not support traditional CHF medication -If/When BP improves consider restarting Brilinta: Also need to use her LFTs have improved. CMP pending  -Continue ASA  Pulmonary HTN -see ischemic cardiomyopathy  CAD -S/P DES placed 02/2016 -See ischemic cardiomyopathy  Paroxysmal atrial fibrillation -Currently in NSR  Acute renal failure    Recent Labs Lab 12/21/16 0400 12/22/16 0357 12/23/16 0338 12/24/16 0523 12/25/16 0156 12/26/16 0713  CREATININE 0.73 0.85 1.18 1.15 1.02 0.86  Resolved  Dysphagia -9/21 patient failed swallow study continue tube feeds. -9/24 repeat swallow evaluation if patient fails will have to consider PEG tube placement -Counseled wife and son on why patient can have ice chips until we evaluate his swallow okay to moisten his lips with wet cloth.  Moderate protein calorie malnutrition -Continue tube  feeds   Acute metabolic encephalopathy. -Multifactorial septic shock, elevated ammonia, metabolic encephalopathy -9/22 overnight worsening encephalopathy, agitation -Ammonia level pending      Hyperglycemia -9/9 Hemoglobin A1c = 5.8, does not meet criteria for prediabetes/diabetes -9/22 increase Lantus 25 units daily -Resistant SSI   Hypokalemia -Potassium goal> 4  BPH -Flomax 0.4 mg daily        DVT prophylaxis: SCD Code Status: DNR Family Communication: Daughter present at bedside for discussion of plan care  Disposition Plan: TBD   Consultants:  IR [facility] M Orthopedic surgery GI Dr. Willis Modena   Procedures/Significant Events:  8/30 Admit 9/4 S/P LEFT intertrochantric intramedullary nailing  9/4 intubated 9/04  Surgery/ tx to ICU for hypotension 9/6 extubated 9/8 abdominal ultrasound: Negative gallstones, slight sludge in the gallbladder and there is slight diffuse thickening of the gallbladder wall. Negative sonographic Murphy's sign. 9/10: GI, surgical service and IR consulted  9/21 IR capped percutaneous cholecystostomy drain   I have personally reviewed and interpreted all radiology studies and my findings are as above.  VENTILATOR SETTINGS:    Cultures 9/4 BC >> negative 9/4 UC >> E coli 9/11 blood culture>>>neg 9/12 perc drain >>> 9/12 c diff antigen positive, toxigenic pcr positive 9/22 blood pending  9/22 urine pending     Antimicrobials: Anti-infectives    Start     Dose/Rate Stop   12/18/16 0000  vancomycin (VANCOCIN) 50 mg/mL oral solution 500 mg     500 mg 12/31/16 0904   12/17/16 1800  vancomycin (VANCOCIN) 50 mg/mL oral solution 500 mg  Status:  Discontinued     500 mg 12/17/16 2333   12/16/16 1800  vancomycin (VANCOCIN) 50 mg/mL oral solution 125 mg  Status:  Discontinued     125 mg 12/17/16 1509   12/16/16 1800  cefTAZidime (FORTAZ) 2 g in dextrose 5 % 50 mL IVPB  Status:  Discontinued     2 g 100 mL/hr over 30 Minutes  12/21/16 1323   12/14/16 1215  piperacillin-tazobactam (ZOSYN) IVPB 3.375 g  Status:  Discontinued     3.375 g 12.5 mL/hr over 240 Minutes 12/16/16 1740   12/12/16 1200  ampicillin (OMNIPEN) 1 g in sodium chloride 0.9 % 50 mL IVPB  Status:  Discontinued     1 g 150 mL/hr over 20 Minutes 12/14/16 1127   12/09/16 0100  ceFAZolin (ANCEF) IVPB 1 g/50 mL premix     1 g 100 mL/hr over 30 Minutes 12/09/16 1557   12/08/16 2300  cefTRIAXone (ROCEPHIN) 1 g in dextrose 5 % 50 mL IVPB  Status:  Discontinued     1 g 100 mL/hr over 30 Minutes 12/12/16 1002   12/04/16 0600  ceFAZolin (ANCEF) IVPB 2g/100 mL premix     2 g 200 mL/hr over 30 Minutes 12/05/16 0559       Devices    LINES / TUBES:  Left Radial Aline 9/4 >>9/12 R TL IJ CVC 9/4  >>  9/20 ETT   9/4 >> 9/6 Perc GB drain 9/12>> capped on 9/21     Continuous Infusions: . sodium chloride 10 mL/hr at 12/22/16 1400  . sodium chloride    . feeding supplement (VITAL AF 1.2 CAL) 1,000 mL (12/26/16 0254)     Objective: Vitals:   12/25/16 2353 12/26/16 0346 12/26/16 0429 12/26/16 0700  BP: 107/76 106/89  109/61  Pulse: 64 66    Resp: 18 (!) 21  17  Temp: 97.8  F (36.6 C) (!) 97.5 F (36.4 C)  (!) 97.4 F (36.3 C)  TempSrc: Axillary Axillary  Axillary  SpO2: 98% 99%  100%  Weight:   220 lb 7.4 oz (100 kg)   Height:        Intake/Output Summary (Last 24 hours) at 12/26/16 1240 Last data filed at 12/26/16 1103  Gross per 24 hour  Intake              510 ml  Output             2225 ml  Net            -1715 ml   Filed Weights   12/23/16 1807 12/25/16 0323 12/26/16 0429  Weight: 230 lb 9.6 oz (104.6 kg) 225 lb (102.1 kg) 220 lb 7.4 oz (100 kg)   Physical Exam: General: Alert, Does not follow commands, mumbling unintelligibly, agitated, No acute respiratory distress Eyes: negative scleral hemorrhage, positive icterus ENT: Negative Runny nose,  Positive dried blood tongue/gingival , positive healing tongue lesions (from  intubation) Neck:  Negative scars, masses, torticollis, lymphadenopathy, JVD, Lungs: Clear to auscultation bilaterally without wheezes or crackles Cardiovascular: Regular rate and rhythm without murmur gallop or rub normal S1 and S2 Abdomen:  Positive abdominal pain (RUQ) to palpation,  positive distention (increasing?) ,positive soft, bowel sounds, no rebound, no ascites, no appreciable mass, anasarca, right percutaneous cholecystostomy drain  Extremities: No significant cyanosis, clubbing,  Anasarca Skin:  jaundice Central nervous system:   spontaneously moves all extremities, but not following commands      Data Reviewed: Care during the described time interval was provided by me .  I have reviewed this patient's available data, including medical history, events of note, physical examination, and all test results as part of my evaluation.   CBC:  Recent Labs Lab 12/21/16 0400 12/23/16 0338 12/24/16 0523 12/25/16 0156 12/26/16 1116  WBC 12.1* 10.2 10.4 10.2 12.7*  NEUTROABS  --   --  8.9* 9.1*  --   HGB 9.3* 8.7* 9.9* 9.7* 10.3*  HCT 29.1* 27.4* 31.0* 31.3* 33.1*  MCV 96.0 96.5 99.0 99.7 103.4*  PLT 309 354 343 329 325   Basic Metabolic Panel:  Recent Labs Lab 12/20/16 0434 12/21/16 0400 12/22/16 0357 12/23/16 0338 12/24/16 0523 12/24/16 1940 12/25/16 0156 12/26/16 0713  NA 140 141 139 143 143  --  145 147*  K 3.8 4.0 4.0 3.6 3.6 3.5 3.4* 4.3  CL 110 111 111 110 112*  --  115* 119*  CO2 --  22 23  GLUCOSE 162* 173* 229* 218* 258*  --  215* 180*  BUN 46* 41* 52* 73* 85*  --  77* 70*  CREATININE 0.72 0.73 0.85 1.18 1.15  --  1.02 0.86  CALCIUM 7.1* 7.0* 7.3* 7.7* 7.6*  --  7.8* 7.9*  MG 1.9 1.8  --   --   --  2.2 2.2 2.2  PHOS 3.0 2.9  --   --   --   --   --   --    GFR: Estimated Creatinine Clearance: 80.9 mL/min (by C-G formula based on SCr of 0.86 mg/dL). Liver Function Tests:  Recent Labs Lab 12/20/16 0434 12/21/16 0400 12/23/16 0338    AST 131* 134* 123*  ALT 104* 108* 111*  ALKPHOS 125 139* 102  BILITOT 17.2* 14.0* 10.4*  PROT 4.9* 4.9* 5.1*  ALBUMIN 1.4* 1.3* 1.4*   No results for input(s): LIPASE,  AMYLASE in the last 168 hours. No results for input(s): AMMONIA in the last 168 hours. Coagulation Profile:  Recent Labs Lab 12/22/16 0357  INR 1.50   Cardiac Enzymes: No results for input(s): CKTOTAL, CKMB, CKMBINDEX, TROPONINI in the last 168 hours. BNP (last 3 results) No results for input(s): PROBNP in the last 8760 hours. HbA1C: No results for input(s): HGBA1C in the last 72 hours. CBG:  Recent Labs Lab 12/25/16 2352 12/26/16 0414 12/26/16 0751 12/26/16 0911 12/26/16 1208  GLUCAP 220* 174* 163* 172* 204*   Lipid Profile: No results for input(s): CHOL, HDL, LDLCALC, TRIG, CHOLHDL, LDLDIRECT in the last 72 hours. Thyroid Function Tests: No results for input(s): TSH, T4TOTAL, FREET4, T3FREE, THYROIDAB in the last 72 hours. Anemia Panel: No results for input(s): VITAMINB12, FOLATE, FERRITIN, TIBC, IRON, RETICCTPCT in the last 72 hours. Urine analysis:    Component Value Date/Time   COLORURINE YELLOW 12/08/2016 2015   APPEARANCEUR TURBID (A) 12/08/2016 2015   LABSPEC 1.012 12/08/2016 2015   PHURINE 5.0 12/08/2016 2015   GLUCOSEU NEGATIVE 12/08/2016 2015   HGBUR LARGE (A) 12/08/2016 2015   BILIRUBINUR NEGATIVE 12/08/2016 2015   KETONESUR NEGATIVE 12/08/2016 2015   PROTEINUR 100 (A) 12/08/2016 2015   UROBILINOGEN 1.0 09/20/2013 0851   NITRITE NEGATIVE 12/08/2016 2015   LEUKOCYTESUR MODERATE (A) 12/08/2016 2015   Sepsis Labs: (procalcitonin:4,lacticidven:4)  ) No results found for this or any previous visit (from the past 240 hour(s)).       Radiology Studies: No results found.      Scheduled Meds: . aspirin  81 mg Per Tube Daily  . chlorhexidine  15 mL Mouth Rinse BID  . Chlorhexidine Gluconate Cloth  6 each Topical Daily  . fludrocortisone  0.1 mg Per Tube Daily  .  hydrocortisone sod succinate (SOLU-CORTEF) inj  50 mg Intravenous Q6H  . insulin aspart  0-20 Units Subcutaneous Q4H  . insulin glargine  20 Units Subcutaneous QHS  . lactulose  30 g Oral Daily  . mouth rinse  15 mL Mouth Rinse q12n4p  . midodrine  10 mg Oral TID WC  . tamsulosin  0.4 mg Oral Daily  . vancomycin  500 mg Per Tube Q6H   Continuous Infusions: . sodium chloride 10 mL/hr at 12/22/16 1400  . sodium chloride    . feeding supplement (VITAL AF 1.2 CAL) 1,000 mL (12/26/16 0254)     LOS: 23 days    Time spent: 40 minutes    WOODS, Roselind Messier, MD [facility] Pager [phone]   If 7PM-7AM, please contact night-coverage [email or web address] Password [facility] 12/26/2016, 12:40 PM

## 2016-12-27 DIAGNOSIS — K810 Acute cholecystitis: Secondary | ICD-10-CM

## 2016-12-27 DIAGNOSIS — R1312 Dysphagia, oropharyngeal phase: Secondary | ICD-10-CM

## 2016-12-27 DIAGNOSIS — B370 Candidal stomatitis: Secondary | ICD-10-CM

## 2016-12-27 LAB — GLUCOSE, CAPILLARY
GLUCOSE-CAPILLARY: 145 mg/dL — AB (ref 65–99)
GLUCOSE-CAPILLARY: 157 mg/dL — AB (ref 65–99)
Glucose-Capillary: 155 mg/dL — ABNORMAL HIGH (ref 65–99)
Glucose-Capillary: 182 mg/dL — ABNORMAL HIGH (ref 65–99)
Glucose-Capillary: 182 mg/dL — ABNORMAL HIGH (ref 65–99)

## 2016-12-27 LAB — BASIC METABOLIC PANEL
Anion gap: 5 (ref 5–15)
BUN: 68 mg/dL — AB (ref 6–20)
CHLORIDE: 123 mmol/L — AB (ref 101–111)
CO2: 25 mmol/L (ref 22–32)
Calcium: 8.1 mg/dL — ABNORMAL LOW (ref 8.9–10.3)
Creatinine, Ser: 0.88 mg/dL (ref 0.61–1.24)
GFR calc Af Amer: >60 mL/min (ref >60)
GFR calc non Af Amer: >60 mL/min (ref >60)
GLUCOSE: 194 mg/dL — AB (ref 65–99)
POTASSIUM: 4.5 mmol/L (ref 3.5–5.1)
SODIUM: 153 mmol/L — AB (ref 135–145)

## 2016-12-27 LAB — URINE CULTURE: Culture: NO GROWTH

## 2016-12-27 LAB — MAGNESIUM: MAGNESIUM: 2.3 mg/dL (ref 1.7–2.4)

## 2016-12-27 MED ORDER — MAGIC MOUTHWASH
5.0000 mL | Freq: Three times a day (TID) | ORAL | Status: DC
  Administered 2016-12-27 – 2017-01-15 (×51): 5 mL via ORAL
  Filled 2016-12-27 (×55): qty 5

## 2016-12-27 MED ORDER — FREE WATER
300.0000 mL | Freq: Four times a day (QID) | Status: DC
  Administered 2016-12-27 – 2016-12-28 (×4): 300 mL

## 2016-12-27 MED ORDER — FLUCONAZOLE IN SODIUM CHLORIDE 400-0.9 MG/200ML-% IV SOLN
400.0000 mg | INTRAVENOUS | Status: AC
  Administered 2016-12-27 – 2017-01-05 (×10): 400 mg via INTRAVENOUS
  Filled 2016-12-27 (×11): qty 200

## 2016-12-27 MED ORDER — FUROSEMIDE 10 MG/ML IJ SOLN
40.0000 mg | Freq: Once | INTRAMUSCULAR | Status: AC
  Administered 2016-12-27: 40 mg via INTRAVENOUS
  Filled 2016-12-27: qty 4

## 2016-12-27 MED ORDER — DEXTROSE 5 % IV SOLN
INTRAVENOUS | Status: DC
  Administered 2016-12-27: 50 mL via INTRAVENOUS

## 2016-12-27 MED ORDER — ALBUMIN HUMAN 25 % IV SOLN
50.0000 g | Freq: Once | INTRAVENOUS | Status: AC
  Administered 2016-12-27: 50 g via INTRAVENOUS
  Filled 2016-12-27: qty 200

## 2016-12-27 NOTE — Progress Notes (Signed)
Pharmacy Antibiotic Note  Danny Oneill is a 83 y.o. male admitted on 12/03/2016 with L hip fracture s/p repair. On oral vancomycin for c.diff. Now starting fluconazole for esophageal candidiasis.   Ceftriaxone 9/4>>9/8 Ampicillin 9/8>> 9/10 Zosyn 9/10>>9/12 < switched to fortaz due to concern of liver injury Ceftazidime 9/12>>9/17 PO Vancomycin 9/12>> (10d after abx stopped)  9/12 Cdiff >> positive 9/11 Bile (perc drain) >> NGTD 9/4 urine >> e.coli  9/4 blood x 2 > ngtd  8/30 MRSA pcr > neg   Plan: Fluconazole 400 mg IV daily Change to PO when appropriate  Height: 6\' 1"  (185.4 cm) Weight: 224 lb 6.9 oz (101.8 kg) IBW/kg (Calculated) : 79.9  Temp (24hrs), Avg:97.3 F (36.3 C), Min:96.8 F (36 C), Max:97.5 F (36.4 C)   Recent Labs Lab 12/21/16 0400  12/23/16 0338 12/24/16 0523 12/25/16 0156 12/26/16 0713 12/26/16 1116 12/26/16 1433 12/27/16 1136  WBC 12.1*  --  10.2 10.4 10.2  --  12.7*  --   --   CREATININE 0.73  < > 1.18 1.15 1.02 0.86  --  0.85 0.88  < > = values in this interval not displayed.  Estimated Creatinine Clearance: 79.8 mL/min (by C-G formula based on SCr of 0.88 mg/dL).    Allergies  Allergen Reactions  . Amiodarone Rash  . Morphine And Related Other (See Comments)    Confusion/ hallucinations      Danny Oneill 12/27/2016 4:03 PM

## 2016-12-27 NOTE — Progress Notes (Signed)
PROGRESS NOTE    Danny Oneill  DGU:[id number] DOB: Sep 11, 1931 DOA: 12/03/2016 PCP: Geoffry Paradise, MD   Brief Narrative:  83 y.o. WM PMHx Asbestosis, MI, Ischemic Cardiomyopathy, Chronic Systolic and Diastolic CHF (EF 95-63% on 03/04/2016), First Degree AV block, Paroxysmal Atrial Fibrillation, Pericardial Effusion 02/2016 HLD, CAD, Bladder Outlet Obstruction, CKD stage III   Presents with hip pain. Patient has been well. Taking medications as prescribed. At lunch chair slid from underneath him and patient hit the floor. Instant pain. EMS activated. Patient with physical findings of hip fracture. Transported to the emergency room for further evaluation.     ED course: Patient brought to the emergency room. Pain control with fentanyl. X-ray confirmed hip fracture.    Subjective: 9/23 patient more alert today. Will follow some of daughter's commands. Positive aphasia (partially due to candidiasis/tongue lesions), negative CP, negative SOB, significantly decreased abdominal pain.          Assessment & Plan:   Principal Problem:   Closed left hip fracture (HCC) Active Problems:   Hyperlipidemia   CAD (coronary artery disease)   CKD (chronic kidney disease), stage III   Hip fracture (HCC)   Compromised airway   Hypovolemic shock (HCC)   Elevated troponin I level   Chronic combined systolic and diastolic heart failure (HCC)   Acute respiratory failure (HCC)   Elevated LFTs   LEFT hip fracture -9/4/ S/P LEFT intertrochanteric intramedullary nailing. -Left hip appropriately swollen, negative sign of infection   Septic shock/positive E coli UTI; positive C diff colitis -Multifactorial: Escherichia coli UTI, C. difficile colitis, acalculous cholecystitis  -Septic physiology slowly clearing -Complete 2 week course PO Vancomycin   -Cholecystostomy tube remains capped (On 9/21), RUQ abdominal pain significantly decreased today. Continued altered mental status but improved.    -Blood/urine culture pending. If mental status does not continue to improve or RUQ abdominal pain increases would obtain abdominal CT/ultrasound (obstruction? )  Shock/relative Adrenal insufficiency/Hypotension -9/23 BP starting to improve. -For now continue stress dose steroids: Solu-Cortef 50 mg TID -Fludrocortisone 0.1 mg daily -Midodrin 10 mg TID -Albumin 50 g 1  -MAP goal> 65  Acute Acalculous Colecystitis  -Abdominal ultrasound with gallbladder sludge/diffuse thickening + Murphy sign -Positive icterus, positive jaundice -9/11 S/P percutaneous cholecystostomy drain placed: Capped On 9/21  -Decreased RUQ abdominal pain, decreased jaundiced decrease icterus  Elevated LFTs -Improving continue to monitor    Increased ammonia level -Monitor closely Recent Labs Lab 12/26/16 1433  AMMONIA 30  -Lactulose 30 g daily  Acute respiratory failure with hypoxia/Asbestosis -Extubated 9/6 -Currently on room air -Titrate O2 to maintain SPO2> 93%     Ischemic Cardiomyopathy/Chronic Systolic and Diastolic CHF -Echocardiogram 2/8 LVEF 45-50%.-Grade 1 diastolic dysfunction.-PA peak pressure 51 mmHg -Strict in and out since admission +10.7L -Daily weight Filed Weights   12/25/16 0323 12/26/16 0429 12/27/16 0341  Weight: 225 lb (102.1 kg) 220 lb 7.4 oz (100 kg) 224 lb 6.9 oz (101.8 kg)  -BP improving but still on the low side, would not start CHF medication at this point. -If/when BP increases will restart Brilinta, provided LFTs have improved -Gross extravascular fluid overload. Will attempt to gently diuresis after receiving albumin, Lasix 40 mg 1 -NOTE: Patient receiving D5W for hypernatremia, will want to discontinue as soon as possible after correction of sodium level. -Continue ASA -PCXR on 9/24  Pulmonary HTN -see ischemic cardiomyopathy  CAD -S/P DES placed 02/2016 -See ischemic cardiomyopathy  Paroxysmal atrial fibrillation -Currently in NSR  Acute renal failure  Recent Labs Lab 12/23/16 0338 12/24/16 0523 12/25/16 0156 12/26/16 0713 12/26/16 1433 12/27/16 1136  CREATININE 1.18 1.15 1.02 0.86 0.85 0.88  Resolved  Dysphagia -9/21 patient failed swallow study continue tube feeds. -924 repeat swallow evaluation if patient fails will have to consider PEG tube placement. -Counseled wife and son on why patient can have ice chips until we evaluate his swallow okay to moisten his lips with wet cloth.   Moderate protein calorie malnutrition -Continue tube feeds   Acute metabolic encephalopathy. -Multifactorial septic shock, elevated ammonia, metabolic encephalopathy, hypernatremia, mild uremia -9/23 patient remains encephalopathic but slight improvement, some agitation but redirectable by his daughter -Ammonia level: WNL   Hyperglycemia -9/9 Hemoglobin A1c = 5.8, does not meet criteria for prediabetes/diabetes -9/22 increase Lantus 25 units daily -Resistant SSI   Hypokalemia -Potassium goal> 4   Hypernatremia -Free water QID -D5 W 23ml/hr  BPH -Flomax 0.4 mg daily  Oral candidiasis -Diflucan per pharmacy -Magic mouthwash        DVT prophylaxis: SCD Code Status: DNR Family Communication: Daughter present at bedside for discussion of plan care  Disposition Plan: TBD   Consultants:  IR [name] M Orthopedic surgery GI Dr. Willis Modena   Procedures/Significant Events:  8/30 Admit 9/4 S/P LEFT intertrochantric intramedullary nailing  9/4 intubated 9/04  Surgery/ tx to ICU for hypotension 9/6 extubated 9/8 abdominal ultrasound: Negative gallstones, slight sludge in the gallbladder and there is slight diffuse thickening of the gallbladder wall. Negative sonographic Murphy's sign. 9/10: GI, surgical service and IR consulted  9/11 cholecystostomy tube placed 9/21 IR capped percutaneous cholecystostomy drain   I have personally reviewed and interpreted all radiology studies and my findings are as above.  VENTILATOR  SETTINGS:    Cultures 9/4 BC >> negative 9/5 urine  >> positive E coli 9/11 blood culture>>>neg 9/11 perc drain negative 9/12 c diff antigen positive, toxigenic pcr positive 9/22 blood pending  9/22 urine pending     Antimicrobials:Anti-infectives    Start     Stop   12/27/16 1700  fluconazole (DIFLUCAN) IVPB 400 mg         12/18/16 0000  vancomycin (VANCOCIN) 50 mg/mL oral solution 500 mg     12/31/16 0904   12/17/16 1800  vancomycin (VANCOCIN) 50 mg/mL oral solution 500 mg  Status:  Discontinued     12/17/16 2333   12/16/16 1800  vancomycin (VANCOCIN) 50 mg/mL oral solution 125 mg  Status:  Discontinued     12/17/16 1509   12/16/16 1800  cefTAZidime (FORTAZ) 2 g in dextrose 5 % 50 mL IVPB  Status:  Discontinued     12/21/16 1323   12/14/16 1215  piperacillin-tazobactam (ZOSYN) IVPB 3.375 g  Status:  Discontinued     12/16/16 1740   12/12/16 1200  ampicillin (OMNIPEN) 1 g in sodium chloride 0.9 % 50 mL IVPB  Status:  Discontinued     12/14/16 1127   12/09/16 0100  ceFAZolin (ANCEF) IVPB 1 g/50 mL premix     12/09/16 1557   12/08/16 2300  cefTRIAXone (ROCEPHIN) 1 g in dextrose 5 % 50 mL IVPB  Status:  Discontinued     12/12/16 1002   12/04/16 0600  ceFAZolin (ANCEF) IVPB 2g/100 mL premix     12/05/16 0559       Devices    LINES / TUBES:  Left Radial Aline 9/4 >>9/12 R TL IJ CVC 9/4  >>  9/20 ETT   9/4 >> 9/6 Perc GB drain 9/12>>  capped on 9/21     Continuous Infusions: . sodium chloride Stopped (12/27/16 0300)  . sodium chloride    . feeding supplement (VITAL AF 1.2 CAL) 1,000 mL (12/27/16 1348)     Objective: Vitals:   12/27/16 0400 12/27/16 0700 12/27/16 1140 12/27/16 1141  BP: (!) 123/96 113/63  118/69  Pulse: 67 67  69  Resp: 17 (!) 22  18  Temp: (!) 97.5 F (36.4 C) (!) 97.4 F (36.3 C) (!) 96.8 F (36 C)   TempSrc: Oral Axillary Axillary   SpO2: 100% 99%  97%  Weight:      Height:        Intake/Output Summary (Last 24 hours) at  12/27/16 1448 Last data filed at 12/27/16 1200  Gross per 24 hour  Intake          3930.83 ml  Output             1800 ml  Net          2130.83 ml   Filed Weights   12/25/16 0323 12/26/16 0429 12/27/16 0341  Weight: 225 lb (102.1 kg) 220 lb 7.4 oz (100 kg) 224 lb 6.9 oz (101.8 kg)   Physical Exam:  General: Alert, will follow daughter's commands, speech still difficult to understand mumbles, No acute respiratory distress Eyes: negative scleral hemorrhage, negative anisocoria, positive icterus (but significantly improved) ENT: Positive oropharyngeal candidiasis, positive dry blood on tongue/gingival, positive tongue lesions from intubation healing Lungs: Clear to auscultation bilaterally without wheezes or crackles Cardiovascular: Regular rate and rhythm without murmur gallop or rub normal S1 and S2 Abdomen: Positive abdominal pain (RUQ: Significantly improved), positive mild distention improved from 9/22, positive soft, bowel sounds, no rebound, no ascites, no appreciable mass Extremities: No significant cyanosis, clubbing, anasarca Skin: Jaundice but improved Psychiatric:  Cannot accurately evaluate secondary to altered mental status. Central nervous system:  Cranial nerves II through XII intact, tongue/uvula midline, moved all extremities to command, positive dysarthria, negative expressive aphasia, negative receptive aphasia.      Data Reviewed: Care during the described time interval was provided by me .  I have reviewed this patient's available data, including medical history, events of note, physical examination, and all test results as part of my evaluation.   CBC:  Recent Labs Lab 12/21/16 0400 12/23/16 0338 12/24/16 0523 12/25/16 0156 12/26/16 1116  WBC 12.1* 10.2 10.4 10.2 12.7*  NEUTROABS  --   --  8.9* 9.1*  --   HGB 9.3* 8.7* 9.9* 9.7* 10.3*  HCT 29.1* 27.4* 31.0* 31.3* 33.1*  MCV 96.0 96.5 99.0 99.7 103.4*  PLT 309 354 343 329 325   Basic Metabolic  Panel:  Recent Labs Lab 12/21/16 0400  12/24/16 0523 12/24/16 1940 12/25/16 0156 12/26/16 0713 12/26/16 1433 12/27/16 1136  NA 141  < > 143  --  145 147* 149* 153*  K 4.0  < > 3.6 3.5 3.4* 4.3 4.0 4.5  CL 111  < > 112*  --  115* 119* 122* 123*  CO2 26  < > 22  --  22 23 22 25   GLUCOSE 173*  < > 258*  --  215* 180* 199* 194*  BUN 41*  < > 85*  --  77* 70* 67* 68*  CREATININE 0.73  < > 1.15  --  1.02 0.86 0.85 0.88  CALCIUM 7.0*  < > 7.6*  --  7.8* 7.9* 7.8* 8.1*  MG 1.8  --   --  2.2 2.2 2.2  --  2.3  PHOS 2.9  --   --   --   --   --   --   --   < > = values in this interval not displayed. GFR: Estimated Creatinine Clearance: 79.8 mL/min (by C-G formula based on SCr of 0.88 mg/dL). Liver Function Tests:  Recent Labs Lab 12/21/16 0400 12/23/16 0338 12/26/16 1433  AST 134* 123* 77*  ALT 108* 111* 120*  ALKPHOS 139* 102 127*  BILITOT 14.0* 10.4* 7.1*  PROT 4.9* 5.1* 5.1*  ALBUMIN 1.3* 1.4* 1.7*   No results for input(s): LIPASE, AMYLASE in the last 168 hours.  Recent Labs Lab 12/26/16 1433  AMMONIA 30   Coagulation Profile:  Recent Labs Lab 12/22/16 0357  INR 1.50   Cardiac Enzymes: No results for input(s): CKTOTAL, CKMB, CKMBINDEX, TROPONINI in the last 168 hours. BNP (last 3 results) No results for input(s): PROBNP in the last 8760 hours. HbA1C: No results for input(s): HGBA1C in the last 72 hours. CBG:  Recent Labs Lab 12/26/16 1929 12/26/16 2335 12/27/16 0339 12/27/16 0758 12/27/16 1156  GLUCAP 167* 152* 155* 145* 157*   Lipid Profile: No results for input(s): CHOL, HDL, LDLCALC, TRIG, CHOLHDL, LDLDIRECT in the last 72 hours. Thyroid Function Tests: No results for input(s): TSH, T4TOTAL, FREET4, T3FREE, THYROIDAB in the last 72 hours. Anemia Panel: No results for input(s): VITAMINB12, FOLATE, FERRITIN, TIBC, IRON, RETICCTPCT in the last 72 hours. Urine analysis:    Component Value Date/Time   COLORURINE YELLOW 12/08/2016 2015    APPEARANCEUR TURBID (A) 12/08/2016 2015   LABSPEC 1.012 12/08/2016 2015   PHURINE 5.0 12/08/2016 2015   GLUCOSEU NEGATIVE 12/08/2016 2015   HGBUR LARGE (A) 12/08/2016 2015   BILIRUBINUR NEGATIVE 12/08/2016 2015   KETONESUR NEGATIVE 12/08/2016 2015   PROTEINUR 100 (A) 12/08/2016 2015   UROBILINOGEN 1.0 09/20/2013 0851   NITRITE NEGATIVE 12/08/2016 2015   LEUKOCYTESUR MODERATE (A) 12/08/2016 2015   Sepsis Labs: (procalcitonin:4,lacticidven:4)  ) No results found for this or any previous visit (from the past 240 hour(s)).       Radiology Studies: No results found.      Scheduled Meds: . aspirin  81 mg Per Tube Daily  . chlorhexidine  15 mL Mouth Rinse BID  . Chlorhexidine Gluconate Cloth  6 each Topical Daily  . fludrocortisone  0.1 mg Per Tube Daily  . hydrocortisone sod succinate (SOLU-CORTEF) inj  50 mg Intravenous Q6H  . insulin aspart  0-20 Units Subcutaneous Q4H  . insulin glargine  25 Units Subcutaneous QHS  . lactulose  30 g Oral Daily  . mouth rinse  15 mL Mouth Rinse q12n4p  . midodrine  10 mg Oral TID WC  . tamsulosin  0.4 mg Oral Daily  . vancomycin  500 mg Per Tube Q6H   Continuous Infusions: . sodium chloride Stopped (12/27/16 0300)  . sodium chloride    . feeding supplement (VITAL AF 1.2 CAL) 1,000 mL (12/27/16 1348)     LOS: 24 days    Time spent: 40 minutes    WOODS, Roselind Messier, MD [facility] Pager [phone]   If 7PM-7AM, please contact night-coverage [email or web address] Password [facility] 12/27/2016, 2:48 PM

## 2016-12-27 NOTE — Progress Notes (Signed)
Referring Physician(s): Dr Valentina Lucks  Supervising Physician: Irish Lack  Patient Status:  [facility] - In-pt  Chief Complaint:  Hyperbilirubinemia  RUQ pain/Cholecystitis  Subjective:  Perc chole drain placed 9/11 Capped 9/21 TB 7.1 9/22 AMS; lying in bed---pulling at tubes/wires Daughter at bedside   Allergies: Amiodarone and Morphine and related  Medications: Prior to Admission medications   Medication Sig Start Date End Date Taking? Authorizing Provider  aspirin EC 81 MG tablet Take 81 mg by mouth daily with breakfast.    Yes [provider]  atorvastatin (LIPITOR) 20 MG tablet Take 1 tablet (20 mg total) by mouth daily. 04/07/16  Yes Lennette Bihari, MD  carvedilol (COREG) 3.125 MG tablet Take 3.125 mg by mouth 2 (two) times daily. 11/18/16  Yes [provider]  furosemide (LASIX) 20 MG tablet Take 1 tablet by mouth daily and ok to use extra tablet as needed for swelling. 10/14/16  Yes Cardama, Amadeo Garnet, MD  lisinopril (PRINIVIL,ZESTRIL) 2.5 MG tablet Take 1 tablet (2.5 mg total) by mouth daily. 09/21/16 12/20/16 Yes Lennette Bihari, MD  tamsulosin (FLOMAX) 0.4 MG CAPS capsule TAKE 1 CAPSULE(0.4 MG) BY MOUTH DAILY AFTER SUPPER 04/01/16  Yes Dunn, Tacey Ruiz, PA-C  ticagrelor (BRILINTA) 90 MG TABS tablet Take 1 tablet (90 mg total) by mouth 2 (two) times daily. 04/07/16  Yes Lennette Bihari, MD  carvedilol (COREG) 6.25 MG tablet Take 1 tablet (6.25 mg total) by mouth 2 (two) times daily. Patient not taking: Reported on 12/03/2016 04/28/16   Lennette Bihari, MD     Vital Signs: BP 113/63 (BP Location: Right Arm)   Pulse 67   Temp (!) 96.8 F (36 C) (Axillary)   Resp (!) 22   Ht 6\' 1"  (1.854 m)   Wt 224 lb 6.9 oz (101.8 kg)   SpO2 99%   BMI 29.61 kg/m   Physical Exam  Abdominal: Soft. Bowel sounds are normal.  Skin: Skin is warm and dry.  Site of capped chole drain is clean and dry NT No bleeding No drainage   Nursing note and vitals  reviewed.   Imaging: No results found.  Labs:  CBC:  Recent Labs  12/23/16 0338 12/24/16 0523 12/25/16 0156 12/26/16 1116  WBC 10.2 10.4 10.2 12.7*  HGB 8.7* 9.9* 9.7* 10.3*  HCT 27.4* 31.0* 31.3* 33.1*  PLT 354 343 329 325    COAGS:  Recent Labs  02/02/16 2100 12/08/16 2301 12/15/16 0948 12/17/16 0420 12/22/16 0357  INR 1.29 2.34 1.68 1.75 1.50  APTT 81*  --  33  --  37*    BMP:  Recent Labs  12/24/16 0523 12/24/16 1940 12/25/16 0156 12/26/16 0713 12/26/16 1433  NA 143  --  145 147* 149*  K 3.6 3.5 3.4* 4.3 4.0  CL 112*  --  115* 119* 122*  CO2 22  --  22 23 22   GLUCOSE 258*  --  215* 180* 199*  BUN 85*  --  77* 70* 67*  CALCIUM 7.6*  --  7.8* 7.9* 7.8*  CREATININE 1.15  --  1.02 0.86 0.85  GFRNONAA 57*  --  >60 >60 >60  GFRAA >60  --  >60 >60 >60    LIVER FUNCTION TESTS:  Recent Labs  12/20/16 0434 12/21/16 0400 12/23/16 0338 12/26/16 1433  BILITOT 17.2* 14.0* 10.4* 7.1*  AST 131* 134* 123* 77*  ALT 104* 108* 111* 120*  ALKPHOS 125 139* 102 127*  PROT 4.9* 4.9* 5.1*  5.1*  ALBUMIN 1.4* 1.3* 1.4* 1.7*    Assessment and Plan:  Chole drain capped 9/21 TB 7.1 today Will review with Rad for plan  Electronically Signed: Makeyla Govan A, PA-C 12/27/2016, 12:49 PM   I spent a total of 15 Minutes at the the patient's bedside AND on the patient's hospital floor or unit, greater than 50% of which was counseling/coordinating care for perc chole cap trial

## 2016-12-28 ENCOUNTER — Telehealth: Payer: Self-pay | Admitting: Cardiovascular Disease

## 2016-12-28 DIAGNOSIS — R740 Nonspecific elevation of levels of transaminase and lactic acid dehydrogenase [LDH]: Secondary | ICD-10-CM

## 2016-12-28 LAB — COMPREHENSIVE METABOLIC PANEL
ALBUMIN: 2.4 g/dL — AB (ref 3.5–5.0)
ALK PHOS: 121 U/L (ref 38–126)
ALT: 113 U/L — ABNORMAL HIGH (ref 17–63)
ANION GAP: 8 (ref 5–15)
AST: 89 U/L — ABNORMAL HIGH (ref 15–41)
BUN: 66 mg/dL — ABNORMAL HIGH (ref 6–20)
CALCIUM: 8 mg/dL — AB (ref 8.9–10.3)
CHLORIDE: 119 mmol/L — AB (ref 101–111)
CO2: 24 mmol/L (ref 22–32)
Creatinine, Ser: 0.85 mg/dL (ref 0.61–1.24)
GFR calc Af Amer: >60 mL/min (ref >60)
GFR calc non Af Amer: >60 mL/min (ref >60)
GLUCOSE: 198 mg/dL — AB (ref 65–99)
POTASSIUM: 4.2 mmol/L (ref 3.5–5.1)
SODIUM: 151 mmol/L — AB (ref 135–145)
Total Bilirubin: 6.6 mg/dL — ABNORMAL HIGH (ref 0.3–1.2)
Total Protein: 5.2 g/dL — ABNORMAL LOW (ref 6.5–8.1)

## 2016-12-28 LAB — GLUCOSE, CAPILLARY
GLUCOSE-CAPILLARY: 180 mg/dL — AB (ref 65–99)
GLUCOSE-CAPILLARY: 176 mg/dL — AB (ref 65–99)
GLUCOSE-CAPILLARY: 161 mg/dL — AB (ref 65–99)
GLUCOSE-CAPILLARY: 152 mg/dL — AB (ref 65–99)
Glucose-Capillary: 170 mg/dL — ABNORMAL HIGH (ref 65–99)
Glucose-Capillary: 180 mg/dL — ABNORMAL HIGH (ref 65–99)
Glucose-Capillary: 184 mg/dL — ABNORMAL HIGH (ref 65–99)

## 2016-12-28 LAB — MAGNESIUM: MAGNESIUM: 2.1 mg/dL (ref 1.7–2.4)

## 2016-12-28 MED ORDER — INSULIN GLARGINE 100 UNIT/ML ~~LOC~~ SOLN
10.0000 [IU] | Freq: Every day | SUBCUTANEOUS | Status: DC
  Administered 2016-12-28 – 2016-12-31 (×4): 10 [IU] via SUBCUTANEOUS
  Filled 2016-12-28 (×4): qty 0.1

## 2016-12-28 MED ORDER — ORAL CARE MOUTH RINSE: 15.0000 mL | Freq: Two times a day (BID) | OROMUCOSAL | Status: DC

## 2016-12-28 MED ORDER — CHLORHEXIDINE GLUCONATE 0.12 % MT SOLN
15.0000 mL | Freq: Two times a day (BID) | OROMUCOSAL | Status: DC
  Administered 2016-12-28 – 2016-12-30 (×4): 15 mL via OROMUCOSAL

## 2016-12-28 MED ORDER — MIDODRINE HCL 5 MG PO TABS
10.0000 mg | ORAL_TABLET | Freq: Three times a day (TID) | ORAL | Status: DC
  Administered 2016-12-28 – 2017-01-01 (×12): 10 mg
  Filled 2016-12-28 (×12): qty 2

## 2016-12-28 MED ORDER — FREE WATER
400.0000 mL | Freq: Four times a day (QID) | Status: DC
Start: 2016-12-28 — End: 2016-12-29
  Administered 2016-12-28 – 2016-12-29 (×3): 400 mL

## 2016-12-28 MED ORDER — ACETAMINOPHEN 160 MG/5ML PO SOLN: 650.0000 mg | Freq: Four times a day (QID) | ORAL | Status: DC | PRN

## 2016-12-28 NOTE — Progress Notes (Signed)
Physical Therapy Treatment Patient Details Name: Danny Oneill MRN: [id number] DOB: [birth year] Today's Date: 12/28/2016    History of Present Illness 83 yo male with PMH of HLD, lumbar spondylosis, MI, CAD, PAF, cardiomyopathy, CKD, CHF who was admitted on 8/30 with L hip fracture. He developed hypoxia and hypotension after surgery (L IM nail) on 9/4 and required re-intubation 9/4-9/6. Pt with septic shock from UTI, then found to have c diff, also with acute acaclulous colecystitis s/p perc drain 9/11.   PMH positive for MI, cardiomyopathy, CHF, first degree AV block, PAF, CKD III, bladder outlet obstruction, lumbar spondylosis, and TKA.    PT Comments    Patient progressing with mobility to OOB via lift this session.  Still limited by pain and weakness and limited communication.  Continues to be appropriate for SNF level rehab at d/c.  PT to follow acutely.    Follow Up Recommendations  SNF     Equipment Recommendations  Other (comment) (TBA at next venue)    Recommendations for Other Services       Precautions / Restrictions Precautions Precautions: Fall Precaution Comments: rectal tube, cortrack, c-diff Restrictions LLE Weight Bearing: Partial weight bearing LLE Partial Weight Bearing Percentage or Pounds: 50    Mobility  Bed Mobility Overal bed mobility: Needs Assistance Bed Mobility: Rolling Rolling: Mod assist;+2 for physical assistance;Max assist         General bed mobility comments: cues to reach for rail and pt able to assist to roll R, but not L  Transfers Overall transfer level: Needs assistance               General transfer comment: bed to chair via maximove lift  Ambulation/Gait                 Stairs            Wheelchair Mobility    Modified Rankin (Stroke Patients Only)       Balance                                            Cognition Arousal/Alertness: Awake/alert Behavior During Therapy:  Restless Overall Cognitive Status: Difficult to assess                                        Exercises General Exercises - Upper Extremity Shoulder Flexion: 5 reps;Both;Supine Elbow Extension: PROM;5 reps;Both;Supine General Exercises - Lower Extremity Ankle Circles/Pumps: PROM;Both;10 reps;Supine Heel Slides: PROM;AAROM;Both;5 reps;Supine    General Comments        Pertinent Vitals/Pain Pain Assessment: Faces Faces Pain Scale: Hurts little more Pain Location: grimacing with movement of L LE Pain Descriptors / Indicators: Grimacing;Guarding Pain Intervention(s): Monitored during session;Repositioned    Home Living                      Prior Function            PT Goals (current goals can now be found in the care plan section) Progress towards PT goals: Progressing toward goals    Frequency    Min 2X/week      PT Plan Current plan remains appropriate    Co-evaluation              AM-PAC PT "6  Clicks" Daily Activity  Outcome Measure  Difficulty turning over in bed (including adjusting bedclothes, sheets and blankets)?: Unable Difficulty moving from lying on back to sitting on the side of the bed? : Unable Difficulty sitting down on and standing up from a chair with arms (e.g., wheelchair, bedside commode, etc,.)?: Unable Help needed moving to and from a bed to chair (including a wheelchair)?: Total Help needed walking in hospital room?: Total Help needed climbing 3-5 steps with a railing? : Total 6 Click Score: 6    End of Session   Activity Tolerance: Patient tolerated treatment well Patient left: in chair;with family/visitor present;with call bell/phone within reach   PT Visit Diagnosis: Muscle weakness (generalized) (M62.81);Difficulty in walking, not elsewhere classified (R26.2);Other symptoms and signs involving the nervous system (R29.898);Pain Pain - Right/Left: Left Pain - part of body: Hip     Time:  8264-1583 PT Time Calculation (min) (ACUTE ONLY): 42 min  Charges:  $Therapeutic Exercise: 8-22 mins $Therapeutic Activity: 23-37 mins                    G CodesSheran Lawless, [place] [phone] 12/28/2016    Elray Mcgregor 12/28/2016, 1:27 PM

## 2016-12-28 NOTE — Progress Notes (Signed)
[place] TEAM 1 - Stepdown/ICU TEAM  Danny Oneill  ZOX:[id number] DOB: November 20, 1931 DOA: 12/03/2016 PCP: Geoffry Paradise, MD    Brief Narrative:  83yo M w/ a hx of asbestosis, CAD, Chronic CHF, First Degree AV block, Paroxysmal Atrial Fibrillation, HLD, Bladder Outlet Obstruction, and CKD stage III who presented with hip pain after a chair slid from underneath him causing him to fall to the floor.    In the ED an X-ray confirmed a hip fracture.  Significant Events: 8/30 Admit 9/4 intubated - S/P LEFT intertrochantric intramedullary nailing - tx to ICU for hypotension 9/6 extubated 9/8 [place] - negative gallstones, slight sludge in the gallbladder and there is slight diffuse thickening of the gallbladder wall. Negative sonographic Murphy's sign. 9/10 GI, Surgery, and IR consulted  9/11 cholecystostomy tube placed 9/21 IR capped percutaneous cholecystostomy drain   Subjective: The patient remains delirious.  His daughter at the bedside and feels that he is actually somewhat more confused today.  He continues to have loose watery diarrhea.  He cannot provide a reliable history due to his mental status.  He does not appear to be in acute respiratory distress or uncontrolled pain.  Assessment & Plan:  LEFT hip fracture S/P LEFT intertrochanteric intramedullary nailing 9/4  Septic shock due to E coli UTI Shock resolved - f/u urine and blood cx w/o growth s/p abx course - now off broad spectrum abx   C diff colitis Complete 2 week course PO Vancomycin    Relative Adrenal insufficiency / Hypotension Slowly wean stress dose steroids  Acute Acalculous Colecystitis  Abdominal US noted gallbladder sludge/diffuse thickening - + Murphy sign on exam - 9/11 S/P percutaneous cholecystostomy drain > capped 9/21 w/ IR directing care of this issue    Elevated LFTs and ammonia level LFTs w/o signif change, but Tbil declining - stop lactulose dose   Acute respiratory failure with hypoxia /  Chronic Asbestosis Resolved w/ pt now on RA   Chronic Systolic and Diastolic CHF TTE 2/8 EF 45-50% w/ grade 1 diastolic dysfunction - developing low grade anasarca - avoid diuresis for now in setting of hypernatremia   CAD S/P DES 02/2016  Paroxysmal atrial fibrillation Currently in NSR  Acute renal failure  Resolved w/ crt now normal   Dysphagia 9/21 failed swallow study continue tube feeds - 9/24 repeat swallow evaluation to determine if PEG will be required   Moderate protein calorie malnutrition Continue tube feeds  Acute metabolic encephalopathy. Multifactorial septic shock, elevated ammonia, metabolic encephalopathy, hypernatremia, mild uremia - has not improved - search record to assure all metabolic issues have been assessed - minimize sedatives   Hypernatremia Cont free water and follow trend - improving   BPH Flomax 0.4 mg daily  Oral candidiasis Cont Diflucan   Goals of Care  Daughter notes that family/pt may not desire a PEG tube or ongoing support if it is not felt that the pt's quality of life will improved beyond his present state - if we do not see a significant change in his delirium over the next 24-48hrs I will ask Palliative Care to see him    DVT prophylaxis: SCDs Code Status: DNR - NO CODE Family Communication: spoke to daughter at bedside  Disposition Plan: SDU  Consultants:  IR PCCM Orthopedics GI  Antimicrobials:  Oral Vanc 9/12 >  Objective: Blood pressure 114/64, pulse 68, temperature (!) 97.1 F (36.2 C), temperature source Axillary, resp. rate 15, height <BADDemorris Choyce4 m), weight 102.2 kg (225 lb  5 oz), SpO2 99 %.  Intake/Output Summary (Last 24 hours) at 12/28/16 1336 Last data filed at 12/28/16 0900  Gross per 24 hour  Intake          3285.83 ml  Output             4125 ml  Net          -839.17 ml   Filed Weights   12/26/16 0429 12/27/16 0341 12/28/16 0315  Weight: 100 kg (220 lb 7.4 oz) 101.8 kg (224 lb 6.9 oz) 102.2  kg (225 lb 5 oz)    Examination: General: No acute respiratory distress - delirious  Lungs: Clear to auscultation bilaterally  Cardiovascular: Regular rate without murmur gallop or rub normal S1 and S2 Abdomen: Nontender, nondistended, soft, bowel sounds positive, no rebound Extremities: trace B LE edema   CBC:  Recent Labs Lab 12/23/16 0338 12/24/16 0523 12/25/16 0156 12/26/16 1116  WBC 10.2 10.4 10.2 12.7*  NEUTROABS  --  8.9* 9.1*  --   HGB 8.7* 9.9* 9.7* 10.3*  HCT 27.4* 31.0* 31.3* 33.1*  MCV 96.5 99.0 99.7 103.4*  PLT 354 343 329 325   Basic Metabolic Panel:  Recent Labs Lab 12/24/16 1940 12/25/16 0156 12/26/16 0713 12/26/16 1433 12/27/16 1136 12/28/16 0217  NA  --  145 147* 149* 153* 151*  K 3.5 3.4* 4.3 4.0 4.5 4.2  CL  --  115* 119* 122* 123* 119*  CO2  --  GLUCOSE  --  215* 180* 199* 194* 198*  BUN  --  77* 70* 67* 68* 66*  CREATININE  --  1.02 0.86 0.85 0.88 0.85  CALCIUM  --  7.8* 7.9* 7.8* 8.1* 8.0*  MG 2.2 2.2 2.2  --  2.3 2.1   GFR: Estimated Creatinine Clearance: 82.7 mL/min (by C-G formula based on SCr of 0.85 mg/dL).  Liver Function Tests:  Recent Labs Lab 12/23/16 0338 12/26/16 1433 12/28/16 0217  AST 123* 77* 89*  ALT 111* 120* 113*  ALKPHOS 102 127* 121  BILITOT 10.4* 7.1* 6.6*  PROT 5.1* 5.1* 5.2*  ALBUMIN 1.4* 1.7* 2.4*    Recent Labs Lab 12/26/16 1433  AMMONIA 30    Coagulation Profile:  Recent Labs Lab 12/22/16 0357  INR 1.50    HbA1C: Hgb A1c MFr Bld  Date/Time Value Ref Range Status  12/13/2016 03:35 AM 5.8 (H) 4.8 - 5.6 % Final    Comment:    (NOTE)         Prediabetes: 5.7 - 6.4         Diabetes: >6.4         Glycemic control for adults with diabetes: <7.0   02/02/2016 11:18 PM 5.9 (H) 4.8 - 5.6 % Final    Comment:    (NOTE)         Pre-diabetes: 5.7 - 6.4         Diabetes: >6.4         Glycemic control for adults with diabetes: <7.0     CBG:  Recent Labs Lab 12/27/16 1918  12/27/16 2320 12/28/16 0313 12/28/16 0844 12/28/16 1306  GLUCAP 182* 182* 180* 180* 184*    Recent Results (from the past 240 hour(s))  Culture, blood (routine x 2)     Status: None (Preliminary result)   Collection Time: 12/26/16  2:30 PM  Result Value Ref Range Status   Specimen Description BLOOD RIGHT ARM  Final   Special Requests IN  PEDIATRIC BOTTLE Blood Culture adequate volume  Final   Culture NO GROWTH < 24 HOURS  Final   Report Status PENDING  Incomplete  Culture, blood (routine x 2)     Status: None (Preliminary result)   Collection Time: 12/26/16  2:38 PM  Result Value Ref Range Status   Specimen Description BLOOD LEFT HAND  Final   Special Requests   Final    BOTTLES DRAWN AEROBIC ONLY Blood Culture adequate volume   Culture NO GROWTH < 24 HOURS  Final   Report Status PENDING  Incomplete  Culture, Urine     Status: None   Collection Time: 12/26/16  6:19 PM  Result Value Ref Range Status   Specimen Description URINE, RANDOM  Final   Special Requests NONE  Final   Culture NO GROWTH  Final   Report Status 12/27/2016 FINAL  Final     Scheduled Meds: . aspirin  81 mg Per Tube Daily  . chlorhexidine  15 mL Mouth Rinse BID  . chlorhexidine  15 mL Mouth Rinse BID  . Chlorhexidine Gluconate Cloth  6 each Topical Daily  . fludrocortisone  0.1 mg Per Tube Daily  . free water  300 mL Per Tube Q6H  . hydrocortisone sod succinate (SOLU-CORTEF) inj  50 mg Intravenous Q6H  . insulin aspart  0-20 Units Subcutaneous Q4H  . insulin glargine  25 Units Subcutaneous QHS  . lactulose  30 g Oral Daily  . magic mouthwash  5 mL Oral TID  . mouth rinse  15 mL Mouth Rinse q12n4p  . mouth rinse  15 mL Mouth Rinse q12n4p  . midodrine  10 mg Oral TID WC  . tamsulosin  0.4 mg Oral Daily  . vancomycin  500 mg Per Tube Q6H     LOS: 25 days   Lonia Blood, MD [facility] Office  [phone] Pager - Text Page per Amion as per below:  On-Call/Text Page:       [email or web address]      password [email or web address]  If 7PM-7AM, please contact night-coverage [email or web address] Password [email or web address] 12/28/2016, 1:36 PM

## 2016-12-28 NOTE — Discharge Instructions (Signed)
Flush drain with 5cc of normal saline daily °

## 2016-12-28 NOTE — Telephone Encounter (Signed)
New message    Pt daughter is calling asking for a call back.

## 2016-12-28 NOTE — Telephone Encounter (Signed)
Spoke w daughter of patient. He's currently admitted to [name] following hip surgery. Had prolonged stay due to complications including bleeding and UTI. He has just gone to a stepdown unit following ICU admission.  Daughter verbalized her concerns that patient is having some fluid retention now, she's concerned as she does not want him to go into an exacerbation of heart failure. Requesting cardiology see patient. Informed her request would need to be made to hospital staff, but that I will gladly pass along to Dr. Tresa Endo for his awareness. Daughter was understanding of protocols and will discuss her concerns w patient's RN.

## 2016-12-28 NOTE — Progress Notes (Signed)
Patient ID: Danny Oneill, male   DOB: 09-11-1931, 83 y.o.   MRN: [id number]    Referring Physician(s): Dr. Chilton Greathouse  Supervising Physician: Gilmer Mor  Patient Status: [facility] - In-pt  Chief Complaint: cholestasis  Subjective: Patient still confused.    Allergies: Amiodarone and Morphine and related  Medications: Prior to Admission medications   Medication Sig Start Date End Date Taking? Authorizing Provider  aspirin EC 81 MG tablet Take 81 mg by mouth daily with breakfast.    Yes [provider]  atorvastatin (LIPITOR) 20 MG tablet Take 1 tablet (20 mg total) by mouth daily. 04/07/16  Yes Lennette Bihari, MD  carvedilol (COREG) 3.125 MG tablet Take 3.125 mg by mouth 2 (two) times daily. 11/18/16  Yes [provider]  furosemide (LASIX) 20 MG tablet Take 1 tablet by mouth daily and ok to use extra tablet as needed for swelling. 10/14/16  Yes Cardama, Amadeo Garnet, MD  lisinopril (PRINIVIL,ZESTRIL) 2.5 MG tablet Take 1 tablet (2.5 mg total) by mouth daily. 09/21/16 12/20/16 Yes Lennette Bihari, MD  tamsulosin (FLOMAX) 0.4 MG CAPS capsule TAKE 1 CAPSULE(0.4 MG) BY MOUTH DAILY AFTER SUPPER 04/01/16  Yes Dunn, Tacey Ruiz, PA-C  ticagrelor (BRILINTA) 90 MG TABS tablet Take 1 tablet (90 mg total) by mouth 2 (two) times daily. 04/07/16  Yes Lennette Bihari, MD  carvedilol (COREG) 6.25 MG tablet Take 1 tablet (6.25 mg total) by mouth 2 (two) times daily. Patient not taking: Reported on 12/03/2016 04/28/16   Lennette Bihari, MD    Vital Signs: BP 114/64   Pulse 68   Temp (!) 97.1 F (36.2 C) (Axillary)   Resp 15   Ht 6\' 1"  (1.854 m)   Wt 225 lb 5 oz (102.2 kg)   SpO2 99%   BMI 29.73 kg/m   Physical Exam: Abd: soft, no pain around his drain.  No leaking from around drain with drain capped  Imaging: No results found.  Labs:  CBC:  Recent Labs  12/23/16 0338 12/24/16 0523 12/25/16 0156 12/26/16 1116  WBC 10.2 10.4 10.2 12.7*  HGB 8.7* 9.9* 9.7* 10.3*  HCT  27.4* 31.0* 31.3* 33.1*  PLT 354 343 329 325    COAGS:  Recent Labs  02/02/16 2100 12/08/16 2301 12/15/16 0948 12/17/16 0420 12/22/16 0357  INR 1.29 2.34 1.68 1.75 1.50  APTT 81*  --  33  --  37*    BMP:  Recent Labs  12/26/16 0713 12/26/16 1433 12/27/16 1136 12/28/16 0217  NA 147* 149* 153* 151*  K 4.3 4.0 4.5 4.2  CL 119* 122* 123* 119*  CO2 23 22 25 24   GLUCOSE 180* 199* 194* 198*  BUN 70* 67* 68* 66*  CALCIUM 7.9* 7.8* 8.1* 8.0*  CREATININE 0.86 0.85 0.88 0.85  GFRNONAA >60 >60 >60 >60  GFRAA >60 >60 >60 >60    LIVER FUNCTION TESTS:  Recent Labs  12/21/16 0400 12/23/16 0338 12/26/16 1433 12/28/16 0217  BILITOT 14.0* 10.4* 7.1* 6.6*  AST 134* 123* 77* 89*  ALT 108* 111* 120* 113*  ALKPHOS 139* 102 127* 121  PROT 4.9* 5.1* 5.1* 5.2*  ALBUMIN 1.3* 1.4* 1.7* 2.4*    Assessment and Plan: 1. Hyperbilirubinemia, s/p perc chole drain placement  Patient has tolerated his capping well with no increase in pain or leakage from around the drain itself.  Continue with the drain capped.  We will have him return to clinic after this has been in placed for 5-6 weeks  and determine if it can be removed at that time.  Please call us in the interim with further questions.  Electronically Signed: Letha Cape 12/28/2016, 12:19 PM   I spent a total of 15 Minutes at the the patient's bedside AND on the patient's hospital floor or unit, greater than 50% of which was counseling/coordinating care for hyperbilirubinemia

## 2016-12-28 NOTE — Progress Notes (Signed)
   Subjective:  Not responsive verbally, but opens eyes and follows my voice on questioning.  Per nursing he is doing better somewhat.  Objective:   VITALS:   Vitals:   12/28/16 0800 12/28/16 1200 12/28/16 1700 12/28/16 1943  BP: 114/64 116/74 120/73 128/76  Pulse: 68 67  66  Resp: 15 13 14 16   Temp: 98.1 F (36.7 C) 98.2 F (36.8 C) (!) 97.2 F (36.2 C)   TempSrc: Axillary Axillary Axillary Axillary  SpO2:   100% 100%  Weight:      Height:       General: Jaundice improved  Left leg exam:  Incision about the hip on the lateral aspect are clean dry and intact. There is no drainage at this time.Bandages removed . Thigh is soft and nontender. Skin is warm and well-perfused. He is unable to provide a focal examination but does spontaneously withdrawal to pain throughout the left leg. As a 1+ dorsalis pedis pulse. His calf is soft.  He does  respond to passive stretch and a painful manner.  He has pitting edema throughout the lower leg.  This is symmetric with the contralateral leg  Lab Results  Component Value Date   WBC 12.7 (H) 12/26/2016   HGB 10.3 (L) 12/26/2016   HCT 33.1 (L) 12/26/2016   MCV 103.4 (H) 12/26/2016   PLT 325 12/26/2016   BMET    Component Value Date/Time   NA 151 (H) 12/28/2016 0217   NA 132 (A) 02/17/2016   K 4.2 12/28/2016 0217   CL 119 (H) 12/28/2016 0217   CO2 24 12/28/2016 0217   GLUCOSE 198 (H) 12/28/2016 0217   BUN 66 (H) 12/28/2016 0217   BUN 37 (A) 02/17/2016   CREATININE 0.85 12/28/2016 0217   CREATININE 1.25 (H) 03/16/2016 1229   CALCIUM 8.0 (L) 12/28/2016 0217   GFRNONAA >60 12/28/2016 0217   GFRAA >60 12/28/2016 0217     Assessment/Plan: 20 Days Post-Op   Principal Problem:   Closed left hip fracture (HCC) Active Problems:   Hyperlipidemia   CAD (coronary artery disease)   CKD (chronic kidney disease), stage III   Hip fracture (HCC)   Compromised airway   Hypovolemic shock (HCC)   Elevated troponin I level   Chronic  combined systolic and diastolic heart failure (HCC)   Acute respiratory failure (HCC)   Elevated LFTs   -overall looking some better   - Aspirin and SCDs for DVT prophylaxis.  - He is appropriate for 50% weightbearing to the left lower extremity   - left hip xrays ordered for tomorrow  -dry dressing changes prn  Yolonda Kida 12/28/2016, 10:12 PM   Maryan Rued, MD [phone]

## 2016-12-28 NOTE — Procedures (Signed)
Objective Swallowing Evaluation: Type of Study: FEES-Fiberoptic Endoscopic Evaluation of Swallow  Patient Details  Name: Danny Oneill MRN: [id number] Date of Birth: 19-Feb-1932  Today's Date: 12/28/2016 Time: SLP Start Time (ACUTE ONLY): 1052-SLP Stop Time (ACUTE ONLY): 1125 SLP Time Calculation (min) (ACUTE ONLY): 33 min  Past Medical History:  Past Medical History:  Diagnosis Date  . Anemia   . Aortic insufficiency 03/09/2016   a. mild-mod AI by echo 03/04/16.  . Arthritis   . Asbestosis (HCC)    "no breathing problems" pt states worked in [employer] for many yrs and was exposed to asbestosis  . Bladder outlet obstruction 03/09/2016  . CAD (coronary artery disease)    a. 02/2016: inf lat STEMI with occluded OM2 which was treated with DES; otherwise he had moderate 50% oRCA, 40% oLM, 70% D1, 10% mLAD, 80% lateral 2nd marg which was treated medically.  . Cardiomyopathy, ischemic   . Chronic combined systolic and diastolic CHF (congestive heart failure) (HCC)    a. EF 30-35% at time of STEMI 02/2016, improved to 40-45% by echo 11/20 (but with pericardial effusion). b. Further improved EF to 55-60% 03/04/16.  Marland Kitchen CKD (chronic kidney disease), stage III   . Coronary artery disease   . Dyspnea   . First degree AV block   . Hip fracture (HCC) 11/2016  . Hyperlipidemia   . Hyponatremia   . Lumbar spondylosis   . Myocardial infarction (HCC)   . Orthostatic hypotension   . PAF (paroxysmal atrial fibrillation) (HCC)    a. dx 02/2016: not anticoagulated due to pericardial effusion; amiodarone stopped due to suspected allergic drug rash.  . Pericardial effusion    a. 02/2016 - followed clinically (mod-large at first then small-mod in f/u echo).   Past Surgical History:  Past Surgical History:  Procedure Laterality Date  . CARDIAC CATHETERIZATION N/A 02/02/2016   Procedure: Left Heart Cath and Coronary Angiography;  Surgeon: Corky Crafts, MD;  Location: [facility] INVASIVE CV LAB;  Service:  Cardiovascular;  Laterality: N/A;  . CARDIAC CATHETERIZATION N/A 02/02/2016   Procedure: Coronary Stent Intervention;  Surgeon: Corky Crafts, MD;  Location: [facility] INVASIVE CV LAB;  Service: Cardiovascular;  Laterality: N/A;  . CARDIAC CATHETERIZATION N/A 02/02/2016   Procedure: IABP Insertion;  Surgeon: Corky Crafts, MD;  Location: [facility] INVASIVE CV LAB;  Service: Cardiovascular;  Laterality: N/A;  . CATARACTS REMOVED     BIL  . CORONARY STENT PLACEMENT  01/2016  . INTRAMEDULLARY (IM) NAIL INTERTROCHANTERIC Left 12/08/2016   Procedure: INTRAMEDULLARY (IM) NAIL INTERTROCHANTRIC;  Surgeon: Yolonda Kida, MD;  Location: [facility] OR;  Service: Orthopedics;  Laterality: Left;  . IR PERC CHOLECYSTOSTOMY  12/15/2016  . ROTATOR CUFF REPAIR  2011   L SHOULDER  . TOTAL KNEE ARTHROPLASTY Right 10/03/2013   Procedure: RIGHT TOTAL KNEE ARTHROPLASTY;  Surgeon: [name], MD;  Location: [facility] ORS;  Service: Orthopedics;  Laterality: Right;   HPI: 83 yo male with PMH of HLD, lumbar spondylosis, MI, CAD, PAF, cardiomyopathy, CKD, CHF who was admitted on 8/30 with L hip fracture. He developed hypoxia and hypotension after surgery (L IM nail) on 9/4 and required re-intubation 9/4-9/6. Pt with septic shock from UTI, then found to have c diff, also with acute acaclulous colecystitis s/p perc drain 9/11. SLP ordered for swallow evaluation as pt's alertness began to improve.  Subjective: pt communicative but difficult to understand, does clearly ask for "water"   Assessment / Plan / Recommendation  CHL IP CLINICAL IMPRESSIONS  12/28/2016  Clinical Impression Pt exhibits severe oral and pharyngeal dysphagia marked by severe/copious diffuse oral secretions, severe xerostomia leading to poor and delayed ability to manipulate and transit ice chips. Dried secretions visible throughout pharynx/larynx inclusing tongue base; large mucous bolus supraglotticaly on pharyngeal wall. Epiglottis obstructed view during  vocalization in attemtps to view vocal cord movement. Teaspoon thin liquid fell to and below vocal cords without pt's sensation with retention in pyriform sinuses. Pt not safe for po's for nutrition however recommend continued intense oral care and ice chips for moisture/comfort. Prognosis for swallow guarded as he is extremely weak at present (uncertain of his overall prognosis- Palliative care consult?).      SLP Visit Diagnosis Dysphagia, oropharyngeal phase (R13.12)  Attention and concentration deficit following --  Frontal lobe and executive function deficit following --  Impact on safety and function Severe aspiration risk      CHL IP TREATMENT RECOMMENDATION 12/28/2016  Treatment Recommendations Therapy as outlined in treatment plan below     Prognosis 12/28/2016  Prognosis for Safe Diet Advancement Guarded  Barriers to Reach Goals --  Barriers/Prognosis Comment --    CHL IP DIET RECOMMENDATION 12/28/2016  SLP Diet Recommendations Ice chips PRN after oral care  Liquid Administration via --  Medication Administration Via alternative means  Compensations --  Postural Changes --      CHL IP OTHER RECOMMENDATIONS 12/28/2016  Recommended Consults --  Oral Care Recommendations Oral care QID  Other Recommendations Have oral suction available      CHL IP FOLLOW UP RECOMMENDATIONS 12/28/2016  Follow up Recommendations None      CHL IP FREQUENCY AND DURATION 12/28/2016  Speech Therapy Frequency (ACUTE ONLY) min 1 x/week  Treatment Duration 1 week           CHL IP ORAL PHASE 12/28/2016  Oral Phase Impaired  Oral - Pudding Teaspoon --  Oral - Pudding Cup --  Oral - Honey Teaspoon --  Oral - Honey Cup --  Oral - Nectar Teaspoon --  Oral - Nectar Cup --  Oral - Nectar Straw --  Oral - Thin Teaspoon Left anterior bolus loss;Right anterior bolus loss;Decreased bolus cohesion;Premature spillage;Reduced posterior propulsion  Oral - Thin Cup --  Oral - Thin Straw --  Oral - Puree --   Oral - Mech Soft --  Oral - Regular --  Oral - Multi-Consistency --  Oral - Pill --  Oral Phase - Comment --    CHL IP PHARYNGEAL PHASE 12/28/2016  Pharyngeal Phase Impaired  Pharyngeal- Pudding Teaspoon --  Pharyngeal --  Pharyngeal- Pudding Cup --  Pharyngeal --  Pharyngeal- Honey Teaspoon --  Pharyngeal --  Pharyngeal- Honey Cup --  Pharyngeal --  Pharyngeal- Nectar Teaspoon --  Pharyngeal --  Pharyngeal- Nectar Cup --  Pharyngeal --  Pharyngeal- Nectar Straw --  Pharyngeal --  Pharyngeal- Thin Teaspoon Pharyngeal residue - pyriform;Penetration/Aspiration before swallow;Reduced airway/laryngeal closure;Reduced anterior laryngeal mobility;Reduced pharyngeal peristalsis;Reduced epiglottic inversion;Reduced laryngeal elevation;Reduced tongue base retraction  Pharyngeal Material enters airway, passes BELOW cords without attempt by patient to eject out (silent aspiration)  Pharyngeal- Thin Cup --  Pharyngeal --  Pharyngeal- Thin Straw --  Pharyngeal --  Pharyngeal- Puree --  Pharyngeal --  Pharyngeal- Mechanical Soft --  Pharyngeal --  Pharyngeal- Regular --  Pharyngeal --  Pharyngeal- Multi-consistency --  Pharyngeal --  Pharyngeal- Pill --  Pharyngeal --  Pharyngeal Comment --     No flowsheet data found.  No flowsheet data found.  Royce Macadamia 12/28/2016, 1:11 PM   Breck Coons Lonell Face.Ed [employer] [phone]

## 2016-12-29 ENCOUNTER — Inpatient Hospital Stay (HOSPITAL_COMMUNITY): Payer: PPO

## 2016-12-29 DIAGNOSIS — Z7189 Other specified counseling: Secondary | ICD-10-CM

## 2016-12-29 DIAGNOSIS — Z515 Encounter for palliative care: Secondary | ICD-10-CM

## 2016-12-29 LAB — COMPREHENSIVE METABOLIC PANEL
ALT: 126 U/L — ABNORMAL HIGH (ref 17–63)
ANION GAP: 7 (ref 5–15)
AST: 80 U/L — ABNORMAL HIGH (ref 15–41)
Albumin: 2.3 g/dL — ABNORMAL LOW (ref 3.5–5.0)
Alkaline Phosphatase: 137 U/L — ABNORMAL HIGH (ref 38–126)
BILIRUBIN TOTAL: 6.2 mg/dL — AB (ref 0.3–1.2)
BUN: 67 mg/dL — ABNORMAL HIGH (ref 6–20)
CALCIUM: 7.9 mg/dL — AB (ref 8.9–10.3)
CHLORIDE: 119 mmol/L — AB (ref 101–111)
CO2: 24 mmol/L (ref 22–32)
Creatinine, Ser: 0.79 mg/dL (ref 0.61–1.24)
Glucose, Bld: 153 mg/dL — ABNORMAL HIGH (ref 65–99)
POTASSIUM: 3.3 mmol/L — AB (ref 3.5–5.1)
Sodium: 150 mmol/L — ABNORMAL HIGH (ref 135–145)
Total Protein: 5.1 g/dL — ABNORMAL LOW (ref 6.5–8.1)

## 2016-12-29 LAB — VITAMIN B12: VITAMIN B 12: 3868 pg/mL — AB (ref 180–914)

## 2016-12-29 LAB — GLUCOSE, CAPILLARY
GLUCOSE-CAPILLARY: 121 mg/dL — AB (ref 65–99)
GLUCOSE-CAPILLARY: 146 mg/dL — AB (ref 65–99)
Glucose-Capillary: 149 mg/dL — ABNORMAL HIGH (ref 65–99)
Glucose-Capillary: 139 mg/dL — ABNORMAL HIGH (ref 65–99)

## 2016-12-29 LAB — CBC
HCT: 35.3 % — ABNORMAL LOW (ref 39.0–52.0)
HEMOGLOBIN: 10.7 g/dL — AB (ref 13.0–17.0)
MCH: 32.3 pg (ref 26.0–34.0)
MCHC: 30.3 g/dL (ref 30.0–36.0)
MCV: 106.6 fL — AB (ref 78.0–100.0)
PLATELETS: 208 10*3/uL (ref 150–400)
RBC: 3.31 MIL/uL — AB (ref 4.22–5.81)
RDW: 28.9 % — ABNORMAL HIGH (ref 11.5–15.5)
WBC: 11.6 10*3/uL — AB (ref 4.0–10.5)

## 2016-12-29 LAB — RETICULOCYTES
RBC.: 3.31 MIL/uL — ABNORMAL LOW (ref 4.22–5.81)
RETIC COUNT ABSOLUTE: 228.4 10*3/uL — AB (ref 19.0–186.0)
RETIC CT PCT: 6.9 % — AB (ref 0.4–3.1)

## 2016-12-29 LAB — IRON AND TIBC
IRON: 73 ug/dL (ref 45–182)
Saturation Ratios: 38 % (ref 17.9–39.5)
TIBC: 195 ug/dL — AB (ref 250–450)
UIBC: 122 ug/dL

## 2016-12-29 LAB — FERRITIN: FERRITIN: 472 ng/mL — AB (ref 24–336)

## 2016-12-29 LAB — TSH: TSH: 1.765 u[IU]/mL (ref 0.350–4.500)

## 2016-12-29 LAB — FOLATE: Folate: 23.5 ng/mL (ref >5.9)

## 2016-12-29 MED ORDER — FREE WATER
400.0000 mL | Freq: Every day | Status: DC
  Administered 2016-12-29 – 2017-01-01 (×14): 400 mL

## 2016-12-29 MED ORDER — OLANZAPINE 5 MG PO TABS
5.0000 mg | ORAL_TABLET | Freq: Every day | ORAL | Status: DC
  Administered 2016-12-29 – 2017-01-04 (×7): 5 mg
  Filled 2016-12-29 (×7): qty 1

## 2016-12-29 MED ORDER — POTASSIUM CHLORIDE 20 MEQ/15ML (10%) PO SOLN
60.0000 meq | Freq: Once | ORAL | Status: AC
  Administered 2016-12-29: 60 meq via ORAL
  Filled 2016-12-29: qty 45

## 2016-12-29 MED ORDER — DEXTROSE 5 % IV SOLN
INTRAVENOUS | Status: DC
  Administered 2016-12-29 – 2017-01-03 (×7): via INTRAVENOUS

## 2016-12-29 NOTE — Telephone Encounter (Signed)
Called and spoke to [place], pt's son. Informed him that Danny Oneill is busy rounding in hospital and Danny Oneill may not be able to call this week due to schedules at the hospital. I tried to answer any question Danny Oneill had for [facility] but he still requested to speak with Danny Oneill and did not give me any information.   Best contact number for Danny Oneill is [phone]. Danny Oneill requested for Danny Oneill to call when he has a chance, this is not urgent.

## 2016-12-29 NOTE — Progress Notes (Signed)
Nutrition Follow-up  DOCUMENTATION CODES:   Non-severe (moderate) malnutrition in context of chronic illness  INTERVENTION:   -Continue Vital AF 1.2 @ 70 ml/hr via cortrak tube  Tube feeding regimen provides 2016 kcal (100% of needs), 126 grams of protein, and 1419 ml of H2O (3419 ml with inclusion of free water flush regimen).   NUTRITION DIAGNOSIS:   Malnutrition (moderate) related to chronic illness (CAD, CKD) as evidenced by mild depletion of body fat, mild depletion of muscle mass, percent weight loss (9% weight loss in 3 months).  Ongoing  GOAL:   Patient will meet greater than or equal to 90% of their needs  Met with TF  MONITOR:   Vent status, Labs, TF tolerance, Skin, I & O's  REASON FOR ASSESSMENT:   Consult Enteral/tube feeding initiation and management  ASSESSMENT:   83 yo male with PMH of HLD, lumbar spondylosis, MI, CAD, PAF, cardiomyopathy, CKD, CHF who was admitted on 8/30 with L hip fracture. He developed hypoxia and hypotension after surgery (L IM nail) on 9/4 and required re-intubation and transfer to the ICU.  9/11- s/p perc cholecystostomy drain via IR 9/18- off pressors 9/19- transferred from ICU to SDU 9/20- s/p BSE, recommending continued NPO with nutrition via alternative means 9/21- s/p BSE, recommending continued NPO with nutrition via alternative means, perc chole tube capped 9/24- s/p FEES- remains at high aspiration risk, recommending continued NPO with nutrition via alternative means 9/25- free water flushes of 400 ml 5 times daily added by MD  Pt remains with rectal tube; noted 650 ml output within the past 24 hours.   Pt remains on TF; Vital AF 1.2 infusing via cortrak tube at goal rate of 70 ml/hr. Regimen providing 2016 kcals, 126 grams protein, and 1419 ml free water meeting 100% of estimated nutritional needs. Of note, pt has had cortrak tube for 17 days.   Palliative care has scheduled family meeting for today.  Labs reviewed:  CBGS: 121-149 (inpatient orders for glycemic control 0-20 units insulin aspart every 4 hours, 10 units insulin glargine daily).    Diet Order:  Diet NPO time specified  Skin:   (closed lt thigh incision)  Last BM:  12/28/16 (650 ml output via rectal tube)  Height:   Ht Readings from Last 1 Encounters:  12/23/16 _0  (1.854 m)    Weight:   Wt Readings from Last 1 Encounters:  12/29/16 224 lb 3.3 oz (101.7 kg)    Ideal Body Weight:  83.6 kg  BMI:  Body mass index is 29.58 kg/m.  Estimated Nutritional Needs:   Kcal:  1900-2250 kcals  Protein:  115-136 g   Fluid:  2 L  EDUCATION NEEDS:   No education needs identified at this time  Leon Montoya A. Jimmye Norman, RD, LDN, CDE Pager: [phone] After hours Pager: [phone]

## 2016-12-29 NOTE — Telephone Encounter (Signed)
agree

## 2016-12-29 NOTE — Consult Note (Signed)
Consultation Note Date: 12/29/2016   Patient Name: Danny Oneill  DOB: 01/08/1932  MRN: [id number]  Age / Sex: 83 y.o., male  PCP: Burnard Bunting, MD Referring Physician: Allie Bossier, MD  Reason for Consultation: Establishing goals of care  HPI/Patient Profile: 83 y.o. male  with past medical history of CAD, CKD admitted on 12/03/2016 with hip fracture with prolonged hospital course including sepsis, cholecystitis s/p drain placement, renal failure, liver failure, delirium and dysphagia.   Clinical Assessment and Goals of Care: I met today with patient's family including his wife, daughter, and 3 sons. We discussed clinical course as well as wishes moving forward in regard to advanced directives.  Concepts specific to code status and rehospitalization discussed.  We discussed difference between a aggressive medical intervention path and a palliative, comfort focused care path.  Values and goals of care important to patient and family were attempted to be elicited.  Concept of Hospice and Palliative Care were discussed  Questions and concerns addressed.   PMT will continue to support holistically.  NEXT OF KIN: Wife   SUMMARY OF RECOMMENDATIONS   - Family report that they feel that it is not clear to them his potential for recovery.  I discussed at length concern regarding his nutrition and the fact that he will develop aspiration at some point in the future.  We discussed PEG tube and family seems in agreement that this is not something that he would want.  They report having difficulty as he appears "better today" to them. - Family would like to continue to see how he does over the next 48 hours.  We have made a follow-up appointmet for Friday at [place]:  DNR   Symptom Management:   Delirium/poor sleep: Plan for addition of zyprexa 84m. D/c ambien.  Dysphagia:  likely multifactorial. Some concern on exam for candidiasis.  On diflucan.  Palliative Prophylaxis:   Frequent Pain Assessment  Prognosis:   guarded  Discharge Planning: To Be Determined      Primary Diagnoses: Present on Admission: . Closed left hip fracture ([facility] . Hip fracture ([facility] . CKD (chronic kidney disease), stage III . Hyperlipidemia . CAD (coronary artery disease)   I have reviewed the medical record, interviewed the patient and family, and examined the patient. The following aspects are pertinent.  Past Medical History:  Diagnosis Date  . Anemia   . Aortic insufficiency 03/09/2016   a. mild-mod AI by echo 03/04/16.  . Arthritis   . Asbestosis ([facility]    "no breathing problems" pt states worked in [employer] many yrs and was exposed to asbestosis  . Bladder outlet obstruction 03/09/2016  . CAD (coronary artery disease)    a. 02/2016: inf lat STEMI with occluded OM2 which was treated with DES; otherwise he had moderate 50% oRCA, 40% oLM, 70% D1, 10% mLAD, 80% lateral 2nd marg which was treated medically.  . Cardiomyopathy, ischemic   . Chronic combined systolic and diastolic CHF (congestive heart failure) ([facility]  a. EF 30-35% at time of STEMI 02/2016, improved to 40-45% by echo 11/20 (but with pericardial effusion). b. Further improved EF to 55-60% 03/04/16.  Marland Kitchen CKD (chronic kidney disease), stage III   . Coronary artery disease   . Dyspnea   . First degree AV block   . Hip fracture ([place] [place]) 11/2016  . Hyperlipidemia   . Hyponatremia   . Lumbar spondylosis   . Myocardial infarction ([place])   . Orthostatic hypotension   . PAF (paroxysmal atrial fibrillation) ([place])    a. dx 02/2016: not anticoagulated due to pericardial effusion; amiodarone stopped due to suspected allergic drug rash.  . Pericardial effusion    a. 02/2016 - followed clinically (mod-large at first then small-mod in f/u echo).   Social History   Social History  . Marital status: Married     Spouse name: N/A  . Number of children: N/A  . Years of education: N/A   Social History Main Topics  . Smoking status: Never Smoker  . Smokeless tobacco: Never Used  . Alcohol use No  . Drug use: No  . Sexual activity: Not Asked   Other Topics Concern  . None   Social History Narrative  . None   Family History  Problem Relation Age of Onset  . Cancer Mother    Scheduled Meds: . aspirin  81 mg Per Tube Daily  . chlorhexidine  15 mL Mouth Rinse BID  . chlorhexidine  15 mL Mouth Rinse BID  . Chlorhexidine Gluconate Cloth  6 each Topical Daily  . free water  400 mL Per Tube 5 X Daily  . insulin aspart  0-20 Units Subcutaneous Q4H  . insulin glargine  10 Units Subcutaneous QHS  . magic mouthwash  5 mL Oral TID  . mouth rinse  15 mL Mouth Rinse q12n4p  . midodrine  10 mg Per Tube TID WC  . tamsulosin  0.4 mg Oral Daily  . vancomycin  500 mg Per Tube Q6H   Continuous Infusions: . sodium chloride Stopped (12/27/16 0300)  . dextrose 50 mL/hr at 12/29/16 0858  . feeding supplement (VITAL AF 1.2 CAL) 1,000 mL (12/29/16 1740)  . fluconazole (DIFLUCAN) IV Stopped (12/29/16 1940)   PRN Meds:.acetaminophen (TYLENOL) oral liquid 160 mg/5 mL, ondansetron, zolpidem Medications Prior to Admission:  Prior to Admission medications   Medication Sig Start Date End Date Taking? Authorizing Provider  aspirin EC 81 MG tablet Take 81 mg by mouth daily with breakfast.    Yes [provider]  atorvastatin (LIPITOR) 20 MG tablet Take 1 tablet (20 mg total) by mouth daily. 04/07/16  Yes Troy Sine, MD  carvedilol (COREG) 3.125 MG tablet Take 3.125 mg by mouth 2 (two) times daily. 11/18/16  Yes [provider]  furosemide (LASIX) 20 MG tablet Take 1 tablet by mouth daily and ok to use extra tablet as needed for swelling. 10/14/16  Yes Cardama, Grayce Sessions, MD  lisinopril (PRINIVIL,ZESTRIL) 2.5 MG tablet Take 1 tablet (2.5 mg total) by mouth daily. 09/21/16 12/20/16 Yes Troy Sine, MD  tamsulosin (FLOMAX) 0.4 MG CAPS capsule TAKE 1 CAPSULE(0.4 MG) BY MOUTH DAILY AFTER SUPPER 04/01/16  Yes Dunn, Nedra Hai, PA-C  ticagrelor (BRILINTA) 90 MG TABS tablet Take 1 tablet (90 mg total) by mouth 2 (two) times daily. 04/07/16  Yes Troy Sine, MD  carvedilol (COREG) 6.25 MG tablet Take 1 tablet (6.25 mg total) by mouth 2 (two) times daily. Patient not taking: Reported on 12/03/2016  04/28/16   Troy Sine, MD   Allergies  Allergen Reactions  . Amiodarone Rash  . Morphine And Related Other (See Comments)    Confusion/ hallucinations    Review of Systems Unable to obtain  Physical Exam General: Seems to attempt to answer questions intermittently.  Follows commands intermittently, in no acute distress.  HEENT: No bruits, no goiter, no JVD.  Extremely dry mouth with greenish plaques Heart: Regular rate and rhythm. No murmur appreciated. Lungs:Fair air movement, clear Abdomen: Soft, nontender, nondistended, positive bowel sounds.   Vital Signs: BP 105/63 (BP Location: Left Arm)   Pulse 62   Temp (!) 97.5 F (36.4 C) (Axillary)   Resp 15   Ht '6\' 1"'  (1.854 m)   Wt 101.7 kg (224 lb 3.3 oz)   SpO2 90%   BMI 29.58 kg/m  Pain Assessment: 0-10   Pain Score: Asleep   SpO2: SpO2: 90 % O2 Device:SpO2: 90 % O2 Flow Rate: .O2 Flow Rate (L/min): 1 L/min  IO: Intake/output summary:  Intake/Output Summary (Last 24 hours) at 12/29/16 2244 Last data filed at 12/29/16 2000  Gross per 24 hour  Intake          3080.17 ml  Output              975 ml  Net          2105.17 ml    LBM: Last BM Date: 12/29/16 Baseline Weight: Weight: 84.9 kg (187 lb 3.2 oz) Most recent weight: Weight: 101.7 kg (224 lb 3.3 oz)     Palliative Assessment/Data:     Time In: 1330 Time Out: 1450 Time Total: 80 Greater than 50%  of this time was spent counseling and coordinating care related to the above assessment and plan.  Signed by: Micheline Rough, MD   Please contact  Palliative Medicine Team phone at [phone] for questions and concerns.  For individual provider: See Shea Evans

## 2016-12-29 NOTE — Progress Notes (Signed)
No charge note:   Palliative consult received.   Meeting scheduled for today at 1pm.  Ocie Bob, AGNP-C Palliative Medicine  Please call Palliative Medicine team phone with any questions [phone]. For individual providers please see AM[facility].

## 2016-12-29 NOTE — Progress Notes (Signed)
PROGRESS NOTE    Danny Oneill  BOF:[id number] DOB: [birth year] DOA: 12/03/2016 PCP: Burnard Bunting, MD   Brief Narrative:  83 y.o. WM PMHx Asbestosis, MI, Ischemic Cardiomyopathy, Chronic Systolic and Diastolic CHF (EF 77-82% on 03/04/2016), First Degree AV block, Paroxysmal Atrial Fibrillation, Pericardial Effusion 02/2016 HLD, CAD, Bladder Outlet Obstruction, CKD stage III   Presents with hip pain. Patient has been well. Taking medications as prescribed. At lunch chair slid from underneath him and patient hit the floor. Instant pain. EMS activated. Patient with physical findings of hip fracture. Transported to the emergency room for further evaluation.     ED course: Patient brought to the emergency room. Pain control with fentanyl. X-ray confirmed hip fracture.    Subjective: 9/25 moans, eyes closed, does not follow commands mumbles unintelligibly.      Assessment & Plan:   Principal Problem:   Closed left hip fracture (HCC) Active Problems:   Hyperlipidemia   CAD (coronary artery disease)   CKD (chronic kidney disease), stage III   Hip fracture (HCC)   Compromised airway   Hypovolemic shock (HCC)   Elevated troponin I level   Chronic combined systolic and diastolic heart failure (HCC)   Acute respiratory failure (HCC)   Elevated LFTs   LEFT hip fracture -9/4/ S/P LEFT intertrochanteric intramedullary nailing. -Left hip appropriately swollen, negative sign of infection   Septic shock/positive E coli UTI; positive C diff colitis -Multifactorial: Escherichia coli UTI, C. difficile colitis, acalculous cholecystitis  -Patient remained hypotensive off of stress dose steroids -Complete 2 week course PO vancomycin -Cholecystostomy tube remains capped (On 9/21), continued altered mental status -Blood/urine culture: NGTD.  -Slight Hypothermic warm blankets  Shock/relative Adrenal insufficiency/Hypotension -BP marginal now off stress dose steroids and  fludrocortisone -Midodrin 10 mg TID -MAP goal> 65  Acute Acalculous Colecystitis  -Abdominal ultrasound with gallbladder sludge/diffuse thickening + Murphy sign Negative icterus, negative jaundice -9/11 S/P percutaneous cholecystostomy drain placed: Capped on 9/21    Elevated LFTs -Improving continue to monitor    Increased ammonia level -Monitor closely  Recent Labs Lab 12/26/16 1433  AMMONIA 30   Acute respiratory failure with hypoxia/Asbestosis -Extubated 9/6 -Currently on room air -Titrate O2 to maintain SPO2 > 93%     Ischemic Cardiomyopathy/Chronic Systolic and Diastolic CHF -Echocardiogram 2/8 LVEF 45-50%.-Grade 1 diastolic dysfunction.-PA peak pressure 51 mmHg -Strict in and out since admission +9.9 m -Daily weight Filed Weights   12/27/16 0341 12/28/16 0315 12/29/16 0305  Weight: 224 lb 6.9 oz (101.8 kg) 225 lb 5 oz (102.2 kg) 224 lb 3.3 oz (101.7 kg)  -BP marginal -BP would not tolerate restarting Brilinta I -gross extravascular fluid overload with low BP will not tolerate diuresis  -Continue ASA -PCXR on 9/26  Pulmonary HTN -see ischemic cardiomyopathy  CAD -S/P DES placed 02/2016 -See ischemic cardiomyopathy  Paroxysmal atrial fibrillation -Currently in NSR  Acute renal failure    Recent Labs Lab 12/25/16 0156 12/26/16 0713 12/26/16 1433 12/27/16 1136 12/28/16 0217 12/29/16 0242  CREATININE 1.02 0.86 0.85 0.88 0.85 0.79  Resolved  Dysphagia -9/21 patient failed swallow study continue tube feeds. -924 patient failed FEES. Discussed at length with daughter the need to make decision on future actions concerning PEG tube. Per daughter age and has living will stating no extreme measures to be taken to save her life. -Counseled given patient's findings unlikely to regain ability to swallow effectively in the short term.  Moderate protein calorie malnutrition -Continue tube feeds   Acute metabolic encephalopathy. -Multifactorial septic  shock,  elevated ammonia, metabolic encephalopathy, hypernatremia, mild uremia -9/25 patient remained encephalopathic, actually worsening, though some waxing and waning.  -Recheck Aammonia level on 9/26   Hyperglycemia -9/9 Hemoglobin A1c = 5.8, does not meet criteria for prediabetes/diabetes -Lantus 10 units daily -Distant SSI   Hypokalemia -Potassium goal> 4  -Potassium 60 mEq  Hypernatremia -9/25 increase Free water 46m 5x per day  -D5 W 552mhr  BPH -Flomax 0.4 mg daily  Oral candidiasis -Diflucan per pharmacy -Magic mouthwash   Goals of care 9/25 PALLIATIVE care consult placed: Per daughter met with palliative care family having a hard time coming to his decision. I will obtain head CT to ensure no CVA, echocardiogram pending to determine if worsening heart failure hopefully will help family to see that patient likelihood of survival is extremely poor    DVT prophylaxis: SCD Code Status: DNR Family Communication: Daughter present at bedside for discussion of plan care  Disposition Plan: TBD   [facility]  [name] Dr. WiArta Silence Procedures/Significant Events:  8/30 Admit 9/4 S/P LEFT intertrochantric intramedullary nailing  9/4 intubated 9/04  Surgery/ tx to ICU for hypotension 9/6 extubated 9/8 abdominal ultrasound: Negative gallstones, slight sludge in the gallbladder and there is slight diffuse thickening of the gallbladder wall. Negative sonographic Murphy's sign. 9/10: GI, surgical service and IR consulted  9/11 cholecystostomy tube placed 9/21 IR capped percutaneous cholecystostomy drain   I have personally reviewed and interpreted all radiology studies and my findings are as above.  VENTILATOR SETTINGS:    Cultures 9/4 BC >> negative 9/5 urine  >> positive E coli 9/11 blood culture>>>neg 9/11 perc drain negative 9/12 c diff antigen positive, toxigenic pcr positive 9/22 blood pending  9/22 urine  pending     Antimicrobials:Anti-infectives    Start     Stop   12/27/16 1700  fluconazole (DIFLUCAN) IVPB 400 mg         12/18/16 0000  vancomycin (VANCOCIN) 50 mg/mL oral solution 500 mg     12/31/16 0904   12/17/16 1800  vancomycin (VANCOCIN) 50 mg/mL oral solution 500 mg  Status:  Discontinued     12/17/16 2333   12/16/16 1800  vancomycin (VANCOCIN) 50 mg/mL oral solution 125 mg  Status:  Discontinued     12/17/16 1509   12/16/16 1800  cefTAZidime (FORTAZ) 2 g in dextrose 5 % 50 mL IVPB  Status:  Discontinued     12/21/16 1323   12/14/16 1215  piperacillin-tazobactam (ZOSYN) IVPB 3.375 g  Status:  Discontinued     12/16/16 1740   12/12/16 1200  ampicillin (OMNIPEN) 1 g in sodium chloride 0.9 % 50 mL IVPB  Status:  Discontinued     12/14/16 1127   12/09/16 0100  ceFAZolin (ANCEF) IVPB 1 g/50 mL premix     12/09/16 1557   12/08/16 2300  cefTRIAXone (ROCEPHIN) 1 g in dextrose 5 % 50 mL IVPB  Status:  Discontinued     12/12/16 1002   12/04/16 0600  ceFAZolin (ANCEF) IVPB 2g/100 mL premix     12/05/16 0559       Devices    LINES / TUBES:  Left Radial Aline 9/4 >>9/12 R TL IJ CVC 9/4  >>  9/20 ETT   9/4 >> 9/6 Perc GB drain 9/12>> capped on 9/21     Continuous Infusions: . sodium chloride Stopped (12/27/16 0300)  . feeding supplement (VITAL AF 1.2 CAL) 1,000 mL (12/28/16 2322)  . fluconazole (  DIFLUCAN) IV Stopped (12/28/16 1950)     Objective: Vitals:   12/28/16 1700 12/28/16 1943 12/28/16 2304 12/29/16 0305  BP: 120/73 128/76 123/88 133/75  Pulse:  66 64 66  Resp: '14 16 17 13  ' Temp: (!) 97.2 F (36.2 C) (!) 97.3 F (36.3 C) (!) 97.4 F (36.3 C) 97.6 F (36.4 C)  TempSrc: Axillary Axillary Axillary Axillary  SpO2: 100% 100% 99% 97%  Weight:    224 lb 3.3 oz (101.7 kg)  Height:        Intake/Output Summary (Last 24 hours) at 12/29/16 0759 Last data filed at 12/29/16 6553  Gross per 24 hour  Intake             1965 ml  Output             2300 ml  Net              -335 ml   Filed Weights   12/27/16 0341 12/28/16 0315 12/29/16 0305  Weight: 224 lb 6.9 oz (101.8 kg) 225 lb 5 oz (102.2 kg) 224 lb 3.3 oz (101.7 kg)   Physical Exam:  General:  obtunded, moans to painful stimuli, follows no commands mumbles unintelligibly, No acute respiratory distress Eyes: negative scleral hemorrhage, negative anisocoria, negative  icterus ENT: Positive oropharyngeal candidiasis (improving), positive dry blood on tongue/gingival (improving), positive tongue lesions from intubation healing well Lungs: Clear to auscultation bilaterally without wheezes or crackles Cardiovascular: Regular rate and rhythm without murmur gallop or rub normal S1 and S2 Abdomen: Negative  abdominal pain,  positive mild distention,  positive soft, bowel sounds, no rebound, no ascites, no appreciable mass Extremities: No significant cyanosis, clubbing, anasarca Skin:  jaundice resolved Psychiatric:  Cannot accurately evaluate secondary to altered mental status. Central nervous system:  Cranial nerves II through XII intact, tongue/uvula midline, moved all extremities to command, positive dysarthria, negative expressive aphasia, negative receptive aphasia.       Data Reviewed: Care during the described time interval was provided by me .  I have reviewed this patient's available data, including medical history, events of note, physical examination, and all test results as part of my evaluation.   CBC:  Recent Labs Lab 12/23/16 0338 12/24/16 0523 12/25/16 0156 12/26/16 1116 12/29/16 0440  WBC 10.2 10.4 10.2 12.7* 11.6*  NEUTROABS  --  8.9* 9.1*  --   --   HGB 8.7* 9.9* 9.7* 10.3* 10.7*  HCT 27.4* 31.0* 31.3* 33.1* 35.3*  MCV 96.5 99.0 99.7 103.4* 106.6*  PLT 354 343 329 325 748   Basic Metabolic Panel:  Recent Labs Lab 12/24/16 1940 12/25/16 0156 12/26/16 0713 12/26/16 1433 12/27/16 1136 12/28/16 0217 12/29/16 0242  NA  --  145 147* 149* 153* 151* 150*  K 3.5 3.4*  4.3 4.0 4.5 4.2 3.3*  CL  --  115* 119* 122* 123* 119* 119*  CO2  --  '22 23 22 25 24 24  ' GLUCOSE  --  215* 180* 199* 194* 198* 153*  BUN  --  77* 70* 67* 68* 66* 67*  CREATININE  --  1.02 0.86 0.85 0.88 0.85 0.79  CALCIUM  --  7.8* 7.9* 7.8* 8.1* 8.0* 7.9*  MG 2.2 2.2 2.2  --  2.3 2.1  --    GFR: Estimated Creatinine Clearance: 87.7 mL/min (by C-G formula based on SCr of 0.79 mg/dL). Liver Function Tests:  Recent Labs Lab 12/23/16 0338 12/26/16 1433 12/28/16 0217 12/29/16 0242  AST 123* 77* 89* 80*  ALT 111* 120* 113* 126*  ALKPHOS 102 127* 121 137*  BILITOT 10.4* 7.1* 6.6* 6.2*  PROT 5.1* 5.1* 5.2* 5.1*  ALBUMIN 1.4* 1.7* 2.4* 2.3*   No results for input(s): LIPASE, AMYLASE in the last 168 hours.  Recent Labs Lab 12/26/16 1433  AMMONIA 30   Coagulation Profile: No results for input(s): INR, PROTIME in the last 168 hours. Cardiac Enzymes: No results for input(s): CKTOTAL, CKMB, CKMBINDEX, TROPONINI in the last 168 hours. BNP (last 3 results) No results for input(s): PROBNP in the last 8760 hours. HbA1C: No results for input(s): HGBA1C in the last 72 hours. CBG:  Recent Labs Lab 12/28/16 1306 12/28/16 1707 12/28/16 1941 12/28/16 2305 12/29/16 0307  GLUCAP 184* 176* 161* 152* 149*   Lipid Profile: No results for input(s): CHOL, HDL, LDLCALC, TRIG, CHOLHDL, LDLDIRECT in the last 72 hours. Thyroid Function Tests:  Recent Labs  12/29/16 0242  TSH 1.765   Anemia Panel:  Recent Labs  12/29/16 0242 12/29/16 0440  VITAMINB12 3,868*  --   FOLATE 23.5  --   FERRITIN 472*  --   TIBC 195*  --   IRON 73  --   RETICCTPCT  --  6.9*   Urine analysis:    Component Value Date/Time   COLORURINE YELLOW 12/08/2016 2015   APPEARANCEUR TURBID (A) 12/08/2016 2015   LABSPEC 1.012 12/08/2016 2015   PHURINE 5.0 12/08/2016 2015   GLUCOSEU NEGATIVE 12/08/2016 2015   HGBUR LARGE (A) 12/08/2016 2015   BILIRUBINUR NEGATIVE 12/08/2016 2015   [place]  12/08/2016 2015   PROTEINUR 100 (A) 12/08/2016 2015   UROBILINOGEN 1.0 09/20/2013 0851   NITRITE NEGATIVE 12/08/2016 2015   LEUKOCYTESUR MODERATE (A) 12/08/2016 2015   Sepsis Labs: '@LABRCNTIP' (procalcitonin:4,lacticidven:4)  ) Recent Results (from the past 240 hour(s))  Culture, blood (routine x 2)     Status: None (Preliminary result)   Collection Time: 12/26/16  2:30 PM  Result Value Ref Range Status   Specimen Description BLOOD RIGHT ARM  Final   Special Requests IN PEDIATRIC BOTTLE Blood Culture adequate volume  Final   Culture NO GROWTH 2 DAYS  Final   Report Status PENDING  Incomplete  Culture, blood (routine x 2)     Status: None (Preliminary result)   Collection Time: 12/26/16  2:38 PM  Result Value Ref Range Status   Specimen Description BLOOD LEFT HAND  Final   Special Requests   Final    BOTTLES DRAWN AEROBIC ONLY Blood Culture adequate volume   Culture NO GROWTH 2 DAYS  Final   Report Status PENDING  Incomplete  Culture, Urine     Status: None   Collection Time: 12/26/16  6:19 PM  Result Value Ref Range Status   Specimen Description URINE, RANDOM  Final   Special Requests NONE  Final   Culture NO GROWTH  Final   Report Status 12/27/2016 FINAL  Final         Radiology Studies: Dg Hip Port Unilat With Pelvis 1v Left  Result Date: 12/29/2016 CLINICAL DATA:  Left hip fracture EXAM: DG HIP (WITH OR WITHOUT PELVIS) 1V PORT LEFT COMPARISON:  Fluoroscopy 12/08/2016 FINDINGS: Status post ORIF of an intertrochanteric left femur fracture. A lesser trochanteric fracture fragment appears more distracted. Opaque structure over the pelvis is likely a rectal tube. Distorted pelvic ring due to rotation. No evidence of fracture. IMPRESSION: Recent left intertrochanteric femur fracture and ORIF. Greater distraction of the lesser trochanteric fragment compared to fluoroscopy 12/08/2016. The hardware is  in stable position. Electronically Signed   By: Monte Fantasia M.D.   On:  12/29/2016 07:45        Scheduled Meds: . aspirin  81 mg Per Tube Daily  . chlorhexidine  15 mL Mouth Rinse BID  . chlorhexidine  15 mL Mouth Rinse BID  . Chlorhexidine Gluconate Cloth  6 each Topical Daily  . free water  400 mL Per Tube Q6H  . insulin aspart  0-20 Units Subcutaneous Q4H  . insulin glargine  10 Units Subcutaneous QHS  . magic mouthwash  5 mL Oral TID  . mouth rinse  15 mL Mouth Rinse q12n4p  . midodrine  10 mg Per Tube TID WC  . tamsulosin  0.4 mg Oral Daily  . vancomycin  500 mg Per Tube Q6H   Continuous Infusions: . sodium chloride Stopped (12/27/16 0300)  . feeding supplement (VITAL AF 1.2 CAL) 1,000 mL (12/28/16 2322)  . fluconazole (DIFLUCAN) IV Stopped (12/28/16 1950)     LOS: 26 days    Time spent: 40 minutes    Danny Oneill, Geraldo Docker, MD [facility] Pager [phone]   If 7PM-7AM, please contact night-coverage [email or web address] Password [email or web address] 12/29/2016, 7:59 AM

## 2016-12-30 ENCOUNTER — Inpatient Hospital Stay (HOSPITAL_COMMUNITY): Payer: PPO

## 2016-12-30 DIAGNOSIS — I351 Nonrheumatic aortic (valve) insufficiency: Secondary | ICD-10-CM

## 2016-12-30 LAB — GLUCOSE, CAPILLARY
GLUCOSE-CAPILLARY: 123 mg/dL — AB (ref 65–99)
GLUCOSE-CAPILLARY: 133 mg/dL — AB (ref 65–99)
GLUCOSE-CAPILLARY: 67 mg/dL (ref 65–99)
GLUCOSE-CAPILLARY: 82 mg/dL (ref 65–99)
Glucose-Capillary: 141 mg/dL — ABNORMAL HIGH (ref 65–99)
Glucose-Capillary: 158 mg/dL — ABNORMAL HIGH (ref 65–99)
Glucose-Capillary: 129 mg/dL — ABNORMAL HIGH (ref 65–99)
Glucose-Capillary: 111 mg/dL — ABNORMAL HIGH (ref 65–99)
Glucose-Capillary: 138 mg/dL — ABNORMAL HIGH (ref 65–99)

## 2016-12-30 LAB — COMPREHENSIVE METABOLIC PANEL
ALBUMIN: 2.2 g/dL — AB (ref 3.5–5.0)
ALK PHOS: 166 U/L — AB (ref 38–126)
ALT: 135 U/L — AB (ref 17–63)
AST: 90 U/L — AB (ref 15–41)
Anion gap: 6 (ref 5–15)
BILIRUBIN TOTAL: 6.2 mg/dL — AB (ref 0.3–1.2)
BUN: 60 mg/dL — ABNORMAL HIGH (ref 6–20)
CALCIUM: 7.7 mg/dL — AB (ref 8.9–10.3)
CO2: 25 mmol/L (ref 22–32)
CREATININE: 0.79 mg/dL (ref 0.61–1.24)
Chloride: 116 mmol/L — ABNORMAL HIGH (ref 101–111)
GFR calc Af Amer: >60 mL/min (ref >60)
GFR calc non Af Amer: >60 mL/min (ref >60)
Glucose, Bld: 158 mg/dL — ABNORMAL HIGH (ref 65–99)
POTASSIUM: 3.7 mmol/L (ref 3.5–5.1)
Sodium: 147 mmol/L — ABNORMAL HIGH (ref 135–145)
TOTAL PROTEIN: 5.2 g/dL — AB (ref 6.5–8.1)

## 2016-12-30 LAB — ECHOCARDIOGRAM COMPLETE
HEIGHTINCHES: 73 in
WEIGHTICAEL: 3597.91 [oz_av]

## 2016-12-30 LAB — AMMONIA: AMMONIA: 24 umol/L (ref 9–35)

## 2016-12-30 MED ORDER — SODIUM BICARBONATE 650 MG PO TABS
650.0000 mg | ORAL_TABLET | Freq: Once | ORAL | Status: DC
  Filled 2016-12-30: qty 1

## 2016-12-30 MED ORDER — PANCRELIPASE (LIP-PROT-AMYL) 12000-38000 UNITS PO CPEP: 36000.0000 [IU] | ORAL_CAPSULE | Freq: Once | ORAL | Status: DC

## 2016-12-30 NOTE — Progress Notes (Signed)
  Echocardiogram 2D Echocardiogram has been performed.  Danny Oneill T Taneisha Fuson 12/30/2016, 9:42 AM

## 2016-12-30 NOTE — Progress Notes (Signed)
Cortrak Tube Team Note:  Consult received to place a Cortrak feeding tube.   A 10 F Cortrak tube was placed in the left nare and secured with a nasal bridle at 83 cm. Per the Cortrak monitor reading the tube tip is post pyloric.   No x-ray is required. RN may begin using tube.   If the tube becomes dislodged please keep the tube and contact the Cortrak team at [email or web address] (password [email or web address]) for replacement.  If after hours and replacement cannot be delayed, place a NG tube and confirm placement with an abdominal x-ray.    Betsey Holiday MS, RD, LDN Pager #- [phone] After Hours Pager: [phone]

## 2016-12-30 NOTE — Progress Notes (Signed)
  Speech Language Pathology Treatment: Dysphagia  Patient Details Name: Danny Oneill MRN: [id number] DOB: Feb 26, 1932 Today's Date: 12/30/2016 Time: 4715-9539 SLP Time Calculation (min) (ACUTE ONLY): 28 min  Assessment / Plan / Recommendation Clinical Impression  Visited pt this am with sister at bedside. RN had just completed oral care, with noted increased moisture in oral cavity.  Pt was able to orally manipulate ice chips and initiate a delayed, effortful swallow with a grimace indicating pain with swallow. Pt needed breaks for oral breathing during ice chip consumption due to no ability to breathe through his nose. SLP was able to remove thick secretions from the posterior pharyngeal wall as well as dried nasal secretions. Also applied Yankauer to base of tongue during coughing attempt to remove thick pharyngeal secretions.  Pts severe dysphagia is secondary to prolonged open mouth breathing during chronic illness and deconditioned state. Requested RN offer humidified air to moistened oral and nasal cavity over the next day in addition to 1-2 ice chips during her visits in the room. Also discussed plan with pts daughter over the phone. Will f/u tomorrow in am to assess pt for improvement. Encouraged pts family to consider measures to liberalize access to PO with known risk of aspiration to allow improved oral and oropharyngeal hydration and decrease pts discomfort with potential for improved, but risky, swallow function.    HPI HPI: 83 yo male with PMH of HLD, lumbar spondylosis, MI, CAD, PAF, cardiomyopathy, CKD, CHF who was admitted on 8/30 with L hip fracture. He developed hypoxia and hypotension after surgery (L IM nail) on 9/4 and required re-intubation 9/4-9/6. Pt with septic shock from UTI, then found to have c diff, also with acute acaclulous colecystitis s/p perc drain 9/11. SLP ordered for swallow evaluation as pt's alertness began to improve.      SLP Plan  Continue with current plan of  care       Recommendations  Diet recommendations: NPO (except for ice chips with RN) Medication Administration: Via alternative means                Oral Care Recommendations: Oral care QID Follow up Recommendations: Skilled Nursing facility SLP Visit Diagnosis: Dysphagia, oropharyngeal phase (R13.12) Plan: Continue with current plan of care       GO                Jazzalynn Rhudy, Riley Nearing 12/30/2016, 1:23 PM

## 2016-12-30 NOTE — Progress Notes (Signed)
[place] TEAM 1 - Stepdown/ICU Deshane Cotroneo  ION:[id number] DOB: Oct 13, 1931 DOA: 12/03/2016 PCP: Geoffry Paradise, MD    Brief Narrative:  83yo M w/ a hx of asbestosis, CAD, Chronic CHF, First Degree AV block, Paroxysmal Atrial Fibrillation, HLD, Bladder Outlet Obstruction, and CKD stage III who presented with hip pain after a chair slid from underneath him causing him to fall to the floor.    In the ED an X-ray confirmed a hip fracture.  Significant Events: 8/30 Admit 9/4 intubated - S/P LEFT intertrochantric intramedullary nailing - tx to ICU for hypotension 9/6 extubated 9/8 [place] - negative gallstones, slight sludge in the gallbladder and there is slight diffuse thickening of the gallbladder wall. Negative sonographic Murphy's sign. 9/10 GI, Surgery, and IR consulted  9/11 cholecystostomy tube placed 9/21 IR capped percutaneous cholecystostomy drain  Subjective: The patient appears somewhat more alert today.  His speech is mostly unintelligible but at times he is able to respond appropriately.  He remains mildly agitated however and mittens are being required to protect his medical equipment.  He does not appear to be short of breath nor is there evidence of uncontrolled pain.  Assessment & Plan:  Acute metabolic encephalopathy. Multifactorial:  septic shock, metabolic encephalopathy, high dose steroids, hypernatremia, mild uremia - appears to perhaps be slowly improving - avoid sedatives as possible   LEFT hip fracture S/P LEFT intertrochanteric intramedullary nailing 9/4  Septic shock due to E coli UTI Shock resolved - f/u urine and blood cx w/o growth s/p abx course - now off broad spectrum abx   C diff colitis Complete 2 week course PO Vancomycin - diarrhea persists     Relative Adrenal insufficiency / Hypotension Now off stress dose steroids and is hemodynamically stable   Acute Acalculous Colecystitis  Abdominal US noted gallbladder sludge/diffuse thickening  - + Murphy sign on exam - 9/11 S/P percutaneous cholecystostomy drain > capped 9/21 w/ IR directing care of this issue    Elevated LFTs and ammonia level LFTs and Tbil remain elevated - follow trend   Acute respiratory failure with hypoxia / Chronic Asbestosis Resolved w/ pt now on RA   Chronic Systolic and Diastolic CHF TTE 05/10/38 EF 45-50% w/ grade 1 diastolic dysfunction - developing low grade anasarca which is likely due primarily to low albumin - avoid diuresis for now in setting of hypernatremia   Filed Weights   12/28/16 0315 12/29/16 0305 12/30/16 0455  Weight: 102.2 kg (225 lb 5 oz) 101.7 kg (224 lb 3.3 oz) 102 kg (224 lb 13.9 oz)    CAD S/P DES 02/2016  Paroxysmal atrial fibrillation Currently in NSR  Acute renal failure  Resolved w/ crt now normal   Dysphagia 9/21 failed swallow study - continue tube feeds - 9/24 still not clear for oral intake - family discussing options and meeting w/ Palliative Care, but all agree the pt would NOT want a PEG - attempt to de-clog Cortrak today   Moderate protein calorie malnutrition Continue tube feeds if tube can be unclogged - if not will remove for now and await decision on replacement after family discussion on Friday   Hypernatremia Cont free water via NG and follow trend - improving   BPH Flomax 0.4 mg daily  Oral candidiasis Cont Diflucan   Goals of Care  Palliative Care meeting w/ the patient, with a family meeting scheduled for 9/28   DVT prophylaxis: SCDs Code Status: DNR - NO CODE Family Communication: spoke to daughter-in-law at  bedside  Disposition Plan: SDU  Consultants:  IR PCCM Orthopedics GI  Antimicrobials:  Oral Vanc 9/12 >  Objective: Blood pressure 106/63, pulse 64, temperature 97.8 F (36.6 C), temperature source Axillary, resp. rate 19, height  (1.854 m), weight 102 kg (224 lb 13.9 oz), SpO2 95 %.  Intake/Output Summary (Last 24 hours) at 12/30/16 1400 Last data filed at  12/30/16 0200  Gross per 24 hour  Intake          2151.67 ml  Output              410 ml  Net          1741.67 ml   Filed Weights   12/28/16 0315 12/29/16 0305 12/30/16 0455  Weight: 102.2 kg (225 lb 5 oz) 101.7 kg (224 lb 3.3 oz) 102 kg (224 lb 13.9 oz)    Examination: General: No acute respiratory distress - somewhat more alert  Lungs: Clear to auscultation bilaterally - no wheezing  Cardiovascular: Regular rate without murmur  Abdomen: Nontender, nondistended, soft, bowel sounds positive, no rebound Extremities: 2+ dependent LE edema   CBC:  Recent Labs Lab 12/24/16 0523 12/25/16 0156 12/26/16 1116 12/29/16 0440  WBC 10.4 10.2 12.7* 11.6*  NEUTROABS 8.9* 9.1*  --   --   HGB 9.9* 9.7* 10.3* 10.7*  HCT 31.0* 31.3* 33.1* 35.3*  MCV 99.0 99.7 103.4* 106.6*  PLT 343 329 325 208   Basic Metabolic Panel:  Recent Labs Lab 12/24/16 1940 12/25/16 0156 12/26/16 0713 12/26/16 1433 12/27/16 1136 12/28/16 0217 12/29/16 0242 12/30/16 0209  NA  --  145 147* 149* 153* 151* 150* 147*  K 3.5 3.4* 4.3 4.0 4.5 4.2 3.3* 3.7  CL  --  115* 119* 122* 123* 119* 119* 116*  CO2  --  GLUCOSE  --  215* 180* 199* 194* 198* 153* 158*  BUN  --  77* 70* 67* 68* 66* 67* 60*  CREATININE  --  1.02 0.86 0.85 0.88 0.85 0.79 0.79  CALCIUM  --  7.8* 7.9* 7.8* 8.1* 8.0* 7.9* 7.7*  MG 2.2 2.2 2.2  --  2.3 2.1  --   --    GFR: Estimated Creatinine Clearance: 87.8 mL/min (by C-G formula based on SCr of 0.79 mg/dL).  Liver Function Tests:  Recent Labs Lab 12/26/16 1433 12/28/16 0217 12/29/16 0242 12/30/16 0209  AST 77* 89* 80* 90*  ALT 120* 113* 126* 135*  ALKPHOS 127* 121 137* 166*  BILITOT 7.1* 6.6* 6.2* 6.2*  PROT 5.1* 5.2* 5.1* 5.2*  ALBUMIN 1.7* 2.4* 2.3* 2.2*    Recent Labs Lab 12/26/16 1433 12/30/16 0209  AMMONIA 30 24    HbA1C: Hgb A1c MFr Bld  Date/Time Value Ref Range Status  12/13/2016 03:35 AM 5.8 (H) 4.8 - 5.6 % Final    Comment:     (NOTE)         Prediabetes: 5.7 - 6.4         Diabetes: >6.4         Glycemic control for adults with diabetes: <7.0   02/02/2016 11:18 PM 5.9 (H) 4.8 - 5.6 % Final    Comment:    (NOTE)         Pre-diabetes: 5.7 - 6.4         Diabetes: >6.4         Glycemic control for adults with diabetes: <7.0     CBG:  Recent Labs  Lab 12/29/16 1927 12/30/16 0017 12/30/16 0348 12/30/16 0733 12/30/16 1131  GLUCAP 133* 123* 141* 158* 129*    Recent Results (from the past 240 hour(s))  Culture, blood (routine x 2)     Status: None (Preliminary result)   Collection Time: 12/26/16  2:30 PM  Result Value Ref Range Status   Specimen Description BLOOD RIGHT ARM  Final   Special Requests IN PEDIATRIC BOTTLE Blood Culture adequate volume  Final   Culture NO GROWTH 3 DAYS  Final   Report Status PENDING  Incomplete  Culture, blood (routine x 2)     Status: None (Preliminary result)   Collection Time: 12/26/16  2:38 PM  Result Value Ref Range Status   Specimen Description BLOOD LEFT HAND  Final   Special Requests   Final    BOTTLES DRAWN AEROBIC ONLY Blood Culture adequate volume   Culture NO GROWTH 3 DAYS  Final   Report Status PENDING  Incomplete  Culture, Urine     Status: None   Collection Time: 12/26/16  6:19 PM  Result Value Ref Range Status   Specimen Description URINE, RANDOM  Final   Special Requests NONE  Final   Culture NO GROWTH  Final   Report Status 12/27/2016 FINAL  Final     Scheduled Meds: . aspirin  81 mg Per Tube Daily  . chlorhexidine  15 mL Mouth Rinse BID  . chlorhexidine  15 mL Mouth Rinse BID  . Chlorhexidine Gluconate Cloth  6 each Topical Daily  . free water  400 mL Per Tube 5 X Daily  . insulin aspart  0-20 Units Subcutaneous Q4H  . insulin glargine  10 Units Subcutaneous QHS  . magic mouthwash  5 mL Oral TID  . mouth rinse  15 mL Mouth Rinse q12n4p  . midodrine  10 mg Per Tube TID WC  . OLANZapine  5 mg Per Tube QHS  . tamsulosin  0.4 mg Oral Daily  .  vancomycin  500 mg Per Tube Q6H     LOS: 27 days   Lonia Blood, MD [facility] Office  [phone] Pager - Text Page per Amion as per below:  On-Call/Text Page:      [email or web address]      password [email or web address]  If 7PM-7AM, please contact night-coverage [email or web address] Password [email or web address] 12/30/2016, 2:00 PM

## 2016-12-30 NOTE — Progress Notes (Signed)
Spoke with patient's son about removing foley catheter. Son reported that when patient went home after a heart attack that he needed to have a foley bag at home due to urinary issues. Son is worried that removal will cause more problems. Son also shared that the family is going to make decisions about care, palliative vs aggressive care, on Friday. He stated he thinks it would be better to leave the foley catheter until that decision is made.

## 2016-12-31 DIAGNOSIS — Z8781 Personal history of (healed) traumatic fracture: Secondary | ICD-10-CM

## 2016-12-31 LAB — COMPREHENSIVE METABOLIC PANEL
ALBUMIN: 2 g/dL — AB (ref 3.5–5.0)
ALK PHOS: 179 U/L — AB (ref 38–126)
ALT: 129 U/L — AB (ref 17–63)
AST: 94 U/L — AB (ref 15–41)
Anion gap: 7 (ref 5–15)
BILIRUBIN TOTAL: 5.1 mg/dL — AB (ref 0.3–1.2)
BUN: 54 mg/dL — AB (ref 6–20)
CALCIUM: 7.5 mg/dL — AB (ref 8.9–10.3)
CO2: 24 mmol/L (ref 22–32)
CREATININE: 0.73 mg/dL (ref 0.61–1.24)
Chloride: 112 mmol/L — ABNORMAL HIGH (ref 101–111)
GFR calc Af Amer: >60 mL/min (ref >60)
GLUCOSE: 129 mg/dL — AB (ref 65–99)
POTASSIUM: 4.4 mmol/L (ref 3.5–5.1)
Sodium: 143 mmol/L (ref 135–145)
TOTAL PROTEIN: 4.6 g/dL — AB (ref 6.5–8.1)

## 2016-12-31 LAB — CBC WITH DIFFERENTIAL/PLATELET
BASOS PCT: 0 %
Basophils Absolute: 0 10*3/uL (ref 0.0–0.1)
EOS ABS: 0.4 10*3/uL (ref 0.0–0.7)
EOS PCT: 4 %
HCT: 34.1 % — ABNORMAL LOW (ref 39.0–52.0)
HEMOGLOBIN: 10.3 g/dL — AB (ref 13.0–17.0)
LYMPHS PCT: 10 %
Lymphs Abs: 1.1 10*3/uL (ref 0.7–4.0)
MCH: 32.4 pg (ref 26.0–34.0)
MCHC: 30.2 g/dL (ref 30.0–36.0)
MCV: 107.2 fL — AB (ref 78.0–100.0)
MONO ABS: 0.4 10*3/uL (ref 0.1–1.0)
Monocytes Relative: 4 %
NEUTROS ABS: 9.1 10*3/uL — AB (ref 1.7–7.7)
NEUTROS PCT: 82 %
Platelets: 137 10*3/uL — ABNORMAL LOW (ref 150–400)
RBC: 3.18 MIL/uL — ABNORMAL LOW (ref 4.22–5.81)
RDW: 27.3 % — ABNORMAL HIGH (ref 11.5–15.5)
WBC: 11 10*3/uL — ABNORMAL HIGH (ref 4.0–10.5)

## 2016-12-31 LAB — GLUCOSE, CAPILLARY
GLUCOSE-CAPILLARY: 143 mg/dL — AB (ref 65–99)
GLUCOSE-CAPILLARY: 100 mg/dL — AB (ref 65–99)
Glucose-Capillary: 138 mg/dL — ABNORMAL HIGH (ref 65–99)
Glucose-Capillary: 132 mg/dL — ABNORMAL HIGH (ref 65–99)
Glucose-Capillary: 147 mg/dL — ABNORMAL HIGH (ref 65–99)
Glucose-Capillary: 143 mg/dL — ABNORMAL HIGH (ref 65–99)

## 2016-12-31 LAB — CULTURE, BLOOD (ROUTINE X 2)
Culture: NO GROWTH
Culture: NO GROWTH
SPECIAL REQUESTS: ADEQUATE
Special Requests: ADEQUATE

## 2016-12-31 NOTE — Progress Notes (Signed)
  Speech Language Pathology Treatment: Dysphagia  Patient Details Name: Danny Oneill MRN: [id number] DOB: [birth year] Today's Date: 12/31/2016 Time: 1324-4010 SLP Time Calculation (min) (ACUTE ONLY): 39 min  Assessment / Plan / Recommendation Clinical Impression  Pt demonstrates significant improvement in the health of his lingual and and oral mucosa. With oral care, further dried secretions were removed and pt was the most intelligible to family members that he has been recently. With Po trials pt is better able to sustain oral closure and utilize nose for respiration. Immediate signs of aspiration observed with ice and water, but pt consumed 2 oz of puree without coughing or throat clearing, though residuals are suspected.   Patients two sons were present during treatment and we discussed potential plan of care at [facility]. Emphasized the benefit of initiating PO intake with known risk of aspiration as this may be the only way for potential recovery of the swallow mechanism. Keeping pt NPO will only lead to further deterioration of oral mucosa, proliferation or oral bacteria and would not improve risk of bacterial aspiration pneumonia.  Given improvements in function, will plan on FEES tomorrow am to reassess best diet textures for feeding with known risk as multiple family members have indicated this will likely be their choice for his plan of care.    HPI HPI: 83 yo male with PMH of HLD, lumbar spondylosis, MI, CAD, PAF, cardiomyopathy, CKD, CHF who was admitted on 8/30 with L hip fracture. He developed hypoxia and hypotension after surgery (L IM nail) on 9/4 and required re-intubation 9/4-9/6. Pt with septic shock from UTI, then found to have c diff, also with acute acaclulous colecystitis s/p perc drain 9/11. SLP ordered for swallow evaluation as pt's alertness began to improve.      SLP Plan  Continue with current plan of care       Recommendations  Diet recommendations: NPO (ice  chips) Medication Administration: Via alternative means                Oral Care Recommendations: Oral care QID Follow up Recommendations: Skilled Nursing facility SLP Visit Diagnosis: Dysphagia, oropharyngeal phase (R13.12) Plan: Continue with current plan of care       GO               [facility], [place] CCC-SLP [phone]  Claudine Mouton 12/31/2016, 9:50 AM

## 2016-12-31 NOTE — Progress Notes (Signed)
PROGRESS NOTE    Danny Oneill  JWJ:[id number] DOB: 03/19/1932 DOA: 12/03/2016 PCP: Geoffry Paradise, MD   Brief Narrative:  83 y.o. WM PMHx Asbestosis, MI, Ischemic Cardiomyopathy, Chronic Systolic and Diastolic CHF (EF 62-13% on 03/04/2016), First Degree AV block, Paroxysmal Atrial Fibrillation, Pericardial Effusion 02/2016 HLD, CAD, Bladder Outlet Obstruction, CKD stage III   Presents with hip pain. Patient has been well. Taking medications as prescribed. At lunch chair slid from underneath him and patient hit the floor. Instant pain. EMS activated. Patient with physical findings of hip fracture. Transported to the emergency room for further evaluation.     ED course: Patient brought to the emergency room. Pain control with fentanyl. X-ray confirmed hip fracture.    Subjective: 9/27 eyes closed moans to painful stimuli, does not follow commands.       Assessment & Plan:   Principal Problem:   Closed left hip fracture (HCC) Active Problems:   Hyperlipidemia   CAD (coronary artery disease)   CKD (chronic kidney disease), stage III   Hip fracture (HCC)   Compromised airway   Hypovolemic shock (HCC)   Elevated troponin I level   Chronic combined systolic and diastolic heart failure (HCC)   Acute respiratory failure (HCC)   Elevated LFTs   LEFT hip fracture -9/4/ S/P LEFT intertrochanteric intramedullary nailing. -appropriately swollen negative sign of infection  Septic shock/positive E coli UTI; positive C diff colitis -Multifactorial: Escherichia coli UTI, C. difficile colitis, acalculous cholecystitis  -off stress dose steroids BP soft but somewhat improved -Complete 2 week course PO vancomycin -Cholecystostomy tube remains capped(On 9/21), worsening AMS -Blood/urine culture: NGTD.   Shock/relative Adrenal insufficiency/Hypotension -BP marginal now off stress dose steroids and fludrocortisone -Midodrin 10 mg TID -MAP goal> 65  Acute Acalculous Colecystitis    -Abdominal ultrasound with gallbladder sludge/diffuse thickening + Murphy sign Negative icterus, negative jaundice -9/11 S/P percutaneous cholecystostomy drain placed: Capped on 9/21    Elevated LFTs -LFTs continue to worsen-most likely secondary to multiple episodes of hypotension. Continue to trend     Increased ammonia level  -Monitor closely    Recent Labs Lab 12/26/16 1433 12/30/16 0209  AMMONIA 30 24   Acute respiratory failure with hypoxia/Asbestosis -Extubated 9/6 -currently on room air -titrate O2 to maintain SPO2 > 93%     Ischemic Cardiomyopathy/Chronic Systolic and Diastolic CHF -Echocardiogram 2/8 LVEF 45-50%.-Grade 1 diastolic dysfunction.-PA peak pressure 51 mmHg -Strict in and out since admission +9.9 L -Daily weight Filed Weights   12/29/16 0305 12/30/16 0455 12/31/16 0603  Weight: 224 lb 3.3 oz (101.7 kg) 224 lb 13.9 oz (102 kg) 229 lb 8 oz (104.1 kg)  -BP slightly improved: Will not tolerate Brilinta -continued extravascular fluid overload, will not tolerate diuresis   -Continue ASA   Pulmonary HTN -see ischemic cardiomyopathy  CAD -S/P DES placed 02/2016 -See ischemic cardiomyopathy  Paroxysmal atrial fibrillation -Currently in NSR  Acute renal failure    Recent Labs Lab 12/26/16 1433 12/27/16 1136 12/28/16 0217 12/29/16 0242 12/30/16 0209 12/31/16 0402  CREATININE 0.85 0.88 0.85 0.79 0.79 0.73  Resolved  Dysphagia -9/21 patient failed swallow study continue tube feeds. -924 patient failed FEES. Discussed at length with daughter the need to make decision on future actions concerning PEG tube. Per daughter age and has living will stating no extreme measures to be taken to save her life. -Counseled given patient's findings unlikely to regain ability to swallow effectively in the short term.  Moderate protein calorie malnutrition -Continue tube feeds  Acute metabolic encephalopathy. -Multifactorial septic shock, elevated ammonia,  metabolic encephalopathy, hypernatremia, mild uremia --CT head negative for acute infarct see results below, Echocardiogram negative acute change to explain AMS see results below -Worsening mental status.: Per RN staff family to have goals of care meeting with PALLIATIVE CARE on 9/28. Have already make patient DO NOT RESUSCITATE   Hyperglycemia -9/9 Hemoglobin A1c = 5.8, does not meet criteria for prediabetes/diabetes -lantus 10 units daily -resistant SSI  Hypokalemia -Potassium goal> 4   Hypernatremia -Free water 5x per day  -D5W 92ml/hr  BPH -Flomax 0.4 mg daily  Oral candidiasis -Diflucan per pharmacy -Magic mouthwash   Goals of care 9/27 PALLIATIVE CARE: family has made patient DO NOT RESUSCITATE. Per RN [facility] palliative care follow-up being scheduled for 9/28 to discuss transitioning to comfort care.   DVT prophylaxis: SCD Code Status: DNR Family Communication: Daughter present at bedside for discussion of plan care  Disposition Plan: TBD   Consultants:  IR [facility] Orthopedic surgery GI Dr. Willis Modena   Procedures/Significant Events:  8/30 Admit 9/4 S/P LEFT intertrochantric intramedullary nailing  9/4 intubated 9/04  Surgery/ tx to ICU for hypotension 9/6 extubated 9/8 abdominal ultrasound: Negative gallstones, slight sludge in the gallbladder and there is slight diffuse thickening of the gallbladder wall. Negative sonographic Murphy's sign. 9/10: GI, surgical service and IR consulted  9/11 cholecystostomy tube placed 9/21 IR capped percutaneous cholecystostomy drain 9/25 CT head WO contrast negative acute infarct.-Severe atherosclerosis carotid siphons, otherwise negative 9/26 PCXR; Asbestos related pleural disease with areas of asbestosis on prior chest CT. No acute finding  9/26 Echocardiogram:Left ventricle: Septal apical and inferior wall hypokinesis.  -LVEF= 40% to 45%.-Pulmonary arteries: PA peak pressure: 46 mm Hg (S).   I have personally  reviewed and interpreted all radiology studies and my findings are as above.  VENTILATOR SETTINGS:    Cultures 9/4 BC >> negative 9/5 urine  >> positive E coli 9/11 blood culture>>>neg 9/11 perc drain negative 9/12 c diff antigen positive, toxigenic pcr positive 9/22 blood pending  9/22 urine pending     Antimicrobials:Anti-infectives    Start     Stop   12/27/16 1700  fluconazole (DIFLUCAN) IVPB 400 mg         12/18/16 0000  vancomycin (VANCOCIN) 50 mg/mL oral solution 500 mg     12/31/16 0904   12/17/16 1800  vancomycin (VANCOCIN) 50 mg/mL oral solution 500 mg  Status:  Discontinued     12/17/16 2333   12/16/16 1800  vancomycin (VANCOCIN) 50 mg/mL oral solution 125 mg  Status:  Discontinued     12/17/16 1509   12/16/16 1800  cefTAZidime (FORTAZ) 2 g in dextrose 5 % 50 mL IVPB  Status:  Discontinued     12/21/16 1323   12/14/16 1215  piperacillin-tazobactam (ZOSYN) IVPB 3.375 g  Status:  Discontinued     12/16/16 1740   12/12/16 1200  ampicillin (OMNIPEN) 1 g in sodium chloride 0.9 % 50 mL IVPB  Status:  Discontinued     12/14/16 1127   12/09/16 0100  ceFAZolin (ANCEF) IVPB 1 g/50 mL premix     12/09/16 1557   12/08/16 2300  cefTRIAXone (ROCEPHIN) 1 g in dextrose 5 % 50 mL IVPB  Status:  Discontinued     12/12/16 1002   12/04/16 0600  ceFAZolin (ANCEF) IVPB 2g/100 mL premix     12/05/16 0559       Devices    LINES / TUBES:  Left Radial  Aline 9/4 >>9/12 R TL IJ CVC 9/4  >>  9/20 ETT   9/4 >> 9/6 Perc GB drain 9/12>> capped on 9/21     Continuous Infusions: . dextrose 75 mL/hr at 12/31/16 0300  . feeding supplement (VITAL AF 1.2 CAL) 1,000 mL (12/31/16 0555)  . fluconazole (DIFLUCAN) IV Stopped (12/30/16 1920)     Objective: Vitals:   12/30/16 1937 12/30/16 2315 12/31/16 0319 12/31/16 0603  BP: 114/61 111/63 107/66   Pulse: 66 69 71   Resp: Temp: 97.6 F (36.4 C) 97.9 F (36.6 C) 97.8 F (36.6 C)   TempSrc: Axillary Axillary Axillary    SpO2: 96% 97% 97%   Weight:    229 lb 8 oz (104.1 kg)  Height:        Intake/Output Summary (Last 24 hours) at 12/31/16 0731 Last data filed at 12/31/16 0555  Gross per 24 hour  Intake          4543.75 ml  Output             1400 ml  Net          3143.75 ml   Filed Weights   12/29/16 0305 12/30/16 0455 12/31/16 0603  Weight: 224 lb 3.3 oz (101.7 kg) 224 lb 13.9 oz (102 kg) 229 lb 8 oz (104.1 kg)   Physical Exam:  General: obtunded, moans slightly to painful stimuli, does not follow commands,No acute respiratory distress Eyes: negative scleral hemorrhage, negative anisocoria, negative icterus Neck:  Negative scars, masses, torticollis, lymphadenopathy, JVD Lungs: Clear to auscultation bilaterally without wheezes or crackles Cardiovascular: Regular rate and rhythm without murmur gallop or rub normal S1 and S2 Abdomen: negative abdominal pain, nondistended, positive soft, bowel sounds, no rebound, no ascites, no appreciable mass Extremities: No significant cyanosis, clubbing, or edema bilateral lower extremities Skin: Negative rashes, lesions, ulcers Psychiatric:  unable to evaluate secondary to AMSNegative depression, negative anxiety, negative fatigue, negative mania  Central nervous system:  Unable to evaluate secondary to AMS    Data Reviewed: Care during the described time interval was provided by me .  I have reviewed this patient's available data, including medical history, events of note, physical examination, and all test results as part of my evaluation.   CBC:  Recent Labs Lab 12/25/16 0156 12/26/16 1116 12/29/16 0440  WBC 10.2 12.7* 11.6*  NEUTROABS 9.1*  --   --   HGB 9.7* 10.3* 10.7*  HCT 31.3* 33.1* 35.3*  MCV 99.7 103.4* 106.6*  PLT 329 325 208   Basic Metabolic Panel:  Recent Labs Lab 12/24/16 1940  12/25/16 0156 12/26/16 0713  12/27/16 1136 12/28/16 0217 12/29/16 0242 12/30/16 0209 12/31/16 0402  NA  --   < > 145 147*  < > 153* 151* 150*  147* 143  K 3.5  --  3.4* 4.3  < > 4.5 4.2 3.3* 3.7 4.4  CL  --   < > 115* 119*  < > 123* 119* 119* 116* 112*  CO2  --   < > 22 23  < > GLUCOSE  --   < > 215* 180*  < > 194* 198* 153* 158* 129*  BUN  --   < > 77* 70*  < > 68* 66* 67* 60* 54*  CREATININE  --   < > 1.02 0.86  < > 0.88 0.85 0.79 0.79 0.73  CALCIUM  --   < > 7.8* 7.9*  < >  8.1* 8.0* 7.9* 7.7* 7.5*  MG 2.2  --  2.2 2.2  --  2.3 2.1  --   --   --   < > = values in this interval not displayed. GFR: Estimated Creatinine Clearance: 88.7 mL/min (by C-G formula based on SCr of 0.73 mg/dL). Liver Function Tests:  Recent Labs Lab 12/26/16 1433 12/28/16 0217 12/29/16 0242 12/30/16 0209 12/31/16 0402  AST 77* 89* 80* 90* 94*  ALT 120* 113* 126* 135* 129*  ALKPHOS 127* 121 137* 166* 179*  BILITOT 7.1* 6.6* 6.2* 6.2* 5.1*  PROT 5.1* 5.2* 5.1* 5.2* 4.6*  ALBUMIN 1.7* 2.4* 2.3* 2.2* 2.0*   No results for input(s): LIPASE, AMYLASE in the last 168 hours.  Recent Labs Lab 12/26/16 1433 12/30/16 0209  AMMONIA 30 24   Coagulation Profile: No results for input(s): INR, PROTIME in the last 168 hours. Cardiac Enzymes: No results for input(s): CKTOTAL, CKMB, CKMBINDEX, TROPONINI in the last 168 hours. BNP (last 3 results) No results for input(s): PROBNP in the last 8760 hours. HbA1C: No results for input(s): HGBA1C in the last 72 hours. CBG:  Recent Labs Lab 12/30/16 1614 12/30/16 1749 12/30/16 1939 12/30/16 2332 12/31/16 0322  GLUCAP 67 82 111* 138* 138*   Lipid Profile: No results for input(s): CHOL, HDL, LDLCALC, TRIG, CHOLHDL, LDLDIRECT in the last 72 hours. Thyroid Function Tests:  Recent Labs  12/29/16 0242  TSH 1.765   Anemia Panel:  Recent Labs  12/29/16 0242 12/29/16 0440  VITAMINB12 3,868*  --   FOLATE 23.5  --   FERRITIN 472*  --   TIBC 195*  --   IRON 73  --   RETICCTPCT  --  6.9*   Urine analysis:    Component Value Date/Time   COLORURINE YELLOW 12/08/2016 2015    APPEARANCEUR TURBID (A) 12/08/2016 2015   LABSPEC 1.012 12/08/2016 2015   PHURINE 5.0 12/08/2016 2015   GLUCOSEU NEGATIVE 12/08/2016 2015   HGBUR LARGE (A) 12/08/2016 2015   BILIRUBINUR NEGATIVE 12/08/2016 2015   KETONESUR NEGATIVE 12/08/2016 2015   PROTEINUR 100 (A) 12/08/2016 2015   UROBILINOGEN 1.0 09/20/2013 0851   NITRITE NEGATIVE 12/08/2016 2015   LEUKOCYTESUR MODERATE (A) 12/08/2016 2015   Sepsis Labs: @LABRCNTIP (procalcitonin:4,lacticidven:4)  ) Recent Results (from the past 240 hour(s))  Culture, blood (routine x 2)     Status: None (Preliminary result)   Collection Time: 12/26/16  2:30 PM  Result Value Ref Range Status   Specimen Description BLOOD RIGHT ARM  Final   Special Requests IN PEDIATRIC BOTTLE Blood Culture adequate volume  Final   Culture NO GROWTH 4 DAYS  Final   Report Status PENDING  Incomplete  Culture, blood (routine x 2)     Status: None (Preliminary result)   Collection Time: 12/26/16  2:38 PM  Result Value Ref Range Status   Specimen Description BLOOD LEFT HAND  Final   Special Requests   Final    BOTTLES DRAWN AEROBIC ONLY Blood Culture adequate volume   Culture NO GROWTH 4 DAYS  Final   Report Status PENDING  Incomplete  Culture, Urine     Status: None   Collection Time: 12/26/16  6:19 PM  Result Value Ref Range Status   Specimen Description URINE, RANDOM  Final   Special Requests NONE  Final   Culture NO GROWTH  Final   Report Status 12/27/2016 FINAL  Final         Radiology Studies: Ct Head Wo Contrast  Result Date:  12/29/2016 CLINICAL DATA:  LEFT level consciousness. Recent hip surgery in urinary tract infection. EXAM: CT HEAD WITHOUT CONTRAST TECHNIQUE: Contiguous axial images were obtained from the base of the skull through the vertex without intravenous contrast. COMPARISON:  CT HEAD October 14, 2016 FINDINGS: BRAIN: No intraparenchymal hemorrhage, mass effect nor midline shift. The ventricles and sulci are normal for age. Patchy  supratentorial white matter hypodensities less than expected for patient's age, though non-specific are most compatible with chronic small vessel ischemic disease. No acute large vascular territory infarcts. No abnormal extra-axial fluid collections. Basal cisterns are patent. VASCULAR: Severe calcific atherosclerosis of the carotid siphons. SKULL: No skull fracture. No significant scalp soft tissue swelling. SINUSES/ORBITS: Lobulated mucosal thickening paranasal sinuses with chronic chronic bony remodeling maxillary and sphenoid sinuses compatible with chronic sinusitis. Reviewed The included ocular globes and orbital contents are non-suspicious. Status post bilateral ocular lens implants. Nasogastric tube via LEFT nares. OTHER: Patient is edentulous. IMPRESSION: 1. No acute intracranial process. 2. Severe atherosclerosis carotid siphons, otherwise negative noncontrast CT HEAD for age. Electronically Signed   By: Awilda Metro M.D.   On: 12/29/2016 19:26   Dg Chest Port 1 View  Result Date: 12/30/2016 CLINICAL DATA:  Pneumonia with weakness EXAM: PORTABLE CHEST 1 VIEW COMPARISON:  12/21/2016 FINDINGS: There is extensive calcified pleural plaques bilaterally on April 2018 chest CT there is also subpleural lines and round atelectasis. Lung volumes are low. Stable borderline heart size with aortic tortuosity. A feeding tube at least reaches the stomach. No Kerley lines, air bronchograms, or pneumothorax. 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 No acute finding when compared to prior. Superimposed pneumonia could easily be obscured. Electronically Signed   By: Marnee Spring M.D.   On: 12/30/2016 07:39        Scheduled Meds: . aspirin  81 mg Per Tube Daily  . chlorhexidine  15 mL Mouth Rinse BID  . Chlorhexidine Gluconate Cloth  6 each Topical Daily  . free water  400 mL Per Tube 5 X Daily  . insulin aspart  0-20 Units Subcutaneous Q4H  . insulin  glargine  10 Units Subcutaneous QHS  . lipase/protease/amylase  36,000 Units Oral Once   And  . sodium bicarbonate  650 mg Oral Once  . magic mouthwash  5 mL Oral TID  . mouth rinse  15 mL Mouth Rinse q12n4p  . midodrine  10 mg Per Tube TID WC  . OLANZapine  5 mg Per Tube QHS  . tamsulosin  0.4 mg Oral Daily   Continuous Infusions: . dextrose 75 mL/hr at 12/31/16 0300  . feeding supplement (VITAL AF 1.2 CAL) 1,000 mL (12/31/16 0555)  . fluconazole (DIFLUCAN) IV Stopped (12/30/16 1920)     LOS: 28 days    Time spent: 40 minutes    WOODS, Roselind Messier, MD [facility] Pager [phone]   If 7PM-7AM, please contact night-coverage [email or web address] Password [name] 12/31/2016, 7:31 AM

## 2017-01-01 ENCOUNTER — Ambulatory Visit: Payer: PPO | Admitting: Cardiovascular Disease

## 2017-01-01 DIAGNOSIS — R1310 Dysphagia, unspecified: Secondary | ICD-10-CM

## 2017-01-01 DIAGNOSIS — R601 Generalized edema: Secondary | ICD-10-CM

## 2017-01-01 LAB — GLUCOSE, CAPILLARY
GLUCOSE-CAPILLARY: 100 mg/dL — AB (ref 65–99)
GLUCOSE-CAPILLARY: 89 mg/dL (ref 65–99)
Glucose-Capillary: 112 mg/dL — ABNORMAL HIGH (ref 65–99)
Glucose-Capillary: 120 mg/dL — ABNORMAL HIGH (ref 65–99)
Glucose-Capillary: 131 mg/dL — ABNORMAL HIGH (ref 65–99)
Glucose-Capillary: 58 mg/dL — ABNORMAL LOW (ref 65–99)
Glucose-Capillary: 61 mg/dL — ABNORMAL LOW (ref 65–99)
Glucose-Capillary: 91 mg/dL (ref 65–99)

## 2017-01-01 LAB — BRAIN NATRIURETIC PEPTIDE: B Natriuretic Peptide: 332.1 pg/mL — ABNORMAL HIGH (ref 0.0–100.0)

## 2017-01-01 MED ORDER — MIDODRINE HCL 5 MG PO TABS
10.0000 mg | ORAL_TABLET | Freq: Three times a day (TID) | ORAL | Status: DC
  Administered 2017-01-01 – 2017-01-15 (×40): 10 mg via ORAL
  Filled 2017-01-01 (×41): qty 2

## 2017-01-01 MED ORDER — DEXTROSE-NACL 5-0.45 % IV SOLN
INTRAVENOUS | Status: DC
Start: 2017-01-01 — End: 2017-01-01

## 2017-01-01 MED ORDER — RESOURCE THICKENUP CLEAR PO POWD
ORAL | Status: DC | PRN
  Filled 2017-01-01 (×2): qty 125

## 2017-01-01 MED ORDER — DEXTROSE 50 % IV SOLN
INTRAVENOUS | Status: AC
  Administered 2017-01-01: 25 mL
  Filled 2017-01-01: qty 50

## 2017-01-01 MED ORDER — FUROSEMIDE 20 MG PO TABS
20.0000 mg | ORAL_TABLET | Freq: Every day | ORAL | Status: DC
  Administered 2017-01-01: 20 mg via ORAL
  Filled 2017-01-01: qty 1

## 2017-01-01 MED ORDER — ACETAMINOPHEN 160 MG/5ML PO SOLN
650.0000 mg | Freq: Four times a day (QID) | ORAL | Status: DC | PRN
  Administered 2017-01-07: 650 mg via ORAL
  Filled 2017-01-01 (×2): qty 20.3

## 2017-01-01 MED ORDER — ASPIRIN 81 MG PO CHEW
81.0000 mg | CHEWABLE_TABLET | Freq: Every day | ORAL | Status: DC
  Administered 2017-01-02 – 2017-01-15 (×14): 81 mg via ORAL
  Filled 2017-01-01 (×15): qty 1

## 2017-01-01 MED ORDER — FUROSEMIDE 10 MG/ML IJ SOLN
20.0000 mg | Freq: Every day | INTRAMUSCULAR | Status: DC
  Administered 2017-01-02 – 2017-01-15 (×14): 20 mg via INTRAVENOUS
  Filled 2017-01-01 (×14): qty 2

## 2017-01-01 NOTE — Progress Notes (Signed)
ANTIBIOTIC CONSULT NOTE   Pharmacy Consult for Fluconazole Indication: esophageal candidiasis  Allergies  Allergen Reactions  . Amiodarone Rash  . Morphine And Related Other (See Comments)    Confusion/ hallucinations     Patient Measurements: Height: 6\' 1"  (185.4 cm) Weight: 231 lb 0.7 oz (104.8 kg) IBW/kg (Calculated) : 79.9 Adjusted Body Weight:    Vital Signs: Temp: 98.4 F (36.9 C) (09/28 1200) Temp Source: Axillary (09/28 1200) BP: 92/72 (09/28 1200) Pulse Rate: 68 (09/28 1200) Intake/Output from previous day: 09/27 0701 - 09/28 0700 In: 4830 [I.V.:1875; NG/GT:2550; IV Piggyback:400] Out: 1745 [Urine:1300; Stool:445] Intake/Output from this shift: Total I/O In: 385 [NG/GT:385] Out: 375 [Urine:200; Emesis/NG output:175]  Labs:  Recent Labs  12/30/16 0209 12/31/16 0402 12/31/16 0858  WBC  --   --  11.0*  HGB  --   --  10.3*  PLT  --   --  137*  CREATININE 0.79 0.73  --    Estimated Creatinine Clearance: 89 mL/min (by C-G formula based on SCr of 0.73 mg/dL). No results for input(s): VANCOTROUGH, VANCOPEAK, VANCORANDOM, GENTTROUGH, GENTPEAK, GENTRANDOM, TOBRATROUGH, TOBRAPEAK, TOBRARND, AMIKACINPEAK, AMIKACINTROU, AMIKACIN in the last 72 hours.   Microbiology:   Medical History: Past Medical History:  Diagnosis Date  . Anemia   . Aortic insufficiency 03/09/2016   a. mild-mod AI by echo 03/04/16.  . Arthritis   . Asbestosis (HCC)    "no breathing problems" pt states worked in [employer] for many yrs and was exposed to asbestosis  . Bladder outlet obstruction 03/09/2016  . CAD (coronary artery disease)    a. 02/2016: inf lat STEMI with occluded OM2 which was treated with DES; otherwise he had moderate 50% oRCA, 40% oLM, 70% D1, 10% mLAD, 80% lateral 2nd marg which was treated medically.  . Cardiomyopathy, ischemic   . Chronic combined systolic and diastolic CHF (congestive heart failure) (HCC)    a. EF 30-35% at time of STEMI 02/2016, improved to  40-45% by echo 11/20 (but with pericardial effusion). b. Further improved EF to 55-60% 03/04/16.  Marland Kitchen CKD (chronic kidney disease), stage III   . Coronary artery disease   . Dyspnea   . First degree AV block   . Hip fracture (HCC) 11/2016  . Hyperlipidemia   . Hyponatremia   . Lumbar spondylosis   . Myocardial infarction (HCC)   . Orthostatic hypotension   . PAF (paroxysmal atrial fibrillation) (HCC)    a. dx 02/2016: not anticoagulated due to pericardial effusion; amiodarone stopped due to suspected allergic drug rash.  . Pericardial effusion    a. 02/2016 - followed clinically (mod-large at first then small-mod in f/u echo).    Assessment: ID:  currently #6 Fluconazole for esophaageal candidiasis. Scr 0.73 stable with CrCl 89.  Goal of Therapy:  Eradication of infection  Plan:  Fluconazole 400mg  IV q24h (max dose). Recommend to continue 14d. Pharmacy will sign off. Please reconsult for further dosing assitance.    Agripina Guyette S. Merilynn Finland, PharmD, BCPS Clinical Staff Pharmacist Pager [phone]  Misty Stanley Stillinger 01/01/2017,1:27 PM

## 2017-01-01 NOTE — Procedures (Signed)
Objective Swallowing Evaluation: Type of Study: FEES-Fiberoptic Endoscopic Evaluation of Swallow  Patient Details  Name: Danny Oneill MRN: [id number] Date of Birth: 1931/10/19  Today's Date: 01/01/2017 Time: SLP Start Time (ACUTE ONLY): 0852-SLP Stop Time (ACUTE ONLY): 0950 SLP Time Calculation (min) (ACUTE ONLY): 58 min  Past Medical History:  Past Medical History:  Diagnosis Date  . Anemia   . Aortic insufficiency 03/09/2016   a. mild-mod AI by echo 03/04/16.  . Arthritis   . Asbestosis (HCC)    "no breathing problems" pt states worked in [employer] for many yrs and was exposed to asbestosis  . Bladder outlet obstruction 03/09/2016  . CAD (coronary artery disease)    a. 02/2016: inf lat STEMI with occluded OM2 which was treated with DES; otherwise he had moderate 50% oRCA, 40% oLM, 70% D1, 10% mLAD, 80% lateral 2nd marg which was treated medically.  . Cardiomyopathy, ischemic   . Chronic combined systolic and diastolic CHF (congestive heart failure) (HCC)    a. EF 30-35% at time of STEMI 02/2016, improved to 40-45% by echo 11/20 (but with pericardial effusion). b. Further improved EF to 55-60% 03/04/16.  Marland Kitchen CKD (chronic kidney disease), stage III   . Coronary artery disease   . Dyspnea   . First degree AV block   . Hip fracture (HCC) 11/2016  . Hyperlipidemia   . Hyponatremia   . Lumbar spondylosis   . Myocardial infarction (HCC)   . Orthostatic hypotension   . PAF (paroxysmal atrial fibrillation) (HCC)    a. dx 02/2016: not anticoagulated due to pericardial effusion; amiodarone stopped due to suspected allergic drug rash.  . Pericardial effusion    a. 02/2016 - followed clinically (mod-large at first then small-mod in f/u echo).   Past Surgical History:  Past Surgical History:  Procedure Laterality Date  . CARDIAC CATHETERIZATION N/A 02/02/2016   Procedure: Left Heart Cath and Coronary Angiography;  Surgeon: Corky Crafts, MD;  Location: [facility] INVASIVE CV LAB;  Service:  Cardiovascular;  Laterality: N/A;  . CARDIAC CATHETERIZATION N/A 02/02/2016   Procedure: Coronary Stent Intervention;  Surgeon: Corky Crafts, MD;  Location: [facility] INVASIVE CV LAB;  Service: Cardiovascular;  Laterality: N/A;  . CARDIAC CATHETERIZATION N/A 02/02/2016   Procedure: IABP Insertion;  Surgeon: Corky Crafts, MD;  Location: [facility] INVASIVE CV LAB;  Service: Cardiovascular;  Laterality: N/A;  . CATARACTS REMOVED     BIL  . CORONARY STENT PLACEMENT  01/2016  . INTRAMEDULLARY (IM) NAIL INTERTROCHANTERIC Left 12/08/2016   Procedure: INTRAMEDULLARY (IM) NAIL INTERTROCHANTRIC;  Surgeon: Yolonda Kida, MD;  Location: [facility] OR;  Service: Orthopedics;  Laterality: Left;  . IR PERC CHOLECYSTOSTOMY  12/15/2016  . ROTATOR CUFF REPAIR  2011   L SHOULDER  . TOTAL KNEE ARTHROPLASTY Right 10/03/2013   Procedure: RIGHT TOTAL KNEE ARTHROPLASTY;  Surgeon: [name], MD;  Location: [facility] ORS;  Service: Orthopedics;  Laterality: Right;   HPI: 83 yo male with PMH of HLD, lumbar spondylosis, MI, CAD, PAF, cardiomyopathy, CKD, CHF who was admitted on 8/30 with L hip fracture. He developed hypoxia and hypotension after surgery (L IM nail) on 9/4 and required re-intubation 9/4-9/6. Pt with septic shock from UTI, then found to have c diff, also with acute acaclulous colecystitis s/p perc drain 9/11. SLP ordered for swallow evaluation as pt's alertness began to improve.  Subjective: pt communicative but difficult to understand, does clearly ask for "water"   Assessment / Plan / Recommendation  CHL IP CLINICAL IMPRESSIONS  01/01/2017  Clinical Impression Pt demonstrates dramatic improvement in health of oral and oropharyngeal mucosa since last FEES. Pt also more alert and participatory, able to follow commands to swallow and cough with a delay and max verbal cues. Despite improvement there is still moderate generalized weakness throughout swallowing mechanism and delay in swallow initiation that leads to  spillage to pharynx with liquids prior to airway protection and severe residuals with puree and significantly thick textures. There is severe pooling in the valleculae and lateral channels that spills to the vestibule post swallow. Incomplete airway closure during the swallow suspected with probable trace sensed and silent aspiration events. Strong delayed coughing observed with green tinged mucous.  Best texture for pt control of bolus with least residuals and aspiration events is nectar thick liquids via cup. Will plan to advance to pureed solids as pt become more accostomed to PO intake. Three out of five family members were present for this test and were in agreement to start nectar thick liquids with known risk of aspiration. Encouraged oral care to reduce bacterial load and will initaite attempts at strengthening exercises per their wishes. Pt was stimulable for a chin tuck which reduced residuals and cued cough to expectorate penetrate/aspirate. This can be trialed in therapy with puree textures. Will consider chin tuck against resistance and effortful swallows as well.   SLP Visit Diagnosis Dysphagia, oropharyngeal phase (R13.12)  Attention and concentration deficit following --  Frontal lobe and executive function deficit following --  Impact on safety and function Severe aspiration risk      CHL IP TREATMENT RECOMMENDATION 01/01/2017  Treatment Recommendations Therapy as outlined in treatment plan below     Prognosis 01/01/2017  Prognosis for Safe Diet Advancement Good  Barriers to Reach Goals Cognitive deficits  Barriers/Prognosis Comment --    CHL IP DIET RECOMMENDATION 01/01/2017  SLP Diet Recommendations Nectar thick liquid  Liquid Administration via Cup  Medication Administration Crushed with puree  Compensations Slow rate;Small sips/bites;Minimize environmental distractions;Effortful swallow;Chin tuck;Clear throat intermittently;Hard cough after swallow  Postural Changes Remain  semi-upright after after feeds/meals (Comment);Seated upright at 90 degrees      CHL IP OTHER RECOMMENDATIONS 01/01/2017  Recommended Consults --  Oral Care Recommendations Oral care QID  Other Recommendations Have oral suction available;Order thickener from pharmacy      CHL IP FOLLOW UP RECOMMENDATIONS 01/01/2017  Follow up Recommendations Skilled Nursing facility      [facility] IP FREQUENCY AND DURATION 01/01/2017  Speech Therapy Frequency (ACUTE ONLY) min 2x/week  Treatment Duration 2 weeks           CHL IP ORAL PHASE 01/01/2017  Oral Phase Impaired  Oral - Pudding Teaspoon --  Oral - Pudding Cup --  Oral - Honey Teaspoon --  Oral - Honey Cup --  Oral - Nectar Teaspoon --  Oral - Nectar Cup Delayed oral transit  Oral - Nectar Straw --  Oral - Thin Teaspoon NT  Oral - Thin Cup --  Oral - Thin Straw --  Oral - Puree Delayed oral transit  Oral - Mech Soft --  Oral - Regular --  Oral - Multi-Consistency --  Oral - Pill --  Oral Phase - Comment --    CHL IP PHARYNGEAL PHASE 01/01/2017  Pharyngeal Phase Impaired  Pharyngeal- Pudding Teaspoon --  Pharyngeal --  Pharyngeal- Pudding Cup --  Pharyngeal --  Pharyngeal- Honey Teaspoon --  Pharyngeal --  Pharyngeal- Honey Cup --  Pharyngeal --  Pharyngeal- Nectar Teaspoon Reduced pharyngeal  peristalsis;Reduced anterior laryngeal mobility;Delayed swallow initiation-vallecula;Reduced laryngeal elevation;Reduced airway/laryngeal closure;Reduced tongue base retraction;Penetration/Aspiration during swallow;Penetration/Apiration after swallow;Trace aspiration;Pharyngeal residue - valleculae;Lateral channel residue;Inter-arytenoid space residue;Compensatory strategies attempted (with notebox)  Pharyngeal Material enters airway, CONTACTS cords and then ejected out;Material enters airway, passes BELOW cords and not ejected out despite cough attempt by patient;Material enters airway, passes BELOW cords then ejected out  Pharyngeal- Nectar Cup  Reduced pharyngeal peristalsis;Reduced anterior laryngeal mobility;Delayed swallow initiation-vallecula;Reduced laryngeal elevation;Reduced airway/laryngeal closure;Reduced tongue base retraction;Penetration/Aspiration during swallow;Penetration/Apiration after swallow;Trace aspiration;Pharyngeal residue - valleculae;Lateral channel residue;Inter-arytenoid space residue;Compensatory strategies attempted (with notebox)  Pharyngeal Material enters airway, CONTACTS cords and then ejected out;Material enters airway, passes BELOW cords and not ejected out despite cough attempt by patient;Material enters airway, passes BELOW cords then ejected out  Pharyngeal- Nectar Straw --  Pharyngeal --  Pharyngeal- Thin Teaspoon NT  Pharyngeal --  Pharyngeal- Thin Cup --  Pharyngeal --  Pharyngeal- Thin Straw --  Pharyngeal --  Pharyngeal- Puree Reduced pharyngeal peristalsis;Reduced anterior laryngeal mobility;Delayed swallow initiation-vallecula;Reduced laryngeal elevation;Reduced airway/laryngeal closure;Reduced tongue base retraction;Pharyngeal residue - valleculae;Lateral channel residue;Compensatory strategies attempted (with notebox)  Pharyngeal Material enters airway, remains ABOVE vocal cords then ejected out  Pharyngeal- Mechanical Soft --  Pharyngeal --  Pharyngeal- Regular --  Pharyngeal --  Pharyngeal- Multi-consistency --  Pharyngeal --  Pharyngeal- Pill --  Pharyngeal --  Pharyngeal Comment --     CHL IP CERVICAL ESOPHAGEAL PHASE 01/01/2017  Cervical Esophageal Phase WFL  Pudding Teaspoon --  Pudding Cup --  Honey Teaspoon --  Honey Cup --  Nectar Teaspoon --  Nectar Cup --  Nectar Straw --  Thin Teaspoon --  Thin Cup --  Thin Straw --  Puree --  Mechanical Soft --  Regular --  Multi-consistency --  Pill --  Cervical Esophageal Comment --    No flowsheet data found. Harlon Ditty, MA CCC-SLP [phone]  Claudine Mouton 01/01/2017, 10:13 AM

## 2017-01-01 NOTE — Consult Note (Signed)
patient has been seen in conjunction with Suzzette Righter, NP-C. All aspects of care have been considered and discussed. The patient has been personally interviewed, examined, and all clinical data has been reviewed.   Chronically ill 83 year old gentleman with history of acute myocardial infarction and associated cardiogenic shock  October 2017,history of PAF,CKD stage III, bladder outlet obstruction, asbestosis,chronic combined systolic and diastolic heart failure with EF 45%,hepatic dysfunction, in whom we are being asked to evaluate a generalized edema in this state.  Vital signs are stable. O2 saturation greater than 90%. Sinus rhythm is noted on telemetryThe patient is lethargic and not able to give a history. Son is at the bedside. Neck veins did not appear distended. Arms and legs and abdominal wall or edema. Chest is clear anteriorly. Decreased breath sounds are heard at the bases. Cardiac exam reveals no gallop.  Azotemia is noted on laboratory data with BUN 50, creatinine 0.9, albumin 2.0, hepatic enzymes are mildly to moderately elevated.hemoglobin is 10.3.  The patient's generalized edematous state is related to low oncotic pressure. He does not appear to be in heart failure and there is no clinical evidence of pulmonary congestion.  Recommend checking BNP, continue nutrition, consider switching his standard/chronic Lasix to IV rather than oral dosing as edema may be preventing absorption. Follow kidney function closely.    Cardiology Consultation:   Patient ID: Danny Oneill; [id number]; 1931-12-04   Admit date: 12/03/2016 Date of Consult: 01/01/2017  Primary Care Provider: Geoffry Paradise, MD Primary Cardiologist: Dr. Tresa Endo  Primary Electrophysiologist:  n/a   Patient Profile:   Danny Oneill is a 83 y.o. male with a hx of  CAD s/p inferolateral STEMI in 01/2016, chronic systolic CHF (Echo 12/2016 EF 40-45%), PAF, HLD, bladder outlet obstruction, and CKD stage III who is being  seen today for the evaluation of acute on chronic systolic CHF at the request of Dr. Sharon Seller.   History of Present Illness:   [name] is an 83 year old male with a past medical history of CAD s/p inferolateral STEMI in 01/2016, chronic systolic CHF (Echo 12/2016 EF 40-45%), PAF, HLD, bladder outlet obstruction, and CKD stage III.   He was admitted on 12/03/16 with a mechanical fall that resulted in a left hip fracture. He underwent left interotrochanteric intramedullary nailing on 12/08/16. Post op course has been complicated by urosepsis and C diff. Also had post op hypotension and was transferred to the ICU. Also found to have slight sludge in his gallbladder with significant thickening of the gallbladder wall. A cholecystostomy drain was placed.   His family requested that Cardiology see the patient as the family is concerned about the patient's edema.   [name] is minimally responsive, his son is at the bedside. He is concerned about his dad's lower extremity edema. Mr. Sawyer does not appear SOB at rest. CXR from 12/30/16 shows asbestos related pleural disease. No overt pulmonary edema. His weight is elevated from admission 193 pound was admission weight. His albumin is currently 2.0, has been as low as 1.3 this admission. He has been started on midodrine for his BP.   Past Medical History:  Diagnosis Date  . Anemia   . Aortic insufficiency 03/09/2016   a. mild-mod AI by echo 03/04/16.  . Arthritis   . Asbestosis (HCC)    "no breathing problems" pt states worked in [employer] for many yrs and was exposed to asbestosis  . Bladder outlet obstruction 03/09/2016  . CAD (coronary artery disease)  a. 02/2016: inf lat STEMI with occluded OM2 which was treated with DES; otherwise he had moderate 50% oRCA, 40% oLM, 70% D1, 10% mLAD, 80% lateral 2nd marg which was treated medically.  . Cardiomyopathy, ischemic   . Chronic combined systolic and diastolic CHF (congestive heart failure) (HCC)     a. EF 30-35% at time of STEMI 02/2016, improved to 40-45% by echo 11/20 (but with pericardial effusion). b. Further improved EF to 55-60% 03/04/16.  Marland Kitchen CKD (chronic kidney disease), stage III   . Coronary artery disease   . Dyspnea   . First degree AV block   . Hip fracture (HCC) 11/2016  . Hyperlipidemia   . Hyponatremia   . Lumbar spondylosis   . Myocardial infarction (HCC)   . Orthostatic hypotension   . PAF (paroxysmal atrial fibrillation) (HCC)    a. dx 02/2016: not anticoagulated due to pericardial effusion; amiodarone stopped due to suspected allergic drug rash.  . Pericardial effusion    a. 02/2016 - followed clinically (mod-large at first then small-mod in f/u echo).    Past Surgical History:  Procedure Laterality Date  . CARDIAC CATHETERIZATION N/A 02/02/2016   Procedure: Left Heart Cath and Coronary Angiography;  Surgeon: Corky Crafts, MD;  Location[facility] INVASIVE CV LAB;  Service: Cardiovascular;  Laterality: N/A;  . CARDIAC CATHETERIZATION N/A 02/02/2016   Procedure: Coronary Stent Intervention;  Surgeon: Corky Crafts, MD;  Location: [facility] INVASIVE CV LAB;  Service: Cardiovascular;  Laterality: N/A;  . CARDIAC CATHETERIZATION N/A 02/02/2016   Procedure: IABP Insertion;  Surgeon: Corky Crafts, MD;  Location: [facility] INVASIVE CV LAB;  Service: Cardiovascular;  Laterality: N/A;  . CATARACTS REMOVED     BIL  . CORONARY STENT PLACEMENT  01/2016  . INTRAMEDULLARY (IM) NAIL INTERTROCHANTERIC Left 12/08/2016   Procedure: INTRAMEDULLARY (IM) NAIL INTERTROCHANTRIC;  Surgeon: Yolonda Kida, MD;  Location: [facility] OR;  Service: Orthopedics;  Laterality: Left;  . IR PERC CHOLECYSTOSTOMY  12/15/2016  . ROTATOR CUFF REPAIR  2011   L SHOULDER  . TOTAL KNEE ARTHROPLASTY Right 10/03/2013   Procedure: RIGHT TOTAL KNEE ARTHROPLASTY;  Surgeon: [name], MD;  Location: [facility] ORS;  Service: Orthopedics;  Laterality: Right;       Inpatient Medications: Scheduled Meds: .  [START ON 01/02/2017] aspirin  81 mg Oral Daily  . chlorhexidine  15 mL Mouth Rinse BID  . Chlorhexidine Gluconate Cloth  6 each Topical Daily  . furosemide  20 mg Oral Daily  . insulin aspart  0-20 Units Subcutaneous Q4H  . magic mouthwash  5 mL Oral TID  . mouth rinse  15 mL Mouth Rinse q12n4p  . midodrine  10 mg Oral TID WC  . OLANZapine  5 mg Per Tube QHS  . tamsulosin  0.4 mg Oral Daily   Continuous Infusions: . dextrose 85 mL/hr at 01/01/17 1330  . fluconazole (DIFLUCAN) IV Stopped (12/31/16 1837)   PRN Meds: acetaminophen (TYLENOL) oral liquid 160 mg/5 mL, ondansetron, RESOURCE THICKENUP CLEAR  Allergies:    Allergies  Allergen Reactions  . Amiodarone Rash  . Morphine And Related Other (See Comments)    Confusion/ hallucinations     Social History:   Social History   Social History  . Marital status: Married    Spouse name: N/A  . Number of children: N/A  . Years of education: N/A   Occupational History  . Not on file.   Social History Main Topics  . Smoking status: Never Smoker  .  Smokeless tobacco: Never Used  . Alcohol use No  . Drug use: No  . Sexual activity: Not on file   Other Topics Concern  . Not on file   Social History Narrative  . No narrative on file    Family History:    Family History  Problem Relation Age of Onset  . Cancer Mother      ROS:  Please see the history of present illness.  ROS  All other ROS reviewed and negative.     Physical Exam/Data:   Vitals:   01/01/17 0500 01/01/17 0731 01/01/17 1200 01/01/17 1600  BP:  102/62 92/72 113/73  Pulse:  62 68 61  Resp:  18 (!) 27 14  Temp:  97.6 F (36.4 C) 98.4 F (36.9 C) 97.9 F (36.6 C)  TempSrc:  Axillary Axillary Axillary  SpO2:  96% 98% 99%  Weight: 231 lb 0.7 oz (104.8 kg)     Height:        Intake/Output Summary (Last 24 hours) at 01/01/17 1722 Last data filed at 01/01/17 1617  Gross per 24 hour  Intake             5270 ml  Output             1445 ml  Net              3825 ml   Filed Weights   12/30/16 0455 12/31/16 0603 01/01/17 0500  Weight: 224 lb 13.9 oz (102 kg) 229 lb 8 oz (104.1 kg) 231 lb 0.7 oz (104.8 kg)   Body mass index is 30.48 kg/m.  General:  Elderly, ill appearing male.  NAD HEENT: normal Lymph: no adenopathy Neck: 8-9 cm JVD Endocrine:  No thryomegaly Vascular: No carotid bruits; FA pulses 2+ bilaterally without bruits  Cardiac:  normal S1, S2; RRR; no murmur  Lungs:  clear to auscultation bilaterally, no wheezing, rhonchi or rales  Abd: soft, nontender, no hepatomegaly  Ext: 2+ pretibial edema bilaterally.  Musculoskeletal:  No deformities, BUE and BLE strength normal and equal. S/p L hip fx repair.  Skin: warm and dry  Neuro:  CNs 2-12 intact, no focal abnormalities noted Psych:  Normal affect   EKG:  The EKG was personally reviewed and demonstrates: 12/10/16:  Sinus tach, low voltage QRS.  Telemetry:  Telemetry was personally reviewed and demonstrates:  NSR   Relevant CV Studies: Transthoracic Echocardiography Study Conclusions  - Left ventricle: Septal apical and inferior wall hypokinesis. The   cavity size was mildly dilated. Wall thickness was normal.   Systolic function was mildly to moderately reduced. The estimated   ejection fraction was in the range of 40% to 45%. - Aortic valve: There was mild regurgitation. - Mitral valve: There was mild regurgitation. Valve area by   pressure half-time: 1.4 cm^2. - Atrial septum: No defect or patent foramen ovale was identified. - Pulmonary arteries: PA peak pressure: 46 mm Hg (S).  Laboratory Data:  Chemistry Recent Labs Lab 12/29/16 0242 12/30/16 0209 12/31/16 0402  NA 150* 147* 143  K 3.3* 3.7 4.4  CL 119* 116* 112*  CO2 GLUCOSE 153* 158* 129*  BUN 67* 60* 54*  CREATININE 0.79 0.79 0.73  CALCIUM 7.9* 7.7* 7.5*  GFRNONAA >60 >60 >60  GFRAA >60 >60 >60  ANIONGAP Recent Labs Lab 12/29/16 0242 12/30/16 0209 12/31/16 0402    PROT 5.1* 5.2* 4.6*  ALBUMIN 2.3* 2.2* 2.0*  AST 80* 90* 94*  ALT 126* 135* 129*  ALKPHOS 137* 166* 179*  BILITOT 6.2* 6.2* 5.1*   Hematology Recent Labs Lab 12/26/16 1116 12/29/16 0440 12/31/16 0858  WBC 12.7* 11.6* 11.0*  RBC 3.20* 3.31*  3.31* 3.18*  HGB 10.3* 10.7* 10.3*  HCT 33.1* 35.3* 34.1*  MCV 103.4* 106.6* 107.2*  MCH 32.2 32.3 32.4  MCHC 31.1 30.3 30.2  RDW 28.7* 28.9* 27.3*  PLT 325 208 137*    Radiology/Studies:  Ct Head Wo Contrast  Result Date: 12/29/2016 CLINICAL DATA:  LEFT level consciousness. Recent hip surgery in urinary tract infection. EXAM: CT HEAD WITHOUT CONTRAST TECHNIQUE: Contiguous axial images were obtained from the base of the skull through the vertex without intravenous contrast. COMPARISON:  CT HEAD October 14, 2016 FINDINGS: BRAIN: No intraparenchymal hemorrhage, mass effect nor midline shift. The ventricles and sulci are normal for age. Patchy supratentorial white matter hypodensities less than expected for patient's age, though non-specific are most compatible with chronic small vessel ischemic disease. No acute large vascular territory infarcts. No abnormal extra-axial fluid collections. Basal cisterns are patent. VASCULAR: Severe calcific atherosclerosis of the carotid siphons. SKULL: No skull fracture. No significant scalp soft tissue swelling. SINUSES/ORBITS: Lobulated mucosal thickening paranasal sinuses with chronic chronic bony remodeling maxillary and sphenoid sinuses compatible with chronic sinusitis. Reviewed The included ocular globes and orbital contents are non-suspicious. Status post bilateral ocular lens implants. Nasogastric tube via LEFT nares. OTHER: Patient is edentulous. IMPRESSION: 1. No acute intracranial process. 2. Severe atherosclerosis carotid siphons, otherwise negative noncontrast CT HEAD for age. Electronically Signed   By: Awilda Metro M.D.   On: 12/29/2016 19:26   Dg Chest Port 1 View  Result Date:  12/30/2016 CLINICAL DATA:  Pneumonia with weakness EXAM: PORTABLE CHEST 1 VIEW COMPARISON:  12/21/2016 FINDINGS: There is extensive calcified pleural plaques bilaterally on April 2018 chest CT there is also subpleural lines and round atelectasis. Lung volumes are low. Stable borderline heart size with aortic tortuosity. A feeding tube at least reaches the stomach. No Kerley lines, air bronchograms, or pneumothorax. 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 No acute finding when compared to prior. Superimposed pneumonia could easily be obscured. Electronically Signed   By: Marnee Spring M.D.   On: 12/30/2016 07:39   Dg Hip Port Unilat With Pelvis 1v Left  Result Date: 12/29/2016 CLINICAL DATA:  Left hip fracture EXAM: DG HIP (WITH OR WITHOUT PELVIS) 1V PORT LEFT COMPARISON:  Fluoroscopy 12/08/2016 FINDINGS: Status post ORIF of an intertrochanteric left femur fracture. A lesser trochanteric fracture fragment appears more distracted. Opaque structure over the pelvis is likely a rectal tube. Distorted pelvic ring due to rotation. No evidence of fracture. IMPRESSION: Recent left intertrochanteric femur fracture and ORIF. Greater distraction of the lesser trochanteric fragment compared to fluoroscopy 12/08/2016. The hardware is in stable position. Electronically Signed   By: Marnee Spring M.D.   On: 12/29/2016 07:45    Assessment and Plan:    1. Acute on chronic systolic CHF: Patient with a history of ICM, s/p inferolateral STEMI almost a year ago with DES to 2nd marginal. Now with edema after treatment for urosepsis, cdiff. With low albumin, likely cause for edema.  - Place Unna boots.  - hesitant to aggressively diurese as patient is not SOB or hypoxic. Will give lasix 20 mg IV instead of po.  - BNP now.   2. S/p Left interotrochanteric intramedullary nailing: stable.   3. C diff -  per PCP.   4. Acute Acalculous Colecystitis  - Abdominal US noted  gallbladder sludge/diffuse thickening - +Murphy sign on exam - 9/11 S/P percutaneous cholecystostomy drain > capped 9/21 w/ IR directing care of this issue       For questions or updates, please contact [facility] Please consult [email or web address] for contact info under Cardiology/STEMI.   Signed, Little Ishikawa, NP  01/01/2017 5:22 PM

## 2017-01-01 NOTE — Progress Notes (Signed)
Physical Therapy Treatment Patient Details Name: Danny Oneill MRN: [id number] DOB: [birth year] Today's Date: 01/01/2017    History of Present Illness 83 yo male with PMH of HLD, lumbar spondylosis, MI, CAD, PAF, cardiomyopathy, CKD, CHF who was admitted on 8/30 with L hip fracture. He developed hypoxia and hypotension after surgery (L IM nail) on 9/4 and required re-intubation 9/4-9/6. Pt with septic shock from UTI, then found to have c diff, also with acute acaclulous colecystitis s/p perc drain 9/11.   PMH positive for MI, cardiomyopathy, CHF, first degree AV block, PAF, CKD III, bladder outlet obstruction, lumbar spondylosis, and TKA.    PT Comments    Pt admitted with above diagnosis. Pt currently with functional limitations due to balance and endurance deficits. Pt was able to sit EOB with min guard to mod assist needing cues to maintain stability.  Pt needed total assist to max assist to get to EOB.  Much more alert today.  Will follow acutely and progress as able.  Did change pt to 3xweek as he is much more participatory.  Pt will benefit from skilled PT to increase their independence and safety with mobility to allow discharge to the venue listed below.     Follow Up Recommendations  SNF     Equipment Recommendations  Other (comment) (TBA at next venue)    Recommendations for Other Services       Precautions / Restrictions Precautions Precautions: Fall Precaution Comments: rectal tube,  c-diff Restrictions Weight Bearing Restrictions: Yes LLE Weight Bearing: Partial weight bearing LLE Partial Weight Bearing Percentage or Pounds: 50    Mobility  Bed Mobility Overal bed mobility: Needs Assistance Bed Mobility: Rolling Rolling: +2 for physical assistance;Max assist;Total assist Sidelying to sit: Total assist;+2 for physical assistance;Max assist   Sit to supine: Total assist;+2 for physical assistance   General bed mobility comments: cues to reach for rail and pt able to  assist minimially.  Had to have assist for bringing LEs off bed and elevate trunk with some initiation but lacks follow through.   Transfers                    Ambulation/Gait                 Stairs            Wheelchair Mobility    Modified Rankin (Stroke Patients Only)       Balance Overall balance assessment: Needs assistance Sitting-balance support: Bilateral upper extremity supported;Feet supported Sitting balance-Leahy Scale: Poor Sitting balance - Comments: Initially leaning posteriorly and needced total assist to sit up.  Eventually obtained balance and had periods where he was min guard assist and then would need mod assist.  Sat a total of 12 min.  Pt needed cues constantly to correct posture and lean forward.  Postural control: Posterior lean                                  Cognition Arousal/Alertness: Awake/alert Behavior During Therapy: Anxious Overall Cognitive Status: Within Functional Limits for tasks assessed                                 General Comments: Pt A &O x 2 to person and place.  STates its Sep 4 and is unaware of what all has transpired since Surgery.  Exercises General Exercises - Upper Extremity Shoulder Flexion: 5 reps;Both;Supine;AAROM Elbow Extension: 5 reps;Both;Supine;AAROM General Exercises - Lower Extremity Ankle Circles/Pumps: Both;Supine;AAROM;5 reps Heel Slides: AAROM;Both;Supine;10 reps Hip ABduction/ADduction: AAROM;Both;10 reps;Supine    General Comments        Pertinent Vitals/Pain Pain Assessment: Faces Faces Pain Scale: Hurts even more Pain Location: left LE Pain Descriptors / Indicators: Grimacing;Guarding;Sore Pain Intervention(s): Limited activity within patient's tolerance;Monitored during session;Repositioned  Initial BP 107/67.  82/54 in sitting with pt asymptomatic; 94/60 on departure from room.  Other VSS.   Home Living                       Prior Function            PT Goals (current goals can now be found in the care plan section) Progress towards PT goals: Progressing toward goals    Frequency    Min 3X/week      PT Plan Frequency needs to be updated    Co-evaluation              AM-PAC PT "6 Clicks" Daily Activity  Outcome Measure  Difficulty turning over in bed (including adjusting bedclothes, sheets and blankets)?: Unable Difficulty moving from lying on back to sitting on the side of the bed? : Unable Difficulty sitting down on and standing up from a chair with arms (e.g., wheelchair, bedside commode, etc,.)?: Unable Help needed moving to and from a bed to chair (including a wheelchair)?: Total Help needed walking in hospital room?: Total Help needed climbing 3-5 steps with a railing? : Total 6 Click Score: 6    End of Session Equipment Utilized During Treatment: Gait belt Activity Tolerance: Patient limited by fatigue;Patient limited by pain Patient left: with family/visitor present;in bed;with call bell/phone within reach;with SCD's reapplied (nurse made aware bed alarm would not set) Nurse Communication: Mobility status;Need for lift equipment PT Visit Diagnosis: Muscle weakness (generalized) (M62.81);Difficulty in walking, not elsewhere classified (R26.2);Other symptoms and signs involving the nervous system (R29.898);Pain Pain - Right/Left: Left Pain - part of body: Hip     Time: 2202-5427 PT Time Calculation (min) (ACUTE ONLY): 31 min  Charges:  $Therapeutic Exercise: 8-22 mins $Therapeutic Activity: 8-22 mins                    G Codes:       Mylena Sedberry,PT Acute Rehabilitation [phone] (pager)    Berline Lopes 01/01/2017, 4:15 PM

## 2017-01-01 NOTE — Progress Notes (Signed)
Hypoglycemic Event  CBG: 61  Treatment: Orange Juice  Symptoms:  asymptomatic  Follow-up CBG: Time:1247 CBG Result:58  Possible Reasons for Event: Tube feeding discontinued.  Comments/MD notified:Patient given of orange juice with follow up patients BS dropped to 58.  Administered Dextrose 79ml.  Contacted MD and received an order to increase D5 to 110ml/hr.  Re-assessed patient BS 91.  Patient asymptomatic throughout.     Wende Crease

## 2017-01-01 NOTE — Consult Note (Signed)
The patient has been seen in conjunction with Suzzette Righter, NP-C. All aspects of care have been considered and discussed. The patient has been personally interviewed, examined, and all clinical data has been reviewed.   Chronically ill 83 year old gentleman with history of acute myocardial infarction and associated cardiogenic shock  October 2017,history of PAF,CKD stage III, bladder outlet obstruction, asbestosis,chronic combined systolic and diastolic heart failure with EF 45%,hepatic dysfunction, in whom we are being asked to evaluate a generalized edema in this state.  Vital signs are stable. O2 saturation greater than 90%. Sinus rhythm is noted on telemetryThe patient is lethargic and not able to give a history. Son is at the bedside. Neck veins did not appear distended. Arms and legs and abdominal wall or edema. Chest is clear anteriorly. Decreased breath sounds are heard at the bases. Cardiac exam reveals no gallop.  Azotemia is noted on laboratory data with BUN 50, creatinine 0.9, albumin 2.0, hepatic enzymes are mildly to moderately elevated.hemoglobin is 10.3.  The patient's generalized edematous state is related to low oncotic pressure. He does not appear to be in heart failure and there is no clinical evidence of pulmonary congestion.  Recommend checking BNP, continue nutrition, consider switching his standard/chronic Lasix to IV rather than oral dosing as edema may be preventing absorption. Follow kidney function closely.

## 2017-01-01 NOTE — Progress Notes (Signed)
Palliative Care Progress note  Reason for consult: Goals of care  I saw Mr. Athey this AM and discussed his case with Speech Therapy.    He appears more awake and interactive today and had FEES this AM with improvement in mucosa but continued concern for trace sensed and silent aspiration events.  I then met with three of his sons' and his daughter in person.  His wife and son, Jeanell Sparrow, joined [place] via phone.  We discussed his clinical course over the past several day including improvement in mentation and alertness.  I do believe that he has had component of delirium that may be improving with Zyprexa and better sleep/wake cycle.  Two of his sons appear frustrated during our meeting and report having concerns about care he has received in the hospital and people "giving up on him."  While they remain polite, they are clear that they feel that he has not gotten a clear care plan and it has been poor care that has caused many of his complications and continued decline.  His son stated that "he came in here a healthy man" and now he is potentially dying.  I sense mistrust in care and his son stated, "I know it all comes back to the money."  His family, including his wife, report being hopeful that he will improve, particularly based on improvement over the last couple of days.  They are in agreement with plan to reintroduce nutrition orally despite risk of aspiration and that he would not want a feeding tube.  We discussed plan as below: 1) Son reports wanting to "see what really happened" during surgery and wants to get medical records.  I told family this could be requested by his wife through Office of Medical Records 2) Son reports that no one has addressed his concerns that his father is not getting care he needs.  I gave family number for Office of Patient Experience and showed them section on resolving care problems in patient guide. 3) Family requests cardiology consult.  I will make sure primary  is aware of this request. 4) Family would like PT to show they exercises they can work on with him throughout the day 5) Family appreciates SLP working with him over the past several days.  Requests continued aggressive follow-up by SLP. 6) Family would like update on discharge plan.  Family reports that they were not notified until day of discharge that he would be discharged last admission and want to avoid this again.  7) We discussed plan over the next day or two will be to introduce liquids and then solids with speech guidance.  They are clear that he would not want a PEG tube, and I spent time explaining my concern that nutrition and aspiration will be major concerns.  His son had questions regarding IV nutrition as well as intermittent placement of feeding tube "every other day or something."  We discussed why these were not realistic options that would add quality time to his life.  We agreed on plan to see how he does with allowing PO intake despite risk of aspiration and continuing to develop plan of care over next couple of days based upon his clinical course.  Time in: 1005 Time out: 1115 Total time: 70 minutes Greater than 50%  of this time was spent counseling and coordinating care related to the above assessment and plan.    Micheline Rough, MD [facility] [phone]

## 2017-01-01 NOTE — Progress Notes (Signed)
Orthopedic Tech Progress Note Patient Details:  Danny Oneill 20-Mar-1932 [id number]  Ortho Devices Type of Ortho Device: Radio broadcast assistant Ortho Device/Splint Location: applied unna boots to pt left and right lower legs (bilateral).  pt tolerated application very well.     Ortho Device/Splint Interventions: Application   Alvina Chou 01/01/2017, 7:31 PM

## 2017-01-01 NOTE — Progress Notes (Signed)
[facility] TEAM 1 - Stepdown/ICU Lorin Gawron  ZOX:[id number] DOB: [birth year] DOA: 12/03/2016 PCP: Burnard Bunting, MD    Brief Narrative:  83yo M w/ a hx of asbestosis, CAD, Chronic CHF, First Degree AV block, Paroxysmal Atrial Fibrillation, HLD, Bladder Outlet Obstruction, and CKD stage III who presented with hip pain after a chair slid from underneath him causing him to fall to the floor.    In the ED an X-ray confirmed a hip fracture.  Significant Events: 8/30 admit after mechanical fall > hip fx  9/4 intubated - S/P LEFT intertrochantric intramedullary nailing - tx to ICU for peri-op hypotension 9/6 extubated 9/8 [place] - negative gallstones, slight sludge in the gallbladder and there is slight diffuse thickening of the gallbladder wall. Negative sonographic Murphy's sign. 9/10 GI, Surgery, and IR consulted  9/11 cholecystostomy tube placed 9/21 IR capped percutaneous cholecystostomy drain  Subjective: The patient appears more alert today than 2 days ago when I last saw him.  He still cannot provide a detailed history.  He does not appear to be uncomfortable nor does he appear to be in significant respiratory distress.  Assessment & Plan:  Acute metabolic encephalopathy. Multifactorial:  septic shock, high dose steroids, hypernatremia, mild uremia, sleep deprivation - appears to be improving - avoid sedatives as possible   LEFT hip fracture S/P LEFT intertrochanteric intramedullary nailing 9/4  Septic shock due to E coli UTI Shock resolved - f/u urine and blood cx w/o growth s/p abx course - now off broad spectrum abx   C diff colitis Complete 2 week course PO Vancomycin w/ first day of tx being considered as 9/18 - diarrhea persists at this time     Relative Adrenal insufficiency / Hypotension Was given a course of stress dose steroids - hemodynamically stable presently   Acute Acalculous Colecystitis  Abdominal US noted gallbladder sludge/diffuse thickening - +  Murphy sign on exam - 9/11 S/P percutaneous cholecystostomy drain > capped 9/21 w/ IR directing care of this issue    Elevated LFTs and ammonia level LFTs and Tbil w/o signif change - following in serial fashion    Acute respiratory failure with hypoxia / Chronic Asbestosis Resolved w/ pt now on RA   Chronic Systolic and Diastolic CHF TTE 11/04/17 EF 45-50% w/ grade 1 diastolic dysfunction, and repeat this admit essentially unchanged - anasarca persists and is quite worrisome to family - I have tried to explain that I feel this is primarily due to low albumin creating the scenario of intravascular volume depletion despite total body volume overload - clinically he is stable w/o signif pulm edema - his BP is too low to diurese him safely, and his Na has just now normalized w/ IV free water - in that I have been forced to increase his IVF due to hypoglycemia I will utilized a low dose diuretic in attempts to simply counter the excess volume, but I am not presently attempting to bring about a large net negative balance  Filed Weights   12/30/16 0455 12/31/16 0603 01/01/17 0500  Weight: 102 kg (224 lb 13.9 oz) 104.1 kg (229 lb 8 oz) 104.8 kg (231 lb 0.7 oz)    CAD S/P DES 02/2016 - no evidence of chest pain - was seen pre-op by his Cardiologist   Paroxysmal atrial fibrillation Currently in NSR  Acute renal failure  Resolved w/ crt now normal   Dysphagia SLP following - family agree the pt would NOT want a PEG - Cortrak now  out and pt on a modified diet - cont D5 IVF for now w/ episodic hypoglycemia   Moderate protein calorie malnutrition Follow intake w/ pt now po dependent - explained to family that improvement in albumin will be a long process   Hypernatremia Now corrected w/ free water via IV - cautiously dosing w/ low dose lasix as discussed above   BPH Flomax 0.4 mg daily  Oral candidiasis Cont Diflucan   Goals of Care  Palliative Care met w/ extended family today      DVT prophylaxis: SCDs Code Status: DNR - NO CODE Family Communication: spoke to one of the sons at the bedside  Disposition Plan: stable for transfer to med bed (believe stopping tele will assist in less agitation and better sleeping at night)  Consultants:  IR PCCM Orthopedics GI Palliative Care  Cardiology   Antimicrobials:  Oral Vanc 9/18 (effective) >  Objective: Blood pressure 92/72, pulse 68, temperature 98.4 F (36.9 C), temperature source Axillary, resp. rate (!) 27, height '6\' 1"'  (1.854 m), weight 104.8 kg (231 lb 0.7 oz), SpO2 98 %.  Intake/Output Summary (Last 24 hours) at 01/01/17 1226 Last data filed at 01/01/17 1203  Gross per 24 hour  Intake             5215 ml  Output             1870 ml  Net             3345 ml   Filed Weights   12/30/16 0455 12/31/16 0603 01/01/17 0500  Weight: 102 kg (224 lb 13.9 oz) 104.1 kg (229 lb 8 oz) 104.8 kg (231 lb 0.7 oz)    Examination: General: No acute respiratory distress - more alert and interactive  Lungs: Clear to auscultation bilaterally w/o wheezing or focal crackles  Cardiovascular: RRR - no M or rub  Abdomen: Nontender, nondistended, soft, bowel sounds positive, no rebound Extremities: 2+ anasarca   CBC:  Recent Labs Lab 12/26/16 1116 12/29/16 0440 12/31/16 0858  WBC 12.7* 11.6* 11.0*  NEUTROABS  --   --  9.1*  HGB 10.3* 10.7* 10.3*  HCT 33.1* 35.3* 34.1*  MCV 103.4* 106.6* 107.2*  PLT 325 208 527*   Basic Metabolic Panel:  Recent Labs Lab 12/26/16 0713  12/27/16 1136 12/28/16 0217 12/29/16 0242 12/30/16 0209 12/31/16 0402  NA 147*  < > 153* 151* 150* 147* 143  K 4.3  < > 4.5 4.2 3.3* 3.7 4.4  CL 119*  < > 123* 119* 119* 116* 112*  CO2 23  < > '25 24 24 25 24  ' GLUCOSE 180*  < > 194* 198* 153* 158* 129*  BUN 70*  < > 68* 66* 67* 60* 54*  CREATININE 0.86  < > 0.88 0.85 0.79 0.79 0.73  CALCIUM 7.9*  < > 8.1* 8.0* 7.9* 7.7* 7.5*  MG 2.2  --  2.3 2.1  --   --   --   < > = values in this interval  not displayed. GFR: Estimated Creatinine Clearance: 89 mL/min (by C-G formula based on SCr of 0.73 mg/dL).  Liver Function Tests:  Recent Labs Lab 12/26/16 1433 12/28/16 0217 12/29/16 0242 12/30/16 0209 12/31/16 0402  AST 77* 89* 80* 90* 94*  ALT 120* 113* 126* 135* 129*  ALKPHOS 127* 121 137* 166* 179*  BILITOT 7.1* 6.6* 6.2* 6.2* 5.1*  PROT 5.1* 5.2* 5.1* 5.2* 4.6*  ALBUMIN 1.7* 2.4* 2.3* 2.2* 2.0*    Recent Labs Lab  12/26/16 1433 12/30/16 0209  AMMONIA 30 24    HbA1C: Hgb A1c MFr Bld  Date/Time Value Ref Range Status  12/13/2016 03:35 AM 5.8 (H) 4.8 - 5.6 % Final    Comment:    (NOTE)         Prediabetes: 5.7 - 6.4         Diabetes: >6.4         Glycemic control for adults with diabetes: <7.0   02/02/2016 11:18 PM 5.9 (H) 4.8 - 5.6 % Final    Comment:    (NOTE)         Pre-diabetes: 5.7 - 6.4         Diabetes: >6.4         Glycemic control for adults with diabetes: <7.0     CBG:  Recent Labs Lab 12/31/16 1929 12/31/16 2315 01/01/17 0320 01/01/17 0733 01/01/17 1205  GLUCAP 143* 100* 120* 131* 61*    Recent Results (from the past 240 hour(s))  Culture, blood (routine x 2)     Status: None   Collection Time: 12/26/16  2:30 PM  Result Value Ref Range Status   Specimen Description BLOOD RIGHT ARM  Final   Special Requests IN PEDIATRIC BOTTLE Blood Culture adequate volume  Final   Culture NO GROWTH 5 DAYS  Final   Report Status 12/31/2016 FINAL  Final  Culture, blood (routine x 2)     Status: None   Collection Time: 12/26/16  2:38 PM  Result Value Ref Range Status   Specimen Description BLOOD LEFT HAND  Final   Special Requests   Final    BOTTLES DRAWN AEROBIC ONLY Blood Culture adequate volume   Culture NO GROWTH 5 DAYS  Final   Report Status 12/31/2016 FINAL  Final  Culture, Urine     Status: None   Collection Time: 12/26/16  6:19 PM  Result Value Ref Range Status   Specimen Description URINE, RANDOM  Final   Special Requests NONE  Final     Culture NO GROWTH  Final   Report Status 12/27/2016 FINAL  Final     Scheduled Meds: . aspirin  81 mg Per Tube Daily  . chlorhexidine  15 mL Mouth Rinse BID  . Chlorhexidine Gluconate Cloth  6 each Topical Daily  . free water  400 mL Per Tube 5 X Daily  . insulin aspart  0-20 Units Subcutaneous Q4H  . insulin glargine  10 Units Subcutaneous QHS  . lipase/protease/amylase  36,000 Units Oral Once   And  . sodium bicarbonate  650 mg Oral Once  . magic mouthwash  5 mL Oral TID  . mouth rinse  15 mL Mouth Rinse q12n4p  . midodrine  10 mg Per Tube TID WC  . OLANZapine  5 mg Per Tube QHS  . tamsulosin  0.4 mg Oral Daily     LOS: 29 days   Cherene Altes, MD [facility] Office  [phone] Pager - Text Page per Amion as per below:  On-Call/Text Page:      [email or web address]      password [email or web address]  If 7PM-7AM, please contact night-coverage [email or web address] Password [facility] 01/01/2017, 12:26 PM

## 2017-01-02 LAB — CBC
HCT: 35.1 % — ABNORMAL LOW (ref 39.0–52.0)
Hemoglobin: 10.8 g/dL — ABNORMAL LOW (ref 13.0–17.0)
MCH: 32.5 pg (ref 26.0–34.0)
MCHC: 30.8 g/dL (ref 30.0–36.0)
MCV: 105.7 fL — AB (ref 78.0–100.0)
PLATELETS: 111 10*3/uL — AB (ref 150–400)
RBC: 3.32 MIL/uL — AB (ref 4.22–5.81)
RDW: 25.3 % — ABNORMAL HIGH (ref 11.5–15.5)
WBC: 12.6 10*3/uL — AB (ref 4.0–10.5)

## 2017-01-02 LAB — GLUCOSE, CAPILLARY
GLUCOSE-CAPILLARY: 77 mg/dL (ref 65–99)
GLUCOSE-CAPILLARY: 79 mg/dL (ref 65–99)
GLUCOSE-CAPILLARY: 101 mg/dL — AB (ref 65–99)
Glucose-Capillary: 119 mg/dL — ABNORMAL HIGH (ref 65–99)
Glucose-Capillary: 121 mg/dL — ABNORMAL HIGH (ref 65–99)

## 2017-01-02 LAB — COMPREHENSIVE METABOLIC PANEL
ALT: 110 U/L — AB (ref 17–63)
ANION GAP: 7 (ref 5–15)
AST: 80 U/L — ABNORMAL HIGH (ref 15–41)
Albumin: 2 g/dL — ABNORMAL LOW (ref 3.5–5.0)
Alkaline Phosphatase: 158 U/L — ABNORMAL HIGH (ref 38–126)
BUN: 40 mg/dL — ABNORMAL HIGH (ref 6–20)
CALCIUM: 7.4 mg/dL — AB (ref 8.9–10.3)
CHLORIDE: 107 mmol/L (ref 101–111)
CO2: 22 mmol/L (ref 22–32)
CREATININE: 0.67 mg/dL (ref 0.61–1.24)
Glucose, Bld: 91 mg/dL (ref 65–99)
Potassium: 4.5 mmol/L (ref 3.5–5.1)
Sodium: 136 mmol/L (ref 135–145)
Total Bilirubin: 4.7 mg/dL — ABNORMAL HIGH (ref 0.3–1.2)
Total Protein: 4.8 g/dL — ABNORMAL LOW (ref 6.5–8.1)

## 2017-01-02 MED ORDER — INSULIN ASPART 100 UNIT/ML ~~LOC~~ SOLN: 0.0000 [IU] | Freq: Three times a day (TID) | SUBCUTANEOUS | Status: DC

## 2017-01-02 NOTE — Clinical Social Work Note (Signed)
CSW left voicemail for patient's wife. Will begin discussing potential SNF placement when she returns call.  Charlynn Court, CSW [phone]

## 2017-01-02 NOTE — Progress Notes (Signed)
   Subjective:  Responsive today, and states no pain in the left leg.  Objective:   VITALS:   Vitals:   01/01/17 2335 01/02/17 0400 01/02/17 0426 01/02/17 0725  BP: 112/66 109/72 105/61 119/65  Pulse: 62  67 69  Resp: 17 19 19 18   Temp: 98.2 F (36.8 C)  98.2 F (36.8 C) 97.6 F (36.4 C)  TempSrc: Axillary  Axillary Oral  SpO2: 97%  97% 98%  Weight:   104 kg (229 lb 4.5 oz)   Height:       General: Jaundice improved, anasarca persists  Left leg exam:  Incision about the hip on the lateral aspect are clean dry and intact. There is no drainage. Thigh is soft and nontender. Skin is warm and well-perfused.  Motor is intact distally with dorsal flexion plantar flexion of the foot.  1+ dorsalis pedis pulse. His calf is soft.   Lab Results  Component Value Date   WBC 12.6 (H) 01/02/2017   HGB 10.8 (L) 01/02/2017   HCT 35.1 (L) 01/02/2017   MCV 105.7 (H) 01/02/2017   PLT 111 (L) 01/02/2017   BMET    Component Value Date/Time   NA 136 01/02/2017 0207   NA 132 (A) 02/17/2016   K 4.5 01/02/2017 0207   CL 107 01/02/2017 0207   CO2 22 01/02/2017 0207   GLUCOSE 91 01/02/2017 0207   BUN 40 (H) 01/02/2017 0207   BUN 37 (A) 02/17/2016   CREATININE 0.67 01/02/2017 0207   CREATININE 1.25 (H) 03/16/2016 1229   CALCIUM 7.4 (L) 01/02/2017 0207   GFRNONAA >60 01/02/2017 0207   GFRAA >60 01/02/2017 0207     Assessment/Plan: 25 Days Post-Op   Principal Problem:   Closed left hip fracture (HCC) Active Problems:   Hyperlipidemia   CAD (coronary artery disease)   CKD (chronic kidney disease), stage III   Hip fracture (HCC)   Compromised airway   Hypovolemic shock (HCC)   Elevated troponin I level   Chronic combined systolic and diastolic heart failure (HCC)   Acute respiratory failure (HCC)   Elevated LFTs   -Clinically continues to improve.  - Aspirin and SCDs for DVT prophylaxis.  - He is appropriate for 50% weightbearing to the left lower extremity   - Left hip  x-rays reviewed which demonstrate stable position of his intramedullary nail. No signs of loosening or failure.  -dry dressing changes prn  Yolonda Kida 01/02/2017, 9:48 AM   Maryan Rued, MD [phone]

## 2017-01-02 NOTE — Progress Notes (Signed)
[facility] TEAM 1 - Stepdown/ICU Danny Oneill  WUJ:[id number] DOB: March 08, 1932 DOA: 12/03/2016 PCP: Geoffry Paradise, MD    Brief Narrative:  83yo M w/ a hx of asbestosis, CAD, Chronic CHF, First Degree AV block, Paroxysmal Atrial Fibrillation, HLD, Bladder Outlet Obstruction, and CKD stage III who presented with hip pain after a chair slid from underneath him causing him to fall to the floor.    In the ED an X-ray confirmed a hip fracture.  Significant Events: 8/30 admit after mechanical fall > hip fx  9/4 intubated - S/P LEFT intertrochantric intramedullary nailing - tx to ICU for peri-op hypotension 9/6 extubated 9/8 [place] - negative gallstones, slight sludge in the gallbladder and there is slight diffuse thickening of the gallbladder wall. Negative sonographic Murphy's sign. 9/10 GI, Surgery, and IR consulted  9/11 cholecystostomy tube placed 9/21 IR capped percutaneous cholecystostomy drain  Subjective: The pt is more alert today.  He is able to converse much more clearly.  He is tired of being in the hospital, and asks for water for his dry mouth.  He denies chest pain or abdom pain.    Assessment & Plan:  Acute metabolic encephalopathy. Multifactorial:  septic shock, high dose steroids, hypernatremia, mild uremia, sleep deprivation - continues to improve    LEFT hip fracture S/P LEFT intertrochanteric intramedullary nailing 9/4 - transfer to ortho floor in AM if remains stable to allow better access to PT/OT needs   Septic shock due to E coli UTI Shock resolved - f/u urine and blood cx w/o growth s/p abx course - now off broad spectrum abx   C diff colitis Complete 2 week course PO Vancomycin w/ first day of tx being considered as 9/18 - diarrhea persists at this time     Relative Adrenal insufficiency / Hypotension Was given a course of stress dose steroids - hemodynamically stable presently   Acute Acalculous Colecystitis  Abdominal US noted gallbladder  sludge/diffuse thickening - + Murphy sign on exam - 9/11 S/P percutaneous cholecystostomy drain > capped 9/21 w/ IR directing care of this issue    Elevated LFTs and ammonia level LFTs and Tbil now beginning to signif trend downward    Acute respiratory failure with hypoxia / Chronic Asbestosis Resolved w/ pt now on RA   Chronic Systolic and Diastolic CHF TTE 12/09/60 EF 45-50% w/ grade 1 diastolic dysfunction, and repeat this admit essentially unchanged - anasarca is primarily due to low albumin creating the scenario of intravascular volume depletion despite total body volume overload - he remains stable w/o signif pulm edema - cont low dose diuretic and follow - I have educated the family it will take weeks of good nutrition for this to improve  Filed Weights   12/31/16 0603 01/01/17 0500 01/02/17 0426  Weight: 104.1 kg (229 lb 8 oz) 104.8 kg (231 lb 0.7 oz) 104 kg (229 lb 4.5 oz)    CAD S/P DES 02/2016 - no evidence of chest pain - was seen pre-op by his Cardiologist   Paroxysmal atrial fibrillation Currently in NSR  Acute renal failure  Resolved w/ crt now normal   Dysphagia SLP following - family agree the pt would NOT want a PEG - Cortrak now out and pt on a modified diet   Moderate protein calorie malnutrition Follow intake w/ pt now po dependent - explained to family that improvement in albumin will be a long process   Hypernatremia Now corrected w/ free water via IV - cautiously dosing  w/ low dose lasix as discussed above - slow IV today and follow   BPH Flomax 0.4 mg daily  Oral candidiasis Cont diflucan for a 10 day course    DVT prophylaxis: SCDs Code Status: DNR - NO CODE Family Communication: spoke to one of the sons at the bedside  Disposition Plan: plan to transfer to Ortho floor in AM if stable over night   Consultants:  IR PCCM Orthopedics GI Palliative Care  Cardiology   Antimicrobials:  Oral Vanc 9/18 (effective) >  Objective: Blood  pressure (!) 103/58, pulse (!) 57, temperature (!) 97.4 F (36.3 C), temperature source Oral, resp. rate 15, height 6\' 1"  (1.854 m), weight 104 kg (229 lb 4.5 oz), SpO2 96 %.  Intake/Output Summary (Last 24 hours) at 01/02/17 1648 Last data filed at 01/02/17 1624  Gross per 24 hour  Intake          2650.33 ml  Output             2725 ml  Net           -74.67 ml   Filed Weights   12/31/16 0603 01/01/17 0500 01/02/17 0426  Weight: 104.1 kg (229 lb 8 oz) 104.8 kg (231 lb 0.7 oz) 104 kg (229 lb 4.5 oz)    Examination: General: No acute respiratory distress - alert and conversant  Lungs: CTA B - no wheezing or focal cracles  Cardiovascular: RRR - no M or rub  Abdomen: NT/ND, soft, bowel sounds positive, no rebound Extremities: 2+ anasarca w/o change   CBC:  Recent Labs Lab 12/29/16 0440 12/31/16 0858 01/02/17 0207  WBC 11.6* 11.0* 12.6*  NEUTROABS  --  9.1*  --   HGB 10.7* 10.3* 10.8*  HCT 35.3* 34.1* 35.1*  MCV 106.6* 107.2* 105.7*  PLT 208 137* 111*   Basic Metabolic Panel:  Recent Labs Lab 12/27/16 1136 12/28/16 0217 12/29/16 0242 12/30/16 0209 12/31/16 0402 01/02/17 0207  NA 153* 151* 150* 147* 143 136  K 4.5 4.2 3.3* 3.7 4.4 4.5  CL 123* 119* 119* 116* 112* 107  CO2 25 24 24 25 24 22   GLUCOSE 194* 198* 153* 158* 129* 91  BUN 68* 66* 67* 60* 54* 40*  CREATININE 0.88 0.85 0.79 0.79 0.73 0.67  CALCIUM 8.1* 8.0* 7.9* 7.7* 7.5* 7.4*  MG 2.3 2.1  --   --   --   --    GFR: Estimated Creatinine Clearance: 88.6 mL/min (by C-G formula based on SCr of 0.67 mg/dL).  Liver Function Tests:  Recent Labs Lab 12/28/16 0217 12/29/16 0242 12/30/16 0209 12/31/16 0402 01/02/17 0207  AST 89* 80* 90* 94* 80*  ALT 113* 126* 135* 129* 110*  ALKPHOS 121 137* 166* 179* 158*  BILITOT 6.6* 6.2* 6.2* 5.1* 4.7*  PROT 5.2* 5.1* 5.2* 4.6* 4.8*  ALBUMIN 2.4* 2.3* 2.2* 2.0* 2.0*    Recent Labs Lab 12/30/16 0209  AMMONIA 24    HbA1C: Hgb A1c MFr Bld  Date/Time Value  Ref Range Status  12/13/2016 03:35 AM 5.8 (H) 4.8 - 5.6 % Final    Comment:    (NOTE)         Prediabetes: 5.7 - 6.4         Diabetes: >6.4         Glycemic control for adults with diabetes: <7.0   02/02/2016 11:18 PM 5.9 (H) 4.8 - 5.6 % Final    Comment:    (NOTE)  Pre-diabetes: 5.7 - 6.4         Diabetes: >6.4         Glycemic control for adults with diabetes: <7.0     CBG:  Recent Labs Lab 01/01/17 2334 01/02/17 0422 01/02/17 0723 01/02/17 1126 01/02/17 1615  GLUCAP 112* 77 79 119* 121*    Recent Results (from the past 240 hour(s))  Culture, blood (routine x 2)     Status: None   Collection Time: 12/26/16  2:30 PM  Result Value Ref Range Status   Specimen Description BLOOD RIGHT ARM  Final   Special Requests IN PEDIATRIC BOTTLE Blood Culture adequate volume  Final   Culture NO GROWTH 5 DAYS  Final   Report Status 12/31/2016 FINAL  Final  Culture, blood (routine x 2)     Status: None   Collection Time: 12/26/16  2:38 PM  Result Value Ref Range Status   Specimen Description BLOOD LEFT HAND  Final   Special Requests   Final    BOTTLES DRAWN AEROBIC ONLY Blood Culture adequate volume   Culture NO GROWTH 5 DAYS  Final   Report Status 12/31/2016 FINAL  Final  Culture, Urine     Status: None   Collection Time: 12/26/16  6:19 PM  Result Value Ref Range Status   Specimen Description URINE, RANDOM  Final   Special Requests NONE  Final   Culture NO GROWTH  Final   Report Status 12/27/2016 FINAL  Final     Scheduled Meds: . aspirin  81 mg Oral Daily  . chlorhexidine  15 mL Mouth Rinse BID  . Chlorhexidine Gluconate Cloth  6 each Topical Daily  . furosemide  20 mg Intravenous Daily  . insulin aspart  0-20 Units Subcutaneous Q4H  . magic mouthwash  5 mL Oral TID  . mouth rinse  15 mL Mouth Rinse q12n4p  . midodrine  10 mg Oral TID WC  . OLANZapine  5 mg Per Tube QHS  . tamsulosin  0.4 mg Oral Daily     LOS: 30 days   Lonia Blood, MD [facility] Office  [phone] Pager - Text Page per Amion as per below:  On-Call/Text Page:      [email or web address]      password [email or web address]  If 7PM-7AM, please contact night-coverage [email or web address] Password [email or web address] 01/02/2017, 4:48 PM

## 2017-01-03 DIAGNOSIS — J189 Pneumonia, unspecified organism: Secondary | ICD-10-CM

## 2017-01-03 LAB — BASIC METABOLIC PANEL
Anion gap: 5 (ref 5–15)
BUN: 31 mg/dL — AB (ref 6–20)
CHLORIDE: 105 mmol/L (ref 101–111)
CO2: 25 mmol/L (ref 22–32)
CREATININE: 0.72 mg/dL (ref 0.61–1.24)
Calcium: 7.4 mg/dL — ABNORMAL LOW (ref 8.9–10.3)
GFR calc Af Amer: >60 mL/min (ref >60)
GFR calc non Af Amer: >60 mL/min (ref >60)
GLUCOSE: 111 mg/dL — AB (ref 65–99)
POTASSIUM: 4.2 mmol/L (ref 3.5–5.1)
SODIUM: 135 mmol/L (ref 135–145)

## 2017-01-03 LAB — CBC
HEMATOCRIT: 34.5 % — AB (ref 39.0–52.0)
Hemoglobin: 10.5 g/dL — ABNORMAL LOW (ref 13.0–17.0)
MCH: 32.1 pg (ref 26.0–34.0)
MCHC: 30.4 g/dL (ref 30.0–36.0)
MCV: 105.5 fL — AB (ref 78.0–100.0)
PLATELETS: 115 10*3/uL — AB (ref 150–400)
RBC: 3.27 MIL/uL — ABNORMAL LOW (ref 4.22–5.81)
RDW: 24.8 % — AB (ref 11.5–15.5)
WBC: 9.6 10*3/uL (ref 4.0–10.5)

## 2017-01-03 LAB — GLUCOSE, CAPILLARY
GLUCOSE-CAPILLARY: 162 mg/dL — AB (ref 65–99)
GLUCOSE-CAPILLARY: 110 mg/dL — AB (ref 65–99)
Glucose-Capillary: 97 mg/dL (ref 65–99)

## 2017-01-03 NOTE — Progress Notes (Addendum)
[place] TEAM 1 - Stepdown/ICU Danny Oneill  BZJ:[id number] DOB: Nov 23, 1931 DOA: 12/03/2016 PCP: Burnard Bunting, MD    Brief Narrative:  83yo M w/ a hx of asbestosis, CAD, Chronic CHF, First Degree AV block, Paroxysmal Atrial Fibrillation, HLD, Bladder Outlet Obstruction, and CKD stage III who presented with hip pain after a chair slid from underneath him causing him to fall to the floor.    In the ED an X-ray confirmed a hip fracture.  Significant Events: 8/30 admit after mechanical fall > hip fx  9/4 intubated - S/P LEFT intertrochantric intramedullary nailing - tx to ICU for peri-op hypotension 9/6 extubated 9/8 [place] - negative gallstones, slight sludge in the gallbladder and there is slight diffuse thickening of the gallbladder wall. Negative sonographic Murphy's sign. 9/10 GI, Surgery, and IR consulted  9/11 cholecystostomy tube placed 9/21 IR capped percutaneous cholecystostomy drain 9/25 Palliative Care met w/ family  9/28 Cards consulted at family request for edema  9/30 transfer to Ortho floor - MS improving   Subjective: The patient is mildly confused but more alert and interactive today.  His respirations appear comfortable and there is no evidence of uncontrolled pain.  His diffuse edema persists.  There is no nausea vomiting chest pain or shortness of breath.  Assessment & Plan:  Acute metabolic encephalopathy. Multifactorial:  septic shock, high dose steroids, hypernatremia, mild uremia, sleep deprivation - continues to improve - attempting to avoid sedatives and narcotics as able - steroids appear to have played a big role   LEFT hip fracture S/P LEFT intertrochanteric intramedullary nailing 9/4 - transfer to ortho floor to allow better access to PT/OT needs   Septic shock due to E coli UTI Shock resolved - f/u urine and blood cx w/o growth s/p abx course - now off broad spectrum abx   C diff colitis Complete 2 week course PO Vancomycin w/ first day of tx  being considered as 9/18 - diarrhea persists at this time w/ flexiseal in place   Relative Adrenal insufficiency / Hypotension Was given a course of stress dose steroids - hemodynamically stable presently   Acute Acalculous Colecystitis  Abdominal US noted gallbladder sludge/diffuse thickening - + Murphy sign on exam - 9/11 S/P percutaneous cholecystostomy drain > capped 9/21 w/ IR directing care of this issue   Elevated LFTs and ammonia level LFTs and Tbil now beginning to signif trend downward - recheck intermittently   Acute respiratory failure with hypoxia / Chronic Asbestosis Resolved w/ pt now on RA   Chronic Systolic and Diastolic CHF TTE 0/1/75 EF 45-50% w/ grade 1 diastolic dysfunction, and repeat this admit essentially unchanged - anasarca is primarily due to low albumin creating the scenario of intravascular volume depletion despite total body volume overload - he remains stable w/o signif pulm edema - cont low dose diuretic and follow - I have educated the family it will take weeks of good nutrition for this to improve  Filed Weights   01/01/17 0500 01/02/17 0426 01/03/17 0609  Weight: 104.8 kg (231 lb 0.7 oz) 104 kg (229 lb 4.5 oz) 107.4 kg (236 lb 12.4 oz)    CAD S/P DES 02/2016 - no evidence of chest pain - was seen pre-op by his Cardiologist and again in f/u consultation   Paroxysmal atrial fibrillation Currently in NSR  Acute renal failure  Resolved w/ crt now normal   Dysphagia SLP following - family agree the pt would NOT want a PEG - Cortrak now out and  pt on a modified diet which he appears to be tolerating well thus far   Moderate protein calorie malnutrition Follow intake w/ pt now po dependent - explained to family that improvement in albumin will be a long process   Hypernatremia corrected w/ free water via IV - cautiously dosing w/ low dose lasix as discussed above - decrease IVF again 9/30 and follow Na as well as CBG   Hypoglycemia No hx of  DM - appears to be improving - cont CBG checks only w/ no insulin - attempt to slowly wean off IV dextrose  Peripheral edema Due to above noted issues - pt having persisting neuropathic type pain in R foot since UNNA boot placed - d/c R LE UNNA today and follow - avoid neurontin or lyrica due to high risk of AMS  BPH Flomax 0.4 mg daily  Oral candidiasis Cont diflucan for a 10 day course    DVT prophylaxis: SCDs Code Status: DNR - NO CODE Family Communication: spoke to the patient's sister at bedside  Disposition Plan: transfer to Ortho floor - PT/OT - cont supportive care - will eventually need SNF for rehab stay   Consultants:  IR PCCM Orthopedics GI Palliative Care  Cardiology   Antimicrobials:  Oral Vanc 9/18 (effective) >  Objective: Blood pressure 107/69, pulse 68, temperature 97.8 F (36.6 C), temperature source Oral, resp. rate 16, height 6' 1" (1.854 m), weight 107.4 kg (236 lb 12.4 oz), SpO2 97 %.  Intake/Output Summary (Last 24 hours) at 01/03/17 0947 Last data filed at 01/03/17 0744  Gross per 24 hour  Intake          1632.17 ml  Output             3200 ml  Net         -1567.83 ml   Filed Weights   01/01/17 0500 01/02/17 0426 01/03/17 0609  Weight: 104.8 kg (231 lb 0.7 oz) 104 kg (229 lb 4.5 oz) 107.4 kg (236 lb 12.4 oz)    Examination: General: No acute respiratory distress - alert and conversant but mildly confused  Lungs: CTA B w/o wheezing or focal cracles  Cardiovascular: RRR  Abdomen: NT/ND, soft, bowel sounds positive, no rebound - GB tube intact w/o leakage or erythema in RUQ Extremities: 2+ anasarca worse in all gravity dependent areas to include hips/buttocks/GU region    CBC:  Recent Labs Lab 12/29/16 0440 12/31/16 0858 01/02/17 0207 01/03/17 0228  WBC 11.6* 11.0* 12.6* 9.6  NEUTROABS  --  9.1*  --   --   HGB 10.7* 10.3* 10.8* 10.5*  HCT 35.3* 34.1* 35.1* 34.5*  MCV 106.6* 107.2* 105.7* 105.5*  PLT 208 137* 111* 115*   Basic  Metabolic Panel:  Recent Labs Lab 12/27/16 1136 12/28/16 0217 12/29/16 0242 12/30/16 0209 12/31/16 0402 01/02/17 0207 01/03/17 0228  NA 153* 151* 150* 147* 143 136 135  K 4.5 4.2 3.3* 3.7 4.4 4.5 4.2  CL 123* 119* 119* 116* 112* 107 105  CO2 _0 GLUCOSE 194* 198* 153* 158* 129* 91 111*  BUN 68* 66* 67* 60* 54* 40* 31*  CREATININE 0.88 0.85 0.79 0.79 0.73 0.67 0.72  CALCIUM 8.1* 8.0* 7.9* 7.7* 7.5* 7.4* 7.4*  MG 2.3 2.1  --   --   --   --   --    GFR: Estimated Creatinine Clearance: 90 mL/min (by C-G formula based on SCr of 0.72 mg/dL).  Liver Function Tests:  Recent Labs Lab 12/28/16 0217 12/29/16 0242 12/30/16 0209 12/31/16 0402 01/02/17 0207  AST 89* 80* 90* 94* 80*  ALT 113* 126* 135* 129* 110*  ALKPHOS 121 137* 166* 179* 158*  BILITOT 6.6* 6.2* 6.2* 5.1* 4.7*  PROT 5.2* 5.1* 5.2* 4.6* 4.8*  ALBUMIN 2.4* 2.3* 2.2* 2.0* 2.0*    Recent Labs Lab 12/30/16 0209  AMMONIA 24    HbA1C: Hgb A1c MFr Bld  Date/Time Value Ref Range Status  12/13/2016 03:35 AM 5.8 (H) 4.8 - 5.6 % Final    Comment:    (NOTE)         Prediabetes: 5.7 - 6.4         Diabetes: >6.4         Glycemic control for adults with diabetes: <7.0   02/02/2016 11:18 PM 5.9 (H) 4.8 - 5.6 % Final    Comment:    (NOTE)         Pre-diabetes: 5.7 - 6.4         Diabetes: >6.4         Glycemic control for adults with diabetes: <7.0     CBG:  Recent Labs Lab 01/02/17 0723 01/02/17 1126 01/02/17 1615 01/02/17 2154 01/03/17 0738  GLUCAP 79 119* 121* 101* 97    Recent Results (from the past 240 hour(s))  Culture, blood (routine x 2)     Status: None   Collection Time: 12/26/16  2:30 PM  Result Value Ref Range Status   Specimen Description BLOOD RIGHT ARM  Final   Special Requests IN PEDIATRIC BOTTLE Blood Culture adequate volume  Final   Culture NO GROWTH 5 DAYS  Final   Report Status 12/31/2016 FINAL  Final  Culture, blood (routine x 2)     Status: None    Collection Time: 12/26/16  2:38 PM  Result Value Ref Range Status   Specimen Description BLOOD LEFT HAND  Final   Special Requests   Final    BOTTLES DRAWN AEROBIC ONLY Blood Culture adequate volume   Culture NO GROWTH 5 DAYS  Final   Report Status 12/31/2016 FINAL  Final  Culture, Urine     Status: None   Collection Time: 12/26/16  6:19 PM  Result Value Ref Range Status   Specimen Description URINE, RANDOM  Final   Special Requests NONE  Final   Culture NO GROWTH  Final   Report Status 12/27/2016 FINAL  Final     Scheduled Meds: . aspirin  81 mg Oral Daily  . chlorhexidine  15 mL Mouth Rinse BID  . Chlorhexidine Gluconate Cloth  6 each Topical Daily  . furosemide  20 mg Intravenous Daily  . insulin aspart  0-9 Units Subcutaneous TID WC  . magic mouthwash  5 mL Oral TID  . mouth rinse  15 mL Mouth Rinse q12n4p  . midodrine  10 mg Oral TID WC  . OLANZapine  5 mg Per Tube QHS  . tamsulosin  0.4 mg Oral Daily     LOS: 31 days   Cherene Altes, MD [facility] Office  [phone] Pager - Text Page per Amion as per below:  On-Call/Text Page:      [email or web address]      password [email or web address]  If 7PM-7AM, please contact night-coverage [email or web address] Password [facility] 01/03/2017, 9:47 AM

## 2017-01-03 NOTE — Progress Notes (Addendum)
  Speech Language Pathology Treatment: Dysphagia  Patient Details Name: Danny Oneill MRN: [id number] DOB: Mar 21, 1932 Today's Date: 01/03/2017 Time: 0370-9643 SLP Time Calculation (min) (ACUTE ONLY): 20 min  Assessment / Plan / Recommendation Clinical Impression  Patient seen for dysphagia follow-up for continued education/training in compensatory strategies and to attempt trials of puree with chin tuck. Pt's son present, states he noted pt had more difficulty with straw and has been feeding cup sips of nectar thick liquid. SLP educated re: results of FEES and recommendations, including chin tuck, effortful swallow, throat clear/hard cough after swallow, small sips. SLP assisted pt with performing oral care. He took small cup sips of nectar thick liquids with verbal and occasional tactile cues to limit flow rate/size of bolus. Required verbal cues for cough, hard swallow with intermittent wet vocal quality. Pt with reduced oral acceptance; refused puree trials despite max encouragement from SLP, son. Educated son re: cuing pt for compensations and assisting with slow rate of intake. Swallowing precautions sign given with written recommendations as pt had transferred from step-down without recommendations sheet. Will follow up to reattempt upgraded trials of puree. Continue full liquids (nectar thick) with meds crushed in puree, full supervision and use of swallowing precautions as posted. Oral care QID, please have oral suction available.    HPI HPI: 83 yo male with PMH of HLD, lumbar spondylosis, MI, CAD, PAF, cardiomyopathy, CKD, CHF who was admitted on 8/30 with L hip fracture. He developed hypoxia and hypotension after surgery (L IM nail) on 9/4 and required re-intubation 9/4-9/6. Pt with septic shock from UTI, then found to have c diff, also with acute acaclulous colecystitis s/p perc drain 9/11. SLP ordered for swallow evaluation as pt's alertness began to improve.      SLP Plan  Continue with  current plan of care       Recommendations  Diet recommendations: Nectar-thick liquid Liquids provided via: Cup;No straw Medication Administration: Crushed with puree Supervision: Staff to assist with self feeding Compensations: Slow rate;Small sips/bites;Minimize environmental distractions;Effortful swallow;Chin tuck;Clear throat intermittently;Hard cough after swallow Postural Changes and/or Swallow Maneuvers: Seated upright 90 degrees                General recommendations: Other(comment) (Have oral suction available) Oral Care Recommendations: Oral care QID Follow up Recommendations: Skilled Nursing facility SLP Visit Diagnosis: Dysphagia, oropharyngeal phase (R13.12) Plan: Continue with current plan of care       GO              Rondel Baton, MS, CCC-SLP Speech-Language Pathologist [phone]  Arlana Lindau 01/03/2017, 5:20 PM

## 2017-01-03 NOTE — Clinical Social Work Note (Signed)
CSW called patient's wife and confirmed continued interest in SNF. Assessment was completed on 9/4. CSW faxed referral to local SNF's and will leave SNF list in room with patient's sister. This was approved by patient's wife.  Charlynn Court, CSW [phone]

## 2017-01-03 NOTE — Progress Notes (Signed)
Palliative Care Progress note  Reason for consult: Goals of care  I saw Danny Oneill this afternoon.  One of his son's was present at the bedside.  His son reports that he has been having a really good day and has been "eating everything they put in front of him."  He reports that family is invested in plan to continue rehab.  Denies any further needs at this time.  Total time: 20 minutes Greater than 50%  of this time was spent counseling and coordinating care related to the above assessment and plan.    Romie Minus, MD [facility] Palliative Medicine Team [phone]

## 2017-01-03 NOTE — Clinical Social Work Placement (Signed)
   CLINICAL SOCIAL WORK PLACEMENT  NOTE  Date:  01/03/2017  Patient Details  Name: Danny Oneill MRN: [id number] Date of Birth: [birth year]  Clinical Social Work is seeking post-discharge placement for this patient at the Skilled  Nursing Facility level of care (*CSW will initial, date and re-position this form in  chart as items are completed):  Yes   Patient/family provided with [facility] Clinical Social Work Department's list of facilities offering this level of care within the geographic area requested by the patient (or if unable, by the patient's family).  Yes   Patient/family informed of their freedom to choose among providers that offer the needed level of care, that participate in Medicare, Medicaid or managed care program needed by the patient, have an available bed and are willing to accept the patient.  Yes   Patient/family informed of [facility]'s ownership interest in [facility] and [facility], as well as of the fact that they are under no obligation to receive care at these facilities.  PASRR submitted to EDS on 01/03/17     PASRR number received on       Existing PASRR number confirmed on 01/03/17     FL2 transmitted to all facilities in geographic area requested by pt/family on 01/03/17     FL2 transmitted to all facilities within larger geographic area on       Patient informed that his/her managed care company has contracts with or will negotiate with certain facilities, including the following:            Patient/family informed of bed offers received.  Patient chooses bed at       Physician recommends and patient chooses bed at      Patient to be transferred to   on  .  Patient to be transferred to facility by       Patient family notified on   of transfer.  Name of family member notified:        PHYSICIAN Please sign FL2     Additional Comment:    _______________________________________________ Margarito Liner, LCSW 01/03/2017, 10:16  AM

## 2017-01-03 NOTE — Progress Notes (Signed)
Patient to transfer to 5N14 report given to receiving nurse Gregary Signs, all questions answered at this time.  Pt. VSS with no s/s of distress noted patient stable for transfer.  Family notified of move.

## 2017-01-03 NOTE — Progress Notes (Signed)
Right Una boot removed per MD order.

## 2017-01-03 NOTE — NC FL2 (Signed)
[place] MEDICAID FL2 LEVEL OF CARE SCREENING TOOL     IDENTIFICATION  Patient Name: Danny Oneill Birthdate: 03-Jun-1931 Sex: male Admission Date (Current Location): 12/03/2016  [facility] and [place] Number:  Producer, television/film/video and Address:  [facility] [place], [address] [place], [place] [address]      Provider Number: [id number]  Attending Physician Name and Address:  Lonia Blood, MD  Relative Name and Phone Number:       Current Level of Care: Hospital Recommended Level of Care: Skilled Nursing Facility Prior Approval Number:    Date Approved/Denied: 12/08/16 PASRR Number: [id number]  Discharge Plan: SNF    Current Diagnoses: Patient Active Problem List   Diagnosis Date Noted  . Elevated LFTs   . Acute respiratory failure (HCC)   . Elevated troponin I level 12/11/2016  . Chronic combined systolic and diastolic heart failure (HCC) 12/11/2016  . Compromised airway   . Hypovolemic shock (HCC)   . Closed left hip fracture (HCC) 12/03/2016  . Hip fracture (HCC) 12/03/2016  . Allergic drug rash 03/09/2016  . Orthostatic hypotension 03/09/2016  . Hyponatremia 03/09/2016  . Hypokalemia 03/09/2016  . Bladder outlet obstruction 03/09/2016  . Urinary retention 03/09/2016  . First degree AV block 03/09/2016  . Aortic insufficiency 03/09/2016  . CKD (chronic kidney disease), stage III 03/09/2016  . Anemia   . Pericardial effusion 02/27/2016  . PAF (paroxysmal atrial fibrillation) (HCC) 02/27/2016  . CAD (coronary artery disease) 02/27/2016  . Cardiomyopathy, ischemic 02/27/2016  . Acute on chronic combined systolic and diastolic CHF (congestive heart failure) (HCC) 02/24/2016  . Systolic and diastolic CHF, acute (HCC) 02/13/2016  . Anterior and lateral ST segment elevation (HCC) 02/13/2016  . ARF (acute renal failure) (HCC) 02/13/2016  . Acute urinary retention 02/13/2016  . Hematuria 02/13/2016  . Elevated liver enzymes 02/13/2016  .  Bilateral lower extremity edema 02/13/2016  . Low BP 02/13/2016  . Hyperlipidemia 02/13/2016  . Acute ST elevation myocardial infarction (STEMI) (HCC)   . Acute inferolateral myocardial infarction (HCC) 02/02/2016  . Acute MI, inferolateral wall, initial episode of care (HCC)   . Arterial hypotension   . Overweight (BMI 25.0-29.9) 10/04/2013  . Expected blood loss anemia 10/04/2013  . S/P right TKA 10/03/2013    Orientation RESPIRATION BLADDER Height & Weight     Self  Normal Continent, Indwelling catheter Weight: 236 lb 12.4 oz (107.4 kg) Height:  6\' 1"  (185.4 cm)  BEHAVIORAL SYMPTOMS/MOOD NEUROLOGICAL BOWEL NUTRITION STATUS   (Cooperative, calm)  (None) Incontinent (Rectal tube with balloon, Biliary tube.) Diet (Full liquid. Fluid nectar thick.)  AMBULATORY STATUS COMMUNICATION OF NEEDS Skin   Total Care Verbally Bruising, Other (Comment), PU Stage and Appropriate Care, Surgical wounds (MASD, Skin tear, Weeping.)   PU Stage 2 Dressing: Daily (Mid sacrum: Foam.)                   Personal Care Assistance Level of Assistance  Total care       Total Care Assistance: Maximum assistance   Functional Limitations Info  Sight, Hearing, Speech Sight Info: Adequate Hearing Info: Adequate Speech Info: Impaired    SPECIAL CARE FACTORS FREQUENCY  PT (By licensed PT), OT (By licensed OT), Speech therapy     PT Frequency: 5 x week OT Frequency: 5 x week     Speech Therapy Frequency: 5 x week      Contractures Contractures Info: Not present    Additional Factors Info  Code Status, Allergies, Isolation Precautions Code Status Info: DNR Allergies Info: Amiodarone, Morphine and related     Isolation Precautions Info: Enteric precautions     Current Medications (01/03/2017):  This is the current hospital active medication list Current Facility-Administered Medications  Medication Dose Route Frequency Provider Last Rate Last Dose  . acetaminophen (TYLENOL) solution 650  mg  650 mg Oral Q6H PRN Lonia Blood, MD      . aspirin chewable tablet 81 mg  81 mg Oral Daily Lonia Blood, MD   81 mg at 01/03/17 0950  . chlorhexidine (PERIDEX) 0.12 % solution 15 mL  15 mL Mouth Rinse BID Nelda Bucks, MD   15 mL at 01/03/17 0950  . Chlorhexidine Gluconate Cloth 2 % PADS 6 each  6 each Topical Daily Simonne Martinet, NP   6 each at 01/03/17 [phone]  . dextrose 5 % solution   Intravenous Continuous Lonia Blood, MD 50 mL/hr at 01/02/17 1900    . fluconazole (DIFLUCAN) IVPB 400 mg  400 mg Intravenous Q24H [name], [name]   Stopped at 01/02/17 1900  . furosemide (LASIX) injection 20 mg  20 mg Intravenous Daily Suzzette Righter E, NP   20 mg at 01/03/17 0950  . insulin aspart (novoLOG) injection 0-9 Units  0-9 Units Subcutaneous TID WC Lonia Blood, MD      . magic mouthwash  5 mL Oral TID Drema Dallas, MD   5 mL at 01/03/17 0953  . MEDLINE mouth rinse  15 mL Mouth Rinse q12n4p Nelda Bucks, MD   15 mL at 01/02/17 1659  . midodrine (PROAMATINE) tablet 10 mg  10 mg Oral TID WC Lonia Blood, MD   10 mg at 01/03/17 0752  . OLANZapine (ZYPREXA) tablet 5 mg  5 mg Per Tube QHS Romie Minus, MD   5 mg at 01/02/17 2235  . ondansetron (ZOFRAN) injection 4 mg  4 mg Intravenous Q6H PRN Coralyn Helling, MD      . RESOURCE THICKENUP CLEAR   Oral PRN Lonia Blood, MD      . tamsulosin [facility]) capsule 0.4 mg  0.4 mg Oral Daily Drema Dallas, MD   0.4 mg at 12/31/16 0940     Discharge Medications: Please see discharge summary for a list of discharge medications.  Relevant Imaging Results:  Relevant Lab Results:   Additional Information SS#: [id number]  Margarito Liner, LCSW

## 2017-01-04 ENCOUNTER — Encounter (HOSPITAL_COMMUNITY): Payer: Self-pay | Admitting: Internal Medicine

## 2017-01-04 DIAGNOSIS — R945 Abnormal results of liver function studies: Secondary | ICD-10-CM

## 2017-01-04 DIAGNOSIS — L8990 Pressure ulcer of unspecified site, unspecified stage: Secondary | ICD-10-CM | POA: Insufficient documentation

## 2017-01-04 LAB — GLUCOSE, CAPILLARY
GLUCOSE-CAPILLARY: 81 mg/dL (ref 65–99)
GLUCOSE-CAPILLARY: 105 mg/dL — AB (ref 65–99)
GLUCOSE-CAPILLARY: 99 mg/dL (ref 65–99)
Glucose-Capillary: 126 mg/dL — ABNORMAL HIGH (ref 65–99)

## 2017-01-04 MED ORDER — ENSURE ENLIVE PO LIQD
237.0000 mL | Freq: Three times a day (TID) | ORAL | Status: DC
  Administered 2017-01-04 – 2017-01-14 (×22): 237 mL via ORAL

## 2017-01-04 NOTE — Progress Notes (Signed)
Progress Note    Danny Oneill  PRF:[id number] DOB: [birth year]  DOA: 12/03/2016 PCP: Burnard Bunting, MD    Brief Narrative:   Chief complaint: F/U sepsis, hip fracture  Medical records reviewed and are as summarized below:  Danny Oneill is an 83 y.o. male with a PMH of asbestosis, CAD, chronic CHF, first-degree AV block, PAF, HLD, bladder outlet obstruction and stage III CKD who was admitted for treatment of a hip fracture status post mechanical fall.  Significant Events: 8/30 admit after mechanical fall > hip fx  9/4 intubated - S/P LEFT intertrochantric intramedullary nailing - tx to ICU for peri-op hypotension 9/6 extubated 9/8 [place] - negative gallstones, slight sludge in the gallbladder and there is slight diffuse thickening of the gallbladder wall. Negative sonographic Murphy's sign. 9/10 GI, Surgery, and IR consulted  9/11 cholecystostomy tubeplaced 9/21 IR cappedpercutaneous cholecystostomy drain 9/25 Palliative Care met w/ family  9/28 Cards consulted at family request for edema  9/30 transfer to Ortho floor - MS improving    Assessment/Plan:   Principal Problem:   Closed left hip fracture ([place]) S/P LEFT intertrochanteric intramedullary nailing 9/4 /18 - transferred to ortho floor  01/03/17 to allow better access to PT/OT needs.  Will likely need rehab when medially stable.   Active Problems: Oral candidiasis To complete a 10 day course of Diflucan on 01/06/17. Also on MMW.  Acute metabolic encephalopathy. Multifactorial:  septic shock, high dose steroids, hypernatremia, mild uremia, sleep deprivation. limitsedatives and narcotics as able.  Mental status has improved.  Still a bit lethargic at times with poor activity tolerance.  Septic shock due to E coli UTI Shock resolved.  Follow-up blood and urine cultures were negative. Has completed his course of antibiotics.  C diff colitis Complete 2 week course PO Vancomycin w/ first day of tx being considered as  12/22/16 - diarrhea persists at this time w/ flexiseal in place.   Relative Adrenal insufficiency / Hypotension Was given a course of stress dose steroids - remains hemodynamically stable on midodrine. Coreg, Lisinopril remain on hold.  Acute Acalculous Colecystitis  Abdominal US noted gallbladder sludge/diffuse thickening - +Murphy sign on exam - 12/15/16  S/P percutaneous cholecystostomy drain > capped 12/25/16 w/ IR directing care of this issue. Still on a full liquid diet.  Elevated LFTs and ammonialevel LFTs and Tbil now beginning to significantly trend downward - recheck intermittently. Lipitor on hold.  Acute respiratory failure with hypoxia / Chronic Asbestosis Resolved w/ pt now on RA.   Chronic Systolic and Diastolic CHF/CAD/H/O MI S/P DES 02/2016. TTE 05/14/16 EF45-50% w/ grade 1 diastolic dysfunction, and repeat this admit essentially unchanged - anasarca is primarily due to low albumin creating the scenario of intravascular volume depletion despite total body volume overload - he remains stable w/o significant pulmonary edema - continue low dose diuretic and follow. Family continues to be anxious about anasarca. Reassured that it may take awhile to resolve. Continue ASA. Brilinta on hold.  Obesity/Moderate protein calorie malnutrition Body mass index is 31.7 kg/m. Continue nutritional supplements.   Paroxysmal atrial fibrillation Remains in NSR.  Acute renal failure  Resolved with creatinine now normal.  Dysphagia SLP following - family agree the pt would NOT want a PEG - Cortrak now out and pt on a modified diet which he appears to be tolerating well thus far. Still on FLs.    Hypernatremia Corrected w/ free water via IV - cautiously dosing w/ low dose lasix as discussed above - decrease  IVF again 9/30 and follow Na as well as CBG.   Hypoglycemia No hx of DM - appears to be improving - cont CBG checks only w/ no insulin - attempt to slowly wean off IV  dextrose.  Peripheral edema Due to above noted issues - pt having persisting neuropathic type pain in R foot since UNNA boot placed - UNNA discontinued 01/03/17.  Avoid neurontin or lyrica due to high risk of AMS.  BPH Flomax 0.4 mg daily  Family Communication/Anticipated D/C date and plan/Code Status   DVT prophylaxis: SCDs ordered. Code Status: DNR Family Communication: Wife at bedside. Disposition Plan: SNF when medically stable. Still tenuous.   Medical Consultants:    IR  PCCM  Orthopedics  GI  Palliative Care   Cardiology    Anti-Infectives:    Diflucan 12/27/16--->  Oral Vanc 9/18 (effective) --->  Subjective:   Mildly lethargic, but awakens to voice, says he feels ok.  Appetite poor, ate some grits and drank some OJ.   Objective:    Vitals:   01/03/17 1314 01/03/17 1500 01/03/17 2109 01/04/17 0500  BP: 107/65 (!) 105/43 (!) 101/48 110/64  Pulse: 67 69 64 66  Resp: '16 16 16 16  ' Temp: 97.7 F (36.5 C) 97.8 F (36.6 C) 97.7 F (36.5 C) 97.8 F (36.6 C)  TempSrc: Axillary Oral Oral Oral  SpO2: 100% 100% 100% 97%  Weight:    109 kg (240 lb 4.8 oz)  Height:        Intake/Output Summary (Last 24 hours) at 01/04/17 0859 Last data filed at 01/04/17 0641  Gross per 24 hour  Intake           891.17 ml  Output             1725 ml  Net          -833.83 ml   Filed Weights   01/02/17 0426 01/03/17 0609 01/04/17 0500  Weight: 104 kg (229 lb 4.5 oz) 107.4 kg (236 lb 12.4 oz) 109 kg (240 lb 4.8 oz)    Exam:  General: No acute distress. Mildly lethargic, chronically ill appearing. Cardiovascular: Heart sounds show a regular rate, and rhythm. No gallops or rubs. No murmurs. No JVD. Lungs: Diminished bilaterally with fair air movement. No rales, rhonchi or wheezes. Abdomen: Soft, nontender, nondistended with normal active bowel sounds. No masses. No hepatosplenomegaly. Neurological: Mildly lethargic. Mildly confused/disoriented. Moves all extremities  4 with generalized weakness. Cranial nerves II through XII grossly intact. Skin: Warm and dry. No rashes or lesions. Extremities: No clubbing or cyanosis. 2-3+ edema. Pedal pulses 2+.  Data Reviewed:   I have personally reviewed following labs and imaging studies:  Labs: Labs reviewed 01/04/17 show the following:   Basic Metabolic Panel:  Recent Labs Lab 12/29/16 0242 12/30/16 0209 12/31/16 0402 01/02/17 0207 01/03/17 0228  NA 150* 147* 143 136 135  K 3.3* 3.7 4.4 4.5 4.2  CL 119* 116* 112* 107 105  CO2 '24 25 24 22 25  ' GLUCOSE 153* 158* 129* 91 111*  BUN 67* 60* 54* 40* 31*  CREATININE 0.79 0.79 0.73 0.67 0.72  CALCIUM 7.9* 7.7* 7.5* 7.4* 7.4*   GFR Estimated Creatinine Clearance: 90.5 mL/min (by C-G formula based on SCr of 0.72 mg/dL). Liver Function Tests:  Recent Labs Lab 12/29/16 0242 12/30/16 0209 12/31/16 0402 01/02/17 0207  AST 80* 90* 94* 80*  ALT 126* 135* 129* 110*  ALKPHOS 137* 166* 179* 158*  BILITOT 6.2* 6.2* 5.1* 4.7*  PROT 5.1* 5.2* 4.6* 4.8*  ALBUMIN 2.3* 2.2* 2.0* 2.0*    Recent Labs Lab 12/30/16 0209  AMMONIA 24   CBC:  Recent Labs Lab 12/29/16 0440 12/31/16 0858 01/02/17 0207 01/03/17 0228  WBC 11.6* 11.0* 12.6* 9.6  NEUTROABS  --  9.1*  --   --   HGB 10.7* 10.3* 10.8* 10.5*  HCT 35.3* 34.1* 35.1* 34.5*  MCV 106.6* 107.2* 105.7* 105.5*  PLT 208 137* 111* 115*   CBG:  Recent Labs Lab 01/02/17 2154 01/03/17 0738 01/03/17 1142 01/03/17 2255 01/04/17 0617  GLUCAP 101* 97 162* 110* 64   Microbiology Recent Results (from the past 240 hour(s))  Culture, blood (routine x 2)     Status: None   Collection Time: 12/26/16  2:30 PM  Result Value Ref Range Status   Specimen Description BLOOD RIGHT ARM  Final   Special Requests IN PEDIATRIC BOTTLE Blood Culture adequate volume  Final   Culture NO GROWTH 5 DAYS  Final   Report Status 12/31/2016 FINAL  Final  Culture, blood (routine x 2)     Status: None   Collection Time:  12/26/16  2:38 PM  Result Value Ref Range Status   Specimen Description BLOOD LEFT HAND  Final   Special Requests   Final    BOTTLES DRAWN AEROBIC ONLY Blood Culture adequate volume   Culture NO GROWTH 5 DAYS  Final   Report Status 12/31/2016 FINAL  Final  Culture, Urine     Status: None   Collection Time: 12/26/16  6:19 PM  Result Value Ref Range Status   Specimen Description URINE, RANDOM  Final   Special Requests NONE  Final   Culture NO GROWTH  Final   Report Status 12/27/2016 FINAL  Final    Procedures and diagnostic studies:  No results found.  Medications:   . aspirin  81 mg Oral Daily  . chlorhexidine  15 mL Mouth Rinse BID  . Chlorhexidine Gluconate Cloth  6 each Topical Daily  . furosemide  20 mg Intravenous Daily  . magic mouthwash  5 mL Oral TID  . mouth rinse  15 mL Mouth Rinse q12n4p  . midodrine  10 mg Oral TID WC  . OLANZapine  5 mg Per Tube QHS  . tamsulosin  0.4 mg Oral Daily   Continuous Infusions: . dextrose 20 mL/hr at 01/03/17 1820  . fluconazole (DIFLUCAN) IV Stopped (01/03/17 2200)     LOS: 32 days   Berthe Oley  [facility] Pager [phone]. If unable to reach me by pager, please call my cell phone at [phone].  *Please refer to [email or web address], password [email or web address] to get updated schedule on who will round on this patient, as hospitalists switch teams weekly. If 7PM-7AM, please contact night-coverage at [email or web address], password [email or web address] for any overnight needs.  01/04/2017, 8:59 AM

## 2017-01-04 NOTE — Consult Note (Signed)
WOC Nurse wound consult note Reason for Consult: sacrum Patient with significant 3 + pitting edema over his arms, trunk, hip, scrotum, legs  Wound type: Stage 2 Pressure injury sacrum Pressure Injury POA:No Measurement:2.5cm x 3.1cm x 0.1cm  Wound LKJ:ZPHXTAV thickness skin loss, with darkness/purple Drainage (amount, consistency, odor) minimal, some drainage but feel it is from the leakage from the flexiseal (mucous) Periwound: blanchable but red Dressing procedure/placement/frequency: Continue foam dressing, monitor site.  Add low air loss for moisture management and pressure redistribution. Turn and reposition every 2 hours.  Maximize nutrition for wound healing If soilage of the foam dressing continues despite use of FMS, would switch to thick based barrier cream.    WOC Nurse team will follow along with you for weekly wound assessments.  Please notify me of any acute changes in the wounds or any new areas of concerns Kiaira Pointer [facility] MSN, RN,CWOCN, CNS, CWON-AP [phone]

## 2017-01-04 NOTE — Progress Notes (Signed)
  Speech Language Pathology Treatment: Dysphagia  Patient Details Name: Danny Oneill MRN: [id number] DOB: 06-Nov-1931 Today's Date: 01/04/2017 Time: 3474-2595 SLP Time Calculation (min) (ACUTE ONLY): 23 min  Assessment / Plan / Recommendation Clinical Impression  Pt seen for dysphagia f/u and PO trials of nectar-thick, puree and solid consistencies. Pt's wife and daughter present, reported improvement when eating and drinking with intermittent coughing. Pt tolerated nectar-thick consistencies with intermittent immediate throat clearing consistent with FEES results from 9/28; improved strength of throat clear/cough noted and pt demonstrated ability to feed himself independently. When presented with solid consistencies, pt exhibited immediate cough likely 2/2 residue per review of FEES results. Reviewed diet recommendations, compensatory strategies and FEES results with family and stressed the importance of continued oral care. Recommend Dys 1 (puree) with nectar-thick liquids and meds crushed in puree, full supervision and use of swallowing precautions posted in room (hard swallow, throat clear, small bites/sips). Will continue to monitor for diet toleration and upgrade as pt's status improves.   HPI HPI: 83 yo male with PMH of HLD, lumbar spondylosis, MI, CAD, PAF, cardiomyopathy, CKD, CHF who was admitted on 8/30 with L hip fracture. He developed hypoxia and hypotension after surgery (L IM nail) on 9/4 and required re-intubation 9/4-9/6. Pt with septic shock from UTI, then found to have c diff, also with acute acaclulous colecystitis s/p perc drain 9/11. SLP ordered for swallow evaluation as pt's alertness began to improve.      SLP Plan  Continue with current plan of care       Recommendations  Diet recommendations: Dysphagia 1 (puree);Nectar-thick liquid Liquids provided via: Cup;No straw Medication Administration: Crushed with puree Supervision: Staff to assist with self  feeding Compensations: Slow rate;Small sips/bites;Minimize environmental distractions;Effortful swallow;Chin tuck;Clear throat intermittently;Hard cough after swallow Postural Changes and/or Swallow Maneuvers: Seated upright 90 degrees                Oral Care Recommendations: Oral care QID Follow up Recommendations: Skilled Nursing facility SLP Visit Diagnosis: Dysphagia, oropharyngeal phase (R13.12) Plan: Continue with current plan of care       GO                Carmela Rima, Student SLP 01/04/2017, 11:30 AM

## 2017-01-04 NOTE — Progress Notes (Signed)
Nutrition Follow-up  DOCUMENTATION CODES:   Non-severe (moderate) malnutrition in context of chronic illness  INTERVENTION:  Provide Ensure Enlive po TID (thickened to nectar thick consistency), each supplement provides 350 kcal and 20 grams of protein.  Encourage adequate PO intake.   NUTRITION DIAGNOSIS:   Malnutrition (moderate) related to chronic illness (CAD, CKD) as evidenced by mild depletion of body fat, mild depletion of muscle mass, percent weight loss (9% weight loss in 3 months); ongoing  GOAL:   Patient will meet greater than or equal to 90% of their needs; progressing  MONITOR:   PO intake, Supplement acceptance, Diet advancement, Labs, Weight trends, Skin, I & O's  REASON FOR ASSESSMENT:   Consult Enteral/tube feeding initiation and management  ASSESSMENT:   83 yo male with PMH of HLD, lumbar spondylosis, MI, CAD, PAF, cardiomyopathy, CKD, CHF who was admitted on 8/30 with L hip fracture. He developed hypoxia and hypotension after surgery (L IM nail) on 9/4 and required re-intubation and transfer to the ICU.  Cortrak NGT out and tube feeds stopped 9/28. Pt is currently on a full liquid diet with nectar thick consistency. Meal completion has been 25%. Wife at bedside has been encouraging pt to eat at meals. RD to order Ensure to aid in adequate nutrition.   Labs and medications reviewed.   Diet Order:  Diet full liquid Room service appropriate? No; Fluid consistency: Nectar Thick  Skin:  Wound (see comment) (Stage II to sacrum, incision L thigh)  Last BM:  9/30  Height:   Ht Readings from Last 1 Encounters:  12/23/16 6\' 1"  (1.854 m)    Weight:   Wt Readings from Last 1 Encounters:  01/04/17 240 lb 4.8 oz (109 kg)    Ideal Body Weight:  83.6 kg  BMI:  Body mass index is 31.7 kg/m.  Estimated Nutritional Needs:   Kcal:  1900-2250 kcals  Protein:  115-136 g   Fluid:  2 L  EDUCATION NEEDS:   No education needs identified at this  time  Roslyn Smiling, MS, RD, LDN Pager # [phone] After hours/ weekend pager # [phone]

## 2017-01-04 NOTE — Progress Notes (Addendum)
Palliative Care Progress note  Reason for consult: Goals of care  I saw Danny Oneill this afternoon.  Daughter present at the bedside and reports that he continues to do well.  Daughter clear that family is invested in plan to continue rehab.  Denies any further needs at this time.  Palliative will no longer round on him daily, and family to call if any needs.  Please let us know if we can be of further assistance in the care of Danny Oneill.  Total time: 20 minutes Greater than 50%  of this time was spent counseling and coordinating care related to the above assessment and plan.    Romie Minus, MD [name] Palliative Medicine Team [phone]

## 2017-01-04 NOTE — Progress Notes (Signed)
Physical Therapy Treatment Patient Details Name: Danny Oneill MRN: [id number] DOB: November 15, 1931 Today's Date: 01/04/2017    History of Present Illness 83 yo male with PMH of HLD, lumbar spondylosis, MI, CAD, PAF, cardiomyopathy, CKD, CHF who was admitted on 8/30 with L hip fracture. He developed hypoxia and hypotension after surgery (L IM nail) on 9/4 and required re-intubation 9/4-9/6. Pt with septic shock from UTI, then found to have c diff, also with acute acaclulous colecystitis s/p perc drain 9/11.   PMH positive for MI, cardiomyopathy, CHF, first degree AV block, PAF, CKD III, bladder outlet obstruction, lumbar spondylosis, and TKA.    PT Comments    Pt performed supine to sit transfer and able to tolerated reaching activities outside BOS.  Pt with increased WOB with reaching activities and at times he would reach when not prompted, unclear if patient is presenting with vision defecits.  Pt able to sit unsupported for an extended period of time but required +102max/total assistance for bed mobility during todays session.  Plan next session to attempt standing in stedy frame.  Pt will still require placement at SNF to improve strength and functional mobility before returning to private residence.     Follow Up Recommendations  SNF     Equipment Recommendations  Other (comment) (TBA at next venue)    Recommendations for Other Services       Precautions / Restrictions Precautions Precautions: Fall Precaution Comments: rectal tube,  c-diff Restrictions Weight Bearing Restrictions: Yes LLE Weight Bearing: Partial weight bearing LLE Partial Weight Bearing Percentage or Pounds: 50    Mobility  Bed Mobility Overal bed mobility: Needs Assistance Bed Mobility: Rolling;Supine to Sit;Sit to Supine Rolling: Total assist;+2 for physical assistance (rolling to R and L to remove soiled bed pads.  )   Supine to sit: Max assist;+2 for physical assistance Sit to supine: +2 for physical  assistance;Total assist   General bed mobility comments: Pt required assistance to advance B LEs to edge of bed and to elevate trunk into sitting.  Pt resistive initially to forward weight shifting and required facilitation and cueing to forward weight shifting.  Pt then able to tolerated unsupported sitting and reaching activities.  Post sitting balance patient required total +2 to return to supine position.    Transfers Overall transfer level:  (Transfer not performed as patient remains too weak.  )                  Ambulation/Gait                 Stairs            Wheelchair Mobility    Modified Rankin (Stroke Patients Only)       Balance Overall balance assessment: Needs assistance Sitting-balance support: Bilateral upper extremity supported;Feet supported Sitting balance-Leahy Scale: Poor Sitting balance - Comments: Initially leaning posteriorly and needced total assist to sit up.  Eventually obtained balance and had periods where he was supervision.  Pt sat edge of bed x 10 min.  Balance ranged from zero to fair.   Postural control: Posterior lean                                  Cognition Arousal/Alertness: Awake/alert Behavior During Therapy: Flat affect Overall Cognitive Status: Impaired/Different from baseline Area of Impairment: Orientation  Orientation Level: Situation;Time;Place             General Comments: Pt wanting to get dressed and pull his car around.  Pt unaware of his deficits.        Exercises      General Comments        Pertinent Vitals/Pain Pain Assessment: 0-10 Pain Score: 6  Pain Location: left LE Pain Descriptors / Indicators: Grimacing;Guarding;Sore Pain Intervention(s): Monitored during session;Repositioned    Home Living                      Prior Function            PT Goals (current goals can now be found in the care plan section) Acute Rehab PT  Goals Patient Stated Goal: Per sister to return home with assistance Potential to Achieve Goals: Fair Progress towards PT goals: Progressing toward goals    Frequency    Min 3X/week      PT Plan Current plan remains appropriate    Co-evaluation              AM-PAC PT "6 Clicks" Daily Activity  Outcome Measure  Difficulty turning over in bed (including adjusting bedclothes, sheets and blankets)?: Unable Difficulty moving from lying on back to sitting on the side of the bed? : Unable Difficulty sitting down on and standing up from a chair with arms (e.g., wheelchair, bedside commode, etc,.)?: Unable Help needed moving to and from a bed to chair (including a wheelchair)?: Total Help needed walking in hospital room?: Total Help needed climbing 3-5 steps with a railing? : Total 6 Click Score: 6    End of Session   Activity Tolerance: Patient limited by fatigue;Patient limited by pain Patient left: with family/visitor present;in bed;with call bell/phone within reach;with SCD's reapplied;with nursing/sitter in room (nurse in room post session giving meds.  )   PT Visit Diagnosis: Muscle weakness (generalized) (M62.81);Difficulty in walking, not elsewhere classified (R26.2);Other symptoms and signs involving the nervous system (R29.898);Pain Pain - Right/Left: Left Pain - part of body: Hip     Time: 1610-9604 PT Time Calculation (min) (ACUTE ONLY): 41 min  Charges:  $Therapeutic Activity: 23-37 mins $Neuromuscular Re-education: 8-22 mins                    G Codes:       Danny Oneill, PTA pager [phone]    Danny Oneill 01/04/2017, 4:17 PM

## 2017-01-05 LAB — COMPREHENSIVE METABOLIC PANEL
ALK PHOS: 152 U/L — AB (ref 38–126)
ALT: 80 U/L — ABNORMAL HIGH (ref 17–63)
ANION GAP: 9 (ref 5–15)
AST: 65 U/L — ABNORMAL HIGH (ref 15–41)
Albumin: 1.8 g/dL — ABNORMAL LOW (ref 3.5–5.0)
BILIRUBIN TOTAL: 3.9 mg/dL — AB (ref 0.3–1.2)
BUN: 21 mg/dL — ABNORMAL HIGH (ref 6–20)
CALCIUM: 7.4 mg/dL — AB (ref 8.9–10.3)
CO2: 25 mmol/L (ref 22–32)
Chloride: 102 mmol/L (ref 101–111)
Creatinine, Ser: 0.76 mg/dL (ref 0.61–1.24)
GFR calc Af Amer: >60 mL/min (ref >60)
GLUCOSE: 88 mg/dL (ref 65–99)
Potassium: 4 mmol/L (ref 3.5–5.1)
Sodium: 136 mmol/L (ref 135–145)
TOTAL PROTEIN: 4.3 g/dL — AB (ref 6.5–8.1)

## 2017-01-05 LAB — CBC
HCT: 30.7 % — ABNORMAL LOW (ref 39.0–52.0)
HEMOGLOBIN: 9.6 g/dL — AB (ref 13.0–17.0)
MCH: 32.2 pg (ref 26.0–34.0)
MCHC: 31.3 g/dL (ref 30.0–36.0)
MCV: 103 fL — ABNORMAL HIGH (ref 78.0–100.0)
Platelets: 103 10*3/uL — ABNORMAL LOW (ref 150–400)
RBC: 2.98 MIL/uL — AB (ref 4.22–5.81)
RDW: 23.2 % — ABNORMAL HIGH (ref 11.5–15.5)
WBC: 6.8 10*3/uL (ref 4.0–10.5)

## 2017-01-05 LAB — GLUCOSE, CAPILLARY
GLUCOSE-CAPILLARY: 82 mg/dL (ref 65–99)
GLUCOSE-CAPILLARY: 109 mg/dL — AB (ref 65–99)
GLUCOSE-CAPILLARY: 137 mg/dL — AB (ref 65–99)
Glucose-Capillary: 144 mg/dL — ABNORMAL HIGH (ref 65–99)

## 2017-01-05 NOTE — Progress Notes (Signed)
Progress Note    Danny Oneill  QDI:[id number] DOB: 04/03/1932  DOA: 12/03/2016 PCP: Burnard Bunting, MD    Brief Narrative:   Chief complaint: F/U sepsis, hip fracture  Medical records reviewed and are as summarized below:  Danny Oneill is an 83 y.o. male with a PMH of asbestosis, CAD, chronic CHF, first-degree AV block, PAF, HLD, bladder outlet obstruction and stage III CKD who was admitted for treatment of a hip fracture status post mechanical fall.  Significant Events: 8/30 admit after mechanical fall > hip fx  9/4 intubated - S/P LEFT intertrochantric intramedullary nailing - tx to ICU for peri-op hypotension 9/6 extubated 9/8 [place] - negative gallstones, slight sludge in the gallbladder and there is slight diffuse thickening of the gallbladder wall. Negative sonographic Murphy's sign. 9/10 GI, Surgery, and IR consulted  9/11 cholecystostomy tubeplaced 9/21 IR cappedpercutaneous cholecystostomy drain 9/25 Palliative Care met w/ family  9/28 Cards consulted at family request for edema  9/30 transfer to Ortho floor - MS improving    Assessment/Plan:   Principal Problem:   Closed left hip fracture ([place]) S/P LEFT intertrochanteric intramedullary nailing 9/4 /18 - transferred to ortho floor  01/03/17 to allow better access to PT/OT needs.  Will likely need rehab when medially stable. Is a bit more lethargic today, but wife reports that she participated in PT earlier.  Active Problems: Oral candidiasis To complete a 10 day course of Diflucan on 01/06/17. Also on MMW.  Acute metabolic encephalopathy. Multifactorial:  septic shock, high dose steroids, hypernatremia, mild uremia, sleep deprivation. limitsedatives and narcotics as able.  Mental status has improved, but waxes and wanes.  Still a bit lethargic at times with poor activity tolerance. He was confused last night and wife felt it was related to him receiving Zyprexa. We'll discontinue and monitor.  Septic shock due  to E coli UTI Shock resolved.  Follow-up blood and urine cultures were negative. Has completed his course of antibiotics.  C diff colitis Complete 2 week course PO Vancomycin w/ first day of tx being considered as 12/22/16 - continue flexiseal as the patient still has diarrhea.   Relative Adrenal insufficiency / Hypotension Was given a course of stress dose steroids - remains hemodynamically stable on midodrine. Coreg, Lisinopril remain on hold.  Acute Acalculous Colecystitis  Abdominal US noted gallbladder sludge/diffuse thickening - +Murphy sign on exam - 12/15/16  S/P percutaneous cholecystostomy drain > capped 12/25/16 w/ IR directing care of this issue. Diet advanced.  Elevated LFTs and ammonialevel LFTs and Tbil now beginning to significantly trend downward - recheck intermittently. Lipitor on hold.  Acute respiratory failure with hypoxia / Chronic Asbestosis Resolved w/ pt now on RA.   Chronic Systolic and Diastolic CHF/CAD/H/O MI S/P DES 02/2016. TTE 05/14/16 EF45-50% w/ grade 1 diastolic dysfunction, and repeat this admit essentially unchanged - anasarca is primarily due to low albumin creating the scenario of intravascular volume depletion despite total body volume overload - he remains stable w/o significant pulmonary edema - continue low dose diuretic and follow. Family continues to be anxious about anasarca. Reassured that it may take awhile to resolve. Continue ASA. Brilinta on hold.  Obesity/Moderate protein calorie malnutrition Body mass index is 27.06 kg/m. Continue nutritional supplements.   Paroxysmal atrial fibrillation Remains in NSR.  Acute renal failure  Resolved with creatinine now normal.  Dysphagia SLP following - family agree the pt would NOT want a PEG - Cortrak now out and pt on a modified diet which he  appears to be tolerating well thus far. Diet advanced.    Hypernatremia Corrected w/ free water via IV - cautiously dosing w/ low dose lasix as  discussed above - decrease IVF again 9/30 and follow Na as well as CBG.   Hypoglycemia No hx of DM - appears to be improving - cont CBG checks only w/ no insulin - attempt to slowly wean off IV dextrose. Should improve as oral intake improves.  Peripheral edema Due to above noted issues - pt having persisting neuropathic type pain in R foot since UNNA boot placed - UNNA discontinued 01/03/17.  Avoid neurontin or lyrica due to high risk of AMS.  BPH Flomax 0.4 mg daily  Family Communication/Anticipated D/C date and plan/Code Status   DVT prophylaxis: SCDs ordered. Code Status: DNR Family Communication: Wife at bedside. Disposition Plan: SNF when medically stable. Still tenuous.   Medical Consultants:    IR  PCCM  Orthopedics  GI  Palliative Care   Cardiology    Anti-Infectives:    Diflucan 12/27/16--->  Oral Vanc 9/18 (effective) --->  Subjective:   More lethargic today and less interactive with me. Wife says he continues to eat small amounts and that he participated in physical therapy earlier today which wiped him out. He also reports that he was confused and talking out of his head last night. She is concerned that it is from the Zyprexa.  Objective:    Vitals:   01/04/17 1500 01/04/17 2223 01/05/17 0612 01/05/17 0700  BP: 108/60 117/66 113/62   Pulse: 68 79 72   Resp: _0 Temp: 97.8 F (36.6 C) 97.7 F (36.5 C) 98.7 F (37.1 C)   TempSrc: Oral Oral Axillary   SpO2: 97% 99% 98%   Weight:    93 kg (205 lb 1.6 oz)  Height:        Intake/Output Summary (Last 24 hours) at 01/05/17 0804 Last data filed at 01/05/17 0609  Gross per 24 hour  Intake           786.33 ml  Output             2500 ml  Net         -1713.67 ml   Filed Weights   01/03/17 0609 01/04/17 0500 01/05/17 0700  Weight: 107.4 kg (236 lb 12.4 oz) 109 kg (240 lb 4.8 oz) 93 kg (205 lb 1.6 oz)    Exam:  General: No acute distress.More lethargic today. Cardiovascular: Heart  sounds show a regular rate, and rhythm. No gallops or rubs. No murmurs. No JVD. Lungs: Diminished with fair air movement. No rales, rhonchi or wheezes. Abdomen: Soft, nontender, nondistended with normal active bowel sounds. No masses. No hepatosplenomegaly. Skin: Warm and dry. No rashes or lesions. Extremities: No clubbing or cyanosis. No edema. Pedal pulses 2+.   Data Reviewed:   I have personally reviewed following labs and imaging studies:  Labs: Labs reviewed 01/05/17 show the following:   Basic Metabolic Panel:  Recent Labs Lab 12/30/16 0209 12/31/16 0402 01/02/17 0207 01/03/17 0228 01/05/17 0459  NA 147* 143 136 135 136  K 3.7 4.4 4.5 4.2 4.0  CL 116* 112* 107 105 102  CO2 _1 GLUCOSE 158* 129* 91 111* 88  BUN 60* 54* 40* 31* 21*  CREATININE 0.79 0.73 0.67 0.72 0.76  CALCIUM 7.7* 7.5* 7.4* 7.4* 7.4*   GFR Estimated Creatinine Clearance: 79.1 mL/min (by C-G formula based on SCr of 0.76  mg/dL). Liver Function Tests:  Recent Labs Lab 12/30/16 0209 12/31/16 0402 01/02/17 0207 01/05/17 0459  AST 90* 94* 80* 65*  ALT 135* 129* 110* 80*  ALKPHOS 166* 179* 158* 152*  BILITOT 6.2* 5.1* 4.7* 3.9*  PROT 5.2* 4.6* 4.8* 4.3*  ALBUMIN 2.2* 2.0* 2.0* 1.8*    Recent Labs Lab 12/30/16 0209  AMMONIA 24   CBC:  Recent Labs Lab 12/31/16 0858 01/02/17 0207 01/03/17 0228 01/05/17 0459  WBC 11.0* 12.6* 9.6 6.8  NEUTROABS 9.1*  --   --   --   HGB 10.3* 10.8* 10.5* 9.6*  HCT 34.1* 35.1* 34.5* 30.7*  MCV 107.2* 105.7* 105.5* 103.0*  PLT 137* 111* 115* 103*   CBG:  Recent Labs Lab 01/04/17 0617 01/04/17 1218 01/04/17 1701 01/04/17 2217 01/05/17 0656  GLUCAP 81 105* 126* 99 54   Microbiology Recent Results (from the past 240 hour(s))  Culture, blood (routine x 2)     Status: None   Collection Time: 12/26/16  2:30 PM  Result Value Ref Range Status   Specimen Description BLOOD RIGHT ARM  Final   Special Requests IN PEDIATRIC BOTTLE Blood  Culture adequate volume  Final   Culture NO GROWTH 5 DAYS  Final   Report Status 12/31/2016 FINAL  Final  Culture, blood (routine x 2)     Status: None   Collection Time: 12/26/16  2:38 PM  Result Value Ref Range Status   Specimen Description BLOOD LEFT HAND  Final   Special Requests   Final    BOTTLES DRAWN AEROBIC ONLY Blood Culture adequate volume   Culture NO GROWTH 5 DAYS  Final   Report Status 12/31/2016 FINAL  Final  Culture, Urine     Status: None   Collection Time: 12/26/16  6:19 PM  Result Value Ref Range Status   Specimen Description URINE, RANDOM  Final   Special Requests NONE  Final   Culture NO GROWTH  Final   Report Status 12/27/2016 FINAL  Final    Procedures and diagnostic studies:  No results found.  Medications:   . aspirin  81 mg Oral Daily  . chlorhexidine  15 mL Mouth Rinse BID  . Chlorhexidine Gluconate Cloth  6 each Topical Daily  . feeding supplement (ENSURE ENLIVE)  237 mL Oral TID WC  . furosemide  20 mg Intravenous Daily  . magic mouthwash  5 mL Oral TID  . mouth rinse  15 mL Mouth Rinse q12n4p  . midodrine  10 mg Oral TID WC  . OLANZapine  5 mg Per Tube QHS  . tamsulosin  0.4 mg Oral Daily   Continuous Infusions: . dextrose 20 mL/hr at 01/03/17 1820  . fluconazole (DIFLUCAN) IV Stopped (01/04/17 1800)     LOS: 33 days   Karmen Altamirano  [facility] Pager [phone]. If unable to reach me by pager, please call my cell phone at [phone].  *Please refer to [email or web address], password [email or web address] to get updated schedule on who will round on this patient, as hospitalists switch teams weekly. If 7PM-7AM, please contact night-coverage at [email or web address], password [email or web address] for any overnight needs.  01/05/2017, 8:04 AM

## 2017-01-05 NOTE — Social Work (Signed)
CSW met with sister at bedside to discuss SNF offers. Sister indicated that she will review and let CSW know which SNF they will select for patient.  CSW discussed with her needing to get Insurance Auth prior to placement, once SNF is selected.  CSW will continue to follow up.  Elissa Hefty, LCSW Clinical Social Worker [phone]

## 2017-01-06 DIAGNOSIS — D696 Thrombocytopenia, unspecified: Secondary | ICD-10-CM

## 2017-01-06 DIAGNOSIS — S72002D Fracture of unspecified part of neck of left femur, subsequent encounter for closed fracture with routine healing: Secondary | ICD-10-CM

## 2017-01-06 LAB — GLUCOSE, CAPILLARY
GLUCOSE-CAPILLARY: 85 mg/dL (ref 65–99)
GLUCOSE-CAPILLARY: 128 mg/dL — AB (ref 65–99)
GLUCOSE-CAPILLARY: 116 mg/dL — AB (ref 65–99)
Glucose-Capillary: 116 mg/dL — ABNORMAL HIGH (ref 65–99)

## 2017-01-06 MED ORDER — SACCHAROMYCES BOULARDII 250 MG PO CAPS
250.0000 mg | ORAL_CAPSULE | Freq: Two times a day (BID) | ORAL | Status: DC
  Administered 2017-01-07 – 2017-01-15 (×17): 250 mg via ORAL
  Filled 2017-01-06 (×18): qty 1

## 2017-01-06 NOTE — Progress Notes (Signed)
Progress Note    Leandro Berkowitz  LGX:[id number] DOB: 1931-11-11  DOA: 12/03/2016 PCP: Burnard Bunting, MD    Brief Narrative:   Chief complaint: F/U sepsis, hip fracture  Medical records reviewed and are as summarized below:  Danny Oneill is an 83 y.o. male with a PMH of asbestosis, CAD, chronic CHF, first-degree AV block, PAF, HLD, bladder outlet obstruction and stage III CKD who was admitted for treatment of a hip fracture status post mechanical fall.  Significant Events: 8/30 admit after mechanical fall > hip fx  9/4 intubated - S/P LEFT intertrochantric intramedullary nailing - tx to ICU for peri-op hypotension 9/6 extubated 9/8 [place] - negative gallstones, slight sludge in the gallbladder and there is slight diffuse thickening of the gallbladder wall. Negative sonographic Murphy's sign. 9/10 GI, Surgery, and IR consulted  9/11 cholecystostomy tubeplaced 9/21 IR cappedpercutaneous cholecystostomy drain 9/25 Palliative Care met w/ family  9/28 Cards consulted at family request for edema  9/30 transfer to Ortho floor - MS improving    Assessment/Plan:   Principal Problem:   Closed left hip fracture ([place]) S/P LEFT intertrochanteric intramedullary nailing 12/08/16 - transferred to ortho floor 01/03/17 to allow better access to PT/OT needs. Will likely need rehab when medially stable. Seen PT working with him today and requires lots of assistance. SNF at discharge. Physically deconditioned.  Active Problems: Oral candidiasis Completed 10 day course of Diflucan. Also on MMW.  Acute metabolic encephalopathy. Multifactorial:  septic shock, high dose steroids, hypernatremia, mild uremia, sleep deprivation. limitsedatives and narcotics as able.  Mental status has improved, but continues to wax and wane.  Still a bit lethargic at times with poor activity tolerance. Zyprexa discontinued. As per RN, patient much more alert compared to 2 days ago.  Septic shock due to E coli  UTI Shock resolved.  Follow-up blood and urine cultures were negative. Has completed his course of antibiotics.  C diff colitis Completed 2 week course PO Vancomycin on 2/26. Ongoing diarrhea - continue flexiseal as the patient still has diarrhea. Added probiotics. Discuss with ID in a.m.  Relative Adrenal insufficiency / Hypotension Was given a course of stress dose steroids - remains hemodynamically stable on midodrine. Coreg, Lisinopril remain on hold.  Acute Acalculous Colecystitis  Abdominal US noted gallbladder sludge/diffuse thickening - +Murphy sign on exam - 12/15/16  S/P percutaneous cholecystostomy drain > capped 12/25/16 w/ IR directing care of this issue. Diet advanced but eating little.  Elevated LFTs and ammonialevel LFTs and Tbil now beginning to significantly trend downward - recheck intermittently. Lipitor on hold.  Acute respiratory failure with hypoxia / Chronic Asbestosis Resolved w/ pt now on RA.   Chronic Systolic and Diastolic CHF/CAD/H/O MI S/P DES 02/2016. TTE 05/14/16 EF45-50% w/ grade 1 diastolic dysfunction, and repeat this admit essentially unchanged - anasarca is primarily due to low albumin creating the scenario of intravascular volume depletion despite total body volume overload - he remains stable w/o significant pulmonary edema - continue low dose diuretic and follow. Family continues to be anxious about anasarca. Reassured that it may take awhile to resolve. Continue ASA. Brilinta on hold.  Obesity/Moderate protein calorie malnutrition Body mass index is 28.56 kg/m. Continue nutritional supplements.   Paroxysmal atrial fibrillation Remains in NSR.  Acute renal failure  Resolved with creatinine now normal.  Dysphagia SLP following - family agree the pt would NOT want a PEG - Cortrak now out and pt on a modified diet which he appears to be tolerating well  thus far. Diet advanced.    Hypernatremia Corrected w/ free water via IV - cautiously  dosing w/ low dose lasix as discussed above . Resolved. Discontinue D5W.  Hypoglycemia No hx of DM - appears to be improving - cont CBG checks only w/ no insulin - attempt to slowly wean off IV dextrose. Should improve as oral intake improves. No hypoglycemia in the last few days. Discontinue D5W and monitor closely.  Peripheral edema Due to above noted issues - pt having persisting neuropathic type pain in R foot since UNNA boot placed - UNNA discontinued 01/03/17.  Avoid neurontin or lyrica due to high risk of AMS.  BPH Flomax 0.4 mg daily. Still has Foley catheter.  Anemia Stable. Likely secondary to acute illness and chronic disease.  Thrombocytopenia - Likely due to acute illness and antimicrobials. Gradually trending down but no bleeding reported. Follow CBC in a.m.    Family Communication/Anticipated D/C date and plan/Code Status   DVT prophylaxis: SCDs ordered. Code Status: DNR Family Communication: Discussed in detail with patient's daughter at bedside. Disposition Plan: SNF when medically stable. Still tenuous.   Medical Consultants:    IR  PCCM  Orthopedics  GI  Palliative Care   Cardiology    Anti-Infectives:    Diflucan 12/27/16--->  Oral Vanc 9/18 (effective) --->  Subjective:   Pleasantly confused. As per daughter at bedside, patient thinks he is in [place] and wants to get up and go home. As per RN who had him 2 days ago, patient is much more alert. Waxing and waning confusion. Poor appetite. Ongoing diarrhea and still has flexi seal.  Objective:    Vitals:   01/05/17 1421 01/05/17 2100 01/06/17 0500 01/06/17 1430  BP: 98/61 110/60 (!) 96/52 98/63  Pulse: 66 68 77 67  Resp: '14 14 15 16  ' Temp: (!) 97.5 F (36.4 C) 97.6 F (36.4 C) 97.6 F (36.4 C) (!) 97.5 F (36.4 C)  TempSrc: Axillary Axillary Oral Axillary  SpO2: 100% 100% 95% 100%  Weight:   98.2 kg (216 lb 7.9 oz)   Height:        Intake/Output Summary (Last 24 hours) at  01/06/17 1729 Last data filed at 01/06/17 1430  Gross per 24 hour  Intake              170 ml  Output             1050 ml  Net             -880 ml   Filed Weights   01/04/17 0500 01/05/17 0700 01/06/17 0500  Weight: 109 kg (240 lb 4.8 oz) 93 kg (205 lb 1.6 oz) 98.2 kg (216 lb 7.9 oz)    Exam:  General: Pleasant elderly male, moderately built, frail, working with 3 members from PT team this afternoon. Cardiovascular: S1 and S2 heard, RRR. No JVD or murmurs. Trace bilateral leg edema. Telemetry: Low voltage, lots of tremor artifacts, sinus rhythm. Lungs: Diminished breath sounds in the bases but no wheezing, rhonchi or crackles. Rest of lung [place] clear to auscultation. No increased work of breathing. Abdomen: Soft, nontender, nondistended with normal active bowel sounds. No masses. No hepatosplenomegaly. Stable without change. Skin: Warm and dry. No rashes or lesions. Extremities: No clubbing or cyanosis. Pedal pulses 2+. CNS: Alert and oriented only to self. No focal deficits.   Data Reviewed:   I have personally reviewed following labs and imaging studies:  Labs: Labs reviewed 01/05/17 show the following:  Basic Metabolic Panel:  Recent Labs Lab 12/31/16 0402 01/02/17 0207 01/03/17 0228 01/05/17 0459  NA 143 136 135 136  K 4.4 4.5 4.2 4.0  CL 112* 107 105 102  CO2 '24 22 25 25  ' GLUCOSE 129* 91 111* 88  BUN 54* 40* 31* 21*  CREATININE 0.73 0.67 0.72 0.76  CALCIUM 7.5* 7.4* 7.4* 7.4*   GFR Estimated Creatinine Clearance: 86.3 mL/min (by C-G formula based on SCr of 0.76 mg/dL). Liver Function Tests:  Recent Labs Lab 12/31/16 0402 01/02/17 0207 01/05/17 0459  AST 94* 80* 65*  ALT 129* 110* 80*  ALKPHOS 179* 158* 152*  BILITOT 5.1* 4.7* 3.9*  PROT 4.6* 4.8* 4.3*  ALBUMIN 2.0* 2.0* 1.8*   No results for input(s): AMMONIA in the last 168 hours. CBC:  Recent Labs Lab 12/31/16 0858 01/02/17 0207 01/03/17 0228 01/05/17 0459  WBC 11.0* 12.6* 9.6 6.8    NEUTROABS 9.1*  --   --   --   HGB 10.3* 10.8* 10.5* 9.6*  HCT 34.1* 35.1* 34.5* 30.7*  MCV 107.2* 105.7* 105.5* 103.0*  PLT 137* 111* 115* 103*   CBG:  Recent Labs Lab 01/05/17 1651 01/05/17 2059 01/06/17 0725 01/06/17 1129 01/06/17 1639  GLUCAP 144* 137* 85 128* 116*   Microbiology No results found for this or any previous visit (from the past 240 hour(s)).  Procedures and diagnostic studies:  No results found.  Medications:   . aspirin  81 mg Oral Daily  . chlorhexidine  15 mL Mouth Rinse BID  . Chlorhexidine Gluconate Cloth  6 each Topical Daily  . feeding supplement (ENSURE ENLIVE)  237 mL Oral TID WC  . furosemide  20 mg Intravenous Daily  . magic mouthwash  5 mL Oral TID  . mouth rinse  15 mL Mouth Rinse q12n4p  . midodrine  10 mg Oral TID WC  . tamsulosin  0.4 mg Oral Daily   Continuous Infusions: . dextrose 20 mL/hr at 01/03/17 1820     LOS: 51 days   HONGALGI,ANAND, MD, FACP, FHM. [facility] Pager [phone]  If 7PM-7AM, please contact night-coverage [email or web address] Password [email or web address] 01/06/2017, 5:39 PM

## 2017-01-06 NOTE — Telephone Encounter (Signed)
Hi Pete, have you spoken with patient's son? We'll be happy to close this note if you have. Thanks!

## 2017-01-06 NOTE — Progress Notes (Signed)
Physical Therapy Treatment Patient Details Name: Danny Oneill MRN: [id number] DOB: 10-23-1931 Today's Date: 01/06/2017    History of Present Illness 83 yo male with PMH of HLD, lumbar spondylosis, MI, CAD, PAF, cardiomyopathy, CKD, CHF who was admitted on 8/30 with L hip fracture. He developed hypoxia and hypotension after surgery (L IM nail) on 9/4 and required re-intubation 9/4-9/6. Pt with septic shock from UTI, then found to have c diff, also with acute acaclulous colecystitis s/p perc drain 9/11.   PMH positive for MI, cardiomyopathy, CHF, first degree AV block, PAF, CKD III, bladder outlet obstruction, lumbar spondylosis, and TKA.    PT Comments    Pt performed attempted sit to stand transfer at [facility].  Pt unable to clear his bottom despite 3 assist.  Pt required return to supine and use of maxisky lift to transition from bed to chair.  Pt sitting on geomat in chair and RN aware of return to bed with maxisky.  Pt remains to present with weakness which continues to limit his function.  He is making slow gains and still continues to benefit from rehab in post acute setting before returning home to private residence.    Follow Up Recommendations  SNF     Equipment Recommendations  Other (comment) (TBA at next venue)    Recommendations for Other Services       Precautions / Restrictions Precautions Precautions: Fall Precaution Comments: rectal tube,  c-diff Restrictions Weight Bearing Restrictions: Yes LLE Weight Bearing: Partial weight bearing LLE Partial Weight Bearing Percentage or Pounds: 50    Mobility  Bed Mobility Overal bed mobility: Needs Assistance Bed Mobility: Rolling Rolling: Total assist;+2 for physical assistance (Once patient returned to supine required use of maxisky lift pad, therefore rolling to R and L to place pad.  )   Supine to sit: +2 for physical assistance;Max assist Sit to supine: +2 for physical assistance;Total assist   General bed mobility  comments: Pt required assistance to advance B LEs to edge of bed and to elevate trunk into sitting.  Pt resistive initially to forward weight shifting and required facilitation and cueing for forward weight shifting. Pt able to follow commands for hand placement and sat edge of bed holding to cross bar in stedy frame.  Pt attempted transfer and unsuccessful and require total assistance to return to bed.      Transfers Overall transfer level: Needs assistance Equipment used: None             General transfer comment: bed to chair via maxisky lift.  Pt attempted sit to stand from elevated bed in stedy frame but patient was unable to clear bottoms despite +3 assistance.    Ambulation/Gait                 Stairs            Wheelchair Mobility    Modified Rankin (Stroke Patients Only)       Balance Overall balance assessment: Needs assistance   Sitting balance-Leahy Scale: Poor Sitting balance - Comments: Pt remains with posterior pelvic tilt and posterior lean.  Pt required max assistance to maintain sitting balance.   Postural control: Posterior lean                                  Cognition Arousal/Alertness: Awake/alert Behavior During Therapy: Flat affect Overall Cognitive Status: Impaired/Different from baseline Area of Impairment: Orientation  Orientation Level: Situation;Time;Place             General Comments: Pt reports he is in [place] and unable to recall why he is here.  Pt is unaware of his deficits.        Exercises      General Comments        Pertinent Vitals/Pain Pain Assessment: Faces Faces Pain Scale: Hurts little more Pain Location: B knees with use of stedy.   Pain Descriptors / Indicators: Grimacing;Guarding;Sore Pain Intervention(s): Monitored during session;Repositioned    Home Living Family/patient expects to be discharged to:: Skilled nursing facility                     Prior Function            PT Goals (current goals can now be found in the care plan section) Acute Rehab PT Goals Patient Stated Goal: Per sister to return home with assistance Potential to Achieve Goals: Fair Progress towards PT goals: Progressing toward goals    Frequency    Min 3X/week      PT Plan Current plan remains appropriate    Co-evaluation              AM-PAC PT "6 Clicks" Daily Activity  Outcome Measure  Difficulty turning over in bed (including adjusting bedclothes, sheets and blankets)?: Unable Difficulty moving from lying on back to sitting on the side of the bed? : Unable Difficulty sitting down on and standing up from a chair with arms (e.g., wheelchair, bedside commode, etc,.)?: Unable Help needed moving to and from a bed to chair (including a wheelchair)?: Total Help needed walking in hospital room?: Total Help needed climbing 3-5 steps with a railing? : Total 6 Click Score: 6    End of Session Equipment Utilized During Treatment: Gait belt Activity Tolerance: Patient limited by fatigue;Patient limited by pain Patient left: with family/visitor present;in bed;with call bell/phone within reach;with SCD's reapplied;with nursing/sitter in room Nurse Communication: Mobility status;Need for lift equipment PT Visit Diagnosis: Muscle weakness (generalized) (M62.81);Difficulty in walking, not elsewhere classified (R26.2);Other symptoms and signs involving the nervous system (R29.898);Pain Pain - Right/Left: Left Pain - part of body: Hip     Time: 1610-9604 PT Time Calculation (min) (ACUTE ONLY): 35 min  Charges:  $Therapeutic Activity: 23-37 mins                    G Codes:       Joycelyn Rua, PTA pager [phone]    Florestine Avers 01/06/2017, 4:56 PM

## 2017-01-06 NOTE — Social Work (Signed)
CSW met with daughter at bedside to discuss bed offers.  Family wanted to see if [facility], [name] and [name] can offer a bed. [name] has offered a bed. CSW left msg for admissions at [facility]. CSW left message on [facility] admissions voicemail and CSW will f/u.  Elissa Hefty, LCSW Clinical Social Worker [phone]

## 2017-01-07 ENCOUNTER — Inpatient Hospital Stay (HOSPITAL_COMMUNITY): Payer: PPO

## 2017-01-07 DIAGNOSIS — E876 Hypokalemia: Secondary | ICD-10-CM

## 2017-01-07 LAB — COMPREHENSIVE METABOLIC PANEL
ALBUMIN: 1.7 g/dL — AB (ref 3.5–5.0)
ALK PHOS: 119 U/L (ref 38–126)
ALT: 65 U/L — ABNORMAL HIGH (ref 17–63)
ANION GAP: 8 (ref 5–15)
AST: 54 U/L — ABNORMAL HIGH (ref 15–41)
BUN: 20 mg/dL (ref 6–20)
CALCIUM: 7.5 mg/dL — AB (ref 8.9–10.3)
CO2: 27 mmol/L (ref 22–32)
Chloride: 102 mmol/L (ref 101–111)
Creatinine, Ser: 0.76 mg/dL (ref 0.61–1.24)
GFR calc non Af Amer: >60 mL/min (ref >60)
Glucose, Bld: 109 mg/dL — ABNORMAL HIGH (ref 65–99)
POTASSIUM: 3.1 mmol/L — AB (ref 3.5–5.1)
SODIUM: 137 mmol/L (ref 135–145)
Total Bilirubin: 3.2 mg/dL — ABNORMAL HIGH (ref 0.3–1.2)
Total Protein: 4.3 g/dL — ABNORMAL LOW (ref 6.5–8.1)

## 2017-01-07 LAB — CBC
HEMATOCRIT: 28.7 % — AB (ref 39.0–52.0)
Hemoglobin: 8.9 g/dL — ABNORMAL LOW (ref 13.0–17.0)
MCH: 32.2 pg (ref 26.0–34.0)
MCHC: 31 g/dL (ref 30.0–36.0)
MCV: 104 fL — ABNORMAL HIGH (ref 78.0–100.0)
Platelets: 77 10*3/uL — ABNORMAL LOW (ref 150–400)
RBC: 2.76 MIL/uL — AB (ref 4.22–5.81)
RDW: 23 % — ABNORMAL HIGH (ref 11.5–15.5)
WBC: 4.7 10*3/uL (ref 4.0–10.5)

## 2017-01-07 LAB — GLUCOSE, CAPILLARY
GLUCOSE-CAPILLARY: 108 mg/dL — AB (ref 65–99)
GLUCOSE-CAPILLARY: 129 mg/dL — AB (ref 65–99)
Glucose-Capillary: 152 mg/dL — ABNORMAL HIGH (ref 65–99)

## 2017-01-07 MED ORDER — VANCOMYCIN 50 MG/ML ORAL SOLUTION
125.0000 mg | Freq: Four times a day (QID) | ORAL | Status: DC
  Administered 2017-01-07 – 2017-01-15 (×31): 125 mg via ORAL
  Filled 2017-01-07 (×35): qty 2.5

## 2017-01-07 MED ORDER — POTASSIUM CHLORIDE CRYS ER 20 MEQ PO TBCR
40.0000 meq | EXTENDED_RELEASE_TABLET | Freq: Two times a day (BID) | ORAL | Status: AC
  Administered 2017-01-07 (×2): 40 meq via ORAL
  Filled 2017-01-07 (×2): qty 2

## 2017-01-07 NOTE — Progress Notes (Signed)
Pharmacy Antibiotic Note  Danny Oneill is a 83 y.o. male admitted on 12/03/2016 with C diff colitis.  Pharmacy has been consulted for vancomycin oral dosing. Tested positive per C diff PCR on 12/16/16 and completed vancomycin oral from 9/12 to 9/27. Patient continued to still have ongoing diarrhea. WBC within normal limits and patient is afebrile. Primary team and ID agreed to pulse oral vancomycin taper.  Plan:  1) Start vancomycin taper at 125 mg four times daily  2) Will enter vancomycin taper orders listed below as indicated based on day of therapy  Vancomycin Pulse Taper  125 mg four times daily for 10-14 days then 125 mg twice daily for 7 days then 125 mg once daily for 7 days then 125 mg every other day for 7 days then 125 mg every 3 days for 2-8 weeks  Height: 6\' 1"  (185.4 cm) Weight: 219 lb 2.2 oz (99.4 kg) IBW/kg (Calculated) : 79.9  Temp (24hrs), Avg:97.6 F (36.4 C), Min:97.5 F (36.4 C), Max:97.7 F (36.5 C)   Recent Labs Lab 01/02/17 0207 01/03/17 0228 01/05/17 0459 01/07/17 0530  WBC 12.6* 9.6 6.8 4.7  CREATININE 0.67 0.72 0.76 0.76    Estimated Creatinine Clearance: 86.8 mL/min (by C-G formula based on SCr of 0.76 mg/dL).    Allergies  Allergen Reactions  . Amiodarone Rash  . Morphine And Related Other (See Comments)    Confusion/ hallucinations     Antimicrobials this admission: Vancomycin PO 10/4 >>   Microbiology results: 9/22 BCx: NGTD 9/30 UCx: NG  9/12 C Diff PCR: positive   Thank you for allowing pharmacy to be a part of this patient's care.  Girard Cooter, PharmD Clinical Pharmacist  Phone: [phone] 01/07/2017 4:12 PM

## 2017-01-07 NOTE — Progress Notes (Addendum)
Progress Note    Danny Oneill  GGE:[id number] DOB: Aug 03, 1931  DOA: 12/03/2016 PCP: Burnard Bunting, MD    Brief Narrative:   Chief complaint: F/U sepsis, hip fracture  Medical records reviewed and are as summarized below:  Danny Oneill is an 83 y.o. male with a PMH of asbestosis, CAD, chronic CHF, first-degree AV block, PAF, HLD, bladder outlet obstruction and stage III CKD who was admitted for treatment of a hip fracture status post mechanical fall.  Significant Events: 8/30 admit after mechanical fall > hip fx  9/4 intubated - S/P LEFT intertrochantric intramedullary nailing - tx to ICU for peri-op hypotension 9/6 extubated 9/8 [place] - negative gallstones, slight sludge in the gallbladder and there is slight diffuse thickening of the gallbladder wall. Negative sonographic Murphy's sign. 9/10 GI, Surgery, and IR consulted  9/11 cholecystostomy tubeplaced 9/21 IR cappedpercutaneous cholecystostomy drain 9/25 Palliative Care met w/ family  9/28 Cards consulted at family request for edema  9/30 transfer to Ortho floor - MS improving    Assessment/Plan:   Principal Problem:   Closed left hip fracture ([place]) S/P LEFT intertrochanteric intramedullary nailing 12/08/16 - transferred to ortho floor 01/03/17 to allow better access to PT/OT needs. Will likely need rehab when medially stable. Seen PT working with him today and requires lots of assistance. SNF at discharge. Physically deconditioned.Clinical social work following and thus far no SNF will take patient with Flexi seal in place.  Active Problems: Oral candidiasis Completed 10 day course of Diflucan. Also on MMW.  Acute metabolic encephalopathy. Multifactorial:  septic shock, high dose steroids, hypernatremia, mild uremia, sleep deprivation. limitsedatives and narcotics as able.  Mental status has improved, but continues to wax and wane.  Still a bit lethargic at times with poor activity tolerance. Zyprexa discontinued. As  per discussion with patient's son on 10/4, mental status significantly improved compared to 2-3 days ago and close to baseline.  Septic shock due to E coli UTI Shock resolved.  Follow-up blood and urine cultures were negative. Has completed his course of antibiotics.  C diff colitis Completed 2 week course PO Vancomycin on 2/26. Ongoing diarrhea - continue flexiseal as the patient still has diarrhea. Added probiotics. I discussed with infectious disease ID on call Dr. Michel Bickers who recommended pulse oral vancomycin taper as follows, started 01/07/17.  ? 125 mg four times daily for 10 to 14 days, followed by ? 125 mg twice daily for 7 days, followed by ? 125 mg once daily for 7 days, followed by ? 125 mg every other day for 7 days, followed by ? 125 mg every 3 days for 2 to 8 weeks   Relative Adrenal insufficiency / Hypotension Was given a course of stress dose steroids - remains hemodynamically stable on midodrine. Coreg, Lisinopril remain on hold.  Acute Acalculous Colecystitis  Abdominal US noted gallbladder sludge/diffuse thickening - +Murphy sign on exam - 12/15/16  S/P percutaneous cholecystostomy drain > capped 12/25/16 w/ IR directing care of this issue. Diet advanced but eating little.  Elevated LFTs and ammonialevel LFTs and Tbil now beginning to significantly trend downward - recheck intermittently. Lipitor on hold. LFTs improving.  Acute respiratory failure with hypoxia / Chronic Asbestosis Resolved w/ pt now on RA.   Chronic Systolic and Diastolic CHF/CAD/H/O MI S/P DES 02/2016. TTE 05/14/16 EF45-50% w/ grade 1 diastolic dysfunction, and repeat this admit essentially unchanged - anasarca is primarily due to low albumin creating the scenario of intravascular volume depletion despite total body  volume overload - he remains stable w/o significant pulmonary edema - continue low dose diuretic and follow. Family continues to be anxious about anasarca. Reassured that it may  take awhile to resolve. Continue ASA. Brilinta on hold.  Obesity/Moderate protein calorie malnutrition Body mass index is 28.91 kg/m. Continue nutritional supplements.   Paroxysmal atrial fibrillation Remains in NSR. DC telemetry 10/4.  Acute renal failure  Resolved with creatinine now normal.  Dysphagia SLP following - family agree the pt would NOT want a PEG - Cortrak now out and pt on a modified diet which he appears to be tolerating well thus far. Speech therapy follow-up appreciated and recommend dysphagia 3 diet and nectar thickened liquids. As per family, appetite slowly improving.  Hypernatremia Corrected w/ free water via IV - cautiously dosing w/ low dose lasix as discussed above . Resolved. Discontinued D5W.  Hypoglycemia No hx of DM - appears to be improving - cont CBG checks only w/ no insulin - attempt to slowly wean off IV dextrose. Should improve as oral intake improves. No hypoglycemia in the last few days. Discontinued D5W and monitor closely.  Peripheral edema Due to above noted issues - pt having persisting neuropathic type pain in R foot since UNNA boot placed - UNNA discontinued 01/03/17.  Avoid neurontin or lyrica due to high risk of AMS.  BPH Flomax 0.4 mg daily. Still has Foley catheter. Consider outpatient urology follow-up versus DC Foley catheter close to discharge and check for voiding.  Anemia Stable. Likely secondary to acute illness and chronic disease.  Thrombocytopenia - Likely due to acute illness and antimicrobials. Platelets have trended down to 77.? Related to C. difficile. Does not seem to have received heparin during this admission. Follow CBC in a.m. and if continues to drop then may need further evaluation.  Hypokalemia - Secondary to diarrhea. Replace and follow.    Family Communication/Anticipated D/C date and plan/Code Status   DVT prophylaxis: SCDs ordered. Code Status: DNR Family Communication: Discussed in detail with  patient's Son at bedside. Disposition Plan: SNF when medically stable. Still tenuous.   Medical Consultants:    IR  PCCM  Orthopedics  GI  Palliative Care   Cardiology    Anti-Infectives:    Diflucan 12/27/16--->  Oral Vanc 9/18 (effective) --->  Subjective:   Alert and oriented 2. Recognizes his son at bedside. Able to correctly tell the number of kids he has. As per son, much improved appetite this morning and was asking for a sandwich. Ate all of his breakfast. As per RN, diarrhea persists.  Objective:    Vitals:   01/06/17 1430 01/06/17 2047 01/07/17 0500 01/07/17 1315  BP: 98/63 95/60 (!) 86/53 (!) 99/59  Pulse: 67 78 (!) 59 66  Resp: _0 Temp: (!) 97.5 F (36.4 C) 97.6 F (36.4 C) (!) 97.5 F (36.4 C) 97.7 F (36.5 C)  TempSrc: Axillary Axillary Axillary Axillary  SpO2: 100% 92% 99% 97%  Weight:   99.4 kg (219 lb 2.2 oz)   Height:        Intake/Output Summary (Last 24 hours) at 01/07/17 1558 Last data filed at 01/07/17 1431  Gross per 24 hour  Intake              520 ml  Output             1600 ml  Net            -1080 ml   [employer]  01/05/17 0700 01/06/17 0500 01/07/17 0500  Weight: 93 kg (205 lb 1.6 oz) 98.2 kg (216 lb 7.9 oz) 99.4 kg (219 lb 2.2 oz)    Exam:  General: Pleasant elderly male, moderately built, frail, lying comfortably propped up in bed. Looks much better than he did yesterday. Cardiovascular: S1 and S2 heard, RRR. No JVD or murmurs. Trace bilateral leg edema. Telemetry: Low voltage, lots of tremor artifacts, sinus rhythm. Stable without change. Lungs: Diminished breath sounds in the bases but no wheezing, rhonchi or crackles. Rest of lung Divita clear to auscultation. No increased work of breathing. Stable without change. Abdomen: Soft, nontender, nondistended with normal active bowel sounds. No masses. No hepatosplenomegaly. Stable without change. Flexi seal with liquid stools. Skin: Warm and dry. No rashes or  lesions. Extremities: No clubbing or cyanosis. Pedal pulses 2+. CNS: Alert and oriented 2. No focal deficits.   Data Reviewed:   I have personally reviewed following labs and imaging studies:  Labs: Labs reviewed 01/05/17 show the following:   Basic Metabolic Panel:  Recent Labs Lab 01/02/17 0207 01/03/17 0228 01/05/17 0459 01/07/17 0530  NA 136 135 136 137  K 4.5 4.2 4.0 3.1*  CL 107 105 102 102  CO2 _0 GLUCOSE 91 111* 88 109*  BUN 40* 31* 21* 20  CREATININE 0.67 0.72 0.76 0.76  CALCIUM 7.4* 7.4* 7.4* 7.5*   GFR Estimated Creatinine Clearance: 86.8 mL/min (by C-G formula based on SCr of 0.76 mg/dL). Liver Function Tests:  Recent Labs Lab 01/02/17 0207 01/05/17 0459 01/07/17 0530  AST 80* 65* 54*  ALT 110* 80* 65*  ALKPHOS 158* 152* 119  BILITOT 4.7* 3.9* 3.2*  PROT 4.8* 4.3* 4.3*  ALBUMIN 2.0* 1.8* 1.7*   No results for input(s): AMMONIA in the last 168 hours. CBC:  Recent Labs Lab 01/02/17 0207 01/03/17 0228 01/05/17 0459 01/07/17 0530  WBC 12.6* 9.6 6.8 4.7  HGB 10.8* 10.5* 9.6* 8.9*  HCT 35.1* 34.5* 30.7* 28.7*  MCV 105.7* 105.5* 103.0* 104.0*  PLT 111* 115* 103* 77*   CBG:  Recent Labs Lab 01/06/17 1129 01/06/17 1639 01/06/17 2207 01/07/17 0734 01/07/17 1142  GLUCAP 128* 116* 116* 108* 129*   Microbiology No results found for this or any previous visit (from the past 240 hour(s)).  Procedures and diagnostic studies:  No results found.  Medications:   . aspirin  81 mg Oral Daily  . chlorhexidine  15 mL Mouth Rinse BID  . Chlorhexidine Gluconate Cloth  6 each Topical Daily  . feeding supplement (ENSURE ENLIVE)  237 mL Oral TID WC  . furosemide  20 mg Intravenous Daily  . magic mouthwash  5 mL Oral TID  . mouth rinse  15 mL Mouth Rinse q12n4p  . midodrine  10 mg Oral TID WC  . potassium chloride  40 mEq Oral BID WC  . saccharomyces boulardii  250 mg Oral BID  . tamsulosin  0.4 mg Oral Daily   Continuous  Infusions:    LOS: 36 days   Lavone Weisel, MD, FACP, FHM. [facility] Pager [phone]  If 7PM-7AM, please contact night-coverage [email or web address] Password [email or web address] 01/07/2017, 3:58 PM

## 2017-01-07 NOTE — Evaluation (Signed)
Modified Barium Swallow Progress Note  Patient Details  Name: Danny Oneill MRN: [id number] Date of Birth: [birth year]  Today's Date: 01/07/2017  Modified Barium Swallow completed.  Full report located under Chart Review in the Imaging Section.  Brief recommendations include the following:  Clinical Impression  Pt presents with moderate oropharyngeal dysphagia characterized by decreased base of tongue retraction, delayed swallow initiation, decreased epiglottic deflection and residue in the vallecula and pyriform sinus. Residue in the vallecula caused by decreased base of tongue retraction resulting in subsequent pooling in the pyriform sinus. Silent aspiration was noted with thin liquids; pt elicited throat clear given minimal verbal cueing which proved to be ineffective for clearing aspirate. Chin tuck was attempted and was ineffective for clearing vallecular residue with both thin liquid and nectar thick liquids. Hard, effortful swallow was effective in increasing base of tongue retraction, decreasing residue and improving duration of the swallow. Risk of aspiration will persist even with nectar-thick liquids; pt's family is informed and is aware of ongoing aspiration risk. Upgrade diet to Dys 3 (mech soft) diet with nectar thick liquids and meds crushed in puree. Suspect that swallow function will imrove as pt's medical status progresses. Will continue to monitor.   Swallow Evaluation Recommendations       SLP Diet Recommendations: Dysphagia 3 (Mech soft) solids;Nectar thick liquid   Liquid Administration via: Cup;Straw   Medication Administration: Crushed with puree   Supervision: Staff to assist with self feeding;Full supervision/cueing for compensatory strategies   Compensations: Slow rate;Small sips/bites;Effortful swallow;Clear throat intermittently   Postural Changes: Seated upright at 90 degrees   Oral Care Recommendations: Oral care BID        Carmela Rima, Student  SLP 01/07/2017,2:50 PM

## 2017-01-07 NOTE — Social Work (Addendum)
CSW called daughter and discussed SNF bed offers. Daughter has selected [name].  CSW called Peggy in admissions to advise of bed acceptance. CSW left a msg requesting a call back.   CSW needs to confirm if SNF will take patient with flexiseal and patient will need air mattress.  CSW called [employer] to initiate insurance auth.  CSw left message for nurse coordinator to call back.  CSW will f/u.  [name], LCSW Clinical Social Worker [phone]

## 2017-01-07 NOTE — Progress Notes (Signed)
   Subjective:  Patient reports pain as mild to moderate.  Doing much better.  Has not been out of bed much yet.   Denies leg pain or issues with wounds.  Objective:   VITALS:   Vitals:   01/06/17 0500 01/06/17 1430 01/06/17 2047 01/07/17 0500  BP: (!) 96/52 98/63 95/60  (!) 86/53  Pulse: 77 67 78 (!) 59  Resp: 15 16 17 16   Temp: 97.6 F (36.4 C) (!) 97.5 F (36.4 C) 97.6 F (36.4 C) (!) 97.5 F (36.4 C)  TempSrc: Oral Axillary Axillary Axillary  SpO2: 95% 100% 92% 99%  Weight: 98.2 kg (216 lb 7.9 oz)   99.4 kg (219 lb 2.2 oz)  Height:        Neurologically intact Neurovascular intact Sensation intact distally Intact pulses distally Incision: c/d/i Still with pittiing edema up to thigh and mild jaundice, improved  Very weak, but intact TA/GSC/EHL/FHL  Lab Results  Component Value Date   WBC 4.7 01/07/2017   HGB 8.9 (L) 01/07/2017   HCT 28.7 (L) 01/07/2017   MCV 104.0 (H) 01/07/2017   PLT 77 (L) 01/07/2017   BMET    Component Value Date/Time   NA 137 01/07/2017 0530   NA 132 (A) 02/17/2016   K 3.1 (L) 01/07/2017 0530   CL 102 01/07/2017 0530   CO2 27 01/07/2017 0530   GLUCOSE 109 (H) 01/07/2017 0530   BUN 20 01/07/2017 0530   BUN 37 (A) 02/17/2016   CREATININE 0.76 01/07/2017 0530   CREATININE 1.25 (H) 03/16/2016 1229   CALCIUM 7.5 (L) 01/07/2017 0530   GFRNONAA >60 01/07/2017 0530   GFRAA >60 01/07/2017 0530     Assessment/Plan: 30 Days Post-Op   Principal Problem:   Closed left hip fracture (HCC) Active Problems:   Hyperlipidemia   CAD (coronary artery disease)   CKD (chronic kidney disease), stage III (HCC)   Hip fracture (HCC)   Compromised airway   Hypovolemic shock (HCC)   Elevated troponin I level   Chronic combined systolic and diastolic heart failure (HCC)   Acute respiratory failure (HCC)   Elevated LFTs   Pressure injury of skin   Up with therapy 50% WB to LLE Dry dressing prn DVT ppx per primary with SCDs BLE Will get updated  xray in 1 week if patient still here, otherwise follow up 2 weeks after DC   Yolonda Kida 01/07/2017, 9:34 AM   Maryan Rued, MD [phone]

## 2017-01-07 NOTE — Social Work (Signed)
CSW received a call back from [name] and they will not have a bed until next Tuesday.  CSW then left another message for staff at [name] for bed availability.  CSW will meet with family to select a SNF as they have other bed offers available.  [name], LCSW Clinical Social Worker [phone]

## 2017-01-07 NOTE — Social Work (Signed)
CSW spoke to [place] at [name] and she advised that they cannot take patient with a flexiseal/rectal balloon. They do have a private room and can obtain the air mattress but will not take patient with flexiseal.  CSW then discussed with family and they indicated that their additional choices for SNF: [facility], [name], [facility].  CSW then called [facility] and they will f/u as they indicated that most times, the patient is not released from hospital with the Flexiseal/rectal balloon.  CSW will await f/u.  [name], LCSW Clinical Social Worker [phone]

## 2017-01-08 LAB — BASIC METABOLIC PANEL
Anion gap: 7 (ref 5–15)
BUN: 19 mg/dL (ref 6–20)
CO2: 23 mmol/L (ref 22–32)
CREATININE: 0.75 mg/dL (ref 0.61–1.24)
Calcium: 7.4 mg/dL — ABNORMAL LOW (ref 8.9–10.3)
Chloride: 105 mmol/L (ref 101–111)
GFR calc Af Amer: >60 mL/min (ref >60)
Glucose, Bld: 84 mg/dL (ref 65–99)
Potassium: 4.7 mmol/L (ref 3.5–5.1)
SODIUM: 135 mmol/L (ref 135–145)

## 2017-01-08 LAB — GLUCOSE, CAPILLARY
GLUCOSE-CAPILLARY: 184 mg/dL — AB (ref 65–99)
Glucose-Capillary: 84 mg/dL (ref 65–99)
Glucose-Capillary: 125 mg/dL — ABNORMAL HIGH (ref 65–99)

## 2017-01-08 LAB — MAGNESIUM: Magnesium: 1.6 mg/dL — ABNORMAL LOW (ref 1.7–2.4)

## 2017-01-08 LAB — CBC
HCT: 36.1 % — ABNORMAL LOW (ref 39.0–52.0)
Hemoglobin: 11 g/dL — ABNORMAL LOW (ref 13.0–17.0)
MCH: 31.8 pg (ref 26.0–34.0)
MCHC: 30.5 g/dL (ref 30.0–36.0)
MCV: 104.3 fL — ABNORMAL HIGH (ref 78.0–100.0)
PLATELETS: 73 10*3/uL — AB (ref 150–400)
RBC: 3.46 MIL/uL — ABNORMAL LOW (ref 4.22–5.81)
RDW: 22.7 % — ABNORMAL HIGH (ref 11.5–15.5)
WBC: 7.8 10*3/uL (ref 4.0–10.5)

## 2017-01-08 MED ORDER — MAGNESIUM SULFATE 2 GM/50ML IV SOLN
2.0000 g | Freq: Once | INTRAVENOUS | Status: AC
  Administered 2017-01-09: 2 g via INTRAVENOUS
  Filled 2017-01-08 (×2): qty 50

## 2017-01-08 NOTE — Progress Notes (Addendum)
Physical Therapy Treatment Patient Details Name: Danny Oneill MRN: [id number] DOB: 1931/10/17 Today's Date: 01/08/2017    History of Present Illness Pt is an 83 yo male with PMH of HLD, lumbar spondylosis, MI, CAD, PAF, cardiomyopathy, CKD, CHF who was admitted on 8/30 with L hip fracture. He developed hypoxia and hypotension after surgery (L IM nail) on 9/4 and required re-intubation 9/4-9/6. Pt with septic shock from UTI, then found to have c diff, also with acute acaclulous colecystitis s/p perc drain 9/11.   PMH positive for MI, cardiomyopathy, CHF, first degree AV block, PAF, CKD III, bladder outlet obstruction, lumbar spondylosis, and TKA.   PT Comments    Pt with increased lethargy this treatment session, limiting ability to participate. TotalA(+2) for rolling in bed to place maxisky pad; lift from bed to chair. Once in chair, pt more interactive with therapists, but remained unwilling to participate with seated therex or mobility. Goals updated to reflect pt's progress. Will continue to follow acutely.   Follow Up Recommendations  SNF     Equipment Recommendations  Other (comment) (TBD)    Recommendations for Other Services       Precautions / Restrictions Precautions Precautions: Fall Precaution Comments: rectal tube,  c-diff Restrictions Weight Bearing Restrictions: Yes LLE Weight Bearing: Partial weight bearing LLE Partial Weight Bearing Percentage or Pounds: 50    Mobility  Bed Mobility Overal bed mobility: Needs Assistance Bed Mobility: Rolling Rolling: Total assist;+2 for physical assistance         General bed mobility comments: Pt unwilling/unable to participate with bed mobility secondary to lethary; totalA to roll for maxisky pad placement. Once sitting in chair, pt unwilling to lean forward or lift any extremities for therapists or daughter  Transfers Overall transfer level: Needs assistance               General transfer comment: Bed to chair via  maxisky lift  Ambulation/Gait                 Stairs            Wheelchair Mobility    Modified Rankin (Stroke Patients Only)       Balance Overall balance assessment: Needs assistance Sitting-balance support: Bilateral upper extremity supported;Feet supported Sitting balance-Leahy Scale: Poor   Postural control: Posterior lean                                  Cognition Arousal/Alertness: Lethargic Behavior During Therapy: Flat affect Overall Cognitive Status: Impaired/Different from baseline Area of Impairment: Attention;Following commands;Awareness;Problem solving                   Current Attention Level: Sustained   Following Commands: Follows one step commands inconsistently     Problem Solving: Slow processing;Requires verbal cues;Decreased initiation General Comments: Pt with increased lethargy (daughter suspects secondary to medication); daughter reports he was sitting up in bed eating this morning, but has been sleeping all afternoon. Required max cues and painful stimuli to awake, becoming more awake when transferred to share. Not sure if lack of command following and problem solving was due to inability or unwillingness      Exercises      General Comments        Pertinent Vitals/Pain Pain Assessment: Faces Faces Pain Scale: Hurts a little bit Pain Location: Left leg Pain Descriptors / Indicators: Grimacing Pain Intervention(s): Monitored during session;Repositioned  Home Living                      Prior Function            PT Goals (current goals can now be found in the care plan section) Acute Rehab PT Goals Patient Stated Goal: Per sister to return home with assistance PT Goal Formulation: With patient/family Time For Goal Achievement: 01/08/17 Potential to Achieve Goals: Poor Progress towards PT goals: Goals downgraded   Frequency    Min 2X/week      PT Plan Current plan remains  appropriate    Co-evaluation              AM-PAC PT "6 Clicks" Daily Activity  Outcome Measure  Difficulty turning over in bed (including adjusting bedclothes, sheets and blankets)?: Unable Difficulty moving from lying on back to sitting on the side of the bed? : Unable Difficulty sitting down on and standing up from a chair with arms (e.g., wheelchair, bedside commode, etc,.)?: Unable Help needed moving to and from a bed to chair (including a wheelchair)?: Total Help needed walking in hospital room?: Total Help needed climbing 3-5 steps with a railing? : Total 6 Click Score: 6    End of Session Equipment Utilized During Treatment: Gait belt Activity Tolerance: Patient limited by lethargy Patient left: in bed;with call bell/phone within reach;with family/visitor present Nurse Communication: Mobility status;Need for lift equipment PT Visit Diagnosis: Muscle weakness (generalized) (M62.81);Difficulty in walking, not elsewhere classified (R26.2);Other symptoms and signs involving the nervous system (R29.898);Pain Pain - Right/Left: Left Pain - part of body: Hip     Time: 1400-1430 PT Time Calculation (min) (ACUTE ONLY): 30 min  Charges:  $Therapeutic Activity: 23-37 mins                    G Codes:      Ina Homes, PT, DPT Acute Rehab Services  Pager: [phone]  Malachy Chamber 01/08/2017, 5:06 PM

## 2017-01-08 NOTE — Progress Notes (Signed)
Progress Note    Danny Oneill  GPQ:[id number] DOB: [birth year]  DOA: 12/03/2016 PCP: Burnard Bunting, MD    Brief Narrative:   Chief complaint: F/U sepsis, hip fracture  Medical records reviewed and are as summarized below:  Danny Oneill is an 83 y.o. male with a PMH of asbestosis, CAD, chronic CHF, first-degree AV block, PAF, HLD, bladder outlet obstruction and stage III CKD who was admitted for treatment of a hip fracture status post mechanical fall.  Significant Events: 8/30 admit after mechanical fall > hip fx  9/4 intubated - S/P LEFT intertrochantric intramedullary nailing - tx to ICU for peri-op hypotension 9/6 extubated 9/8 [place] - negative gallstones, slight sludge in the gallbladder and there is slight diffuse thickening of the gallbladder wall. Negative sonographic Murphy's sign. 9/10 GI, Surgery, and IR consulted  9/11 cholecystostomy tubeplaced 9/21 IR cappedpercutaneous cholecystostomy drain 9/25 Palliative Care met w/ family  9/28 Cards consulted at family request for edema  9/30 transfer to Ortho floor - MS improving    Assessment/Plan:   Principal Problem:   Closed left hip fracture ([place]) S/P LEFT intertrochanteric intramedullary nailing 12/08/16 - transferred to ortho floor 01/03/17 to allow better access to PT/OT needs. Will likely need rehab when medially stable. Seen PT working with him today and requires lots of assistance. SNF at discharge. Physically deconditioned.Clinical social work following and thus far no SNF will take patient with Flexi seal in place.  Active Problems: Oral candidiasis Completed 10 day course of Diflucan. Also on MMW.  Acute metabolic encephalopathy. Multifactorial:  septic shock, high dose steroids, hypernatremia, mild uremia, sleep deprivation. limitsedatives and narcotics as able.  Mental status has improved, but continues to wax and wane.  Still a bit lethargic at times with poor activity tolerance. Zyprexa discontinued. As  per discussion with family yesterday and this morning, mental status has steadily improved over the last couple of days. However subsequently this afternoon, RN reported that patient was lethargic but had stable vital signs, respiration, no hypoxia-may be due to fatigue of activity since this morning. Patient not on any medications that should contribute to his lethargy. No focal deficits. Continue to monitor. No new interventions available.  Septic shock due to E coli UTI Shock resolved.  Follow-up blood and urine cultures were negative. Has completed his course of antibiotics.  C diff colitis Completed 2 week course PO Vancomycin on 2/26. Ongoing diarrhea - continue flexiseal as the patient still has diarrhea. Added probiotics. I discussed with infectious disease ID on call Dr. Michel Bickers who recommended pulse oral vancomycin taper as follows, started 01/07/17.  ? 125 mg four times daily for 10 to 14 days, followed by ? 125 mg twice daily for 7 days, followed by ? 125 mg once daily for 7 days, followed by ? 125 mg every other day for 7 days, followed by ? 125 mg every 3 days for 2 to 8 weeks   As per family and RN, stools seem to be more formed. Continue to monitor.  Relative Adrenal insufficiency / Hypotension Was given a course of stress dose steroids - remains hemodynamically stable on midodrine. Coreg, Lisinopril remain on hold.  Acute Acalculous Colecystitis  Abdominal US noted gallbladder sludge/diffuse thickening - +Murphy sign on exam - 12/15/16  S/P percutaneous cholecystostomy drain > capped 12/25/16 w/ IR directing care of this issue. Appetite is reportedly improving.  Elevated LFTs and ammonialevel LFTs and Tbil now beginning to significantly trend downward - recheck intermittently. Lipitor on  hold. LFTs improving.  Acute respiratory failure with hypoxia / Chronic Asbestosis Resolved w/ pt now on RA.   Chronic Systolic and Diastolic CHF/CAD/H/O MI S/P DES 02/2016.  TTE 05/14/16 EF45-50% w/ grade 1 diastolic dysfunction, and repeat this admit essentially unchanged - anasarca is primarily due to low albumin creating the scenario of intravascular volume depletion despite total body volume overload - he remains stable w/o significant pulmonary edema - continue low dose diuretic and follow. Family continues to be anxious about anasarca. Reassured that it may take awhile to resolve. Continue ASA. Brilinta on hold. Patient still has significant volume overload/anasarca but cannot increase diuretics due to borderline blood pressures.  Obesity/Moderate protein calorie malnutrition Body mass index is 29.06 kg/m. Continue nutritional supplements.   Paroxysmal atrial fibrillation Remains in NSR. DC telemetry 10/4.  Acute renal failure  Resolved with creatinine now normal.  Dysphagia SLP following - family agree the pt would NOT want a PEG - Cortrak now out and pt on a modified diet which he appears to be tolerating well thus far. Speech therapy follow-up appreciated and recommend dysphagia 3 diet and nectar thickened liquids. As per family, appetite slowly improving.  Hypernatremia Corrected w/ free water via IV - cautiously dosing w/ low dose lasix as discussed above . Resolved. Discontinued D5W.  Hypoglycemia No hx of DM - appears to be improving - cont CBG checks only w/ no insulin - attempt to slowly wean off IV dextrose. Should improve as oral intake improves. Discontinued D5W and monitor closely. No further hypoglycemia.  Peripheral edema Due to above noted issues - pt having persisting neuropathic type pain in R foot since UNNA boot placed - UNNA discontinued 01/03/17.  Avoid neurontin or lyrica due to high risk of AMS.  BPH Flomax 0.4 mg daily. Still has Foley catheter. As per family, this was placed during this hospitalization. Patient had issues with urinary retention in the past requiring prolonged Foley catheter but had been removed. Attempt to DC  Foley catheter in the next day or 2 and check for voiding.  Anemia Stable. Likely secondary to acute illness and chronic disease.  Thrombocytopenia - Likely due to acute illness and antimicrobials. Platelets have trended down to 77.? Related to C. difficile. Does not seem to have received heparin during this admission. Platelets counts are relatively stable over the last 2 days. Follow CBCs closely.  Hypokalemia - Secondary to diarrhea. Replaced. Replace magnesium.   Adult failure to thrive - Multifactorial secondary to advanced age, frail health, multiple significant severe comorbidities and prolonged hospitalization. Have been updating family with prognosis and likelihood of very slow recovery and high chance of recurrent decline. Palliative care team signed off 9/30 and family wished to continue rehabilitation option.   Family Communication/Anticipated D/C date and plan/Code Status   DVT prophylaxis: SCDs ordered. Code Status: DNR Family Communication: Discussed in detail with patient's daughter at bedside. Disposition Plan: SNF when medically stable. Still tenuous.   Medical Consultants:    IR  PCCM  Orthopedics  GI  Palliative Care   Cardiology    Anti-Infectives:    Diflucan 12/27/16--->  Oral Vanc 9/18 (effective) --->  Subjective:   Seen this morning. Patient's RN was attending to him. He reported feeling okay. Denied complaints. As per RN, stool per family was more formed. As per family, patient reported that he wanted to get up and walk to the bathroom.   Objective:    Vitals:   01/07/17 2046 01/08/17 0427 01/08/17 1251 01/08/17 1507  BP: 110/65 112/70 106/63 (!) 97/49  Pulse: 77 68 72 67  Resp: '15 15  15  ' Temp: 98 F (36.7 C) 97.7 F (36.5 C) (!) 97.3 F (36.3 C) 97.7 F (36.5 C)  TempSrc: Axillary Axillary Oral Oral  SpO2: 99% 97% 99% 98%  Weight:  99.9 kg (220 lb 3.8 oz)    Height:        Intake/Output Summary (Last 24 hours) at  01/08/17 1616 Last data filed at 01/08/17 1030  Gross per 24 hour  Intake              120 ml  Output             1100 ml  Net             -980 ml   Filed Weights   01/06/17 0500 01/07/17 0500 01/08/17 0427  Weight: 98.2 kg (216 lb 7.9 oz) 99.4 kg (219 lb 2.2 oz) 99.9 kg (220 lb 3.8 oz)    Exam:  General: Pleasant elderly male, moderately built, frail, lying comfortably propped up in bed. No change since yesterday.  Cardiovascular: S1 and S2 heard, RRR. No JVD or murmurs. 1+ bilateral thigh/leg edema.  Lungs: Diminished breath sounds in the bases but no wheezing, rhonchi or crackles. Rest of lung [place] clear to auscultation. No increased work of breathing. Stable without change. Abdomen: Soft, nontender, nondistended with normal active bowel sounds. No masses. No hepatosplenomegaly. Stable without change. Flexi seal with liquid stools. Skin: Warm and dry. No rashes or lesions. Extremities: No clubbing or cyanosis. Pedal pulses 2+. CNS: Alert and oriented 2. No focal deficits.   Data Reviewed:   I have personally reviewed following labs and imaging studies:  Labs: Labs reviewed 01/05/17 show the following:   Basic Metabolic Panel:  Recent Labs Lab 01/02/17 0207 01/03/17 0228 01/05/17 0459 01/07/17 0530 01/08/17 0617  NA 136 135 136 137 135  K 4.5 4.2 4.0 3.1* 4.7  CL 107 105 102 102 105  CO2 '22 25 25 27 23  ' GLUCOSE 91 111* 88 109* 84  BUN 40* 31* 21* 20 19  CREATININE 0.67 0.72 0.76 0.76 0.75  CALCIUM 7.4* 7.4* 7.4* 7.5* 7.4*  MG  --   --   --   --  1.6*   GFR Estimated Creatinine Clearance: 87 mL/min (by C-G formula based on SCr of 0.75 mg/dL). Liver Function Tests:  Recent Labs Lab 01/02/17 0207 01/05/17 0459 01/07/17 0530  AST 80* 65* 54*  ALT 110* 80* 65*  ALKPHOS 158* 152* 119  BILITOT 4.7* 3.9* 3.2*  PROT 4.8* 4.3* 4.3*  ALBUMIN 2.0* 1.8* 1.7*   No results for input(s): AMMONIA in the last 168 hours. CBC:  Recent Labs Lab 01/02/17 0207  01/03/17 0228 01/05/17 0459 01/07/17 0530 01/08/17 0617  WBC 12.6* 9.6 6.8 4.7 7.8  HGB 10.8* 10.5* 9.6* 8.9* 11.0*  HCT 35.1* 34.5* 30.7* 28.7* 36.1*  MCV 105.7* 105.5* 103.0* 104.0* 104.3*  PLT 111* 115* 103* 77* 73*   CBG:  Recent Labs Lab 01/07/17 0734 01/07/17 1142 01/07/17 2046 01/08/17 0618 01/08/17 1109  GLUCAP 108* 129* 152* 84 125*   Microbiology No results found for this or any previous visit (from the past 240 hour(s)).  Procedures and diagnostic studies:  No results found.  Medications:   . aspirin  81 mg Oral Daily  . chlorhexidine  15 mL Mouth Rinse BID  . Chlorhexidine Gluconate Cloth  6 each Topical Daily  . feeding  supplement (ENSURE ENLIVE)  237 mL Oral TID WC  . furosemide  20 mg Intravenous Daily  . magic mouthwash  5 mL Oral TID  . mouth rinse  15 mL Mouth Rinse q12n4p  . midodrine  10 mg Oral TID WC  . saccharomyces boulardii  250 mg Oral BID  . tamsulosin  0.4 mg Oral Daily  . vancomycin  125 mg Oral QID   Continuous Infusions:    LOS: 28 days   Alyan Hartline, MD, FACP, FHM. [facility] Pager [phone]  If 7PM-7AM, please contact night-coverage [email or web address] Password [email or web address] 01/08/2017, 4:16 PM

## 2017-01-08 NOTE — Progress Notes (Signed)
Physical Therapy Treatment Patient Details Name: Danny Oneill MRN: [id number] DOB: 1932-01-27 Today's Date: 01/08/2017    History of Present Illness Pt is an 83 yo Danny with PMH of HLD, lumbar spondylosis, MI, CAD, PAF, cardiomyopathy, CKD, CHF who was admitted on 8/30 with L hip fracture. He developed hypoxia and hypotension after surgery (L IM nail) on 9/4 and required re-intubation 9/4-9/6. Pt with septic shock from UTI, then found to have c diff, also with acute acaclulous colecystitis s/p perc drain 9/11.   PMH positive for MI, cardiomyopathy, CHF, first degree AV block, PAF, CKD III, bladder outlet obstruction, lumbar spondylosis, and TKA.   PT Comments    PT to assist nursing with transfer back to bed, with training on how to use maxisky lift for bed<>chair transfer. Pt with minimal interaction again this session. Requires totalA+2 for bed mobility and repositioning. Able to assist repositioning with BUEs, but does not assist with BLEs. Repositioned all four extremities in bed to encourage elevation (secondary to increased RLE swelling) and optimal resting position. Will continue to follow.   Follow Up Recommendations  SNF     Equipment Recommendations  Other (comment) (TBD)    Recommendations for Other Services       Precautions / Restrictions Precautions Precautions: Fall Precaution Comments: rectal tube,  c-diff Restrictions Weight Bearing Restrictions: Yes LLE Weight Bearing: Partial weight bearing LLE Partial Weight Bearing Percentage or Pounds: 50    Mobility  Bed Mobility Overal bed mobility: Needs Assistance Bed Mobility: Rolling Rolling: Total assist;+2 for physical assistance         General bed mobility comments: TotalA+2 to roll R/L for removal of maxisky pad. TotalA+2 to reposition in bed; pt able to move BUEs for pillow placement, but not assisting with repositioning of BLEs  Transfers Overall transfer level: Needs assistance                General transfer comment: Chair to bed via maxisky lift  Ambulation/Gait                 Stairs            Wheelchair Mobility    Modified Rankin (Stroke Patients Only)       Balance Overall balance assessment: Needs assistance Sitting-balance support: Bilateral upper extremity supported;Feet supported Sitting balance-Leahy Scale: Poor   Postural control: Posterior lean                                  Cognition Arousal/Alertness: Lethargic Behavior During Therapy: Flat affect Overall Cognitive Status: Impaired/Different from baseline Area of Impairment: Attention;Following commands;Awareness;Problem solving                   Current Attention Level: Sustained   Following Commands: Follows one step commands inconsistently     Problem Solving: Slow processing;Requires verbal cues;Decreased initiation General Comments: Pt with minimal interaction with therapy again this afternoon. Answering yes/no questions minimally. Daughter reports no change in lethargy      Exercises      General Comments        Pertinent Vitals/Pain Pain Assessment: Faces Faces Pain Scale: Hurts a little bit Pain Location: Left leg Pain Descriptors / Indicators: Grimacing Pain Intervention(s): Monitored during session;Repositioned    Home Living                      Prior Function  PT Goals (current goals can now be found in the care plan section) Acute Rehab PT Goals Patient Stated Goal: Per sister to return home with assistance PT Goal Formulation: With patient/family Time For Goal Achievement: 01/08/17 Potential to Achieve Goals: Poor Progress towards PT goals: Not progressing toward goals - comment (limited by lethargy)    Frequency    Min 2X/week      PT Plan Current plan remains appropriate    Co-evaluation              AM-PAC PT "6 Clicks" Daily Activity  Outcome Measure  Difficulty turning over in  bed (including adjusting bedclothes, sheets and blankets)?: Unable Difficulty moving from lying on back to sitting on the side of the bed? : Unable Difficulty sitting down on and standing up from a chair with arms (e.g., wheelchair, bedside commode, etc,.)?: Unable Help needed moving to and from a bed to chair (including a wheelchair)?: Total Help needed walking in hospital room?: Total Help needed climbing 3-5 steps with a railing? : Total 6 Click Score: 6    End of Session Equipment Utilized During Treatment: Gait belt Activity Tolerance: Patient limited by lethargy Patient left: in bed;with call bell/phone within reach;with family/visitor present Nurse Communication: Mobility status;Need for lift equipment PT Visit Diagnosis: Muscle weakness (generalized) (M62.81);Difficulty in walking, not elsewhere classified (R26.2);Other symptoms and signs involving the nervous system (R29.898);Pain Pain - Right/Left: Left Pain - part of body: Hip     Time: 4235-3614 PT Time Calculation (min) (ACUTE ONLY): 13 min  Charges:  $Therapeutic Activity: 8-22 mins                    G Codes:      Ina Homes, PT, DPT Acute Rehab Services  Pager: [phone]  Malachy Chamber 01/08/2017, 5:02 PM

## 2017-01-08 NOTE — Progress Notes (Signed)
  Speech Language Pathology Treatment: Dysphagia  Patient Details Name: Danny Oneill MRN: [id number] DOB: April 11, 1931 Today's Date: 01/08/2017 Time: 2119-4174 SLP Time Calculation (min) (ACUTE ONLY): 11 min  Assessment / Plan / Recommendation Clinical Impression  Observed pt with am meal. Dys 1 breakfast delivered to pt's room; however, family able to provide chicken biscuit for observation. Pt tolerated all consistencies (puree, nectar-thick liquids, Dys 3) with occasional coughing and immediate throat clearing noted. Reinforced effortful swallow and intermittent throat clearing with the pt as well as educated family on pt's recent diet upgrade and the importance of the compensatory strategies listed above. Risk of aspiration persists as medical status continues to improve. Will continue to monitor.   HPI HPI: 83 yo male with PMH of HLD, lumbar spondylosis, MI, CAD, PAF, cardiomyopathy, CKD, CHF who was admitted on 8/30 with L hip fracture. He developed hypoxia and hypotension after surgery (L IM nail) on 9/4 and required re-intubation 9/4-9/6. Pt with septic shock from UTI, then found to have c diff, also with acute acaclulous colecystitis s/p perc drain 9/11. SLP ordered for swallow evaluation as pt's alertness began to improve.      SLP Plan  Continue with current plan of care       Recommendations  Diet recommendations: Dysphagia 3 (mechanical soft);Nectar-thick liquid Liquids provided via: Cup;No straw Medication Administration: Crushed with puree Supervision: Staff to assist with self feeding Compensations: Slow rate;Small sips/bites;Effortful swallow;Clear throat intermittently Postural Changes and/or Swallow Maneuvers: Seated upright 90 degrees                Oral Care Recommendations: Oral care QID Follow up Recommendations: Skilled Nursing facility SLP Visit Diagnosis: Dysphagia, oropharyngeal phase (R13.12) Plan: Continue with current plan of care       GO                 Carmela Rima, Student SLP 01/08/2017, 9:33 AM

## 2017-01-08 NOTE — Progress Notes (Signed)
Pt daughter request re-eval due to increase lethargy. Pt harder to arouse but answers RN after a few attempts. Pt pupils reactive and equal. Pt confused with speech, but able to answer question about pain and food request. Pt sitting in chair at this time by PT team using lift. Vitals all WNL. MD Hongalgi notified. No orders given will continue to monitor. Pt cbg 125

## 2017-01-08 NOTE — Progress Notes (Signed)
Nutrition Follow-up  DOCUMENTATION CODES:   Non-severe (moderate) malnutrition in context of chronic illness  INTERVENTION:  - Continue to encourage PO intakes of meals and supplements.  - Continue Ensure Enlive BID; make sure it is thickened to nectar-thick consistency.  - RD will continue to monitor for additional nutrition-related needs.   NUTRITION DIAGNOSIS:   Malnutrition (moderate) related to chronic illness (CAD, CKD) as evidenced by mild depletion of body fat, mild depletion of muscle mass, percent weight loss (9% weight loss in 3 months). -ongoing  GOAL:   Patient will meet greater than or equal to 90% of their needs -unmet at this time  MONITOR:   PO intake, Supplement acceptance, Weight trends, Labs, Skin  ASSESSMENT:   83 yo male with PMH of HLD, lumbar spondylosis, MI, CAD, PAF, cardiomyopathy, CKD, CHF who was admitted on 8/30 with L hip fracture. He developed hypoxia and hypotension after surgery (L IM nail) on 9/4 and required re-intubation and transfer to the ICU.  10/5 Diet advanced from FLD to Dysphagia 3, nectar-thick liquids yesterday at 3:00 PM (Resource Thicken Up already ordered). Per chart review, pt consumed 25% of dinner (135 kcal, 3 grams protein) on 10/2; 5% breakfast (40 kcal, 1 gram protein), 50% of lunch (325 kcal, 11 grams protein), and 25% of dinner (160 kcal, 5 grams protein) on 103; 100% of breakfast and lunch (both 650 kcal, 22 grams protein) yesterday. Appetite and intakes have slowly improved, especially with increased options associated with diet advancement. Weight continues to fluctuate frequently; will continue to monitor. Pt has accepted nearly all of Ensure supplements offered (currently ordered TID).  Medications reviewed; 20 mg IV Lasix/day, 40 mEq oral KCl x2 doses yesterday, 250 mg Florastor BID.  Labs reviewed; Ca: 7.4 mg/dL, Mg: 1.6 mg/dL, CBG: 84 mg/dL this AM.    70/0 - Cortrak NGT out and tube feeds stopped 9/28.  - Pt is  currently on a full liquid diet with nectar thick consistency.  - Meal completion has been 25%.  - Wife at bedside has been encouraging pt to eat at meals.  - RD to order Ensure to aid in adequate nutrition.    Diet Order:  DIET DYS 3 Room service appropriate? Yes; Fluid consistency: Nectar Thick  Skin:  Wound (see comment) (Stage 2 sacral pressure injury)  Last BM:  10/4 via flexi-seal  Height:   Ht Readings from Last 1 Encounters:  12/23/16 6\' 1"  (1.854 m)    Weight:   Wt Readings from Last 1 Encounters:  01/08/17 220 lb 3.8 oz (99.9 kg)    Ideal Body Weight:  83.6 kg  BMI:  Body mass index is 29.06 kg/m.  Estimated Nutritional Needs:   Kcal:  1900-2250 kcals  Protein:  115-136 g   Fluid:  2 L  EDUCATION NEEDS:   No education needs identified at this time    Trenton Gammon, MS, RD, LDN, CNSC Inpatient Clinical Dietitian Pager # [phone] After hours/weekend pager # [phone]

## 2017-01-09 DIAGNOSIS — R627 Adult failure to thrive: Secondary | ICD-10-CM

## 2017-01-09 LAB — COMPREHENSIVE METABOLIC PANEL
ALBUMIN: 1.9 g/dL — AB (ref 3.5–5.0)
ALT: 58 U/L (ref 17–63)
AST: 47 U/L — AB (ref 15–41)
Alkaline Phosphatase: 133 U/L — ABNORMAL HIGH (ref 38–126)
Anion gap: 8 (ref 5–15)
BILIRUBIN TOTAL: 3.1 mg/dL — AB (ref 0.3–1.2)
BUN: 20 mg/dL (ref 6–20)
CO2: 29 mmol/L (ref 22–32)
Calcium: 7.7 mg/dL — ABNORMAL LOW (ref 8.9–10.3)
Chloride: 102 mmol/L (ref 101–111)
Creatinine, Ser: 0.87 mg/dL (ref 0.61–1.24)
GFR calc Af Amer: >60 mL/min (ref >60)
GFR calc non Af Amer: >60 mL/min (ref >60)
Glucose, Bld: 118 mg/dL — ABNORMAL HIGH (ref 65–99)
POTASSIUM: 4.4 mmol/L (ref 3.5–5.1)
SODIUM: 139 mmol/L (ref 135–145)
TOTAL PROTEIN: 4.7 g/dL — AB (ref 6.5–8.1)

## 2017-01-09 LAB — CBC
HEMATOCRIT: 32.9 % — AB (ref 39.0–52.0)
HEMOGLOBIN: 10.2 g/dL — AB (ref 13.0–17.0)
MCH: 32.2 pg (ref 26.0–34.0)
MCHC: 31 g/dL (ref 30.0–36.0)
MCV: 103.8 fL — ABNORMAL HIGH (ref 78.0–100.0)
Platelets: 82 10*3/uL — ABNORMAL LOW (ref 150–400)
RBC: 3.17 MIL/uL — ABNORMAL LOW (ref 4.22–5.81)
RDW: 22.4 % — AB (ref 11.5–15.5)
WBC: 7 10*3/uL (ref 4.0–10.5)

## 2017-01-09 LAB — GLUCOSE, CAPILLARY
Glucose-Capillary: 137 mg/dL — ABNORMAL HIGH (ref 65–99)
Glucose-Capillary: 143 mg/dL — ABNORMAL HIGH (ref 65–99)
Glucose-Capillary: 147 mg/dL — ABNORMAL HIGH (ref 65–99)

## 2017-01-09 LAB — MAGNESIUM: MAGNESIUM: 1.6 mg/dL — AB (ref 1.7–2.4)

## 2017-01-09 MED ORDER — MAGNESIUM SULFATE 4 GM/100ML IV SOLN
4.0000 g | Freq: Once | INTRAVENOUS | Status: AC
  Administered 2017-01-09: 4 g via INTRAVENOUS
  Filled 2017-01-09: qty 100

## 2017-01-09 NOTE — Progress Notes (Signed)
Bladder scan at 18:40 is 306cc. MD notified, will continue to monitor.

## 2017-01-09 NOTE — Progress Notes (Signed)
Pt has not voided all not using condom catheter: bladder scanned @ [facility], noted to have in. Scanned again @ 0600, noted to have in bladder. MD on call paged, currently awaiting orders.

## 2017-01-09 NOTE — Progress Notes (Signed)
Progress Note    Danny Oneill  IEP:[id number] DOB: 12-Jun-1931  DOA: 12/03/2016 PCP: Burnard Bunting, MD    Brief Narrative:   Chief complaint: F/U sepsis, hip fracture  Medical records reviewed and are as summarized below:  Danny Oneill is an 83 y.o. male with a PMH of asbestosis, CAD, chronic CHF, first-degree AV block, PAF, HLD, bladder outlet obstruction and stage III CKD who was admitted for treatment of a hip fracture status post mechanical fall.  Significant Events: 8/30 admit after mechanical fall > hip fx  9/4 intubated - S/P LEFT intertrochantric intramedullary nailing - tx to ICU for peri-op hypotension 9/6 extubated 9/8 [place] - negative gallstones, slight sludge in the gallbladder and there is slight diffuse thickening of the gallbladder wall. Negative sonographic Murphy's sign. 9/10 GI, Surgery, and IR consulted  9/11 cholecystostomy tubeplaced 9/21 IR cappedpercutaneous cholecystostomy drain 9/25 Palliative Care met w/ family  9/28 Cards consulted at family request for edema  9/30 transfer to Ortho floor - MS improving    Assessment/Plan:   Principal Problem:   Closed left hip fracture ([place]) S/P LEFT intertrochanteric intramedullary nailing 12/08/16 - transferred to ortho floor 01/03/17 to allow better access to PT/OT needs. Will likely need rehab when medially stable. Seen PT working with him today and requires lots of assistance. SNF at discharge. Physically deconditioned.Clinical social work following and thus far no SNF will take patient with Flexi seal in place.  Active Problems: Oral candidiasis Completed 10 day course of Diflucan. Also on MMW.  Acute metabolic encephalopathy. Multifactorial:  septic shock, high dose steroids, hypernatremia, mild uremia, sleep deprivation. limitsedatives and narcotics as able.  Mental status has improved, but continues to wax and wane.  Still a bit lethargic at times with poor activity tolerance. Zyprexa discontinued. As  per discussion with family yesterday and this morning, mental status has steadily improved over the last couple of days. However subsequently this afternoon, RN reported that patient was lethargic but had stable vital signs, respiration, no hypoxia-may be due to fatigue of activity since this morning. Patient not on any medications that should contribute to his lethargy. No focal deficits. Continue to monitor. No new interventions available.  Septic shock due to E coli UTI Shock resolved.  Follow-up blood and urine cultures were negative. Has completed his course of antibiotics.  C diff colitis Completed 2 week course PO Vancomycin on 2/26. Ongoing diarrhea - continue flexiseal as the patient still has diarrhea. Added probiotics. I discussed with infectious disease ID on call Dr. Michel Bickers who recommended pulse oral vancomycin taper as follows, started 01/07/17.  ? 125 mg four times daily for 10 to 14 days, followed by ? 125 mg twice daily for 7 days, followed by ? 125 mg once daily for 7 days, followed by ? 125 mg every other day for 7 days, followed by ? 125 mg every 3 days for 2 to 8 weeks   As per family and RN, stools seem to be more formed. Continue to monitor.  Relative Adrenal insufficiency / Hypotension Was given a course of stress dose steroids - remains hemodynamically stable on midodrine. Coreg, Lisinopril remain on hold.  Acute Acalculous Colecystitis  Abdominal US noted gallbladder sludge/diffuse thickening - +Murphy sign on exam - 12/15/16  S/P percutaneous cholecystostomy drain > capped 12/25/16 w/ IR directing care of this issue. Appetite is reportedly improving.  Elevated LFTs and ammonialevel LFTs and Tbil now beginning to significantly trend downward - recheck intermittently. Lipitor on  hold. LFTs improving.  Acute respiratory failure with hypoxia / Chronic Asbestosis Resolved w/ pt now on RA.   Chronic Systolic and Diastolic CHF/CAD/H/O MI S/P DES 02/2016.  TTE 05/14/16 EF45-50% w/ grade 1 diastolic dysfunction, and repeat this admit essentially unchanged - anasarca is primarily due to low albumin creating the scenario of intravascular volume depletion despite total body volume overload - he remains stable w/o significant pulmonary edema - continue low dose diuretic and follow. Family continues to be anxious about anasarca. Reassured that it may take awhile to resolve. Continue ASA. Brilinta on hold. Patient still has significant volume overload/anasarca but cannot increase diuretics due to borderline blood pressures.  Obesity/Moderate protein calorie malnutrition Body mass index is 29.06 kg/m. Continue nutritional supplements.   Paroxysmal atrial fibrillation Remains in NSR. DC telemetry 10/4.  Acute renal failure  Resolved with creatinine now normal.  Dysphagia SLP following - family agree the pt would NOT want a PEG - Cortrak now out and pt on a modified diet which he appears to be tolerating well thus far. Speech therapy follow-up appreciated and recommend dysphagia 3 diet and nectar thickened liquids. As per family, appetite slowly improving.  Hypernatremia Corrected w/ free water via IV - cautiously dosing w/ low dose lasix as discussed above . Resolved. Discontinued D5W.  Hypoglycemia No hx of DM - appears to be improving - cont CBG checks only w/ no insulin - attempt to slowly wean off IV dextrose. Should improve as oral intake improves. Discontinued D5W and monitor closely. No further hypoglycemia.  Peripheral edema Due to above noted issues - pt having persisting neuropathic type pain in R foot since UNNA boot placed - UNNA discontinued 01/03/17.  Avoid neurontin or lyrica due to high risk of AMS.  BPH/urinary retention Flomax 0.4 mg daily. Still had Foley catheter. As per family, this was placed during this hospitalization. Patient had issues with urinary retention in the past requiring prolonged Foley catheter but had been  removed.  Discontinued Foley catheter on 10/5. Followed bladder scan but no significant retention. On 10/6, family very concerned that Foley catheter was removed. Discussed in detail regarding reasons for removal, avoiding Foley if at all possible to avoid recurrent UTIs and need for antibiotics. Advised them that if patient does not void by himself then eventually may have to place a Foley back. There were agreeable and understandable. Patient then voided 600 mL on 10/6 noon time.  Anemia Stable. Likely secondary to acute illness and chronic disease.  Thrombocytopenia - Likely due to acute illness and antimicrobials. Platelets have trended down to 77.? Related to C. difficile. Does not seem to have received heparin during this admission. Platelets seem to be slightly better. Follow CBCs closely.  Hypokalemia - Secondary to diarrhea. Replaced. Replace magnesium. Follow labs.   Adult failure to thrive - Multifactorial secondary to advanced age, frail health, multiple significant severe comorbidities and prolonged hospitalization. Have been updating family with prognosis and likelihood of very slow recovery and high chance of recurrent decline. Palliative care team signed off 9/30 and family wished to continue rehabilitation option. - I discussed in detail with patient's son and spouse at bedside and advised them that patient is still quite ill, frail and has high likelihood of declining. They verbalized understanding.   Family Communication/Anticipated D/C date and plan/Code Status   DVT prophylaxis: SCDs ordered. Code Status: DNR Family Communication: Discussed in detail with patient's Son and spouse at bedside. Disposition Plan: SNF when medically stable. Still tenuous.  Medical Consultants:    IR  PCCM  Orthopedics  GI  Palliative Care   Cardiology    Anti-Infectives:    Diflucan 12/27/16--->  Oral Vanc 9/18 (effective) --->  Subjective:   Patient alert,  oriented to self and partly to place. Oriented to family at bedside. Over the last 2 days, seems to have repeatedly stated that he is "ready to go". As per RN, no acute issues.  Objective:    Vitals:   01/08/17 1251 01/08/17 1507 01/08/17 2015 01/09/17 1431  BP: 106/63 (!) 97/49 (!) 97/58 (!) 95/52  Pulse: 72 67 71 72  Resp:  '15 15 16  ' Temp: (!) 97.3 F (36.3 C) 97.7 F (36.5 C) 97.7 F (36.5 C) 97.7 F (36.5 C)  TempSrc: Oral Oral Oral Oral  SpO2: 99% 98% 98% 100%  Weight:      Height:        Intake/Output Summary (Last 24 hours) at 01/09/17 1648 Last data filed at 01/08/17 1800  Gross per 24 hour  Intake                5 ml  Output              750 ml  Net             -745 ml   Filed Weights   01/06/17 0500 01/07/17 0500 01/08/17 0427  Weight: 98.2 kg (216 lb 7.9 oz) 99.4 kg (219 lb 2.2 oz) 99.9 kg (220 lb 3.8 oz)    Exam:  General: Pleasant elderly male, moderately built, frail, lying comfortably propped up in bed. Looks a little down in spirits today. Cardiovascular: S1 and S2 heard, RRR. No JVD or murmurs. 1+ bilateral thigh/leg edema. No change. Lungs: clear to auscultation. No increased work of breathing. Stable. Abdomen: Soft, nontender, nondistended with normal active bowel sounds. No masses or bladder palpable. No hepatosplenomegaly. Stable without change. Flexi seal with liquid stools. Skin: Warm and dry. No rashes or lesions. Extremities: No clubbing or cyanosis. Pedal pulses 2+. CNS: Alert and oriented 2. No focal deficits.   Data Reviewed:   I have personally reviewed following labs and imaging studies:  Labs: Labs reviewed 01/05/17 show the following:   Basic Metabolic Panel:  Recent Labs Lab 01/03/17 0228 01/05/17 0459 01/07/17 0530 01/08/17 0617 01/09/17 0442  NA 135 136 137 135 139  K 4.2 4.0 3.1* 4.7 4.4  CL 105 102 102 105 102  CO2 '25 25 27 23 29  ' GLUCOSE 111* 88 109* 84 118*  BUN 31* 21* '20 19 20  ' CREATININE 0.72 0.76 0.76 0.75  0.87  CALCIUM 7.4* 7.4* 7.5* 7.4* 7.7*  MG  --   --   --  1.6* 1.6*   GFR Estimated Creatinine Clearance: 80 mL/min (by C-G formula based on SCr of 0.87 mg/dL). Liver Function Tests:  Recent Labs Lab 01/05/17 0459 01/07/17 0530 01/09/17 0442  AST 65* 54* 47*  ALT 80* 65* 58  ALKPHOS 152* 119 133*  BILITOT 3.9* 3.2* 3.1*  PROT 4.3* 4.3* 4.7*  ALBUMIN 1.8* 1.7* 1.9*   No results for input(s): AMMONIA in the last 168 hours. CBC:  Recent Labs Lab 01/03/17 0228 01/05/17 0459 01/07/17 0530 01/08/17 0617 01/09/17 0442  WBC 9.6 6.8 4.7 7.8 7.0  HGB 10.5* 9.6* 8.9* 11.0* 10.2*  HCT 34.5* 30.7* 28.7* 36.1* 32.9*  MCV 105.5* 103.0* 104.0* 104.3* 103.8*  PLT 115* 103* 77* 73* 82*   CBG:  Recent Labs Lab  01/07/17 2046 01/08/17 0618 01/08/17 1109 01/08/17 2017 01/09/17 1206  GLUCAP 152* 84 125* 184* 137*   Microbiology No results found for this or any previous visit (from the past 240 hour(s)).  Procedures and diagnostic studies:  No results found.  Medications:   . aspirin  81 mg Oral Daily  . chlorhexidine  15 mL Mouth Rinse BID  . Chlorhexidine Gluconate Cloth  6 each Topical Daily  . feeding supplement (ENSURE ENLIVE)  237 mL Oral TID WC  . furosemide  20 mg Intravenous Daily  . magic mouthwash  5 mL Oral TID  . mouth rinse  15 mL Mouth Rinse q12n4p  . midodrine  10 mg Oral TID WC  . saccharomyces boulardii  250 mg Oral BID  . tamsulosin  0.4 mg Oral Daily  . vancomycin  125 mg Oral QID   Continuous Infusions:    LOS: 49 days   Christian Borgerding, MD, FACP, FHM. [facility] Pager [phone]  If 7PM-7AM, please contact night-coverage [email or web address] Password [email or web address] 01/09/2017, 4:48 PM

## 2017-01-09 NOTE — Social Work (Signed)
CSW will continue to follow for disposition. Pt still has flexi seal and SNF's declined to take.  Pt will go to SNF when medically ready.  [name], LCSW Clinical Social Worker [phone]

## 2017-01-09 NOTE — Progress Notes (Signed)
Urinary output from condom cath at 1700=1000cc. Bladder scan done at 1715 showed 526cc. Pt removed condom cath and had 3 unmeasured urine occurrences while being changed.

## 2017-01-10 LAB — COMPREHENSIVE METABOLIC PANEL
ALT: 48 U/L (ref 17–63)
AST: 37 U/L (ref 15–41)
Albumin: 1.9 g/dL — ABNORMAL LOW (ref 3.5–5.0)
Alkaline Phosphatase: 132 U/L — ABNORMAL HIGH (ref 38–126)
Anion gap: 8 (ref 5–15)
BUN: 22 mg/dL — AB (ref 6–20)
CHLORIDE: 101 mmol/L (ref 101–111)
CO2: 27 mmol/L (ref 22–32)
Calcium: 7.7 mg/dL — ABNORMAL LOW (ref 8.9–10.3)
Creatinine, Ser: 0.77 mg/dL (ref 0.61–1.24)
GFR calc Af Amer: >60 mL/min (ref >60)
GFR calc non Af Amer: >60 mL/min (ref >60)
Glucose, Bld: 108 mg/dL — ABNORMAL HIGH (ref 65–99)
POTASSIUM: 4.1 mmol/L (ref 3.5–5.1)
SODIUM: 136 mmol/L (ref 135–145)
Total Bilirubin: 3.2 mg/dL — ABNORMAL HIGH (ref 0.3–1.2)
Total Protein: 4.9 g/dL — ABNORMAL LOW (ref 6.5–8.1)

## 2017-01-10 LAB — GLUCOSE, CAPILLARY
GLUCOSE-CAPILLARY: 119 mg/dL — AB (ref 65–99)
Glucose-Capillary: 101 mg/dL — ABNORMAL HIGH (ref 65–99)

## 2017-01-10 LAB — CBC
HEMATOCRIT: 33.3 % — AB (ref 39.0–52.0)
HEMOGLOBIN: 10.4 g/dL — AB (ref 13.0–17.0)
MCH: 31.9 pg (ref 26.0–34.0)
MCHC: 31.2 g/dL (ref 30.0–36.0)
MCV: 102.1 fL — AB (ref 78.0–100.0)
Platelets: 82 10*3/uL — ABNORMAL LOW (ref 150–400)
RBC: 3.26 MIL/uL — ABNORMAL LOW (ref 4.22–5.81)
RDW: 22.1 % — AB (ref 11.5–15.5)
WBC: 9 10*3/uL (ref 4.0–10.5)

## 2017-01-10 LAB — MAGNESIUM: Magnesium: 1.9 mg/dL (ref 1.7–2.4)

## 2017-01-10 MED ORDER — VANCOMYCIN 50 MG/ML ORAL SOLUTION: 125.0000 mg | ORAL | Status: DC

## 2017-01-10 MED ORDER — VANCOMYCIN 50 MG/ML ORAL SOLUTION: 125.0000 mg | Freq: Every day | ORAL | Status: DC

## 2017-01-10 MED ORDER — VANCOMYCIN 50 MG/ML ORAL SOLUTION: 125.0000 mg | Freq: Two times a day (BID) | ORAL | Status: DC

## 2017-01-10 NOTE — Progress Notes (Addendum)
Progress Note    Danny Oneill  LKH:[id number] DOB: 1931/10/31  DOA: 12/03/2016 PCP: Burnard Bunting, MD    Brief Narrative:   Chief complaint: F/U sepsis, hip fracture  Medical records reviewed and are as summarized below:  Danny Oneill is an 83 y.o. male with a PMH of asbestosis, CAD, chronic CHF, first-degree AV block, PAF, HLD, bladder outlet obstruction and stage III CKD who was admitted for treatment of a hip fracture status post mechanical fall.  Significant Events: 8/30 admit after mechanical fall > hip fx  9/4 intubated - S/P LEFT intertrochantric intramedullary nailing - tx to ICU for peri-op hypotension 9/6 extubated 9/8 [place] - negative gallstones, slight sludge in the gallbladder and there is slight diffuse thickening of the gallbladder wall. Negative sonographic Murphy's sign. 9/10 GI, Surgery, and IR consulted  9/11 cholecystostomy tubeplaced 9/21 IR cappedpercutaneous cholecystostomy drain 9/25 Palliative Care met w/ family  9/28 Cards consulted at family request for edema  9/30 transfer to Ortho floor - MS improving    Assessment/Plan:   Principal Problem:   Closed left hip fracture ([place]) S/P LEFT intertrochanteric intramedullary nailing 12/08/16 - transferred to ortho floor 01/03/17 to allow better access to PT/OT needs. Will likely need rehab when medially stable. Seen PT working with him today and requires lots of assistance. SNF at discharge. Physically deconditioned.Clinical social work following and thus far no SNF will take patient with Flexi seal in place.  Active Problems: Oral candidiasis Completed 10 day course of Diflucan. Also on MMW.  Acute metabolic encephalopathy. Multifactorial:  septic shock, high dose steroids, hypernatremia, mild uremia, sleep deprivation. limitsedatives and narcotics as able.  Zyprexa discontinued a few days ago. Her mental status significantly improved.  Septic shock due to E coli UTI Shock resolved.  Follow-up blood  and urine cultures were negative. Has completed his course of antibiotics.  C diff colitis Completed 2 week course PO Vancomycin on 2/26. Ongoing diarrhea - continue flexiseal as the patient still has diarrhea. Added probiotics. I discussed with infectious disease ID on call Dr. Michel Bickers who recommended pulse oral vancomycin taper as follows, started 01/07/17.  ? 125 mg four times daily for 10 to 14 days, followed by ? 125 mg twice daily for 7 days, followed by ? 125 mg once daily for 7 days, followed by ? 125 mg every other day for 7 days, followed by ? 125 mg every 3 days for 2 to 8 weeks   It appears that stool volume and consistency are improving. Hopefully can discontinue Flexi seal in the next day or 2.  Relative Adrenal insufficiency / Hypotension Was given a course of stress dose steroids - remains hemodynamically stable on midodrine. Coreg, Lisinopril remain on hold.  Acute Acalculous Colecystitis  Abdominal US noted gallbladder sludge/diffuse thickening - +Murphy sign on exam - 12/15/16  S/P percutaneous cholecystostomy drain > capped 12/25/16 w/ IR directing care of this issue. Appetite is reportedly improving.  Elevated LFTs and ammonialevel LFTs and Tbil now beginning to significantly trend downward - recheck intermittently. Lipitor on hold. LFTs improving.  Acute respiratory failure with hypoxia / Chronic Asbestosis Resolved w/ pt now on RA.   Chronic Systolic and Diastolic CHF/CAD/H/O MI S/P DES 02/2016. TTE 05/14/16 EF45-50% w/ grade 1 diastolic dysfunction, and repeat this admit essentially unchanged - anasarca is primarily due to low albumin creating the scenario of intravascular volume depletion despite total body volume overload - he remains stable w/o significant pulmonary edema - continue low  dose diuretic and follow. Family continues to be anxious about anasarca. Reassured that it may take awhile to resolve. Continue ASA. Brilinta on hold. Patient still has  significant volume overload/anasarca but cannot increase diuretics due to borderline blood pressures.  Obesity/Moderate protein calorie malnutrition Body mass index is 29.39 kg/m. Continue nutritional supplements.   Paroxysmal atrial fibrillation Remains in NSR. DC telemetry 10/4.  Acute renal failure  Resolved with creatinine now normal.  Dysphagia SLP following - family agree the pt would NOT want a PEG - Cortrak now out and pt on a modified diet which he appears to be tolerating well thus far. Speech therapy follow-up appreciated and recommend dysphagia 3 diet and nectar thickened liquids. As per family, appetite slowly improving.  Hypernatremia Corrected w/ free water via IV - cautiously dosing w/ low dose lasix as discussed above . Resolved. Discontinued D5W.  Hypoglycemia Resolved.  Peripheral edema Due to above noted issues - pt having persisting neuropathic type pain in R foot since UNNA boot placed - UNNA discontinued 01/03/17.  Avoid neurontin or lyrica due to high risk of AMS.  BPH/urinary retention Flomax 0.4 mg daily. Still had Foley catheter. As per family, this was placed during this hospitalization. Patient had issues with urinary retention in the past requiring prolonged Foley catheter but had been removed.  Discontinued Foley catheter on 10/5. Since then patient has been spontaneously voiding without urinary retention. Being closely monitored clinically and with bladder scan.  Anemia Stable. Likely secondary to acute illness and chronic disease.  Thrombocytopenia - Likely due to acute illness and antimicrobials. Platelets have trended down to 77.? Related to C. difficile. Does not seem to have received heparin during this admission. Platelets seem to be slightly better. Follow CBCs closely.  Hypokalemia/hypomagnesemia - Secondary to diarrhea. Replaced. Periodically follow.   Adult failure to thrive - Multifactorial secondary to advanced age, frail  health, multiple significant severe comorbidities and prolonged hospitalization. Have been updating family with prognosis and likelihood of very slow recovery and high chance of recurrent decline. Palliative care team signed off 9/30 and family wished to continue rehabilitation option. - I discussed in detail with patient's son and spouse at bedside and advised them that patient is still quite ill, frail and has high likelihood of declining. They verbalized understanding.   Family Communication/Anticipated D/C date and plan/Code Status   DVT prophylaxis: SCDs ordered. Code Status: DNR Family Communication: Discussed in detail with patient's another son at bedside. Disposition Plan: SNF when medically stable. Still tenuous.   Medical Consultants:    IR  PCCM  Orthopedics  GI  Palliative Care   Cardiology    Anti-Infectives:    Diflucan 12/27/16--->  Oral Vanc 9/18 (effective) --->  Subjective:   Alert and oriented. Asking if I know and outpatient physician whom I did not. No complaints reported. As per RN, no acute issues, voiding spontaneously without urinary retention as evidenced by bladder scan. Apparently had been out of bed to chair for a couple of hours yesterday.  Objective:    Vitals:   01/09/17 2100 01/09/17 2229 01/10/17 0608 01/10/17 1300  BP: 113/62  (!) 90/44 (!) 92/56  Pulse: 79  70 75  Resp: _0 Temp: 98.3 F (36.8 C)  97.6 F (36.4 C) 97.8 F (36.6 C)  TempSrc: Axillary  Oral Oral  SpO2: 100%  97% 97%  Weight:  101.1 kg (222 lb 12.8 oz)    Height:        Intake/Output  Summary (Last 24 hours) at 01/10/17 1428 Last data filed at 01/10/17 1100  Gross per 24 hour  Intake              240 ml  Output             2130 ml  Net            -1890 ml   Filed Weights   01/07/17 0500 01/08/17 0427 01/09/17 2229  Weight: 99.4 kg (219 lb 2.2 oz) 99.9 kg (220 lb 3.8 oz) 101.1 kg (222 lb 12.8 oz)    Exam:  General: Pleasant elderly male,  moderately built, frail, sitting up in bed and drinking fluids. Cardiovascular: S1 and S2 heard, RRR. No JVD or murmurs. 1+ bilateral thigh/leg edema, may be slightly better.  Lungs: clear to auscultation. No increased work of breathing. Stable Abdomen: Soft, nontender, nondistended with normal active bowel sounds. No masses or bladder palpable. No hepatosplenomegaly. Stable without change. Flexi seal with liquid stools-volume decreasing. Skin: Warm and dry. No rashes or lesions. Extremities: No clubbing or cyanosis. Pedal pulses 2+. Left leg with Ardelia Mems boot CNS: Alert and oriented 2. No focal deficits.   Data Reviewed:   I have personally reviewed following labs and imaging studies:  Labs: Labs reviewed 01/05/17 show the following:   Basic Metabolic Panel:  Recent Labs Lab 01/05/17 0459 01/07/17 0530 01/08/17 0617 01/09/17 0442 01/10/17 0504  NA 136 137 135 139 136  K 4.0 3.1* 4.7 4.4 4.1  CL 102 102 105 102 101  CO2 _0 GLUCOSE 88 109* 84 118* 108*  BUN 21* _1 22*  CREATININE 0.76 0.76 0.75 0.87 0.77  CALCIUM 7.4* 7.5* 7.4* 7.7* 7.7*  MG  --   --  1.6* 1.6* 1.9   GFR Estimated Creatinine Clearance: 87.5 mL/min (by C-G formula based on SCr of 0.77 mg/dL). Liver Function Tests:  Recent Labs Lab 01/05/17 0459 01/07/17 0530 01/09/17 0442 01/10/17 0504  AST 65* 54* 47* 37  ALT 80* 65* 58 48  ALKPHOS 152* 119 133* 132*  BILITOT 3.9* 3.2* 3.1* 3.2*  PROT 4.3* 4.3* 4.7* 4.9*  ALBUMIN 1.8* 1.7* 1.9* 1.9*   No results for input(s): AMMONIA in the last 168 hours. CBC:  Recent Labs Lab 01/05/17 0459 01/07/17 0530 01/08/17 0617 01/09/17 0442 01/10/17 0504  WBC 6.8 4.7 7.8 7.0 9.0  HGB 9.6* 8.9* 11.0* 10.2* 10.4*  HCT 30.7* 28.7* 36.1* 32.9* 33.3*  MCV 103.0* 104.0* 104.3* 103.8* 102.1*  PLT 103* 77* 73* 82* 82*   CBG:  Recent Labs Lab 01/09/17 1206 01/09/17 1700 01/09/17 2128 01/10/17 0624 01/10/17 1130  GLUCAP 137* 143* 147* 101* 119*    Microbiology No results found for this or any previous visit (from the past 240 hour(s)).  Procedures and diagnostic studies:  No results found.  Medications:   . aspirin  81 mg Oral Daily  . chlorhexidine  15 mL Mouth Rinse BID  . Chlorhexidine Gluconate Cloth  6 each Topical Daily  . feeding supplement (ENSURE ENLIVE)  237 mL Oral TID WC  . furosemide  20 mg Intravenous Daily  . magic mouthwash  5 mL Oral TID  . mouth rinse  15 mL Mouth Rinse q12n4p  . midodrine  10 mg Oral TID WC  . saccharomyces boulardii  250 mg Oral BID  . tamsulosin  0.4 mg Oral Daily  . vancomycin  125 mg Oral QID  . [START ON 01/21/2017] vancomycin  125 mg Oral Q12H   Followed by  . [START ON 01/28/2017] vancomycin  125 mg Oral Daily   Followed by  . [START ON 02/04/2017] vancomycin  125 mg Oral Q48H   Followed by  . [START ON 02/12/2017] vancomycin  125 mg Oral Q72H   Continuous Infusions:    LOS: 17 days   Heaven Wandell, MD, FACP, FHM. [facility] Pager [phone]  If 7PM-7AM, please contact night-coverage [email or web address] Password [email or web address] 01/10/2017, 2:28 PM

## 2017-01-11 DIAGNOSIS — N401 Enlarged prostate with lower urinary tract symptoms: Secondary | ICD-10-CM

## 2017-01-11 DIAGNOSIS — R338 Other retention of urine: Secondary | ICD-10-CM

## 2017-01-11 NOTE — Progress Notes (Signed)
After getting patient back to bed, patient said he had to "go to the bathroom" and he then pushed the flexi-seal out. Patient then had a very large mushy bowel movement.

## 2017-01-11 NOTE — Progress Notes (Addendum)
Physical Therapy Treatment Patient Details Name: Danny Oneill MRN: [id number] DOB: [birth year] Today's Date: 01/11/2017    History of Present Illness Pt is an 83 yo male with PMH of HLD, lumbar spondylosis, MI, CAD, PAF, cardiomyopathy, CKD, CHF who was admitted on 8/30 with L hip fracture. He developed hypoxia and hypotension after surgery (L IM nail) on 9/4 and required re-intubation 9/4-9/6. Pt with septic shock from UTI, then found to have c diff, also with acute acaclulous colecystitis s/p perc drain 9/11.   PMH positive for MI, cardiomyopathy, CHF, first degree AV block, PAF, CKD III, bladder outlet obstruction, lumbar spondylosis, and TKA.   PT Comments    Pt remains limited by lethargy and decreased participation with therapy; required max encouragement from PT and family member to participate. MaxA for supine AROM/PROM of all four extremities; able to assist with BUEs for repositioning in bed, but not able to assist with BLEs beyond wiggling toes. MaxA+2 to roll in bed, as pt with improved ability to assist with BUEs on handrail; totalA transfer to chair with maxisky lift. Will continue to follow acutely.   Follow Up Recommendations  SNF     Equipment Recommendations  Other (comment) (TBD)    Recommendations for Other Services       Precautions / Restrictions Precautions Precautions: Fall Precaution Comments: rectal tube,  c-diff Restrictions Weight Bearing Restrictions: Yes LLE Weight Bearing: Partial weight bearing LLE Partial Weight Bearing Percentage or Pounds: 50    Mobility  Bed Mobility Overal bed mobility: Needs Assistance Bed Mobility: Rolling Rolling: +2 for physical assistance;Max assist         General bed mobility comments: MaxA+2 to roll R/L for maxisky pad placement; pt able to assist minimally by using BUEs on handrails for rolling. TotalA+2 to reposition in bed; pt able to move BUEs for pillow placement, but not assisting with repositioning of  BLEs  Transfers Overall transfer level: Needs assistance               General transfer comment: Bed to chair via maxisky lift  Ambulation/Gait                 Stairs            Wheelchair Mobility    Modified Rankin (Stroke Patients Only)       Balance Overall balance assessment: Needs assistance Sitting-balance support: Bilateral upper extremity supported;Feet supported Sitting balance-Leahy Scale: Poor Sitting balance - Comments: Pt unwilling to sit up in bed or once seated in recliner                                    Cognition Arousal/Alertness: Lethargic Behavior During Therapy: Flat affect Overall Cognitive Status: Impaired/Different from baseline Area of Impairment: Attention;Following commands;Awareness;Problem solving                   Current Attention Level: Sustained   Following Commands: Follows one step commands inconsistently     Problem Solving: Slow processing;Requires verbal cues;Decreased initiation General Comments: Pt again with minimal interaction this session, requiring repeated verbal cues to keep eyes open, answering questions minimally. Max cues when attempting AROM/PROM with pt participating minimally      Exercises      General Comments        Pertinent Vitals/Pain Pain Assessment: Faces Faces Pain Scale: Hurts a little bit Pain Location: Left leg Pain Descriptors / Indicators:  Grimacing;Guarding Pain Intervention(s): Monitored during session;Repositioned    Home Living                      Prior Function            PT Goals (current goals can now be found in the care plan section) Acute Rehab PT Goals Patient Stated Goal: None stated PT Goal Formulation: With patient Time For Goal Achievement: 2017-02-09 Progress towards PT goals: Progressing toward goals    Frequency    Min 2X/week      PT Plan Current plan remains appropriate    Co-evaluation               AM-PAC PT "6 Clicks" Daily Activity  Outcome Measure  Difficulty turning over in bed (including adjusting bedclothes, sheets and blankets)?: Unable Difficulty moving from lying on back to sitting on the side of the bed? : Unable Difficulty sitting down on and standing up from a chair with arms (e.g., wheelchair, bedside commode, etc,.)?: Unable Help needed moving to and from a bed to chair (including a wheelchair)?: Total Help needed walking in hospital room?: Total Help needed climbing 3-5 steps with a railing? : Total 6 Click Score: 6    End of Session Equipment Utilized During Treatment: Gait belt Activity Tolerance: Patient limited by lethargy Patient left: in chair;with call bell/phone within reach;with family/visitor present Nurse Communication: Mobility status;Need for lift equipment PT Visit Diagnosis: Muscle weakness (generalized) (M62.81);Difficulty in walking, not elsewhere classified (R26.2);Other symptoms and signs involving the nervous system (R29.898);Pain Pain - Right/Left: Left Pain - part of body: Hip     Time: 3845-3646 PT Time Calculation (min) (ACUTE ONLY): 36 min  Charges:  $Therapeutic Exercise: 8-22 mins $Therapeutic Activity: 8-22 mins                    G Codes:      Ina Homes, PT, DPT Acute Rehab Services  Pager: [phone]  Malachy Chamber 01/11/2017, 1:37 PM

## 2017-01-11 NOTE — Progress Notes (Signed)
Progress Note    Danny Oneill  KKD:[id number] DOB: 12-03-1931  DOA: 12/03/2016 PCP: Burnard Bunting, MD    Brief Narrative:   Chief complaint: F/U sepsis, hip fracture  Medical records reviewed and are as summarized below:  Danny Oneill is an 83 y.o. male with a PMH of asbestosis, CAD, chronic CHF, first-degree AV block, PAF, HLD, bladder outlet obstruction and stage III CKD who was admitted for treatment of a hip fracture status post mechanical fall.  Significant Events: 8/30 admit after mechanical fall > hip fx  9/4 intubated - S/P LEFT intertrochantric intramedullary nailing - tx to ICU for peri-op hypotension 9/6 extubated 9/8 [place] - negative gallstones, slight sludge in the gallbladder and there is slight diffuse thickening of the gallbladder wall. Negative sonographic Murphy's sign. 9/10 GI, Surgery, and IR consulted  9/11 cholecystostomy tubeplaced 9/21 IR cappedpercutaneous cholecystostomy drain 9/25 Palliative Care met w/ family  9/28 Cards consulted at family request for edema  9/30 transfer to Ortho floor - MS improving    Assessment/Plan:   Principal Problem:   Closed left hip fracture ([place]) S/P LEFT intertrochanteric intramedullary nailing 12/08/16 - transferred to ortho floor 01/03/17 to allow better access to PT/OT needs. Will likely need rehab when medially stable. Seen PT working with him today and requires lots of assistance. SNF at discharge. Physically deconditioned.Clinical social work following and thus far no SNF will take patient with Flexi seal in place.  Active Problems: Oral candidiasis Completed 10 day course of Diflucan. Also on MMW.  Acute metabolic encephalopathy. Multifactorial:  septic shock, high dose steroids, hypernatremia, mild uremia, sleep deprivation. limitsedatives and narcotics as able.  Zyprexa discontinued a few days ago. Mental status significantly improved over the last 3-4 days.  Septic shock due to E coli UTI Shock  resolved.  Follow-up blood and urine cultures were negative. Has completed his course of antibiotics.  C diff colitis Completed 2 week course PO Vancomycin on 2/26. Ongoing diarrhea - continue flexiseal as the patient still has diarrhea. Added probiotics. I discussed with infectious disease ID on call Dr. Michel Bickers who recommended pulse oral vancomycin taper as follows, started 01/07/17.  ? 125 mg four times daily for 10 to 14 days, followed by ? 125 mg twice daily for 7 days, followed by ? 125 mg once daily for 7 days, followed by ? 125 mg every other day for 7 days, followed by ? 125 mg every 3 days for 2 to 8 weeks   Stool volume continues to gradually improve (400 mL >180 mL >150 mL) and so has consistency. Discussed with RN today who indicates that he still has liquid stools and will need to continue with the flexi seal. Advised nursing to document strict and all stool output daily to determine timing of discontinuing his rectal tube.  Relative Adrenal insufficiency / Hypotension Was given a course of stress dose steroids - remains hemodynamically stable on midodrine. Coreg, Lisinopril remains on hold.  Acute Acalculous Colecystitis  Abdominal US noted gallbladder sludge/diffuse thickening - +Murphy sign on exam - 12/15/16  S/P percutaneous cholecystostomy drain > capped 12/25/16 w/ IR directing care of this issue. Appetite is reportedly improving.  Elevated LFTs and ammonialevel LFTs and Tbil now beginning to significantly trend downward - recheck intermittently. Lipitor on hold. LFTs improving.  Acute respiratory failure with hypoxia / Chronic Asbestosis Resolved w/ pt now on RA.   Chronic Systolic and Diastolic CHF/CAD/H/O MI S/P DES 02/2016. TTE 05/14/16 EF45-50% w/ grade 1  diastolic dysfunction, and repeat this admit essentially unchanged - anasarca is primarily due to low albumin creating the scenario of intravascular volume depletion despite total body volume overload -  he remains stable w/o significant pulmonary edema - continue low dose diuretic and follow. Family continues to be anxious about anasarca. Reassured that it may take awhile to resolve. Continue ASA. Brilinta on hold. Still has significant volume overload and unable to increase diuretic dose due to soft blood pressures.  Obesity/Moderate protein calorie malnutrition Body mass index is 28.97 kg/m. Continue nutritional supplements.   Paroxysmal atrial fibrillation Remains in NSR. DC telemetry 10/4.  Acute renal failure  Resolved with creatinine now normal.  Dysphagia SLP following - family agree the pt would NOT want a PEG - Cortrak now out and pt on a modified diet which he appears to be tolerating well thus far. Speech therapy follow-up appreciated and recommend dysphagia 3 diet and nectar thickened liquids. As per family, appetite slowly improving.  Hypernatremia Corrected w/ free water via IV - cautiously dosing w/ low dose lasix as discussed above . Resolved. Discontinued D5W.  Hypoglycemia Resolved.  Peripheral edema Due to above noted issues - pt having persisting neuropathic type pain in R foot since UNNA boot placed - UNNA discontinued 01/03/17.  Avoid neurontin or lyrica due to high risk of AMS.  BPH/urinary retention Flomax 0.4 mg daily. Still had Foley catheter. As per family, this was placed during this hospitalization. Patient had issues with urinary retention in the past requiring prolonged Foley catheter but had been removed.  Discontinued Foley catheter on 10/5. However after one or 2 days of trial without the Foley catheter, again developed urinary retention and Foley catheter had to be placed back on 10/8. He will have to discharge with Foley catheter and will need outpatient Urology consultation and follow-up for evaluation and management.  Anemia Stable. Likely secondary to acute illness and chronic disease.  Thrombocytopenia - Likely due to acute illness and  antimicrobials. Platelets have trended down to 77.? Related to C. difficile. Does not seem to have received heparin during this admission. Platelets seem to be slightly better. Follow CBCs closely.  Hypokalemia/hypomagnesemia - Secondary to diarrhea. Replaced. Periodically follow.   Adult failure to thrive - Multifactorial secondary to advanced age, frail health, multiple significant severe comorbidities and prolonged hospitalization. Have been updating family with prognosis and likelihood of very slow recovery and high chance of recurrent decline. Palliative care team signed off 9/30 and family wished to continue rehabilitation option. - I have been discussing patient's ongoing care on a daily basis with one of the family members at bedside on a daily basis.   Family Communication/Anticipated D/C date and plan/Code Status   DVT prophylaxis: SCDs ordered. Code Status: DNR Family Communication: Discussed in detail with patient's Son-in-law at bedside. Disposition Plan: SNF when medically stable. Still tenuous.   Medical Consultants:    IR  PCCM  Orthopedics  GI  Palliative Care   Cardiology    Anti-Infectives:    Diflucan 12/27/16--->  Oral Vanc 9/18 (effective) --->  Subjective:   Events from this morning noted. Discussed with RN reported that patient had in and out Foley catheter at midnight last night and 400 mL urine drained, apparently had in and out catheter early morning of 10/7 but this had not been reported to me and I had discussed patient's care in detail with the weekend RN. This morning again had urinary retention of 450 mL. Foley catheter placed. Abdominal discomfort improved.  Ongoing liquid stools in rectal tube. No pain reported.  Objective:    Vitals:   01/10/17 1300 01/10/17 2100 01/11/17 0500 01/11/17 0602  BP: (!) 92/56 125/63 (!) 85/48   Pulse: 75 76 76   Resp: '16 16 18   ' Temp: 97.8 F (36.6 C) 98 F (36.7 C) 98.2 F (36.8 C)   TempSrc:  Oral Oral Oral   SpO2: 97% 98% 95%   Weight:    99.6 kg (219 lb 9.6 oz)  Height:        Intake/Output Summary (Last 24 hours) at 01/11/17 1526 Last data filed at 01/11/17 1000  Gross per 24 hour  Intake              280 ml  Output             2250 ml  Net            -1970 ml   Filed Weights   01/08/17 0427 01/09/17 2229 01/11/17 0602  Weight: 99.9 kg (220 lb 3.8 oz) 101.1 kg (222 lb 12.8 oz) 99.6 kg (219 lb 9.6 oz)    Exam:  General: Pleasant elderly male, moderately built, frail, chronically ill-looking, lying comfortably supine in bed. Seems to be taking a nap after this morning's activity. Cardiovascular: S1 and S2 heard, RRR. No JVD or murmurs. 1+ bilateral thigh/leg edema, may be slightly better.  Lungs: clear to auscultation. No increased work of breathing. Stable Abdomen: Nondistended, soft and nontender. Normal bowel sounds heard. Foley catheter draining straw-colored urine. Flaxi seal with small amount of liquid green stools. Skin: Warm and dry. No rashes or lesions. Extremities: No clubbing or cyanosis. Pedal pulses 2+. Left leg with Ardelia Mems boot CNS: Somnolent but easily arousable and oriented 2. No focal deficits.   Data Reviewed:   I have personally reviewed following labs and imaging studies:  Labs: Labs reviewed 01/05/17 show the following:   Basic Metabolic Panel:  Recent Labs Lab 01/05/17 0459 01/07/17 0530 01/08/17 0617 01/09/17 0442 01/10/17 0504  NA 136 137 135 139 136  K 4.0 3.1* 4.7 4.4 4.1  CL 102 102 105 102 101  CO2 '25 27 23 29 27  ' GLUCOSE 88 109* 84 118* 108*  BUN 21* '20 19 20 ' 22*  CREATININE 0.76 0.76 0.75 0.87 0.77  CALCIUM 7.4* 7.5* 7.4* 7.7* 7.7*  MG  --   --  1.6* 1.6* 1.9   GFR Estimated Creatinine Clearance: 86.9 mL/min (by C-G formula based on SCr of 0.77 mg/dL). Liver Function Tests:  Recent Labs Lab 01/05/17 0459 01/07/17 0530 01/09/17 0442 01/10/17 0504  AST 65* 54* 47* 37  ALT 80* 65* 58 48  ALKPHOS 152* 119 133* 132*   BILITOT 3.9* 3.2* 3.1* 3.2*  PROT 4.3* 4.3* 4.7* 4.9*  ALBUMIN 1.8* 1.7* 1.9* 1.9*   No results for input(s): AMMONIA in the last 168 hours. CBC:  Recent Labs Lab 01/05/17 0459 01/07/17 0530 01/08/17 0617 01/09/17 0442 01/10/17 0504  WBC 6.8 4.7 7.8 7.0 9.0  HGB 9.6* 8.9* 11.0* 10.2* 10.4*  HCT 30.7* 28.7* 36.1* 32.9* 33.3*  MCV 103.0* 104.0* 104.3* 103.8* 102.1*  PLT 103* 77* 73* 82* 82*   CBG:  Recent Labs Lab 01/09/17 1206 01/09/17 1700 01/09/17 2128 01/10/17 0624 01/10/17 1130  GLUCAP 137* 143* 147* 101* 119*   Microbiology No results found for this or any previous visit (from the past 240 hour(s)).  Procedures and diagnostic studies:  No results found.  Medications:   .  aspirin  81 mg Oral Daily  . chlorhexidine  15 mL Mouth Rinse BID  . Chlorhexidine Gluconate Cloth  6 each Topical Daily  . feeding supplement (ENSURE ENLIVE)  237 mL Oral TID WC  . furosemide  20 mg Intravenous Daily  . magic mouthwash  5 mL Oral TID  . mouth rinse  15 mL Mouth Rinse q12n4p  . midodrine  10 mg Oral TID WC  . saccharomyces boulardii  250 mg Oral BID  . tamsulosin  0.4 mg Oral Daily  . vancomycin  125 mg Oral QID  . [START ON 01/21/2017] vancomycin  125 mg Oral Q12H   Followed by  . [START ON 01/28/2017] vancomycin  125 mg Oral Daily   Followed by  . [START ON 02/04/2017] vancomycin  125 mg Oral Q48H   Followed by  . [START ON 02/12/2017] vancomycin  125 mg Oral Q72H   Continuous Infusions:    LOS: 85 days   Febe Champa, MD, FACP, FHM. [facility] Pager [phone]  If 7PM-7AM, please contact night-coverage [email or web address] Password [email or web address] 01/11/2017, 3:26 PM

## 2017-01-12 LAB — COMPREHENSIVE METABOLIC PANEL WITH GFR
ALT: 37 U/L (ref 17–63)
AST: 33 U/L (ref 15–41)
Albumin: 1.7 g/dL — ABNORMAL LOW (ref 3.5–5.0)
Alkaline Phosphatase: 109 U/L (ref 38–126)
Anion gap: 7 (ref 5–15)
BUN: 24 mg/dL — ABNORMAL HIGH (ref 6–20)
CO2: 31 mmol/L (ref 22–32)
Calcium: 7.6 mg/dL — ABNORMAL LOW (ref 8.9–10.3)
Chloride: 100 mmol/L — ABNORMAL LOW (ref 101–111)
Creatinine, Ser: 0.82 mg/dL (ref 0.61–1.24)
GFR calc Af Amer: >60 mL/min
GFR calc non Af Amer: >60 mL/min
Glucose, Bld: 88 mg/dL (ref 65–99)
Potassium: 3.3 mmol/L — ABNORMAL LOW (ref 3.5–5.1)
Sodium: 138 mmol/L (ref 135–145)
Total Bilirubin: 2.7 mg/dL — ABNORMAL HIGH (ref 0.3–1.2)
Total Protein: 4.3 g/dL — ABNORMAL LOW (ref 6.5–8.1)

## 2017-01-12 LAB — GLUCOSE, CAPILLARY
GLUCOSE-CAPILLARY: 160 mg/dL — AB (ref 65–99)
GLUCOSE-CAPILLARY: 176 mg/dL — AB (ref 65–99)

## 2017-01-12 LAB — CBC
HCT: 32 % — ABNORMAL LOW (ref 39.0–52.0)
HEMOGLOBIN: 9.9 g/dL — AB (ref 13.0–17.0)
MCH: 31.7 pg (ref 26.0–34.0)
MCHC: 30.9 g/dL (ref 30.0–36.0)
MCV: 102.6 fL — ABNORMAL HIGH (ref 78.0–100.0)
PLATELETS: 101 10*3/uL — AB (ref 150–400)
RBC: 3.12 MIL/uL — AB (ref 4.22–5.81)
RDW: 21.7 % — ABNORMAL HIGH (ref 11.5–15.5)
WBC: 7.1 10*3/uL (ref 4.0–10.5)

## 2017-01-12 LAB — MAGNESIUM: Magnesium: 1.6 mg/dL — ABNORMAL LOW (ref 1.7–2.4)

## 2017-01-12 MED ORDER — VANCOMYCIN 50 MG/ML ORAL SOLUTION: 125.0000 mg | Freq: Every day | ORAL | 0 refills | Status: AC

## 2017-01-12 MED ORDER — VANCOMYCIN 50 MG/ML ORAL SOLUTION: 125.0000 mg | Freq: Two times a day (BID) | ORAL | Status: AC

## 2017-01-12 MED ORDER — MAGIC MOUTHWASH: 5.0000 mL | Freq: Three times a day (TID) | ORAL | 0 refills | Status: AC

## 2017-01-12 MED ORDER — ENSURE ENLIVE PO LIQD: 237.0000 mL | Freq: Three times a day (TID) | ORAL | 12 refills | Status: AC

## 2017-01-12 MED ORDER — VANCOMYCIN 50 MG/ML ORAL SOLUTION: 125.0000 mg | Freq: Four times a day (QID) | ORAL | 0 refills | Status: AC

## 2017-01-12 MED ORDER — VANCOMYCIN 50 MG/ML ORAL SOLUTION: 125.0000 mg | ORAL | 0 refills | Status: AC

## 2017-01-12 MED ORDER — MIDODRINE HCL 10 MG PO TABS: 10.0000 mg | ORAL_TABLET | Freq: Three times a day (TID) | ORAL | Status: AC

## 2017-01-12 MED ORDER — RESOURCE THICKENUP CLEAR PO POWD: 125.0000 g | ORAL | Status: AC | PRN

## 2017-01-12 MED ORDER — POTASSIUM CHLORIDE CRYS ER 20 MEQ PO TBCR
40.0000 meq | EXTENDED_RELEASE_TABLET | Freq: Every day | ORAL | Status: DC
  Administered 2017-01-12 – 2017-01-15 (×4): 40 meq via ORAL
  Filled 2017-01-12 (×4): qty 2

## 2017-01-12 MED ORDER — POTASSIUM CHLORIDE CRYS ER 20 MEQ PO TBCR: 40.0000 meq | EXTENDED_RELEASE_TABLET | Freq: Every day | ORAL | Status: AC

## 2017-01-12 NOTE — Telephone Encounter (Signed)
Cindee Lame - please advise if this message can be closed. Thanks!

## 2017-01-12 NOTE — Social Work (Signed)
CSW received a call back from daughter indicating that she discussed with her brothers and they do not want patient to go SNF-[name] and indicated that [name] will have a bed on Thursday and she wants pt to stay in hospital until Thursday. CSW advised family member that patient is already discharged and cannot stay until Thursday. CSW reiterated that they would be responsible for cost and doctor will not authorize a patient to stay until Thursday as patient is medically ready to discharge.   Daughter indicated that patient will not go to either [name] or [name] and what are her options. CSW indicated that patient is medically ready and is she does not select a SNF then her other options would be home with services. Family indicated that patient is not going anywhere today. Daughter said that she wanted to talk with doctor. CSW encouraged her to follow up with her nurse on floor and she can communicate with the doctor. Daughter indicated that CSW should call brother to discuss however CSW spoke to brother today and he was not pleased with discharge plan. CSW will not f/u with brother at this time.  CSW discussed with clinical team and RNCM for assistance with this disposition.  [name], LCSW Clinical Social Worker [phone]

## 2017-01-12 NOTE — Care Management (Signed)
Case manager has been following patient along with Child psychotherapist. Patient's family is adamant that they are not allowing  patient to discharge to SNF until they get bed that they want. Patient has a bed offer from [facility], family refuses. Case manager has spoken with Okey Regal, patient's daughter and discussed Medicare appeal. Okey Regal says that "we are responsible for patient's condition and c-diff and there is no reason why he can not remain here until Thursday when a bed will become available [facility]". Case manager has notified Dr. Mahala Menghini and medical advisior, Dr.Aronson. CM will wait for [facility] decision.

## 2017-01-12 NOTE — Social Work (Addendum)
CSW will continue to follow for disposition.   Pt will go to SNF when medically ready.  [name], LCSW Clinical Social Worker [phone]

## 2017-01-12 NOTE — Social Work (Addendum)
CSW discussed with family the bed offers.  [name] has offered a bed. Family then indicated that they wanted to CSW to see if [name] and [name] can offer bed. CSW will f/u.  11:15am: [name] from [name] indicated that she will not have a bed until Friday.   12:06pm: CSW spoke with daughter and advised the SNF offers of only [name] and she indicated that she does not want the SNF because she has been there before and is not leaving until they get a place that they want. CSW is still f/u with [name] as CSW has left two messages.   12:27pm: CSW spoke with [name] at [name] who advised that they do not have a bed this week.  CSW called daughter back and advised of same and she will get back to CSW. CSW will f/u with [name] SNF to see if they can offer a bed as well. Daughter does not want to go to [name] because they did not have the best experience with SNF in the past.  At present, [name], [name], [name], [name] cannot offer a SNF bed.  12:47pm-[name] cannot offer a bed. [name] can offer a bed.  CSW called daughter back advised that [name] and [name] can offer a bed. Daughter indicated that she will discuss with brother and let me know. CSW advised that CSW will need to obtain insurance auth for SNF placement and needs her bed choice as soon as possible. Daughter indicated she would f/u.  [name], LCSW Clinical Social Worker [phone]

## 2017-01-12 NOTE — Progress Notes (Signed)
Palliative care progress note  I know Mr. Embree from earlier in admission.  Our team received calls from several family members who reported that he was being "thrown out" and that they feel he is "not ready to go."  I met with family in conjunction with CM and reviewed that if family disagrees he is ready for discharge, they may appeal discharge through medicare.  Case management present to facilitate process.  Please let me know if we can be of further assistance, but palliative will not round on patient moving forward as goals are clear and needs are for continued discharge planning.  Micheline Rough, MD [facility] Palliative Medicine Team [phone]  NO CHARGE NOTE

## 2017-01-12 NOTE — Social Work (Signed)
CSW called admissions at [facility] to see if they can offer a bed as patient no longer has flexiseal. CSW left msg and will f/u.  CSW called admissions at [facility] to see if they can offer a bed again. They reviewed and they cannot offer a bed.  CSW called admission at [facility] and they do not have a bed until maybe Friday.  [name], LCSW Clinical Social Worker [phone]

## 2017-01-12 NOTE — Discharge Summary (Signed)
Physician Discharge Summary  Danny Oneill ZOX:[id number] DOB: 30-Jul-1931 DOA: 12/03/2016  PCP: Geoffry Paradise, MD  Admit date: 12/03/2016 Discharge date: 01/12/2017  Time spent: 1 hour   Recommendations for Outpatient Follow-up:  1. Patient will need vancomycin taper as per instructions--would get CBC plus differential in 1 week and complete metabolic panel at that time as well 2. Patient has been discontinued off of lisinopril this admission--Brillinta was restarted on d/c given recent MI last yr 3. Will need further follow-up with outpatient physicians--will also need discussions regarding goals of care if patient fails to thrive ongoing--- extensive discussions have been undertaken during hospital stay and family elected for aggressive measures and care 4. Will need outpatient follow-up coordinated by facility with Dr. Sherron Monday of urology 5. As an outpatient please consider placing on NOAC and getting the patient a urology follow-up if patient does thrive  Discharge Diagnoses:  Principal Problem:   Closed left hip fracture (HCC) Active Problems:   Hyperlipidemia   CAD (coronary artery disease)   CKD (chronic kidney disease), stage III (HCC)   Hip fracture (HCC)   Compromised airway   Hypovolemic shock (HCC)   Elevated troponin I level   Chronic combined systolic and diastolic heart failure (HCC)   Acute respiratory failure (HCC)   Elevated LFTs   Pressure injury of skin   Discharge Condition: stable but guarded  Diet recommendation: feeds  Filed Weights   01/08/17 0427 01/09/17 2229 01/11/17 0602  Weight: 99.9 kg (220 lb 3.8 oz) 101.1 kg (222 lb 12.8 oz) 99.6 kg (219 lb 9.6 oz)    History of present illness:  83 year old male admitted on 12/03/2016 with a hip fracture to the hospitalist service He declined postop and was intubated on 9/4--found to have cholecystitis and cholecystotomy placed 9/11-e had an IR capped percutaneous drain Because of his overwhelming issues,  palliative care consulted on 9/25 and it was elected to continue aggressive measures Cardiology consulted regarding edema---mental status very slowly improved over the course of 9/30 to day of discharge  Had complications of oral candidiasis as well as C. Difficile colitis and Escherichia coli pyelonephritis this admission  Hospital Course:   Hip fracture  Repaired on 9/4 and will need skilled therapy on discharge  Metabolic encephalopathy multifactorial  Stress dose steroids, septic shock-from UTI, acalculous cholecystitis and also C. Difficile colitis  All of these were treated and he was weaned off of stress dose steroids  His current duration of vancomycin is stepwise weaning for complicated C. difficile   125 mg iv x/day till 10/11    then 125 q 12 till 10/19   Then 125 qd till 10/26   Then 125 qod till 11/3   Then 125 q 72 hr till 11/11  history of paroxysmal atrial fibrillation CAD   Felt to be related to sepsis physiology-for now aspirin despite high CHAD2VASC2   Resumed on Brillinta--he is not a candidate for ongoing rate control as blood pressures are low and this is only paroxysmal   Lisinopril held given low BP  Dysphagia  Speech therapy saw the patient and initially patient was on core track  Eventually patient was placed on dysphagia 3 nectar thickened diet  Hypernatremia, Hypervolemic Peripheral edema  Placed on Lasix on discharge will need cautious use of the same as an outpatient  Avoid use of Neurontin or Lyrica secondary to risk of altered mental status  Benign prostatic hypertrophy/urinary retention  We will need to continue Foley catheter on discharge from facility and  will transition  Multifactorial adult failure to thrive  Long discussions have been held with palliative care in addition to family members   I personally sat down despite today be my first day with this family member and spent 30 minutes discussing goals of care with his daughter, and  fielded questions from family members--- I discussed natural progression of his disease process and adult failure to thrive in detail---I have mentioned that he might thrive but he might not---I'm cautiously optimistic that he will make good initial recovery that this is dependent on his willingness to work with therapy at the outpatient facility  Patient will need outpatient discussions ongoing and if fails to thrive would discuss with family and hospice and palliative care at the facility        Discharge Exam: Vitals:   01/11/17 2211 01/12/17 0624  BP: (!) 80/46 (!) 93/51  Pulse: 71 72  Resp: 14 16  Temp: 97.6 F (36.4 C) 97.8 F (36.6 C)  SpO2: 99% 96%    General: external ocular movements intact, tells me correctly that he had a full breakfast of eggs and bacon He is improved from 10/8--- was more confused then  His neck is soft and supple Cardiovascular: S1-S2 no murmur rub or gallop Respiratory: clinically clear no added sound no rales no rhonchi He has a Foley catheter in   Discharge Instructions   Discharge Instructions    Diet - low sodium heart healthy    Complete by:  As directed    Do Not Remove Foley    Complete by:  As directed    Increase activity slowly    Complete by:  As directed      Current Discharge Medication List    START taking these medications   Details  feeding supplement, ENSURE ENLIVE, (ENSURE ENLIVE) LIQD Take 237 mLs by mouth 3 (three) times daily with meals. Qty: 237 mL, Refills: 12    magic mouthwash SOLN Take 5 mLs by mouth 3 (three) times daily. Refills: 0    Maltodextrin-Xanthan Gum (RESOURCE THICKENUP CLEAR) POWD Take 125 g by mouth as needed.    midodrine (PROAMATINE) 10 MG tablet Take 1 tablet (10 mg total) by mouth 3 (three) times daily with meals.    potassium chloride SA (K-DUR,KLOR-CON) 20 MEQ tablet Take 2 tablets (40 mEq total) by mouth daily.    !! vancomycin (VANCOCIN) 50 mg/mL oral solution Take 2.5 mLs (125 mg  total) by mouth 4 (four) times daily. Qty: 20 mL, Refills: 0    !! vancomycin (VANCOCIN) 50 mg/mL oral solution Take 2.5 mLs (125 mg total) by mouth every 12 (twelve) hours.    !! vancomycin (VANCOCIN) 50 mg/mL oral solution Take 2.5 mLs (125 mg total) by mouth daily. Qty: 17.5 mL, Refills: 0    !! vancomycin (VANCOCIN) 50 mg/mL oral solution Take 2.5 mLs (125 mg total) by mouth every other day. Qty: 7.5 mL, Refills: 0    !! vancomycin (VANCOCIN) 50 mg/mL oral solution Take 2.5 mLs (125 mg total) by mouth every 3 (three) days. Qty: 5 mL, Refills: 0     !! - Potential duplicate medications found. Please discuss with provider.    CONTINUE these medications which have NOT CHANGED   Details  aspirin EC 81 MG tablet Take 81 mg by mouth daily with breakfast.     atorvastatin (LIPITOR) 20 MG tablet Take 1 tablet (20 mg total) by mouth daily. Qty: 30 tablet, Refills: 11    carvedilol (COREG)  3.125 MG tablet Take 3.125 mg by mouth 2 (two) times daily. Refills: 3    furosemide (LASIX) 20 MG tablet Take 1 tablet by mouth daily and ok to use extra tablet as needed for swelling. Qty: 180 tablet, Refills: 3   Associated Diagnoses: PAF (paroxysmal atrial fibrillation) (HCC); Coronary artery disease involving native coronary artery of native heart without angina pectoris    tamsulosin (FLOMAX) 0.4 MG CAPS capsule TAKE 1 CAPSULE(0.4 MG) BY MOUTH DAILY AFTER SUPPER Qty: 90 capsule, Refills: 3    ticagrelor (BRILINTA) 90 MG TABS tablet Take 1 tablet (90 mg total) by mouth 2 (two) times daily. Qty: 60 tablet, Refills: 11      STOP taking these medications     lisinopril (PRINIVIL,ZESTRIL) 2.5 MG tablet        Allergies  Allergen Reactions  . Amiodarone Rash  . Morphine And Related Other (See Comments)    Confusion/ hallucinations    Follow-up Information    Yolonda Kida, MD. Schedule an appointment as soon as possible for a visit in 2 week(s).   Specialty:  Orthopedic  Surgery Why:  For wound re-check, For suture removal Contact information: [address] [place] [address] [phone]        Gilmer Mor, DO Follow up in 3 week(s).   Specialty:  Interventional Radiology Why:  our office will call you with appointment date and time Contact information: [address] [place] [address] [phone]            The results of significant diagnostics from this hospitalization (including imaging, microbiology, ancillary and laboratory) are listed below for reference.    Significant Diagnostic Studies: Ct Head Wo Contrast  Result Date: 12/29/2016 CLINICAL DATA:  LEFT level consciousness. Recent hip surgery in urinary tract infection. EXAM: CT HEAD WITHOUT CONTRAST TECHNIQUE: Contiguous axial images were obtained from the base of the skull through the vertex without intravenous contrast. COMPARISON:  CT HEAD October 14, 2016 FINDINGS: BRAIN: No intraparenchymal hemorrhage, mass effect nor midline shift. The ventricles and sulci are normal for age. Patchy supratentorial white matter hypodensities less than expected for patient's age, though non-specific are most compatible with chronic small vessel ischemic disease. No acute large vascular territory infarcts. No abnormal extra-axial fluid collections. Basal cisterns are patent. VASCULAR: Severe calcific atherosclerosis of the carotid siphons. SKULL: No skull fracture. No significant scalp soft tissue swelling. SINUSES/ORBITS: Lobulated mucosal thickening paranasal sinuses with chronic chronic bony remodeling maxillary and sphenoid sinuses compatible with chronic sinusitis. Reviewed The included ocular globes and orbital contents are non-suspicious. Status post bilateral ocular lens implants. Nasogastric tube via LEFT nares. OTHER: Patient is edentulous. IMPRESSION: 1. No acute intracranial process. 2. Severe atherosclerosis carotid siphons, otherwise negative noncontrast CT HEAD  for age. Electronically Signed   By: Awilda Metro M.D.   On: 12/29/2016 19:26   Ir Perc Cholecystostomy  Result Date: 12/15/2016 INDICATION: 83 year old male with a history of sepsis, jaundice, concern for acute cholecystitis/acalculous cholecystitis EXAM: CHOLECYSTOSTOMY MEDICATIONS: None ANESTHESIA/SEDATION: Moderate (conscious) sedation was employed during this procedure. A total of Versed 1.0 mg and Fentanyl 50 mcg was administered intravenously. Moderate Sedation Time: 10 minutes. The patient's level of consciousness and vital signs were monitored continuously by radiology nursing throughout the procedure under my direct supervision. FLUOROSCOPY TIME:  Fluoroscopy Time: 0 minutes 42 seconds (4 mGy). COMPLICATIONS: None PROCEDURE: Informed written consent was obtained from the patient and the patient's family after a thorough discussion of the  procedural risks, benefits and alternatives. All questions were addressed. Maximal Sterile Barrier Technique was utilized including caps, mask, sterile gowns, sterile gloves, sterile drape, hand hygiene and skin antiseptic. A timeout was performed prior to the initiation of the procedure. Ultrasound survey of the right upper quadrant was performed for planning purposes. Once the patient is prepped and draped in the usual sterile fashion, the skin and subcutaneous tissues overlying the gallbladder were generously infiltrated 1% lidocaine for local anesthesia. A coaxial needle was advanced under ultrasound guidance through the skin subcutaneous tissues and a small segment of liver into the gallbladder lumen. With removal of the stylet, spontaneous dark bile drainage occurred. Using modified Seldinger technique, a 10 French drain was placed into the gallbladder fossa, with aspiration of the sample for the lab. Contrast injection confirmed position of the tube within the gallbladder lumen. Drainage catheter was attached to gravity drain with a suture retention placed.  Patient tolerated the procedure well and remained hemodynamically stable throughout. No complications were encountered and no significant blood loss encountered. IMPRESSION: Status post image guided percutaneous cholecystostomy. Signed, Yvone Neu. Loreta Ave, DO [facility] Radiology Electronically Signed   By: Gilmer Mor D.O.   On: 12/15/2016 17:03   Dg Chest Port 1 View  Result Date: 12/30/2016 CLINICAL DATA:  Pneumonia with weakness EXAM: PORTABLE CHEST 1 VIEW COMPARISON:  12/21/2016 FINDINGS: There is extensive calcified pleural plaques bilaterally on April 2018 chest CT there is also subpleural lines and round atelectasis. Lung volumes are low. Stable borderline heart size with aortic tortuosity. A feeding tube at least reaches the stomach. No Kerley lines, air bronchograms, or pneumothorax. 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 No acute finding when compared to prior. Superimposed pneumonia could easily be obscured. Electronically Signed   By: Marnee Spring M.D.   On: 12/30/2016 07:39   Dg Chest Port 1 View  Result Date: 12/21/2016 CLINICAL DATA:  Acute respiratory failure EXAM: PORTABLE CHEST 1 VIEW COMPARISON:  12/15/2016 FINDINGS: Calcified pleural plaques bilaterally. Right central line and feeding tube are unchanged. Bilateral parenchymal scarring changes are stable. Heart is upper limits normal in size. No definite visible significant effusions. IMPRESSION: Stable pleuroparenchymal changes most compatible with asbestosis. No change. Electronically Signed   By: Charlett Nose M.D.   On: 12/21/2016 07:17   Dg Chest Port 1 View  Result Date: 12/15/2016 CLINICAL DATA:  Prior history of respiratory failure. Asbestosis. Coronary artery disease. EXAM: PORTABLE CHEST 1 VIEW COMPARISON:  12/10/2016. FINDINGS: Interim extubation. Interim removal of NG tube and placement of feeding tube. Right IJ line stable  position. Heart size normal. Chronic bilateral from interstitial prominence with bilateral calcified pleural plaques are noted consistent with history of asbestosis. No new pulmonary findings. IMPRESSION: For 1 interim extubation. Interim removal of NG tube replacement feeding tube. Right IJ line stable position. 2. Bilateral pulmonary and pleural changes of asbestosis again noted without interim change. No acute pulmonary abnormality identified. Electronically Signed   By: Maisie Fus  Register   On: 12/15/2016 07:11   Dg Abd Portable 2v  Result Date: 12/17/2016 CLINICAL DATA:  83 year old male with abdominal pain. Evaluate for pneumoperitoneum. EXAM: PORTABLE ABDOMEN - 2 VIEW COMPARISON:  None. FINDINGS: Feeding tube tip is in the second portion of the duodenum. Percutaneous cholecystostomy drain noted projecting over the right upper quadrant of the abdomen. Gas and stool are noted in the colon and distal rectum. No pathologic dilatation of small bowel. Decubitus  view demonstrates no pneumoperitoneum. Calcified pleural plaques are incidentally noted. IMPRESSION: 1. No pneumoperitoneum. 2. Nonobstructive bowel gas pattern. 3. Support apparatus, as above. Electronically Signed   By: Trudie Reed M.D.   On: 12/17/2016 11:13   Dg Hip Port Unilat With Pelvis 1v Left  Result Date: 12/29/2016 CLINICAL DATA:  Left hip fracture EXAM: DG HIP (WITH OR WITHOUT PELVIS) 1V PORT LEFT COMPARISON:  Fluoroscopy 12/08/2016 FINDINGS: Status post ORIF of an intertrochanteric left femur fracture. A lesser trochanteric fracture fragment appears more distracted. Opaque structure over the pelvis is likely a rectal tube. Distorted pelvic ring due to rotation. No evidence of fracture. IMPRESSION: Recent left intertrochanteric femur fracture and ORIF. Greater distraction of the lesser trochanteric fragment compared to fluoroscopy 12/08/2016. The hardware is in stable position. Electronically Signed   By: Marnee Spring M.D.   On:  12/29/2016 07:45   US Abdomen Limited Ruq  Result Date: 12/22/2016 CLINICAL DATA:  83 year old male with elevated transaminases. Percutaneous cholecystotomy. Subsequent encounter. EXAM: ULTRASOUND ABDOMEN LIMITED RIGHT UPPER QUADRANT COMPARISON:  12/17/2016 plain film examination. 12/15/2016 cholecystogram. 12/12/2016 ultrasound. FINDINGS: Gallbladder: Cholecystostomy tube in place. Gallbladder sludge. Gallbladder wall thickening measuring up to 5.1 mm. Ultrasonographer was not able to determine if the patient was tender over this region secondary to bandage. Common bile duct: Diameter: 5.4 mm. Liver: No hepatic lesion or intrahepatic biliary duct dilation. Portal vein is patent on color Doppler imaging with normal direction of blood flow towards the liver. Ascites.  Right-sided pleural effusion. IMPRESSION: Cholecystostomy tube in place. Gallbladder sludge. Gallbladder wall thickening measuring up to 5.1 mm. Ultrasonographer was not able to determine if the patient was tender over this region secondary to bandage. No hepatic lesion or intrahepatic biliary duct dilation. Ascites. Right-sided pleural effusion. Electronically Signed   By: Lacy Duverney M.D.   On: 12/22/2016 12:34    Microbiology: No results found for this or any previous visit (from the past 240 hour(s)).   Labs: Basic Metabolic Panel:  Recent Labs Lab 01/07/17 0530 01/08/17 0617 01/09/17 0442 01/10/17 0504 01/12/17 0528  NA 137 135 139 136 138  K 3.1* 4.7 4.4 4.1 3.3*  CL 102 105 102 101 100*  CO2 GLUCOSE 109* 84 118* 108* 88  BUN 22* 24*  CREATININE 0.76 0.75 0.87 0.77 0.82  CALCIUM 7.5* 7.4* 7.7* 7.7* 7.6*  MG  --  1.6* 1.6* 1.9 1.6*   Liver Function Tests:  Recent Labs Lab 01/07/17 0530 01/09/17 0442 01/10/17 0504 01/12/17 0528  AST 54* 47* 37 33  ALT 65* 58 48 37  ALKPHOS 119 133* 132* 109  BILITOT 3.2* 3.1* 3.2* 2.7*  PROT 4.3* 4.7* 4.9* 4.3*  ALBUMIN 1.7* 1.9* 1.9* 1.7*   No  results for input(s): LIPASE, AMYLASE in the last 168 hours. No results for input(s): AMMONIA in the last 168 hours. CBC:  Recent Labs Lab 01/07/17 0530 01/08/17 0617 01/09/17 0442 01/10/17 0504 01/12/17 0528  WBC 4.7 7.8 7.0 9.0 7.1  HGB 8.9* 11.0* 10.2* 10.4* 9.9*  HCT 28.7* 36.1* 32.9* 33.3* 32.0*  MCV 104.0* 104.3* 103.8* 102.1* 102.6*  PLT 77* 73* 82* 82* 101*   Cardiac Enzymes: No results for input(s): CKTOTAL, CKMB, CKMBINDEX, TROPONINI in the last 168 hours. BNP: BNP (last 3 results)  Recent Labs  02/24/16 1610 10/14/16 1600 01/01/17 1940  BNP 745.7* 474.8* 332.1*    ProBNP (last 3 results) No results for input(s): PROBNP in the last 8760  hours.  CBG:  Recent Labs Lab 01/09/17 1206 01/09/17 1700 01/09/17 2128 01/10/17 0624 01/10/17 1130  GLUCAP 137* 143* 147* 101* 119*       Signed:  Rhetta Mura MD   [facility] 01/12/2017, 9:49 AM

## 2017-01-12 NOTE — Social Work (Signed)
CSW discussed with family disposition to SNF today. CSW spoke with brother Mardelle Matte on the phone who was not pleased with the plan for discharge and indicated that he would obtain legal counsel if we try to discharge patient.  CSW discussed with clinical team to make them aware, Dr. Mahala Menghini says that patient is medically ready for discharge.    [name], LCSW Clinical Social Worker [phone]

## 2017-01-12 NOTE — Social Work (Addendum)
CSW called daughter twice to follow up on bed offers again. CSW unable to reach and unable to leave a voicemail message. CSW will continue to follow-up.  CSW spoke with Tammy at [name] and she confirmed that patient insurance [phone] is good until tomorrow. If patient not in a SNF by today, then CSW will need to obtain Insurance Auth again. CSW will keep Tammy abreast of disposition.  CSW will continue to follow-up.  [name], LCSW Clinical Social Worker [phone]

## 2017-01-13 LAB — GLUCOSE, CAPILLARY
GLUCOSE-CAPILLARY: 164 mg/dL — AB (ref 65–99)
GLUCOSE-CAPILLARY: 122 mg/dL — AB (ref 65–99)
Glucose-Capillary: 182 mg/dL — ABNORMAL HIGH (ref 65–99)

## 2017-01-13 MED ORDER — TICAGRELOR 90 MG PO TABS
90.0000 mg | ORAL_TABLET | Freq: Two times a day (BID) | ORAL | Status: DC
  Administered 2017-01-13 – 2017-01-15 (×4): 90 mg via ORAL
  Filled 2017-01-13 (×5): qty 1

## 2017-01-13 NOTE — Progress Notes (Signed)
Progress Note    Savio Albrecht  HNG:[id number] DOB: 01-07-1932  DOA: 12/03/2016 PCP: Burnard Bunting, MD    Brief Narrative:   Chief complaint: F/U sepsis, hip fracture  Medical records reviewed and are as summarized below:  Laban Orourke is an 83 y.o. male with a PMH of asbestosis, CAD, chronic CHF, first-degree AV block, PAF, HLD, bladder outlet obstruction and stage III CKD who was admitted for treatment of a hip fracture status post mechanical fall.  Significant Events: 8/30 admit after mechanical fall > hip fx  9/4 intubated - S/P LEFT intertrochantric intramedullary nailing - tx to ICU for peri-op hypotension 9/6 extubated 9/8 [place] - negative gallstones, slight sludge in the gallbladder and there is slight diffuse thickening of the gallbladder wall. Negative sonographic Murphy's sign. 9/10 GI, Surgery, and IR consulted  9/11 cholecystostomy tubeplaced 9/21 IR cappedpercutaneous cholecystostomy drain 9/25 Palliative Care met w/ family  9/28 Cards consulted at family request for edema  9/30 transfer to Ortho floor - MS improving    Assessment/Plan:   Principal Problem:   Closed left hip fracture ([place]) S/P LEFT intertrochanteric intramedullary nailing 12/08/16 - transferred to ortho floor 01/03/17 to allow better access to PT/OT needs.   Seen PT working with him today and requires lots of assistance. SNF at discharge.  Physically deconditioned. Clinical social work following  Oral candidiasis Completed 10 day course of Diflucan. Also on MMW.  Acute metabolic encephalopathy. Probable hospital acquired/ICU related delirium Multifactorial:  septic shock, high dose steroids, hypernatremia, mild uremia, sleep deprivation. limitsedatives and narcotics as able.   Zyprexa discontinued earlier in admission Mental status significantly improved over the last 3-4 days.  Septic shock due to E coli UTI Shock resolved.  Follow-up blood and urine cultures were negative. Has  completed his course of antibiotics.  C diff colitis Completed 2 week course PO Vancomycin on 2/26. Ongoing diarrhea - continue flexiseal as the patient still has diarrhea. Added probiotics. I discussed with infectious disease ID on call Dr. Michel Bickers who recommended pulse oral vancomycin taper as follows, started 01/07/17.  ? 125 mg four times daily for 10 to 14 days, followed by ? 125 mg twice daily for 7 days, followed by ? 125 mg once daily for 7 days, followed by ? 125 mg every other day for 7 days, followed by ? 125 mg every 3 days for 2 to 8 weeks   Stool volume continues to gradually improve (400 mL >180 mL >150 mL) and so has consistency.  No documentation for 24 hours regarding stools and consistency  Relative Adrenal insufficiency / Hypotension Was given a course of stress dose steroids - remains hemodynamically stable on midodrine. Coreg, Lisinopril remains on hold.  Acute Acalculous Colecystitis  Abdominal US noted gallbladder sludge/diffuse thickening - +Murphy sign on exam - 12/15/16  S/P percutaneous cholecystostomy drain > capped 12/25/16 w/ IR directing care of this issue. Appetite is reportedly improving.  Elevated LFTs and ammonialevel LFTs and Tbil now beginning to significantly trend downward - recheck intermittently. Lipitor on hold. LFTs have essentially normalized and will need a discussion as an outpatient regarding resumption of statin medication Repeat labs a.m..  Acute respiratory failure with hypoxia / Chronic Asbestosis Resolved w/ pt now on RA.   Chronic Systolic and Diastolic CHF/CAD/H/O MI S/P DES 02/2016. TTE 05/14/16 EF45-50% w/ grade 1 diastolic dysfunction, and repeat this admit essentially unchanged - anasarca is primarily due to low albumin creating the scenario of intravascular volume depletion despite  total body volume overload  - he remains stable w/o significant pulmonary edema - continue low dose Lasix 20 daily  Family anxious about  anasarca. Have repetitively  been reassured that it may take awhile to resolve.  Continue ASA. Brilinta Resumed on 10/10  Obesity/Moderate protein calorie malnutrition anasarca Body mass index is 28.97 kg/m. Continue nutritional supplements.  Paroxysmal atrial fibrillation Remains in NSR. DC telemetry 10/4.  Acute renal failure  Resolved with creatinine now normal.  Dysphagia SLP following - family agree the pt would NOT want a PEG - Cortrak now out and pt on a modified diet which he appears to be tolerating well thus far. Speech therapy follow-up appreciated and recommend dysphagia 3 diet and nectar thickened liquids. As per family, appetite slowly improving.  Hypernatremia Corrected w/ free water via IV - cautiously dosing w/ low dose lasix as discussed above . Resolved. Discontinued D5W. Labs in a.m.  Hypoglycemia Resolved.  Peripheral edema Due to above noted issues - pt having persisting neuropathic type pain in R foot since UNNA boot placed - UNNA discontinued 01/03/17.  Avoid neurontin or lyrica due to high risk of AMS.  BPH/urinary retention Flomax 0.4 mg daily. Still had Foley catheter. As per family, this was placed during this hospitalization. Patient had issues with urinary retention in the past requiring prolonged Foley catheter but had been removed.  Discontinued Foley catheter on 10/5. However after one or 2 days of trial without the Foley catheter, again developed urinary retention and Foley catheter had to be placed back on 10/8. He will have to discharge with Foley catheter and will need outpatient Urology consultation and follow-up for evaluation and management.  Anemia Stable. Likely secondary to acute illness and chronic disease.  Thrombocytopenia - Likely due to acute illness and antimicrobials. Platelets have trended down to 77.? Related to C. difficile. Does not seem to have received heparin during this admission. Platelets seem to be slightly better.  Follow CBCs closely.  Hypokalemia/hypomagnesemia - Secondary to diarrhea. Replaced. Periodically follow.   Adult failure to thrive - Multifactorial secondary to advanced age, frail health, multiple significant severe comorbidities and prolonged hospitalization. Have been updating family with prognosis and likelihood of very slow recovery and high chance of recurrent decline. Palliative care team input appreciated on 10/9    Family Communication/Anticipated D/C date and plan/Code Status   DVT prophylaxis: SCDs ordered. Code Status: DNR Family Communication:  Met with multiple family members 10/10 answered all questions as best as I could It is noted that the patient's son has been verbally abusive and aggressive on occasion to staff--he has not exhibited this behavior in my interactions with him however this will not be tolerated if this recurs and patient will be escorted off of the campus by police if it does recur  Disposition Plan: SNF when medically stable. Still tenuous.   Medical Consultants:    IR  PCCM  Orthopedics  GI  Palliative Care   Cardiology    Anti-Infectives:    Diflucan 12/27/16--->10/2  Oral Vanc 9/18 (effective) --->  Subjective:   Doing fair no new issue Patient'sfamily at the bedside Hearing aids do not work however he does not indicate to me any specific discomfort or pain but is still having some diarrhea  Objective:    Vitals:   01/12/17 0624 01/12/17 1502 01/12/17 2118 01/13/17 0528  BP: (!) 93/51 (!) 96/50 (!) 106/55 (!) 92/53  Pulse: 72 74 78 81  Resp: '16 16 18 18  ' Temp: 97.8  F (36.6 C) 97.6 F (36.4 C) 97.9 F (36.6 C) 97.9 F (36.6 C)  TempSrc: Oral Oral Oral Oral  SpO2: 96% 90% 99% 95%  Weight:      Height:        Intake/Output Summary (Last 24 hours) at 01/13/17 1732 Last data filed at 01/13/17 1419  Gross per 24 hour  Intake              480 ml  Output             1200 ml  Net             -720 ml   Filed  Weights   01/08/17 0427 01/09/17 2229 01/11/17 0602  Weight: 99.9 kg (220 lb 3.8 oz) 101.1 kg (222 lb 12.8 oz) 99.6 kg (219 lb 9.6 oz)    Exam:   Alert pleasant very hard of hearing EOMI NCAT Poor dentition No JVD Abdomen soft nontender, no rebound Chest has some crepitations in the lower posterior lung Trulson Trachea is midline S1 and S2 no murmur Left leg is wrapped in an Unna boot He has anasarca   Data Reviewed:   I have personally reviewed following labs and imaging studies:  Labs: Labs reviewed 01/05/17 show the following:   Basic Metabolic Panel:  Recent Labs Lab 01/07/17 0530 01/08/17 0617 01/09/17 0442 01/10/17 0504 01/12/17 0528  NA 137 135 139 136 138  K 3.1* 4.7 4.4 4.1 3.3*  CL 102 105 102 101 100*  CO2 '27 23 29 27 31  ' GLUCOSE 109* 84 118* 108* 88  BUN '20 19 20 ' 22* 24*  CREATININE 0.76 0.75 0.87 0.77 0.82  CALCIUM 7.5* 7.4* 7.7* 7.7* 7.6*  MG  --  1.6* 1.6* 1.9 1.6*   GFR Estimated Creatinine Clearance: 84.8 mL/min (by C-G formula based on SCr of 0.82 mg/dL). Liver Function Tests:  Recent Labs Lab 01/07/17 0530 01/09/17 0442 01/10/17 0504 01/12/17 0528  AST 54* 47* 37 33  ALT 65* 58 48 37  ALKPHOS 119 133* 132* 109  BILITOT 3.2* 3.1* 3.2* 2.7*  PROT 4.3* 4.7* 4.9* 4.3*  ALBUMIN 1.7* 1.9* 1.9* 1.7*   No results for input(s): AMMONIA in the last 168 hours. CBC:  Recent Labs Lab 01/07/17 0530 01/08/17 0617 01/09/17 0442 01/10/17 0504 01/12/17 0528  WBC 4.7 7.8 7.0 9.0 7.1  HGB 8.9* 11.0* 10.2* 10.4* 9.9*  HCT 28.7* 36.1* 32.9* 33.3* 32.0*  MCV 104.0* 104.3* 103.8* 102.1* 102.6*  PLT 77* 73* 82* 82* 101*   CBG:  Recent Labs Lab 01/10/17 1130 01/12/17 1205 01/12/17 1656 01/13/17 1310 01/13/17 1640  GLUCAP 119* 160* 176* 164* 122*   Microbiology No results found for this or any previous visit (from the past 240 hour(s)).  Procedures and diagnostic studies:  No results found.  Medications:   . aspirin  81 mg Oral  Daily  . chlorhexidine  15 mL Mouth Rinse BID  . Chlorhexidine Gluconate Cloth  6 each Topical Daily  . feeding supplement (ENSURE ENLIVE)  237 mL Oral TID WC  . furosemide  20 mg Intravenous Daily  . magic mouthwash  5 mL Oral TID  . mouth rinse  15 mL Mouth Rinse q12n4p  . midodrine  10 mg Oral TID WC  . potassium chloride  40 mEq Oral Daily  . saccharomyces boulardii  250 mg Oral BID  . tamsulosin  0.4 mg Oral Daily  . ticagrelor  90 mg Oral BID  . vancomycin  125  mg Oral QID  . [START ON 01/21/2017] vancomycin  125 mg Oral Q12H   Followed by  . [START ON 01/28/2017] vancomycin  125 mg Oral Daily   Followed by  . [START ON 02/04/2017] vancomycin  125 mg Oral Q48H   Followed by  . [START ON 02/12/2017] vancomycin  125 mg Oral Q72H   Continuous Infusions:    LOS: 41 days   Nita Sells, MD, FACP, FHM. [facility] Pager [phone]  If 7PM-7AM, please contact night-coverage [email or web address] Password [email or web address] 01/13/2017, 5:32 PM

## 2017-01-13 NOTE — Consult Note (Signed)
SLP Cancellation Note  Patient Details Name: Danny Oneill MRN: [id number] DOB: Apr 27, 1931   Cancelled treatment:        Unable to assess diet tolerance as pt is currently unavailable. Per RN, pt has decreased appetite, but is tolerating nectar thick liquids. Meds tolerated as well. DC possibly tomorrow. ST will continue efforts.  Celia B. Murvin Natal [facility], CCC-SLP Speech Language Pathologist [phone]  Danny Oneill 01/13/2017, 2:54 PM

## 2017-01-13 NOTE — Social Work (Signed)
CSW called [facility] to re-initiate Insurance Auth for SNF placement.  Pt has bed offer from [facility] which family declined . CSW called [facility] in admission for [name] and she said she may have a bed Thursday but she will let CSW know tomorrow.  She indicated that she advised family of that this week as well.  CSW resent clinicals to her to review again.  [name], LCSW Clinical Social Worker [phone]

## 2017-01-13 NOTE — Progress Notes (Signed)
Patient has had 2 incontinent  bowel movements,  this shift. First occurrence was small, brown, #6 on the [facility] chart.  Second stool this PM was incontinent also. Moderate also #6 on [place] scale, brown in color.   Appetite remains fair, took 2 bottles of Ensure this shift along with meal percentage eaten from meal trays.

## 2017-01-13 NOTE — Progress Notes (Signed)
Dr. Mahala Menghini on unit to see patient. RN assisted in room with MD. MD was able to converse with patient on how he slept and how he is feeling today.   2 sons of patient present in room with cell phones out. Family reminded not to record conversations as it is a HIPPA violation of the patient. Family denied recordings.... No other issues while attending with MD.

## 2017-01-13 NOTE — Care Management (Addendum)
Case manager met with patient's family and Dr. Domingo Cocking to discuss discharge plan and Medicare appeal process. Danny Oneill, patient's son very rude and belligerent and used his cell to record conversation . CM did have security called. CM provided daughter Danny Oneill with copy of IM, she plans to file appeal. Family wants patient to remain hospitalized until they get a bed at a facility of their choice, social worker has explained that patient is medically ready for discharge, as has  Dr. Verlon Au. CM and social worker have updated Therapist, sports, Voncille Lo and medical Advisor, Dr. Reynaldo Minium.

## 2017-01-13 NOTE — Care Management (Signed)
Mosie Lukes says that patient representative did not file appeal until around 11:15am on 01/13/17.

## 2017-01-14 LAB — PROTIME-INR
INR: 1.32
Prothrombin Time: 16.3 seconds — ABNORMAL HIGH (ref 11.4–15.2)

## 2017-01-14 LAB — CBC
HEMATOCRIT: 33.1 % — AB (ref 39.0–52.0)
HEMOGLOBIN: 10.2 g/dL — AB (ref 13.0–17.0)
MCH: 31.6 pg (ref 26.0–34.0)
MCHC: 30.8 g/dL (ref 30.0–36.0)
MCV: 102.5 fL — AB (ref 78.0–100.0)
Platelets: 140 10*3/uL — ABNORMAL LOW (ref 150–400)
RBC: 3.23 MIL/uL — ABNORMAL LOW (ref 4.22–5.81)
RDW: 21.4 % — AB (ref 11.5–15.5)
WBC: 10.5 10*3/uL (ref 4.0–10.5)

## 2017-01-14 LAB — GLUCOSE, CAPILLARY
GLUCOSE-CAPILLARY: 84 mg/dL (ref 65–99)
Glucose-Capillary: 147 mg/dL — ABNORMAL HIGH (ref 65–99)
Glucose-Capillary: 133 mg/dL — ABNORMAL HIGH (ref 65–99)
Glucose-Capillary: 116 mg/dL — ABNORMAL HIGH (ref 65–99)

## 2017-01-14 LAB — COMPREHENSIVE METABOLIC PANEL
ALBUMIN: 1.9 g/dL — AB (ref 3.5–5.0)
ALT: 36 U/L (ref 17–63)
ANION GAP: 8 (ref 5–15)
AST: 33 U/L (ref 15–41)
Alkaline Phosphatase: 168 U/L — ABNORMAL HIGH (ref 38–126)
BILIRUBIN TOTAL: 2.6 mg/dL — AB (ref 0.3–1.2)
BUN: 23 mg/dL — AB (ref 6–20)
CHLORIDE: 99 mmol/L — AB (ref 101–111)
CO2: 29 mmol/L (ref 22–32)
Calcium: 7.8 mg/dL — ABNORMAL LOW (ref 8.9–10.3)
Creatinine, Ser: 0.88 mg/dL (ref 0.61–1.24)
GFR calc Af Amer: >60 mL/min (ref >60)
GFR calc non Af Amer: >60 mL/min (ref >60)
GLUCOSE: 87 mg/dL (ref 65–99)
POTASSIUM: 4.4 mmol/L (ref 3.5–5.1)
SODIUM: 136 mmol/L (ref 135–145)
TOTAL PROTEIN: 4.8 g/dL — AB (ref 6.5–8.1)

## 2017-01-14 MED ORDER — GERHARDT'S BUTT CREAM
TOPICAL_CREAM | Freq: Every day | CUTANEOUS | Status: DC
  Administered 2017-01-14 – 2017-01-15 (×2): via TOPICAL
  Filled 2017-01-14: qty 1

## 2017-01-14 MED ORDER — ENSURE ENLIVE PO LIQD
237.0000 mL | Freq: Four times a day (QID) | ORAL | Status: DC
  Administered 2017-01-14 – 2017-01-15 (×3): 237 mL via ORAL

## 2017-01-14 NOTE — Social Work (Signed)
CSW f/u with [facility] and they have declined to offer a bed. They indicated he is not a good therapy candidate and they do not have any beds.  [name] do not have any beds today, per admission. [facility] declined to offer a bed again as admission indicated that family does not want to come to SNF due to past experience. CSW called [facility] again and they indicated they may not have a bed until Monday. CSW f/u with [facility] who indicated tht they would f/u with CSW in the morning. [name] has declined patient on Tuesday and will not reconsider.  CSW has no bed offers at this time.  CSW will f/u again tomorrow with [name] and [facility].  3:45pm: CSW received call back from [facility] in admissions confirming that she will have a bed tomorrow afternoon. SNF advised that daughter contacted them directly and they discussed the need in a lengthy conversation.  CSW confirmed bed with daughter who agreed to same. CSW advised Dr. Mahala Menghini of discharge tomorrow.  [name], LCSW Clinical Social Worker [phone]

## 2017-01-14 NOTE — Progress Notes (Signed)
Patient had 3 loose stools on day shift today. All light brown, small to medium in size, #6 on bristol chart x2, and 1 #7 on bristol chart. Peri care performed, cream applied, dressing on sacrum changed each time.

## 2017-01-14 NOTE — Progress Notes (Signed)
Physical Therapy Treatment Patient Details Name: Danny Oneill MRN: [id number] DOB: 11/16/1931 Today's Date: 01/14/2017    History of Present Illness Pt is an 83 yo male admitted on 8/30 with L hip fracture. He developed hypoxia and hypotension after surgery (L IM nail) on 9/4 and required re-intubation 9/4-9/6. Pt with septic shock from UTI, then found to have c diff, also with acute acaclulous colecystitis s/p perc drain 9/11.   PMH positive for MI, cardiomyopathy, CHF, first degree AV block, PAF, CKD III, bladder outlet obstruction, lumbar spondylosis, and TKA.    PT Comments    Pt pleasant and willing to mobilize today although notes that both legs are sore. Obvious pitting edema through legs and trunk with ROm limited in knees and hips by edema. Pt educated for transfers and HEP with family present and educated for [facility] and encouraged to initiate HEP throughout the day with family verbalizing understanding. Will continue to follow to maximize strength and function.    Follow Up Recommendations  SNF;Supervision/Assistance - 24 hour     Equipment Recommendations  Wheelchair cushion (measurements PT);Wheelchair (measurements PT);Hospital bed    Recommendations for Other Services       Precautions / Restrictions Precautions Precautions: Fall Precaution Comments:  c-diff Restrictions Weight Bearing Restrictions: Yes LLE Weight Bearing: Partial weight bearing LLE Partial Weight Bearing Percentage or Pounds: 50    Mobility  Bed Mobility Overal bed mobility: Needs Assistance Bed Mobility: Rolling;Supine to Sit Rolling: Max assist   Supine to sit: Max assist;+2 for physical assistance     General bed mobility comments: max assist with reaching for rail to perform pericare and pad placement. Max assist +2 to pivot to EOB with HOB 30degrees, use of pad and increased time with posterior lean limiting pt function with increased time to achieve any anterior translation. Pt EOB 4 min  with assist for balance and trunk control   Transfers Overall transfer level: Needs assistance               General transfer comment: Bed to chair via maxisky lift  Ambulation/Gait                 Stairs            Wheelchair Mobility    Modified Rankin (Stroke Patients Only)       Balance Overall balance assessment: Needs assistance   Sitting balance-Leahy Scale: Zero Sitting balance - Comments: max assist for balance EOB 4 min Postural control: Posterior lean                                  Cognition Arousal/Alertness: Awake/alert Behavior During Therapy: Flat affect Overall Cognitive Status: Impaired/Different from baseline Area of Impairment: Attention;Following commands;Awareness;Problem solving                   Current Attention Level: Selective   Following Commands: Follows one step commands inconsistently;Follows one step commands with increased time     Problem Solving: Slow processing;Requires verbal cues;Decreased initiation        Exercises General Exercises - Lower Extremity Long Arc Quad: AAROM;Both;Seated;10 reps Heel Slides: AAROM;Both;Supine;10 reps Hip Flexion/Marching: AAROM;Both;Seated;10 reps    General Comments        Pertinent Vitals/Pain Pain Assessment: Faces Faces Pain Scale: Hurts little more Pain Location: bil LE with movement Pain Descriptors / Indicators: Sore;Grimacing Pain Intervention(s): Limited activity within patient's tolerance;Repositioned;Monitored during session  Home Living                      Prior Function            PT Goals (current goals can now be found in the care plan section) Acute Rehab PT Goals Time For Goal Achievement: 01/28/17 Potential to Achieve Goals: Fair Progress towards PT goals: Progressing toward goals;Goals met and updated - see care plan (slowly)    Frequency    Min 2X/week      PT Plan Current plan remains appropriate     Co-evaluation              AM-PAC PT "6 Clicks" Daily Activity  Outcome Measure  Difficulty turning over in bed (including adjusting bedclothes, sheets and blankets)?: Unable Difficulty moving from lying on back to sitting on the side of the bed? : Unable Difficulty sitting down on and standing up from a chair with arms (e.g., wheelchair, bedside commode, etc,.)?: Unable Help needed moving to and from a bed to chair (including a wheelchair)?: Total Help needed walking in hospital room?: Total Help needed climbing 3-5 steps with a railing? : Total 6 Click Score: 6    End of Session   Activity Tolerance: Patient tolerated treatment well Patient left: in chair;with call bell/phone within One[place];with family/visitor present;with nursing/sitter in room Nurse Communication: Mobility status;Need for lift equipment PT Visit Diagnosis: Muscle weakness (generalized) (M62.81);Pain;Other abnormalities of gait and mobility (R26.89) Pain - Right/Left: Left Pain - part of body: Hip     Time: 0902-0940 PT Time Calculation (min) (ACUTE ONLY): 38 min  Charges:  $Therapeutic Exercise: 8-22 mins $Therapeutic Activity: 23-37 mins                    G Codes:       Danny Oneill, PT [phone]    [place] Kamil Hanigan 01/14/2017, 10:22 AM

## 2017-01-14 NOTE — Consult Note (Signed)
WOC Nurse wound follow up Wound type: Stage 2 pressure injury Measurement: 1.0cm x 1.0cm x 0.1cm  Wound bed: pink with some yellow at the edge, does not need enzyme at this time Drainage (amount, consistency, odor) minimal, non purulent  Periwound: intact, blanchable redness  Dressing procedure/placement/frequency: Continue foam to protect pressure injury, will add Gerhardts cream to the other affected areas.  LALM in place for moisture management and pressure redistribution  WOC Nurse team will follow along with you for weekly wound assessments.  Please notify me of any acute changes in the wounds or any new areas of concerns Ranvir Renovato [facility] MSN, RN,CWOCN, CNS, CWON-AP [phone]

## 2017-01-14 NOTE — Progress Notes (Signed)
Patient had 1 incontient stool this shift thus far. Medium light brown in color #7 on bristol chart. Peri care given and patient positioned for comfort.

## 2017-01-14 NOTE — Care Management Note (Signed)
Case Management Note  Patient Details  Name: Kohan Denbo MRN: [id number] Date of Birth: 03-20-1932  Subjective/Objective:  Pt is an 83 yo male admitted on 8/30 with L hip fracture. He developed hypoxia and hypotension after Left IM nailing on 9/4 and required re-intubation 9/4-9/6. Pt with septic shock from UTI, then found to have c diff, also with acute acaclulous colecystitis s/p perc drain 9/11.                   Action/Plan: Case manager has been following closely for disposition for patient. Patient will need SNF for rehab. Family filed appeal with [employer] on 01/12/17 which was denied on 01/14/17. Case manager spoke with Enid Derry, patient's daughter concerning discharge plan at this point. She has asked that social worker contact [name] and [name], she realizes that patient has a discharge order and will discuss with her family how they are going to proceed.  Discussed private pay for nursing. Case Manager updated Dr.Samtani, will enter orders for Home Health and DME if needed. CM continuing to monitor.   Expected Discharge Date:  01/12/17               Expected Discharge Plan:   pending  In-House Referral:  Clinical Social Work  Discharge planning Services  CM Consult  Post Acute Care Choice:  Durable Medical Equipment Choice offered to:  Adult Children  DME Arranged:  Hospital bed DME Agency:     HH Arranged:  PT, OT, Nurse's Aide HH Agency:  [facility]  Status of Service:  In process, will continue to follow  If discussed at Long Length of Stay Meetings, dates discussed:    Additional Comments:  Durenda Guthrie, RN 01/14/2017, 2:10 PM

## 2017-01-14 NOTE — Progress Notes (Signed)
Nutrition Follow-up  DOCUMENTATION CODES:   Non-severe (moderate) malnutrition in context of chronic illness  INTERVENTION:  Increase Ensure Enlive po to QID (thickened to nectar thick consistency), each supplement provides 350 kcal and 20 grams of protein.  Encourage adequate PO intake.   NUTRITION DIAGNOSIS:   Malnutrition (moderate) related to chronic illness (CAD, CKD) as evidenced by mild depletion of body fat, mild depletion of muscle mass, percent weight loss (9% weight loss in 3 months); ongoing  GOAL:   Patient will meet greater than or equal to 90% of their needs; progressing  MONITOR:   PO intake, Supplement acceptance, Weight trends, Labs, Skin  REASON FOR ASSESSMENT:   Consult Enteral/tube feeding initiation and management  ASSESSMENT:   83 yo male with PMH of HLD, lumbar spondylosis, MI, CAD, PAF, cardiomyopathy, CKD, CHF who was admitted on 8/30 with L hip fracture. He developed hypoxia and hypotension after surgery (L IM nail) on 9/4 and required re-intubation and transfer to the ICU.  Pt continues on a dysphagia 3 diet with nectar thick consistency. Meal completion has been varied from 25-75% with meal intake of 35% at lunch today. Pt currently has Ensure ordered with varied consumption. RD to increase orders to QID to maximize PO intake. Family at bedside reports they have been encouraging pt to eat at meals.    Labs and medications reviewed.   Diet Order:  DIET DYS 3 Room service appropriate? Yes; Fluid consistency: Nectar Thick Diet - low sodium heart healthy  Skin:  Wound (see comment) (Stage 2 sacral pressure injury)  Last BM:  10/11  Height:   Ht Readings from Last 1 Encounters:  12/23/16 6\' 1"  (1.854 m)    Weight:   Wt Readings from Last 1 Encounters:  01/11/17 219 lb 9.6 oz (99.6 kg)    Ideal Body Weight:  83.6 kg  BMI:  Body mass index is 28.97 kg/m.  Estimated Nutritional Needs:   Kcal:  1900-2250 kcals  Protein:  115-136 g    Fluid:  2 L  EDUCATION NEEDS:   No education needs identified at this time  Roslyn Smiling, MS, RD, LDN Pager # [phone] After hours/ weekend pager # [phone]

## 2017-01-14 NOTE — Progress Notes (Signed)
Progress Note    Djibril Glogowski  GYI:[id number] DOB: 10-19-1931  DOA: 12/03/2016 PCP: Burnard Bunting, MD    Brief Narrative:   Chief complaint: F/U sepsis, hip fracture  Medical records reviewed and are as summarized below:  Danny Oneill is an 83 y.o. male with a PMH of asbestosis, CAD, chronic CHF, first-degree AV block, PAF, HLD, bladder outlet obstruction and stage III CKD who was admitted for treatment of a hip fracture status post mechanical fall.  Significant Events: 8/30 admit after mechanical fall > hip fx  9/4 intubated - S/P LEFT intertrochantric intramedullary nailing - tx to ICU for peri-op hypotension 9/6 extubated 9/8 [place] - negative gallstones, slight sludge in the gallbladder and there is slight diffuse thickening of the gallbladder wall. Negative sonographic Murphy's sign. 9/10 GI, Surgery, and IR consulted  9/11 cholecystostomy tubeplaced 9/21 IR cappedpercutaneous cholecystostomy drain 9/25 Palliative Care met w/ family  9/28 Cards consulted at family request for edema  9/30 transfer to Ortho floor - MS improving    Assessment/Plan:   Principal Problem:   Closed left hip fracture ([place]) S/P LEFT intertrochanteric intramedullary nailing 12/08/16 - transferred to ortho floor 01/03/17 to allow better access to PT/OT needs.   Seen PT working with him today and requires lots of assistance. SNF at discharge.  Physically deconditioned. Clinical social work following--has SNF bed for tomorrow?  Oral candidiasis Completed 10 day course of Diflucan. Also on MMW.  Acute metabolic encephalopathy. Probable hospital acquired/ICU related delirium Multifactorial:  septic shock, high dose steroids, hypernatremia, mild uremia, sleep deprivation. limitsedatives and narcotics as able.   Zyprexa discontinued earlier in admission Mental status significantly improved over the last 3-4 days.  Septic shock due to E coli UTI Shock resolved.  Follow-up blood and urine  cultures were negative. Has completed his course of antibiotics.  C diff colitis Completed 2 week course PO Vancomycin on 2/26. Ongoing diarrhea - continue flexiseal as the patient still has diarrhea. Added probiotics.  Dr. Michel Bickers who recommended pulse oral vancomycin taper as follows, started 01/07/17. Please see discharge summary for details  Stool volume has steadily decreased and patient is eating more of a diet Appreciate nursing input regardingstool output  Relative Adrenal insufficiency / Hypotension Was given a course of stress dose steroids - remains hemodynamically stable on midodrine.  Coreg, Lisinopril remains on hold.  Acute Acalculous Colecystitis  Abdominal US noted gallbladder sludge/diffuse thickening - +Murphy sign on exam - 12/15/16  S/P percutaneous cholecystostomy drain > capped 12/25/16 w/ IR directing care of this issue. Appetite is reportedly improving.  Elevated LFTs and ammonialevel LFTs and Tbil now beginning to significantly trend downward - recheck intermittently.  Lipitor on hold.  LFTs have essentially normalized and will need a discussion as an outpatient regarding resumption of statin medication  Acute respiratory failure with hypoxia / Chronic Asbestosis Resolved w/ pt now on RA.   Chronic Systolic and Diastolic CHF/CAD/H/O MI S/P DES 02/2016. TTE 05/14/16 EF45-50% w/ grade 1 diastolic dysfunction, and repeat this admit essentially unchanged - anasarca is primarily due to low albumin creating the scenario of intravascular volume depletion despite total body volume overload  - he remains stable w/o significant pulmonary edema - continue low dose Lasix 20 daily--will take a long time to resolve anasarca Continue ASA. Brilinta Resumed on 10/10  Obesity/Moderate protein calorie malnutrition anasarca Body mass index is 28.97 kg/m. Continue nutritional supplements.  Paroxysmal atrial fibrillation Remains in NSR. DC telemetry 10/4.  Acute  renal  failure  Resolved with creatinine now normal.  Dysphagia SLP following - family agree the pt would NOT want a PEG - Cortrak now out and pt on a modified diet which he appears to be tolerating well thus far.  Speech therapy follow-up appreciated and recommend dysphagia 3 diet and nectar thickened liquids.  As per family, appetite slowly improving.  Hypernatremia Corrected w/ free water via IV - cautiously dosing w/ low dose lasix as discussed above . Resolved.  Discontinued D5W.  Hypoglycemia Resolved.  Peripheral edema Due to above noted issues   pt having persisting neuropathic type pain in R foot since UNNA boot placed  Avoid neurontin or lyrica due to high risk of AMS.  BPH/urinary retention Flomax 0.4 mg daily. Still had Foley catheter. As per family, this was placed during this hospitalization. Patient had issues with urinary retention in the past requiring prolonged Foley catheter but had been removed.  Discontinued Foley catheter on 10/5. However after one or 2 days of trial without the Foley catheter, again developed urinary retention and Foley catheter had to be placed back on 10/8. He will have to discharge with Foley catheter and will need outpatient Urology consultation and follow-up for evaluation and management.  Anemia Stable. Likely secondary to acute illness and chronic disease.  Thrombocytopenia - Likely due to acute illness and antimicrobials. Platelets have trended down to 77.? Related to C. difficile. Does not seem to have received heparin during this admission. Platelets have trended up from 70-140  Hypokalemia/hypomagnesemia - Secondary to diarrhea. Replaced. Periodically follow.   Adult failure to thrive - Multifactorial secondary to advanced age, frail health, multiple significant severe comorbidities and prolonged hospitalization. Have been updating family with prognosis and likelihood of very slow recovery and high chance of recurrent decline.  Palliative care team input appreciated on 10/9    Family Communication/Anticipated D/C date and plan/Code Status   DVT prophylaxis: SCDs ordered. Code Status: DNR Family Communication:  Discussed with daughter Arbie Cookey at the bedside and is for skilled nursing placement tomorrow 10/12  Disposition Plan: SNF when medically stable. Still tenuous.   Medical Consultants:    IR  PCCM  Orthopedics  GI  Palliative Care   Cardiology    Anti-Infectives:    Diflucan 12/27/16--->10/2  Oral Vanc 9/18 (effective) --->  Subjective:   alert out of the bed in a chair and looks stronger however very hard of hearing without his hearing aids No overt complaint somewhat slow mentation but seems to understand the gist of our discussion He is ready for therapy he says   I spent a long time chatting with the daughter about disposition and current issues  Objective:    Vitals:   01/13/17 2159 01/14/17 0632 01/14/17 1421 01/14/17 1457  BP: (!) 98/55 (!) 96/53 (!) 85/41 (!) 93/55  Pulse: 76 77    Resp: '18 18 16   ' Temp: 97.9 F (36.6 C) 98.6 F (37 C) 98.7 F (37.1 C)   TempSrc: Oral Oral Oral   SpO2: 100% 97% 98%   Weight:      Height:        Intake/Output Summary (Last 24 hours) at 01/14/17 1606 Last data filed at 01/14/17 1420  Gross per 24 hour  Intake              680 ml  Output             2300 ml  Net            -  1620 ml   Filed Weights   01/08/17 0427 01/09/17 2229 01/11/17 0602  Weight: 99.9 kg (220 lb 3.8 oz) 101.1 kg (222 lb 12.8 oz) 99.6 kg (219 lb 9.6 oz)    Exam:   Hard of hearing Looking brighter Chest clear S1-S2 no murmur Abdomen soft Has anasarca with pitting edema in the legs and abdomen Some moisture associated dermatitis Extraocular movements are intact Mucosa slightly dry   Data Reviewed:   I have personally reviewed following labs and imaging studies:  Labs: Labs reviewed 01/05/17 show the following:   Basic Metabolic  Panel:  Recent Labs Lab 01/08/17 0617 01/09/17 0442 01/10/17 0504 01/12/17 0528 01/14/17 0535  NA 135 139 136 138 136  K 4.7 4.4 4.1 3.3* 4.4  CL 105 102 101 100* 99*  CO2 '23 29 27 31 29  ' GLUCOSE 84 118* 108* 88 87  BUN 19 20 22* 24* 23*  CREATININE 0.75 0.87 0.77 0.82 0.88  CALCIUM 7.4* 7.7* 7.7* 7.6* 7.8*  MG 1.6* 1.6* 1.9 1.6*  --    GFR Estimated Creatinine Clearance: 79 mL/min (by C-G formula based on SCr of 0.88 mg/dL). Liver Function Tests:  Recent Labs Lab 01/09/17 0442 01/10/17 0504 01/12/17 0528 01/14/17 0535  AST 47* 37 33 33  ALT 58 48 37 36  ALKPHOS 133* 132* 109 168*  BILITOT 3.1* 3.2* 2.7* 2.6*  PROT 4.7* 4.9* 4.3* 4.8*  ALBUMIN 1.9* 1.9* 1.7* 1.9*   No results for input(s): AMMONIA in the last 168 hours. CBC:  Recent Labs Lab 01/08/17 0617 01/09/17 0442 01/10/17 0504 01/12/17 0528 01/14/17 0535  WBC 7.8 7.0 9.0 7.1 10.5  HGB 11.0* 10.2* 10.4* 9.9* 10.2*  HCT 36.1* 32.9* 33.3* 32.0* 33.1*  MCV 104.3* 103.8* 102.1* 102.6* 102.5*  PLT 73* 82* 82* 101* 140*   CBG:  Recent Labs Lab 01/13/17 1310 01/13/17 1640 01/13/17 2157 01/14/17 0633 01/14/17 1144  GLUCAP 164* 122* 182* 84 147*   Microbiology No results found for this or any previous visit (from the past 240 hour(s)).  Procedures and diagnostic studies:  No results found.  Medications:   . aspirin  81 mg Oral Daily  . chlorhexidine  15 mL Mouth Rinse BID  . Chlorhexidine Gluconate Cloth  6 each Topical Daily  . feeding supplement (ENSURE ENLIVE)  237 mL Oral TID WC  . furosemide  20 mg Intravenous Daily  . Gerhardt's butt cream   Topical Daily  . magic mouthwash  5 mL Oral TID  . mouth rinse  15 mL Mouth Rinse q12n4p  . midodrine  10 mg Oral TID WC  . potassium chloride  40 mEq Oral Daily  . saccharomyces boulardii  250 mg Oral BID  . tamsulosin  0.4 mg Oral Daily  . ticagrelor  90 mg Oral BID  . vancomycin  125 mg Oral QID  . [START ON 01/21/2017] vancomycin  125 mg  Oral Q12H   Followed by  . [START ON 01/28/2017] vancomycin  125 mg Oral Daily   Followed by  . [START ON 02/04/2017] vancomycin  125 mg Oral Q48H   Followed by  . [START ON 02/12/2017] vancomycin  125 mg Oral Q72H   Continuous Infusions:    LOS: 42 days   Nita Sells, MD, FACP, FHM. [facility] Pager [phone]  If 7PM-7AM, please contact night-coverage [email or web address] Password [email or web address] 01/14/2017, 4:06 PM

## 2017-01-14 NOTE — Care Management (Signed)
Patient will have a bed at [name] on tomorrow, 01/14/17. Social worker is working on this.

## 2017-01-14 NOTE — Progress Notes (Signed)
  Speech Language Pathology Treatment: Dysphagia  Patient Details Name: Danny Oneill MRN: [id number] DOB: [birth year] Today's Date: 01/14/2017 Time: 5997-7414 SLP Time Calculation (min) (ACUTE ONLY): 10 min  Assessment / Plan / Recommendation Clinical Impression  Pt seen at bedside for assessment of diet tolerance and education. RN reports pt tolerating current diet; especially enjoys Ensure, but pt requires cues to limit bolus size and rate. No s/s aspiration reported when taking small bites/sips. RN reports clear/diminished lungs, no temp spikes. Pt declined po trials at this time, indicating he had just finished an Ensure, and was "worn out". Safe swallow precautions (posted at [facility]) were reviewed with pt.   DC is pending placement. Recommend follow up with SLP for possible diet advancement and staff education, given history of silent aspiration.    HPI HPI: 83 yo male with PMH of HLD, lumbar spondylosis, MI, CAD, PAF, cardiomyopathy, CKD, CHF who was admitted on 8/30 with L hip fracture. He developed hypoxia and hypotension after surgery (L IM nail) on 9/4 and required re-intubation 9/4-9/6. Pt with septic shock from UTI, then found to have c diff, also with acute acaclulous colecystitis s/p perc drain 9/11. SLP ordered for swallow evaluation as pt's alertness began to improve.      SLP Plan  Continue with current plan of care       Recommendations  Diet recommendations: Dysphagia 3 (mechanical soft);Nectar-thick liquid Liquids provided via: Cup;No straw Medication Administration: Crushed with puree Supervision: Staff to assist with self feeding Compensations: Slow rate;Small sips/bites;Effortful swallow;Clear throat intermittently;Minimize environmental distractions Postural Changes and/or Swallow Maneuvers: Seated upright 90 degrees                Oral Care Recommendations: Oral care QID (with suction) Follow up Recommendations: Skilled Nursing facility SLP Visit Diagnosis:  Dysphagia, oropharyngeal phase (R13.12) Plan: Continue with current plan of care       GO              Brinlynn Gorton B. Murvin Natal [facility], CCC-SLP Speech Language Pathologist [phone]  Leigh Aurora 01/14/2017, 2:28 PM

## 2017-01-14 NOTE — Social Work (Signed)
CSW spoke with Crystal at [facility] and she confirmed that patient insurance [phone]  Is still good for discharge to [facility]. CSW will f/u with them is that changes.  [name], LCSW Clinical Social Worker [phone]

## 2017-01-15 DIAGNOSIS — M6281 Muscle weakness (generalized): Secondary | ICD-10-CM | POA: Diagnosis not present

## 2017-01-15 DIAGNOSIS — F0280 Dementia in other diseases classified elsewhere without behavioral disturbance: Secondary | ICD-10-CM | POA: Diagnosis not present

## 2017-01-15 DIAGNOSIS — R578 Other shock: Secondary | ICD-10-CM | POA: Diagnosis not present

## 2017-01-15 DIAGNOSIS — I351 Nonrheumatic aortic (valve) insufficiency: Secondary | ICD-10-CM | POA: Diagnosis not present

## 2017-01-15 DIAGNOSIS — Z96651 Presence of right artificial knee joint: Secondary | ICD-10-CM | POA: Diagnosis not present

## 2017-01-15 DIAGNOSIS — S92153A Displaced avulsion fracture (chip fracture) of unspecified talus, initial encounter for closed fracture: Secondary | ICD-10-CM | POA: Diagnosis not present

## 2017-01-15 DIAGNOSIS — F0390 Unspecified dementia without behavioral disturbance: Secondary | ICD-10-CM | POA: Diagnosis not present

## 2017-01-15 DIAGNOSIS — N183 Chronic kidney disease, stage 3 (moderate): Secondary | ICD-10-CM | POA: Diagnosis not present

## 2017-01-15 DIAGNOSIS — Z7902 Long term (current) use of antithrombotics/antiplatelets: Secondary | ICD-10-CM | POA: Diagnosis not present

## 2017-01-15 DIAGNOSIS — D649 Anemia, unspecified: Secondary | ICD-10-CM | POA: Diagnosis not present

## 2017-01-15 DIAGNOSIS — Z9889 Other specified postprocedural states: Secondary | ICD-10-CM | POA: Diagnosis not present

## 2017-01-15 DIAGNOSIS — I480 Paroxysmal atrial fibrillation: Secondary | ICD-10-CM | POA: Diagnosis not present

## 2017-01-15 DIAGNOSIS — R1310 Dysphagia, unspecified: Secondary | ICD-10-CM | POA: Diagnosis not present

## 2017-01-15 DIAGNOSIS — R5381 Other malaise: Secondary | ICD-10-CM | POA: Diagnosis not present

## 2017-01-15 DIAGNOSIS — Z515 Encounter for palliative care: Secondary | ICD-10-CM | POA: Diagnosis not present

## 2017-01-15 DIAGNOSIS — S72002D Fracture of unspecified part of neck of left femur, subsequent encounter for closed fracture with routine healing: Secondary | ICD-10-CM | POA: Diagnosis not present

## 2017-01-15 DIAGNOSIS — E785 Hyperlipidemia, unspecified: Secondary | ICD-10-CM | POA: Diagnosis not present

## 2017-01-15 DIAGNOSIS — K5791 Diverticulosis of intestine, part unspecified, without perforation or abscess with bleeding: Secondary | ICD-10-CM | POA: Diagnosis not present

## 2017-01-15 DIAGNOSIS — R627 Adult failure to thrive: Secondary | ICD-10-CM | POA: Diagnosis not present

## 2017-01-15 DIAGNOSIS — Z8781 Personal history of (healed) traumatic fracture: Secondary | ICD-10-CM | POA: Diagnosis not present

## 2017-01-15 DIAGNOSIS — Z5189 Encounter for other specified aftercare: Secondary | ICD-10-CM | POA: Diagnosis not present

## 2017-01-15 DIAGNOSIS — M25652 Stiffness of left hip, not elsewhere classified: Secondary | ICD-10-CM | POA: Diagnosis not present

## 2017-01-15 DIAGNOSIS — Z4789 Encounter for other orthopedic aftercare: Secondary | ICD-10-CM | POA: Diagnosis not present

## 2017-01-15 DIAGNOSIS — S79911A Unspecified injury of right hip, initial encounter: Secondary | ICD-10-CM | POA: Diagnosis not present

## 2017-01-15 DIAGNOSIS — Z9189 Other specified personal risk factors, not elsewhere classified: Secondary | ICD-10-CM | POA: Diagnosis not present

## 2017-01-15 DIAGNOSIS — M25552 Pain in left hip: Secondary | ICD-10-CM | POA: Diagnosis not present

## 2017-01-15 DIAGNOSIS — K829 Disease of gallbladder, unspecified: Secondary | ICD-10-CM | POA: Diagnosis not present

## 2017-01-15 DIAGNOSIS — I252 Old myocardial infarction: Secondary | ICD-10-CM | POA: Diagnosis not present

## 2017-01-15 DIAGNOSIS — I2510 Atherosclerotic heart disease of native coronary artery without angina pectoris: Secondary | ICD-10-CM | POA: Diagnosis not present

## 2017-01-15 DIAGNOSIS — R2689 Other abnormalities of gait and mobility: Secondary | ICD-10-CM | POA: Diagnosis not present

## 2017-01-15 DIAGNOSIS — M24562 Contracture, left knee: Secondary | ICD-10-CM | POA: Diagnosis not present

## 2017-01-15 DIAGNOSIS — G9340 Encephalopathy, unspecified: Secondary | ICD-10-CM | POA: Diagnosis not present

## 2017-01-15 DIAGNOSIS — Z66 Do not resuscitate: Secondary | ICD-10-CM | POA: Diagnosis not present

## 2017-01-15 DIAGNOSIS — R1312 Dysphagia, oropharyngeal phase: Secondary | ICD-10-CM | POA: Diagnosis not present

## 2017-01-15 DIAGNOSIS — I959 Hypotension, unspecified: Secondary | ICD-10-CM | POA: Diagnosis not present

## 2017-01-15 DIAGNOSIS — R031 Nonspecific low blood-pressure reading: Secondary | ICD-10-CM | POA: Diagnosis not present

## 2017-01-15 DIAGNOSIS — R600 Localized edema: Secondary | ICD-10-CM | POA: Diagnosis not present

## 2017-01-15 DIAGNOSIS — Z7982 Long term (current) use of aspirin: Secondary | ICD-10-CM | POA: Diagnosis not present

## 2017-01-15 DIAGNOSIS — R278 Other lack of coordination: Secondary | ICD-10-CM | POA: Diagnosis not present

## 2017-01-15 DIAGNOSIS — I440 Atrioventricular block, first degree: Secondary | ICD-10-CM | POA: Diagnosis not present

## 2017-01-15 DIAGNOSIS — K922 Gastrointestinal hemorrhage, unspecified: Secondary | ICD-10-CM | POA: Diagnosis not present

## 2017-01-15 DIAGNOSIS — D62 Acute posthemorrhagic anemia: Secondary | ICD-10-CM | POA: Diagnosis not present

## 2017-01-15 DIAGNOSIS — I255 Ischemic cardiomyopathy: Secondary | ICD-10-CM | POA: Diagnosis not present

## 2017-01-15 DIAGNOSIS — I5042 Chronic combined systolic (congestive) and diastolic (congestive) heart failure: Secondary | ICD-10-CM | POA: Diagnosis not present

## 2017-01-15 LAB — GLUCOSE, CAPILLARY
Glucose-Capillary: 132 mg/dL — ABNORMAL HIGH (ref 65–99)
Glucose-Capillary: 168 mg/dL — ABNORMAL HIGH (ref 65–99)

## 2017-01-15 MED ORDER — GERHARDT'S BUTT CREAM: 1.0000 "application " | TOPICAL_CREAM | Freq: Two times a day (BID) | CUTANEOUS | Status: AC

## 2017-01-15 NOTE — Social Work (Signed)
Clinical Social Worker facilitated patient discharge including contacting patient family and facility to confirm patient discharge plans.  Clinical information faxed to facility and family agreeable with plan.    CSW arranged ambulance transport via [facility] to [name].    RN to call [phone] to give report prior to discharge. Pt going to Room 602P, [facility].  Clinical Social Worker will sign off for now as social work intervention is no longer needed. Please consult [place] again if new need arises.  [name], LCSW Clinical Social Worker [phone]

## 2017-01-15 NOTE — Care Management Note (Signed)
Case Management Note  Patient Details  Name: Danny Oneill MRN: [id number] Date of Birth: January 18, 1932  Subjective/Objective:   Closed Left Hip Fx                 Action/Plan: Discharge Planning: Chart reviewed. Scheduled dc to SNF-rehab today. CSW following for placement.   PCP Geoffry Paradise MD  Expected Discharge Date:  01/15/17               Expected Discharge Plan:  Skilled Nursing Facility  In-House Referral:  Clinical Social Work  Discharge planning Services  CM Consult  Post Acute Care Choice:  Durable Medical Equipment Choice offered to:  Adult Children  DME Arranged:  N/A DME Agency:  NA  HH Arranged:  PT, OT, Nurse's Aide HH Agency:  [facility]  Status of Service:  Completed, signed off  If discussed at Long Length of Stay Meetings, dates discussed:    Additional Comments:  [name], RN 01/15/2017, 11:25 AM

## 2017-01-15 NOTE — Clinical Social Work Placement (Signed)
   CLINICAL SOCIAL WORK PLACEMENT  NOTE  Date:  01/15/2017  Patient Details  Name: Danny Oneill MRN: [id number] Date of Birth: December 11, 1931  Clinical Social Work is seeking post-discharge placement for this patient at the Skilled  Nursing Facility level of care (*CSW will initial, date and re-position this form in  chart as items are completed):  Yes   Patient/family provided with [place] Clinical Social Work Department's list of facilities offering this level of care within the geographic area requested by the patient (or if unable, by the patient's family).  Yes   Patient/family informed of their freedom to choose among providers that offer the needed level of care, that participate in Medicare, Medicaid or managed care program needed by the patient, have an available bed and are willing to accept the patient.  Yes   Patient/family informed of [place]'s ownership interest in [name] and [facility], as well as of the fact that they are under no obligation to receive care at these facilities.  PASRR submitted to EDS on 01/03/17     PASRR number received on       Existing PASRR number confirmed on 01/03/17     FL2 transmitted to all facilities in geographic area requested by pt/family on 01/03/17     FL2 transmitted to all facilities within larger geographic area on       Patient informed that his/her managed care company has contracts with or will negotiate with certain facilities, including the following:        Yes   Patient/family informed of bed offers received.  Patient chooses bed at [name]     Physician recommends and patient chooses bed at      Patient to be transferred to [name] on 01/15/17.  Patient to be transferred to facility by [facility]     Patient family notified on 01/15/17 of transfer.  Name of family member notified:  family at bedside     PHYSICIAN Please prepare prescriptions, Please sign DNR     Additional Comment:     _______________________________________________ Tresa Moore, LCSW 01/15/2017, 12:06 PM

## 2017-01-15 NOTE — Progress Notes (Signed)
Patient had 3 incontient stool this shift thus far. Medium light brown in color #7 on bristol chart. Peri care given and patient positioned for comfort.

## 2017-01-15 NOTE — Discharge Summary (Addendum)
Physician Discharge Summary  Danny Oneill ZOX:[id number] DOB: 07/02/1931 DOA: 12/03/2016  PCP: Geoffry Paradise, MD  Admit date: 12/03/2016 Discharge date: 01/15/2017  Time spent: 1 hour   Recommendations for Outpatient Follow-up:  1. Patient will need vancomycin taper as per instructions--would get CBC plus differential in 1 week and complete metabolic panel at that time as well 2. Patient has been discontinued off of lisinopril this admission--Brillinta was restarted on d/c given recent MI last yr 3. Will need further follow-up with outpatient physicians--will also need discussions regarding goals of care if patient fails to thrive ongoing--- extensive discussions have been undertaken during hospital stay and family elected for aggressive measures and care 4. Will need outpatient follow-up coordinated by facility with Dr. Sherron Monday of urology 5. Needs follow up with IR--forwarded to Dr. Fredia Sorrow to coordinate care and might need eventual cholecytectomy if able to do well long-term--will need drain clinic follow-up  Discharge Diagnoses:  Principal Problem:   Closed left hip fracture (HCC) Active Problems:   Hyperlipidemia   CAD (coronary artery disease)   CKD (chronic kidney disease), stage III (HCC)   Hip fracture (HCC)   Compromised airway   Hypovolemic shock (HCC)   Elevated troponin I level   Chronic combined systolic and diastolic heart failure (HCC)   Acute respiratory failure (HCC)   Elevated LFTs   Pressure injury of skin   Discharge Condition: stable but guarded  Diet recommendation: feeds  Filed Weights   01/08/17 0427 01/09/17 2229 01/11/17 0602  Weight: 99.9 kg (220 lb 3.8 oz) 101.1 kg (222 lb 12.8 oz) 99.6 kg (219 lb 9.6 oz)    History of present illness:  83 year old male admitted on 12/03/2016 with a hip fracture to the hospitalist service He declined postop and was intubated on 9/4--found to have cholecystitis and cholecystotomy placed 9/11-e had an IR  capped percutaneous drain Because of his overwhelming issues, palliative care consulted on 9/25 and it was elected to continue aggressive measures Cardiology consulted regarding edema---mental status very slowly improved over the course of 9/30 to day of discharge Has intermittently mild delirium which is made worse by his non-functional hearing aides  Had complications of oral candidiasis as well as C. Difficile colitis and Escherichia coli pyelonephritis this admission  Hospital Course:   Hip fracture  Repaired on 9/4 and will need skilled therapy on discharge  He has been OOB as much as tolerated this admit  Metabolic encephalopathy multifactorial  Stress dose steroids, septic shock-from UTI, acalculous cholecystitis and also C. Difficile colitis  All of these were treated and he was weaned off of stress dose steroids  His current duration of vancomycin is stepwise weaning for complicated C. difficile   Vancomycin125 q 12 till 10/19   Then 125 qd till 10/26   Then 125 qod till 11/3   Then 125 q 72 hr till 11/11  Stool episodes and amounts decreasing--have encouraged him to eat more than drink to provide solidity to stools  history of paroxysmal atrial fibrillation CAD   Felt to be related to sepsis physiology-for now aspirin despite high CHAD2VASC2   Resumed on Brillinta--he is not a candidate for ongoing rate control as blood pressures are low and this is only paroxysmal   Lisinopril held given low BP  Dysphagia  Speech therapy saw the patient and initially patient was on core track  Eventually patient was placed on dysphagia 3 nectar thickened diet  Acute Acalculous Cholecystitis  Abdominal US noted gallbladder sludge/diffuse thickening - +Murphy sign  on exam - 12/15/16  S/P percutaneous cholecystostomy drain >capped 12/25/16 w/ IR input--forwarded to Dr. Fredia Sorrow re: OP follow up  Hypernatremia, Hypervolemic Peripheral edema  Placed on Lasix on discharge will need cautious  use of the same as an outpatient--It has been explained on multiple  x's to family by cardiology and this MD that resolution anasarca will take time  Avoid use of Neurontin or Lyrica secondary to risk of altered mental status  Benign prostatic hypertrophy/urinary retention  We will need to continue Foley catheter on discharge from [facility]osp[facility]   Needs OP Urology follow up  Multifactorial adult failure to thrive  Long discussions have been held with palliative care in addition to family members  Patient will need outpatient discussions ongoing and if fails to thrive would discuss with family and hospice and palliative care at the facility        Discharge Exam: Vitals:   01/14/17 2245 01/15/17 0427  BP: 95/65 (!) 96/58  Pulse: 85 80  Resp: 16 16  Temp: 98.4 F (36.9 C) 98.3 F (36.8 C)  SpO2: 97% 97%    Awake--hearing better today Eating some No cp I soft pudding like stool in bed Respiratory: clinically clear no added sound no rales no rhonchi He has a Foley catheter in Some Moisture assosc dermatitis in groin as well as on sacrum--no breakdown   Discharge Instructions   Discharge Instructions    Diet - low sodium heart healthy    Complete by:  As directed    Do Not Remove Foley    Complete by:  As directed    Increase activity slowly    Complete by:  As directed      Current Discharge Medication List    START taking these medications   Details  feeding supplement, ENSURE ENLIVE, (ENSURE ENLIVE) LIQD Take 237 mLs by mouth 3 (three) times daily with meals. Qty: 237 mL, Refills: 12    Hydrocortisone (GERHARDT'S BUTT CREAM) CREA Apply 1 application topically 2 (two) times daily.    magic mouthwash SOLN Take 5 mLs by mouth 3 (three) times daily. Refills: 0    Maltodextrin-Xanthan Gum (RESOURCE THICKENUP CLEAR) POWD Take 125 g by mouth as needed.    midodrine (PROAMATINE) 10 MG tablet Take 1 tablet (10 mg total) by mouth 3 (three) times daily with meals.     potassium chloride SA (K-DUR,KLOR-CON) 20 MEQ tablet Take 2 tablets (40 mEq total) by mouth daily.    !! vancomycin (VANCOCIN) 50 mg/mL oral solution Take 2.5 mLs (125 mg total) by mouth 4 (four) times daily. Qty: 20 mL, Refills: 0    !! vancomycin (VANCOCIN) 50 mg/mL oral solution Take 2.5 mLs (125 mg total) by mouth every 12 (twelve) hours.    !! vancomycin (VANCOCIN) 50 mg/mL oral solution Take 2.5 mLs (125 mg total) by mouth daily. Qty: 17.5 mL, Refills: 0    !! vancomycin (VANCOCIN) 50 mg/mL oral solution Take 2.5 mLs (125 mg total) by mouth every other day. Qty: 7.5 mL, Refills: 0    !! vancomycin (VANCOCIN) 50 mg/mL oral solution Take 2.5 mLs (125 mg total) by mouth every 3 (three) days. Qty: 5 mL, Refills: 0     !! - Potential duplicate medications found. Please discuss with provider.    CONTINUE these medications which have NOT CHANGED   Details  aspirin EC 81 MG tablet Take 81 mg by mouth daily with breakfast.     atorvastatin (LIPITOR) 20 MG tablet Take 1 tablet (  20 mg total) by mouth daily. Qty: 30 tablet, Refills: 11    carvedilol (COREG) 3.125 MG tablet Take 3.125 mg by mouth 2 (two) times daily. Refills: 3    furosemide (LASIX) 20 MG tablet Take 1 tablet by mouth daily and ok to use extra tablet as needed for swelling. Qty: 180 tablet, Refills: 3   Associated Diagnoses: PAF (paroxysmal atrial fibrillation) (HCC); Coronary artery disease involving native coronary artery of native heart without angina pectoris    tamsulosin (FLOMAX) 0.4 MG CAPS capsule TAKE 1 CAPSULE(0.4 MG) BY MOUTH DAILY AFTER SUPPER Qty: 90 capsule, Refills: 3    ticagrelor (BRILINTA) 90 MG TABS tablet Take 1 tablet (90 mg total) by mouth 2 (two) times daily. Qty: 60 tablet, Refills: 11      STOP taking these medications     lisinopril (PRINIVIL,ZESTRIL) 2.5 MG tablet        Allergies  Allergen Reactions  . Amiodarone Rash  . Morphine And Related Other (See Comments)    Confusion/  hallucinations    Follow-up Information    Yolonda Kida, MD. Schedule an appointment as soon as possible for a visit in 2 week(s).   Specialty:  Orthopedic Surgery Why:  For wound re-check, For suture removal Contact information: [address] [place] [address] [phone]        Gilmer Mor, DO Follow up in 3 week(s).   Specialty:  Interventional Radiology Why:  our office will call you with appointment date and time Contact information: [address] [place] [address] [phone]            The results of significant diagnostics from this hospitalization (including imaging, microbiology, ancillary and laboratory) are listed below for reference.    Significant Diagnostic Studies: Ct Head Wo Contrast  Result Date: 12/29/2016 CLINICAL DATA:  LEFT level consciousness. Recent hip surgery in urinary tract infection. EXAM: CT HEAD WITHOUT CONTRAST TECHNIQUE: Contiguous axial images were obtained from the base of the skull through the vertex without intravenous contrast. COMPARISON:  CT HEAD October 14, 2016 FINDINGS: BRAIN: No intraparenchymal hemorrhage, mass effect nor midline shift. The ventricles and sulci are normal for age. Patchy supratentorial white matter hypodensities less than expected for patient's age, though non-specific are most compatible with chronic small vessel ischemic disease. No acute large vascular territory infarcts. No abnormal extra-axial fluid collections. Basal cisterns are patent. VASCULAR: Severe calcific atherosclerosis of the carotid siphons. SKULL: No skull fracture. No significant scalp soft tissue swelling. SINUSES/ORBITS: Lobulated mucosal thickening paranasal sinuses with chronic chronic bony remodeling maxillary and sphenoid sinuses compatible with chronic sinusitis. Reviewed The included ocular globes and orbital contents are non-suspicious. Status post bilateral ocular lens implants. Nasogastric tube via  LEFT nares. OTHER: Patient is edentulous. IMPRESSION: 1. No acute intracranial process. 2. Severe atherosclerosis carotid siphons, otherwise negative noncontrast CT HEAD for age. Electronically Signed   By: Awilda Metro M.D.   On: 12/29/2016 19:26   Dg Chest Port 1 View  Result Date: 12/30/2016 CLINICAL DATA:  Pneumonia with weakness EXAM: PORTABLE CHEST 1 VIEW COMPARISON:  12/21/2016 FINDINGS: There is extensive calcified pleural plaques bilaterally on April 2018 chest CT there is also subpleural lines and round atelectasis. Lung volumes are low. Stable borderline heart size with aortic tortuosity. A feeding tube at least reaches the stomach. No Kerley lines, air bronchograms, or pneumothorax. 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 No acute finding when compared to  prior. Superimposed pneumonia could easily be obscured. Electronically Signed   By: Marnee Spring M.D.   On: 12/30/2016 07:39   Dg Chest Port 1 View  Result Date: 12/21/2016 CLINICAL DATA:  Acute respiratory failure EXAM: PORTABLE CHEST 1 VIEW COMPARISON:  12/15/2016 FINDINGS: Calcified pleural plaques bilaterally. Right central line and feeding tube are unchanged. Bilateral parenchymal scarring changes are stable. Heart is upper limits normal in size. No definite visible significant effusions. IMPRESSION: Stable pleuroparenchymal changes most compatible with asbestosis. No change. Electronically Signed   By: Charlett Nose M.D.   On: 12/21/2016 07:17   Dg Abd Portable 2v  Result Date: 12/17/2016 CLINICAL DATA:  83 year old male with abdominal pain. Evaluate for pneumoperitoneum. EXAM: PORTABLE ABDOMEN - 2 VIEW COMPARISON:  None. FINDINGS: Feeding tube tip is in the second portion of the duodenum. Percutaneous cholecystostomy drain noted projecting over the right upper quadrant of the abdomen. Gas and stool are noted in the colon and distal rectum. No pathologic dilatation of small bowel.  Decubitus view demonstrates no pneumoperitoneum. Calcified pleural plaques are incidentally noted. IMPRESSION: 1. No pneumoperitoneum. 2. Nonobstructive bowel gas pattern. 3. Support apparatus, as above. Electronically Signed   By: Trudie Reed M.D.   On: 12/17/2016 11:13   Dg Hip Port Unilat With Pelvis 1v Left  Result Date: 12/29/2016 CLINICAL DATA:  Left hip fracture EXAM: DG HIP (WITH OR WITHOUT PELVIS) 1V PORT LEFT COMPARISON:  Fluoroscopy 12/08/2016 FINDINGS: Status post ORIF of an intertrochanteric left femur fracture. A lesser trochanteric fracture fragment appears more distracted. Opaque structure over the pelvis is likely a rectal tube. Distorted pelvic ring due to rotation. No evidence of fracture. IMPRESSION: Recent left intertrochanteric femur fracture and ORIF. Greater distraction of the lesser trochanteric fragment compared to fluoroscopy 12/08/2016. The hardware is in stable position. Electronically Signed   By: Marnee Spring M.D.   On: 12/29/2016 07:45   US Abdomen Limited Ruq  Result Date: 12/22/2016 CLINICAL DATA:  83 year old male with elevated transaminases. Percutaneous cholecystotomy. Subsequent encounter. EXAM: ULTRASOUND ABDOMEN LIMITED RIGHT UPPER QUADRANT COMPARISON:  12/17/2016 plain film examination. 12/15/2016 cholecystogram. 12/12/2016 ultrasound. FINDINGS: Gallbladder: Cholecystostomy tube in place. Gallbladder sludge. Gallbladder wall thickening measuring up to 5.1 mm. Ultrasonographer was not able to determine if the patient was tender over this region secondary to bandage. Common bile duct: Diameter: 5.4 mm. Liver: No hepatic lesion or intrahepatic biliary duct dilation. Portal vein is patent on color Doppler imaging with normal direction of blood flow towards the liver. Ascites.  Right-sided pleural effusion. IMPRESSION: Cholecystostomy tube in place. Gallbladder sludge. Gallbladder wall thickening measuring up to 5.1 mm. Ultrasonographer was not able to determine if  the patient was tender over this region secondary to bandage. No hepatic lesion or intrahepatic biliary duct dilation. Ascites. Right-sided pleural effusion. Electronically Signed   By: Lacy Duverney M.D.   On: 12/22/2016 12:34    Microbiology: No results found for this or any previous visit (from the past 240 hour(s)).   Labs: Basic Metabolic Panel:  Recent Labs Lab 01/09/17 0442 01/10/17 0504 01/12/17 0528 01/14/17 0535  NA 139 136 138 136  K 4.4 4.1 3.3* 4.4  CL 102 101 100* 99*  CO2 GLUCOSE 118* 108* 88 87  BUN 20 22* 24* 23*  CREATININE 0.87 0.77 0.82 0.88  CALCIUM 7.7* 7.7* 7.6* 7.8*  MG 1.6* 1.9 1.6*  --    Liver Function Tests:  Recent Labs Lab 01/09/17 0442 01/10/17 0504 01/12/17 0528 01/14/17  0535  AST 47* 37 33 33  ALT 58 48 37 36  ALKPHOS 133* 132* 109 168*  BILITOT 3.1* 3.2* 2.7* 2.6*  PROT 4.7* 4.9* 4.3* 4.8*  ALBUMIN 1.9* 1.9* 1.7* 1.9*   No results for input(s): LIPASE, AMYLASE in the last 168 hours. No results for input(s): AMMONIA in the last 168 hours. CBC:  Recent Labs Lab 01/09/17 0442 01/10/17 0504 01/12/17 0528 01/14/17 0535  WBC 7.0 9.0 7.1 10.5  HGB 10.2* 10.4* 9.9* 10.2*  HCT 32.9* 33.3* 32.0* 33.1*  MCV 103.8* 102.1* 102.6* 102.5*  PLT 82* 82* 101* 140*   Cardiac Enzymes: No results for input(s): CKTOTAL, CKMB, CKMBINDEX, TROPONINI in the last 168 hours. BNP: BNP (last 3 results)  Recent Labs  02/24/16 1610 10/14/16 1600 01/01/17 1940  BNP 745.7* 474.8* 332.1*    ProBNP (last 3 results) No results for input(s): PROBNP in the last 8760 hours.  CBG:  Recent Labs Lab 01/14/17 0633 01/14/17 1144 01/14/17 1722 01/14/17 2159 01/15/17 0557  GLUCAP 84 147* 133* 116* 132*       Signed:  Rhetta Mura MD   [facility] 01/15/2017, 9:31 AM

## 2017-01-18 ENCOUNTER — Other Ambulatory Visit: Payer: Self-pay | Admitting: General Surgery

## 2017-01-18 DIAGNOSIS — R7989 Other specified abnormal findings of blood chemistry: Secondary | ICD-10-CM

## 2017-01-18 DIAGNOSIS — Z9889 Other specified postprocedural states: Secondary | ICD-10-CM | POA: Diagnosis not present

## 2017-01-18 DIAGNOSIS — G9340 Encephalopathy, unspecified: Secondary | ICD-10-CM | POA: Diagnosis not present

## 2017-01-18 DIAGNOSIS — K829 Disease of gallbladder, unspecified: Secondary | ICD-10-CM | POA: Diagnosis not present

## 2017-01-18 DIAGNOSIS — Z8781 Personal history of (healed) traumatic fracture: Secondary | ICD-10-CM | POA: Diagnosis not present

## 2017-01-18 DIAGNOSIS — R945 Abnormal results of liver function studies: Principal | ICD-10-CM

## 2017-01-21 DIAGNOSIS — M25552 Pain in left hip: Secondary | ICD-10-CM | POA: Diagnosis not present

## 2017-01-21 DIAGNOSIS — M6281 Muscle weakness (generalized): Secondary | ICD-10-CM | POA: Diagnosis not present

## 2017-01-21 DIAGNOSIS — F0280 Dementia in other diseases classified elsewhere without behavioral disturbance: Secondary | ICD-10-CM | POA: Diagnosis not present

## 2017-01-21 DIAGNOSIS — M24562 Contracture, left knee: Secondary | ICD-10-CM | POA: Diagnosis not present

## 2017-01-21 DIAGNOSIS — Z5189 Encounter for other specified aftercare: Secondary | ICD-10-CM | POA: Diagnosis not present

## 2017-01-21 DIAGNOSIS — R5381 Other malaise: Secondary | ICD-10-CM | POA: Diagnosis not present

## 2017-01-21 DIAGNOSIS — R1310 Dysphagia, unspecified: Secondary | ICD-10-CM | POA: Diagnosis not present

## 2017-01-21 DIAGNOSIS — M25652 Stiffness of left hip, not elsewhere classified: Secondary | ICD-10-CM | POA: Diagnosis not present

## 2017-01-21 DIAGNOSIS — D649 Anemia, unspecified: Secondary | ICD-10-CM | POA: Diagnosis not present

## 2017-01-21 DIAGNOSIS — R600 Localized edema: Secondary | ICD-10-CM | POA: Diagnosis not present

## 2017-01-22 ENCOUNTER — Encounter (HOSPITAL_COMMUNITY): Payer: Self-pay

## 2017-01-22 ENCOUNTER — Inpatient Hospital Stay (HOSPITAL_COMMUNITY)
Admission: EM | Admit: 2017-01-22 | Discharge: 2017-02-04 | DRG: 951 | Disposition: E | Payer: PPO | Attending: Internal Medicine | Admitting: Internal Medicine

## 2017-01-22 DIAGNOSIS — M6281 Muscle weakness (generalized): Secondary | ICD-10-CM | POA: Diagnosis not present

## 2017-01-22 DIAGNOSIS — I480 Paroxysmal atrial fibrillation: Secondary | ICD-10-CM | POA: Diagnosis present

## 2017-01-22 DIAGNOSIS — D649 Anemia, unspecified: Secondary | ICD-10-CM | POA: Diagnosis not present

## 2017-01-22 DIAGNOSIS — I440 Atrioventricular block, first degree: Secondary | ICD-10-CM | POA: Diagnosis present

## 2017-01-22 DIAGNOSIS — Z515 Encounter for palliative care: Secondary | ICD-10-CM | POA: Diagnosis present

## 2017-01-22 DIAGNOSIS — I2510 Atherosclerotic heart disease of native coronary artery without angina pectoris: Secondary | ICD-10-CM | POA: Diagnosis present

## 2017-01-22 DIAGNOSIS — K5791 Diverticulosis of intestine, part unspecified, without perforation or abscess with bleeding: Secondary | ICD-10-CM | POA: Diagnosis present

## 2017-01-22 DIAGNOSIS — K922 Gastrointestinal hemorrhage, unspecified: Secondary | ICD-10-CM | POA: Diagnosis not present

## 2017-01-22 DIAGNOSIS — F0280 Dementia in other diseases classified elsewhere without behavioral disturbance: Secondary | ICD-10-CM | POA: Diagnosis not present

## 2017-01-22 DIAGNOSIS — Z66 Do not resuscitate: Secondary | ICD-10-CM | POA: Diagnosis present

## 2017-01-22 DIAGNOSIS — Z7982 Long term (current) use of aspirin: Secondary | ICD-10-CM | POA: Diagnosis not present

## 2017-01-22 DIAGNOSIS — I351 Nonrheumatic aortic (valve) insufficiency: Secondary | ICD-10-CM | POA: Diagnosis present

## 2017-01-22 DIAGNOSIS — I255 Ischemic cardiomyopathy: Secondary | ICD-10-CM | POA: Diagnosis present

## 2017-01-22 DIAGNOSIS — E785 Hyperlipidemia, unspecified: Secondary | ICD-10-CM | POA: Diagnosis present

## 2017-01-22 DIAGNOSIS — R578 Other shock: Secondary | ICD-10-CM | POA: Diagnosis present

## 2017-01-22 DIAGNOSIS — F0390 Unspecified dementia without behavioral disturbance: Secondary | ICD-10-CM | POA: Diagnosis present

## 2017-01-22 DIAGNOSIS — Z9189 Other specified personal risk factors, not elsewhere classified: Secondary | ICD-10-CM | POA: Diagnosis not present

## 2017-01-22 DIAGNOSIS — M25552 Pain in left hip: Secondary | ICD-10-CM | POA: Diagnosis not present

## 2017-01-22 DIAGNOSIS — R1310 Dysphagia, unspecified: Secondary | ICD-10-CM | POA: Diagnosis not present

## 2017-01-22 DIAGNOSIS — Z8781 Personal history of (healed) traumatic fracture: Secondary | ICD-10-CM | POA: Diagnosis not present

## 2017-01-22 DIAGNOSIS — I5042 Chronic combined systolic (congestive) and diastolic (congestive) heart failure: Secondary | ICD-10-CM | POA: Diagnosis present

## 2017-01-22 DIAGNOSIS — D62 Acute posthemorrhagic anemia: Secondary | ICD-10-CM | POA: Diagnosis present

## 2017-01-22 DIAGNOSIS — R627 Adult failure to thrive: Secondary | ICD-10-CM | POA: Diagnosis present

## 2017-01-22 DIAGNOSIS — I959 Hypotension, unspecified: Secondary | ICD-10-CM | POA: Diagnosis present

## 2017-01-22 DIAGNOSIS — G9340 Encephalopathy, unspecified: Secondary | ICD-10-CM | POA: Diagnosis present

## 2017-01-22 DIAGNOSIS — Z96651 Presence of right artificial knee joint: Secondary | ICD-10-CM | POA: Diagnosis present

## 2017-01-22 DIAGNOSIS — R031 Nonspecific low blood-pressure reading: Secondary | ICD-10-CM | POA: Diagnosis not present

## 2017-01-22 DIAGNOSIS — R600 Localized edema: Secondary | ICD-10-CM | POA: Diagnosis not present

## 2017-01-22 DIAGNOSIS — Z7902 Long term (current) use of antithrombotics/antiplatelets: Secondary | ICD-10-CM

## 2017-01-22 DIAGNOSIS — R5381 Other malaise: Secondary | ICD-10-CM | POA: Diagnosis not present

## 2017-01-22 DIAGNOSIS — Z5189 Encounter for other specified aftercare: Secondary | ICD-10-CM | POA: Diagnosis not present

## 2017-01-22 DIAGNOSIS — N183 Chronic kidney disease, stage 3 (moderate): Secondary | ICD-10-CM | POA: Diagnosis present

## 2017-01-22 DIAGNOSIS — I252 Old myocardial infarction: Secondary | ICD-10-CM | POA: Diagnosis not present

## 2017-01-22 DIAGNOSIS — M24562 Contracture, left knee: Secondary | ICD-10-CM | POA: Diagnosis not present

## 2017-01-22 DIAGNOSIS — M25652 Stiffness of left hip, not elsewhere classified: Secondary | ICD-10-CM | POA: Diagnosis not present

## 2017-01-22 NOTE — ED Triage Notes (Signed)
Pt arrived via [facility] from [facility].  +C-diff and receiving treatment.  New GI blood noticed.  Hypotensive.  Pt takes blood thinner and ASA.

## 2017-01-22 NOTE — ED Provider Notes (Signed)
[facility] EMERGENCY [facility]MENT Provider Note   CSN: [id number] Arrival date & time: 28-Jun-2016  2331     History   Chief Complaint No chief complaint on file.   HPI Danny Oneill is a 83 y.o. male.   The history is provided by the patient and the EMS personnel.  He was transferred from a skilled nursing facility because of rectal bleeding. He had been discharged one week ago following left hip fracture, postoperative course complicated by cholecystitis, E. coli pyelonephritis, and Clostridium difficile colitis.  He was noted to have dark red blood per rectum today.  Patient has no complaints.  He denies abdominal pain, nausea, vomiting.  He was not aware that he had passed blood, but was told that he had passed blood.  EMS reports low blood pressure in route.  Past Medical History:  Diagnosis Date  . Anemia   . Aortic insufficiency 03/09/2016   a. mild-mod AI by echo 03/04/16.  . Arthritis   . Asbestosis (HCC)    "no breathing problems" pt states worked in [employer] for many yrs and was exposed to asbestosis  . Bladder outlet obstruction 03/09/2016  . CAD (coronary artery disease)    a. 02/2016: inf lat STEMI with occluded OM2 which was treated with DES; otherwise he had moderate 50% oRCA, 40% oLM, 70% D1, 10% mLAD, 80% lateral 2nd marg which was treated medically.  . Cardiomyopathy, ischemic   . Chronic combined systolic and diastolic CHF (congestive heart failure) (HCC)    a. EF 30-35% at time of STEMI 02/2016, improved to 40-45% by echo 11/20 (but with pericardial effusion). b. Further improved EF to 55-60% 03/04/16.  Marland Kitchen. CKD (chronic kidney disease), stage III (HCC)   . Coronary artery disease   . Dyspnea   . First degree AV block   . Hip fracture (HCC) 11/2016  . Hyperlipidemia   . Hyponatremia   . Lumbar spondylosis   . Myocardial infarction (HCC)   . Orthostatic hypotension   . PAF (paroxysmal atrial fibrillation) (HCC)    a. dx 02/2016: not  anticoagulated due to pericardial effusion; amiodarone stopped due to suspected allergic drug rash.  . Pericardial effusion    a. 02/2016 - followed clinically (mod-large at first then small-mod in f/u echo).    Patient Active Problem List   Diagnosis Date Noted  . Pressure injury of skin 01/04/2017  . Elevated LFTs   . Acute respiratory failure (HCC)   . Elevated troponin I level 12/11/2016  . Chronic combined systolic and diastolic heart failure (HCC) 12/11/2016  . Compromised airway   . Hypovolemic shock (HCC)   . Closed left hip fracture (HCC) 12/03/2016  . Hip fracture (HCC) 12/03/2016  . Allergic drug rash 03/09/2016  . Orthostatic hypotension 03/09/2016  . Hyponatremia 03/09/2016  . Hypokalemia 03/09/2016  . Bladder outlet obstruction 03/09/2016  . Urinary retention 03/09/2016  . First degree AV block 03/09/2016  . Aortic insufficiency 03/09/2016  . CKD (chronic kidney disease), stage III (HCC) 03/09/2016  . Anemia   . Pericardial effusion 02/27/2016  . PAF (paroxysmal atrial fibrillation) (HCC) 02/27/2016  . CAD (coronary artery disease) 02/27/2016  . Cardiomyopathy, ischemic 02/27/2016  . Acute on chronic combined systolic and diastolic CHF (congestive heart failure) (HCC) 02/24/2016  . Systolic and diastolic CHF, acute (HCC) 02/13/2016  . Anterior and lateral ST segment elevation (HCC) 02/13/2016  . ARF (acute renal failure) (HCC) 02/13/2016  . Acute urinary retention 02/13/2016  . Hematuria 02/13/2016  .  Elevated liver enzymes 02/13/2016  . Bilateral lower extremity edema 02/13/2016  . Low BP 02/13/2016  . Hyperlipidemia 02/13/2016  . Acute ST elevation myocardial infarction (STEMI) (HCC)   . Acute inferolateral myocardial infarction (HCC) 02/02/2016  . Acute MI, inferolateral wall, initial episode of care (HCC)   . Arterial hypotension   . Overweight (BMI 25.0-29.9) 10/04/2013  . Expected blood loss anemia 10/04/2013  . S/P right TKA 10/03/2013    Past  Surgical History:  Procedure Laterality Date  . CARDIAC CATHETERIZATION N/A 02/02/2016   Procedure: Left Heart Cath and Coronary Angiography;  Surgeon: Corky Crafts, MD;  Location: [facility] INVASIVE CV LAB;  Service: Cardiovascular;  Laterality: N/A;  . CARDIAC CATHETERIZATION N/A 02/02/2016   Procedure: Coronary Stent Intervention;  Surgeon: Corky Crafts, MD;  Location: [facility] INVASIVE CV LAB;  Service: Cardiovascular;  Laterality: N/A;  . CARDIAC CATHETERIZATION N/A 02/02/2016   Procedure: IABP Insertion;  Surgeon: Corky Crafts, MD;  Location: [facility] INVASIVE CV LAB;  Service: Cardiovascular;  Laterality: N/A;  . CATARACTS REMOVED     BIL  . CORONARY STENT PLACEMENT  01/2016  . INTRAMEDULLARY (IM) NAIL INTERTROCHANTERIC Left 12/08/2016   Procedure: INTRAMEDULLARY (IM) NAIL INTERTROCHANTRIC;  Surgeon: Yolonda Kida, MD;  Location: [facility] OR;  Service: Orthopedics;  Laterality: Left;  . IR PERC CHOLECYSTOSTOMY  12/15/2016  . ROTATOR CUFF REPAIR  2011   L SHOULDER  . TOTAL KNEE ARTHROPLASTY Right 10/03/2013   Procedure: RIGHT TOTAL KNEE ARTHROPLASTY;  Surgeon: [name], MD;  Location: [facility] ORS;  Service: Orthopedics;  Laterality: Right;       Home Medications    Prior to Admission medications   Medication Sig Start Date End Date Taking? Authorizing Provider  aspirin EC 81 MG tablet Take 81 mg by mouth daily with breakfast.     [provider]  atorvastatin (LIPITOR) 20 MG tablet Take 1 tablet (20 mg total) by mouth daily. 04/07/16   Lennette Bihari, MD  carvedilol (COREG) 3.125 MG tablet Take 3.125 mg by mouth 2 (two) times daily. 11/18/16   [provider]  feeding supplement, ENSURE ENLIVE, (ENSURE ENLIVE) LIQD Take 237 mLs by mouth 3 (three) times daily with meals. 01/12/17   Rhetta Mura, MD  furosemide (LASIX) 20 MG tablet Take 1 tablet by mouth daily and ok to use extra tablet as needed for swelling. 10/14/16   Cardama, Amadeo Garnet, MD    Hydrocortisone (GERHARDT'S BUTT CREAM) CREA Apply 1 application topically 2 (two) times daily. 01/15/17   Rhetta Mura, MD  magic mouthwash SOLN Take 5 mLs by mouth 3 (three) times daily. 01/12/17   Rhetta Mura, MD  Maltodextrin-Xanthan Gum (RESOURCE THICKENUP CLEAR) POWD Take 125 g by mouth as needed. 01/12/17   Rhetta Mura, MD  midodrine (PROAMATINE) 10 MG tablet Take 1 tablet (10 mg total) by mouth 3 (three) times daily with meals. 01/12/17   Rhetta Mura, MD  potassium chloride SA (K-DUR,KLOR-CON) 20 MEQ tablet Take 2 tablets (40 mEq total) by mouth daily. 01/12/17   Rhetta Mura, MD  tamsulosin (FLOMAX) 0.4 MG CAPS capsule TAKE 1 CAPSULE(0.4 MG) BY MOUTH DAILY AFTER SUPPER 04/01/16   Dunn, Tacey Ruiz, PA-C  ticagrelor (BRILINTA) 90 MG TABS tablet Take 1 tablet (90 mg total) by mouth 2 (two) times daily. 04/07/16   Lennette Bihari, MD  vancomycin (VANCOCIN) 50 mg/mL oral solution Take 2.5 mLs (125 mg total) by mouth every 12 (twelve) hours. 01/15/17 01-24-2017  Rhetta Mura, MD  vancomycin (VANCOCIN) 50 mg/mL oral solution Take 2.5 mLs (125 mg total) by mouth daily. 29-Jan-2017 01/29/17  Rhetta Mura, MD  vancomycin (VANCOCIN) 50 mg/mL oral solution Take 2.5 mLs (125 mg total) by mouth every other day. 01/30/17 02/06/17  Rhetta Mura, MD  vancomycin (VANCOCIN) 50 mg/mL oral solution Take 2.5 mLs (125 mg total) by mouth every 3 (three) days. 02/07/17 02/14/17  Rhetta Mura, MD    Family History Family History  Problem Relation Age of Onset  . Cancer Mother     Social History Social History  Substance Use Topics  . Smoking status: Never Smoker  . Smokeless tobacco: Never Used  . Alcohol use No     Allergies   Amiodarone and Morphine and related   Review of Systems Review of Systems  All other systems reviewed and are negative.    Physical Exam Updated Vital Signs BP (!) 233/214 (BP Location: Right Arm) Comment:  unable to get manual BP  Ht 6\' 1"  (1.854 m)   Wt 99.8 kg (220 lb)   SpO2 97%   BMI 29.03 kg/m    Physical Exam  Nursing note and vitals reviewed.  83 year old male, resting comfortably and in no acute distress. Vital signs are significant for hypertension. Oxygen saturation is 97%, which is normal. Head is normocephalic and atraumatic. PERRLA, EOMI. Oropharynx is clear. Neck is nontender and supple without adenopathy or JVD. Back is nontender and there is no CVA tenderness. Lungs are clear without rales, wheezes, or rhonchi. Chest is nontender. Heart has regular rate and rhythm without murmur. Abdomen is soft, flat, nontender without masses or hepatosplenomegaly and peristalsis is hypoactive. Genitalia: Indwelling Foley catheter in place. Extremities have 1-2+r edema, full range of motion is present. Skin is warm and dry without rash. Neurologic: Mental status is normal, cranial nerves are intact, there are no motor or sensory deficits.  ED Treatments / Results  Labs (all labs ordered are listed, but only abnormal results are displayed) Labs Reviewed  BASIC METABOLIC PANEL - Abnormal; Notable for the following:       Result Value   Glucose, Bld 128 (*)    BUN 37 (*)    Calcium 6.9 (*)    All other components within normal limits  CBC WITH DIFFERENTIAL/PLATELET - Abnormal; Notable for the following:    RBC 1.79 (*)    Hemoglobin 5.7 (*)    HCT 17.9 (*)    RDW 20.0 (*)    Lymphs Abs 4.1 (*)    All other components within normal limits  I-STAT CG4 LACTIC ACID, ED - Abnormal; Notable for the following:    Lactic Acid, Venous 2.91 (*)    All other components within normal limits  POC OCCULT BLOOD, ED  TYPE AND SCREEN  PREPARE RBC (CROSSMATCH)    Procedures Procedures (including critical care time) CRITICAL CARE Performed by: ZOXWR,UEAVW Total critical care time: 120 minutes Critical care time was exclusive of separately billable procedures and treating other  patients. Critical care was necessary to treat or prevent imminent or life-threatening deterioration. Critical care was time spent personally by me on the following activities: development of treatment plan with patient and/or surrogate as well as nursing, discussions with consultants, evaluation of patient's response to treatment, examination of patient, obtaining history from patient or surrogate, ordering and performing treatments and interventions, ordering and review of laboratory studies, ordering and review of radiographic studies, pulse oximetry and re-evaluation of patient's condition.  Medications Ordered in ED Medications -  No data to display   Initial Impression / Assessment and Plan / ED Course  I have reviewed the triage vital signs and the nursing notes.  Pertinent lab results that were available during my care of the patient were reviewed by me and considered in my medical decision making (see chart for details).  Lower GI bleeding.  EMS has brought with him a specimen cup with patient's stool which is dark red and with clots.  On review of his medications, is on ticagrelor, but no systemic anticoagulants.  Review of old records confirms complicated hospital stay with discharge 1 week ago, but I see no prior GI bleeds.  Statistically, this is most likely to be diverticular bleeding.  Baseline labs are ordered as well as type and screen.  Hemoglobin is come back at 5.7.  Blood pressure has dropped. Blood is ordered for transfusion.  However, family is uncertain that they wish to proceed with blood transfusion.  Risks and benefits of transfusion and no transfusion were discussed.  Eventually, they have decided to proceed with blood transfusion.  Also, lactate is come back mildly elevated.  This is secondary to bleeding and hypotension and not sepsis. Case is discussed with Dr. Julian Reil of [facility] who agrees to admit the patient.  Family was very reluctant to accept blood  transfusion.  After discussion with them, they decided to proceed.  Blood pressure remained in the 70s to low 80s in spite of fluids and blood.  After 1 unit had infused, family stated that they did not wish to have any more blood products administered and wanted to transition to comfort care.  Final Clinical Impressions(s) / ED Diagnoses   Final diagnoses:  Gastrointestinal hemorrhage, unspecified gastrointestinal hemorrhage type    New Prescriptions New Prescriptions   No medications on file    Dione Booze, MD 01/23/17 [phone]

## 2017-01-23 DIAGNOSIS — I440 Atrioventricular block, first degree: Secondary | ICD-10-CM | POA: Diagnosis present

## 2017-01-23 DIAGNOSIS — I5042 Chronic combined systolic (congestive) and diastolic (congestive) heart failure: Secondary | ICD-10-CM | POA: Diagnosis present

## 2017-01-23 DIAGNOSIS — G9340 Encephalopathy, unspecified: Secondary | ICD-10-CM | POA: Diagnosis present

## 2017-01-23 DIAGNOSIS — R578 Other shock: Secondary | ICD-10-CM | POA: Diagnosis present

## 2017-01-23 DIAGNOSIS — E785 Hyperlipidemia, unspecified: Secondary | ICD-10-CM | POA: Diagnosis present

## 2017-01-23 DIAGNOSIS — I252 Old myocardial infarction: Secondary | ICD-10-CM | POA: Diagnosis not present

## 2017-01-23 DIAGNOSIS — Z515 Encounter for palliative care: Principal | ICD-10-CM

## 2017-01-23 DIAGNOSIS — I2510 Atherosclerotic heart disease of native coronary artery without angina pectoris: Secondary | ICD-10-CM | POA: Diagnosis present

## 2017-01-23 DIAGNOSIS — I480 Paroxysmal atrial fibrillation: Secondary | ICD-10-CM | POA: Diagnosis present

## 2017-01-23 DIAGNOSIS — I959 Hypotension, unspecified: Secondary | ICD-10-CM | POA: Diagnosis present

## 2017-01-23 DIAGNOSIS — K922 Gastrointestinal hemorrhage, unspecified: Secondary | ICD-10-CM

## 2017-01-23 DIAGNOSIS — F0390 Unspecified dementia without behavioral disturbance: Secondary | ICD-10-CM | POA: Diagnosis present

## 2017-01-23 DIAGNOSIS — I255 Ischemic cardiomyopathy: Secondary | ICD-10-CM | POA: Diagnosis present

## 2017-01-23 DIAGNOSIS — Z7902 Long term (current) use of antithrombotics/antiplatelets: Secondary | ICD-10-CM | POA: Diagnosis not present

## 2017-01-23 DIAGNOSIS — I351 Nonrheumatic aortic (valve) insufficiency: Secondary | ICD-10-CM | POA: Diagnosis present

## 2017-01-23 DIAGNOSIS — Z96651 Presence of right artificial knee joint: Secondary | ICD-10-CM | POA: Diagnosis present

## 2017-01-23 DIAGNOSIS — R627 Adult failure to thrive: Secondary | ICD-10-CM | POA: Diagnosis present

## 2017-01-23 DIAGNOSIS — N183 Chronic kidney disease, stage 3 (moderate): Secondary | ICD-10-CM | POA: Diagnosis present

## 2017-01-23 DIAGNOSIS — Z7982 Long term (current) use of aspirin: Secondary | ICD-10-CM | POA: Diagnosis not present

## 2017-01-23 DIAGNOSIS — D62 Acute posthemorrhagic anemia: Secondary | ICD-10-CM | POA: Diagnosis present

## 2017-01-23 DIAGNOSIS — Z66 Do not resuscitate: Secondary | ICD-10-CM | POA: Diagnosis present

## 2017-01-23 DIAGNOSIS — K5791 Diverticulosis of intestine, part unspecified, without perforation or abscess with bleeding: Secondary | ICD-10-CM | POA: Diagnosis present

## 2017-01-23 LAB — CBC WITH DIFFERENTIAL/PLATELET
BASOS ABS: 0.1 10*3/uL (ref 0.0–0.1)
Basophils Relative: 1 %
EOS PCT: 4 %
Eosinophils Absolute: 0.3 10*3/uL (ref 0.0–0.7)
HCT: 17.9 % — ABNORMAL LOW (ref 39.0–52.0)
Hemoglobin: 5.7 g/dL — CL (ref 13.0–17.0)
LYMPHS PCT: 47 %
Lymphs Abs: 4.1 10*3/uL — ABNORMAL HIGH (ref 0.7–4.0)
MCH: 31.8 pg (ref 26.0–34.0)
MCHC: 31.8 g/dL (ref 30.0–36.0)
MCV: 100 fL (ref 78.0–100.0)
MONO ABS: 0.8 10*3/uL (ref 0.1–1.0)
Monocytes Relative: 9 %
Neutro Abs: 3.5 10*3/uL (ref 1.7–7.7)
Neutrophils Relative %: 40 %
Platelets: 243 10*3/uL (ref 150–400)
RBC: 1.79 MIL/uL — ABNORMAL LOW (ref 4.22–5.81)
RDW: 20 % — AB (ref 11.5–15.5)
WBC: 8.8 10*3/uL (ref 4.0–10.5)

## 2017-01-23 LAB — BASIC METABOLIC PANEL
Anion gap: 5 (ref 5–15)
BUN: 37 mg/dL — AB (ref 6–20)
CALCIUM: 6.9 mg/dL — AB (ref 8.9–10.3)
CO2: 26 mmol/L (ref 22–32)
Chloride: 106 mmol/L (ref 101–111)
Creatinine, Ser: 1.04 mg/dL (ref 0.61–1.24)
GFR calc Af Amer: >60 mL/min (ref >60)
GLUCOSE: 128 mg/dL — AB (ref 65–99)
Potassium: 5 mmol/L (ref 3.5–5.1)
Sodium: 137 mmol/L (ref 135–145)

## 2017-01-23 LAB — I-STAT CG4 LACTIC ACID, ED: Lactic Acid, Venous: 2.91 mmol/L (ref 0.5–1.9)

## 2017-01-23 LAB — PREPARE RBC (CROSSMATCH)

## 2017-01-23 MED ORDER — POLYVINYL ALCOHOL 1.4 % OP SOLN: 1.0000 [drp] | Freq: Four times a day (QID) | OPHTHALMIC | Status: DC | PRN

## 2017-01-23 MED ORDER — GLYCOPYRROLATE 0.2 MG/ML IJ SOLN
0.2000 mg | INTRAMUSCULAR | Status: DC | PRN
  Administered 2017-01-24 – 2017-01-25 (×2): 0.2 mg via INTRAVENOUS
  Filled 2017-01-23 (×3): qty 1

## 2017-01-23 MED ORDER — GLYCOPYRROLATE 1 MG PO TABS: 1.0000 mg | ORAL_TABLET | ORAL | Status: DC | PRN

## 2017-01-23 MED ORDER — HALOPERIDOL LACTATE 2 MG/ML PO CONC: 0.5000 mg | ORAL | Status: DC | PRN

## 2017-01-23 MED ORDER — ONDANSETRON 4 MG PO TBDP
4.0000 mg | ORAL_TABLET | Freq: Four times a day (QID) | ORAL | Status: DC | PRN
  Filled 2017-01-23: qty 1

## 2017-01-23 MED ORDER — HALOPERIDOL LACTATE 5 MG/ML IJ SOLN: 0.5000 mg | INTRAMUSCULAR | Status: DC | PRN

## 2017-01-23 MED ORDER — HALOPERIDOL 0.5 MG PO TABS
0.5000 mg | ORAL_TABLET | ORAL | Status: DC | PRN
  Filled 2017-01-23: qty 1

## 2017-01-23 MED ORDER — BIOTENE DRY MOUTH MT LIQD: 15.0000 mL | OROMUCOSAL | Status: DC | PRN

## 2017-01-23 MED ORDER — MORPHINE SULFATE (PF) 4 MG/ML IV SOLN: 1.0000 mg | INTRAVENOUS | Status: DC | PRN

## 2017-01-23 MED ORDER — ONDANSETRON HCL 4 MG/2ML IJ SOLN: 4.0000 mg | Freq: Four times a day (QID) | INTRAMUSCULAR | Status: DC | PRN

## 2017-01-23 MED ORDER — GLYCOPYRROLATE 0.2 MG/ML IJ SOLN
0.2000 mg | INTRAMUSCULAR | Status: DC | PRN
  Filled 2017-01-23: qty 1

## 2017-01-23 MED ORDER — FENTANYL CITRATE (PF) 100 MCG/2ML IJ SOLN
25.0000 ug | INTRAMUSCULAR | Status: DC | PRN
  Administered 2017-01-23 (×3): 25 ug via INTRAVENOUS
  Filled 2017-01-23 (×3): qty 2

## 2017-01-23 MED ORDER — ACETAMINOPHEN 650 MG RE SUPP: 650.0000 mg | Freq: Four times a day (QID) | RECTAL | Status: DC | PRN

## 2017-01-23 MED ORDER — ACETAMINOPHEN 325 MG PO TABS: 650.0000 mg | ORAL_TABLET | Freq: Four times a day (QID) | ORAL | Status: DC | PRN

## 2017-01-23 MED ORDER — SODIUM CHLORIDE 0.9 % IV BOLUS (SEPSIS)
1000.0000 mL | Freq: Once | INTRAVENOUS | Status: AC
  Administered 2017-01-23: 1000 mL via INTRAVENOUS

## 2017-01-23 MED ORDER — SODIUM CHLORIDE 0.9 % IV SOLN
10.0000 mL/h | Freq: Once | INTRAVENOUS | Status: AC
  Administered 2017-01-23: 10 mL/h via INTRAVENOUS

## 2017-01-23 NOTE — ED Notes (Signed)
Brought 2nd unit of blood from blood bank.  Different family in room refused blood.  Dr. Preston Fleeting notified.  Blood returned to blood bank.

## 2017-01-23 NOTE — Progress Notes (Signed)
Pt's family is asking for a hospice consult at this time.  Call placed to Attending to notify of request.  AKingRNBSN

## 2017-01-23 NOTE — ED Notes (Addendum)
Family unsure if they want to sign consent for blood.  Would like to talk to MD about plan, Preston Fleeting MD notified. Blood bank called [facility] shortage pathologist okay'd [facility].

## 2017-01-23 NOTE — ED Notes (Signed)
Report attempted 

## 2017-01-23 NOTE — H&P (Addendum)
History and Physical    Danny Oneill ZOX:[id number] DOB: 1931/07/17 DOA: Feb 02, 2017  PCP: Geoffry Paradise, MD  Patient coming from: SNF  I have personally briefly reviewed patient's old medical records in [facility]  Chief Complaint: Dark blood per rectum  HPI: Danny Oneill is a 83 y.o. male with medical history significant of asbestosis, CAD, CHF, PAF, stage 3 CKD.  Patient was recently admitted on 8/30 for hip fx, complicated by e.coli sepsis, ICU admission with intubation and pressors, C.Diff colitis.  Please see discharge summary on 10/11 for a full review of the admission.  He is now brought to the ED with dark red blood per rectum noted today at SNF.  Patient is unaware of this.  Hypotensive with EMS.  Uncertain how many episodes or how long he has been having rectal bleeding.   ED Course: Continued to be hypotensive with SBP getting as low as 66 systolic.  Got 1 unit PRBC transfusion with some improvement in BP to upper 70s, but family declined second unit transfusion and wants him made comfort measures at this point.   Review of Systems: Unable to perform.  Past Medical History:  Diagnosis Date  . Anemia   . Aortic insufficiency 03/09/2016   a. mild-mod AI by echo 03/04/16.  . Arthritis   . Asbestosis (HCC)    "no breathing problems" pt states worked in [employer] for many yrs and was exposed to asbestosis  . Bladder outlet obstruction 03/09/2016  . CAD (coronary artery disease)    a. 02/2016: inf lat STEMI with occluded OM2 which was treated with DES; otherwise he had moderate 50% oRCA, 40% oLM, 70% D1, 10% mLAD, 80% lateral 2nd marg which was treated medically.  . Cardiomyopathy, ischemic   . Chronic combined systolic and diastolic CHF (congestive heart failure) (HCC)    a. EF 30-35% at time of STEMI 02/2016, improved to 40-45% by echo 11/20 (but with pericardial effusion). b. Further improved EF to 55-60% 03/04/16.  Marland Kitchen CKD (chronic kidney disease), stage III  (HCC)   . Coronary artery disease   . Dyspnea   . First degree AV block   . Hip fracture (HCC) 11/2016  . Hyperlipidemia   . Hyponatremia   . Lumbar spondylosis   . Myocardial infarction (HCC)   . Orthostatic hypotension   . PAF (paroxysmal atrial fibrillation) (HCC)    a. dx 02/2016: not anticoagulated due to pericardial effusion; amiodarone stopped due to suspected allergic drug rash.  . Pericardial effusion    a. 02/2016 - followed clinically (mod-large at first then small-mod in f/u echo).    Past Surgical History:  Procedure Laterality Date  . CARDIAC CATHETERIZATION N/A 02/02/2016   Procedure: Left Heart Cath and Coronary Angiography;  Surgeon: Corky Crafts, MD;  Location: [facility] INVASIVE CV LAB;  Service: Cardiovascular;  Laterality: N/A;  . CARDIAC CATHETERIZATION N/A 02/02/2016   Procedure: Coronary Stent Intervention;  Surgeon: Corky Crafts, MD;  Location: [facility] INVASIVE CV LAB;  Service: Cardiovascular;  Laterality: N/A;  . CARDIAC CATHETERIZATION N/A 02/02/2016   Procedure: IABP Insertion;  Surgeon: Corky Crafts, MD;  Location: [facility] INVASIVE CV LAB;  Service: Cardiovascular;  Laterality: N/A;  . CATARACTS REMOVED     BIL  . CORONARY STENT PLACEMENT  01/2016  . INTRAMEDULLARY (IM) NAIL INTERTROCHANTERIC Left 12/08/2016   Procedure: INTRAMEDULLARY (IM) NAIL INTERTROCHANTRIC;  Surgeon: Yolonda Kida, MD;  Location: [facility] OR;  Service: Orthopedics;  Laterality: Left;  . IR PERC CHOLECYSTOSTOMY  12/15/2016  . ROTATOR CUFF REPAIR  2011   L SHOULDER  . TOTAL KNEE ARTHROPLASTY Right 10/03/2013   Procedure: RIGHT TOTAL KNEE ARTHROPLASTY;  Surgeon: [name], MD;  Location: [facility] ORS;  Service: Orthopedics;  Laterality: Right;     reports that he has never smoked. He has never used smokeless tobacco. He reports that he does not drink alcohol or use drugs.  Allergies  Allergen Reactions  . Amiodarone Rash  . Morphine And Related Other (See Comments)     Confusion/ hallucinations     Family History  Problem Relation Age of Onset  . Cancer Mother      Prior to Admission medications   Medication Sig Start Date End Date Taking? Authorizing Provider  aspirin EC 81 MG tablet Take 81 mg by mouth daily with breakfast.    Yes [provider]  atorvastatin (LIPITOR) 20 MG tablet Take 1 tablet (20 mg total) by mouth daily. 04/07/16  Yes Lennette Bihari, MD  carvedilol (COREG) 3.125 MG tablet Take 3.125 mg by mouth 2 (two) times daily. 11/18/16  Yes [provider]  furosemide (LASIX) 20 MG tablet Take 1 tablet by mouth daily and ok to use extra tablet as needed for swelling. Patient taking differently: Take 20 mg by mouth daily.  10/14/16  Yes Cardama, Amadeo Garnet, MD  Hydrocortisone (GERHARDT'S BUTT CREAM) CREA Apply 1 application topically 2 (two) times daily. Patient taking differently: Apply 1 application topically 3 (three) times daily as needed for irritation.  01/15/17  Yes Rhetta Mura, MD  magic mouthwash SOLN Take 5 mLs by mouth 3 (three) times daily. 01/12/17  Yes Rhetta Mura, MD  midodrine (PROAMATINE) 10 MG tablet Take 1 tablet (10 mg total) by mouth 3 (three) times daily with meals. 01/12/17  Yes Rhetta Mura, MD  potassium chloride SA (K-DUR,KLOR-CON) 20 MEQ tablet Take 2 tablets (40 mEq total) by mouth daily. 01/12/17  Yes Rhetta Mura, MD  tamsulosin (FLOMAX) 0.4 MG CAPS capsule TAKE 1 CAPSULE(0.4 MG) BY MOUTH DAILY AFTER SUPPER 04/01/16  Yes Dunn, Dayna N, PA-C  ticagrelor (BRILINTA) 90 MG TABS tablet Take 1 tablet (90 mg total) by mouth 2 (two) times daily. 04/07/16  Yes Lennette Bihari, MD  vancomycin (VANCOCIN) 50 mg/mL oral solution Take 2.5 mLs (125 mg total) by mouth every 12 (twelve) hours. 01/15/17 01/23/17 Yes Rhetta Mura, MD  feeding supplement, ENSURE ENLIVE, (ENSURE ENLIVE) LIQD Take 237 mLs by mouth 3 (three) times daily with meals. Patient not taking: Reported on  01/23/2017 01/12/17   Rhetta Mura, MD  Maltodextrin-Xanthan Gum (RESOURCE THICKENUP CLEAR) POWD Take 125 g by mouth as needed. Patient not taking: Reported on 01/23/2017 01/12/17   Rhetta Mura, MD  vancomycin (VANCOCIN) 50 mg/mL oral solution Take 2.5 mLs (125 mg total) by mouth daily. 01/19/2017 01/29/17  Rhetta Mura, MD  vancomycin (VANCOCIN) 50 mg/mL oral solution Take 2.5 mLs (125 mg total) by mouth every other day. 01/30/17 02/06/17  Rhetta Mura, MD  vancomycin (VANCOCIN) 50 mg/mL oral solution Take 2.5 mLs (125 mg total) by mouth every 3 (three) days. 02/07/17 02/14/17  Rhetta Mura, MD    Physical Exam: Vitals:   01/23/17 0415 01/23/17 0430 01/23/17 0445 01/23/17 0500  BP: (!) 82/51 (!) 80/47 (!) 73/51 (!) 78/52  Pulse: 75 74 72 75  Resp: (!) 25 18 (!) 22 15  Temp:      TempSrc:      SpO2: 98% 95% 98% 98%  Weight:  Height:        Constitutional: NAD, calm, comfortable Eyes: PERRL, lids and conjunctivae normal ENMT: Mucous membranes are moist. Posterior pharynx clear of any exudate or lesions.Normal dentition.  Neck: normal, supple, no masses, no thyromegaly Respiratory: clear to auscultation bilaterally, no wheezing, no crackles. Normal respiratory effort. No accessory muscle use.  Cardiovascular: Regular rate and rhythm, no murmurs / rubs / gallops. No extremity edema. 2+ pedal pulses. No carotid bruits.  Abdomen: no tenderness, no masses palpated. No hepatosplenomegaly. Bowel sounds positive.  Dark red / black stool present in diaper. Musculoskeletal: no clubbing / cyanosis. No joint deformity upper and lower extremities. Good ROM, no contractures. Normal muscle tone.  Skin: no rashes, lesions, ulcers. No induration Neurologic: CN 2-12 grossly intact. Sensation intact, DTR normal. Strength 5/5 in all 4.  Psychiatric: Oriented to person.   Labs on Admission: I have personally reviewed following labs and imaging  studies  CBC:  Recent Labs Lab 01/14/2017 2351  WBC 8.8  NEUTROABS 3.5  HGB 5.7*  HCT 17.9*  MCV 100.0  PLT 243   Basic Metabolic Panel:  Recent Labs Lab 01/30/2017 2351  NA 137  K 5.0  CL 106  CO2 26  GLUCOSE 128*  BUN 37*  CREATININE 1.04  CALCIUM 6.9*   GFR: Estimated Creatinine Clearance: 66.9 mL/min (by C-G formula based on SCr of 1.04 mg/dL). Liver Function Tests: No results for input(s): AST, ALT, ALKPHOS, BILITOT, PROT, ALBUMIN in the last 168 hours. No results for input(s): LIPASE, AMYLASE in the last 168 hours. No results for input(s): AMMONIA in the last 168 hours. Coagulation Profile: No results for input(s): INR, PROTIME in the last 168 hours. Cardiac Enzymes: No results for input(s): CKTOTAL, CKMB, CKMBINDEX, TROPONINI in the last 168 hours. BNP (last 3 results) No results for input(s): PROBNP in the last 8760 hours. HbA1C: No results for input(s): HGBA1C in the last 72 hours. CBG: No results for input(s): GLUCAP in the last 168 hours. Lipid Profile: No results for input(s): CHOL, HDL, LDLCALC, TRIG, CHOLHDL, LDLDIRECT in the last 72 hours. Thyroid Function Tests: No results for input(s): TSH, T4TOTAL, FREET4, T3FREE, THYROIDAB in the last 72 hours. Anemia Panel: No results for input(s): VITAMINB12, FOLATE, FERRITIN, TIBC, IRON, RETICCTPCT in the last 72 hours. Urine analysis:    Component Value Date/Time   COLORURINE YELLOW 12/08/2016 2015   APPEARANCEUR TURBID (A) 12/08/2016 2015   LABSPEC 1.012 12/08/2016 2015   PHURINE 5.0 12/08/2016 2015   GLUCOSEU NEGATIVE 12/08/2016 2015   HGBUR LARGE (A) 12/08/2016 2015   BILIRUBINUR NEGATIVE 12/08/2016 2015   KETONESUR NEGATIVE 12/08/2016 2015   PROTEINUR 100 (A) 12/08/2016 2015   UROBILINOGEN 1.0 09/20/2013 0851   NITRITE NEGATIVE 12/08/2016 2015   LEUKOCYTESUR MODERATE (A) 12/08/2016 2015    Radiological Exams on Admission: No results found.  EKG: Independently  reviewed.  Assessment/Plan Active Problems:   Admission for end of life care    1. LGIB - family desires comfort measures and decline further PRBC transfusions 1. Patient has been doing very poorly over past couple of months: he just spent > 40 days in hospital for hip fx, e.coli sepsis with pressors and intubation, C.Diff colitis. 1. Note that pal care / comfort focused path was offered to patient family last admit as well. 2. At this point will stop further blood draws 3. EOL care focused order set 4. Not in any pain right now so will hold off on ordering a narcotic gtt but will instead  order PRN 5. Have asked flow management to see if they can expedite getting patient a bed  DVT prophylaxis: None due to end of life care Code Status: DNR Family Communication: Family at bedside Disposition Plan: TBD Consults called: Pal care consult put in to epic Admission status: Admit to inpatient - inpatient status for end of life care   Hillary Bow DO [facility] Pager [phone]  If 7AM-7PM, please contact day team taking care of patient [email or web address] Password [email or web address]  01/23/2017, 5:27 AM

## 2017-01-23 NOTE — Progress Notes (Signed)
Pt's family wants diet discontinued as pt is not taking PO and is comfort/palliative care at this time.  AKingRNBSN

## 2017-01-23 NOTE — Progress Notes (Signed)
PROGRESS NOTE   Danny Oneill  ZOX:[id number]    DOB: May 12, 1931    DOA: 01/15/2017  PCP: Geoffry ParadiseAronson, Richard, MD   I have briefly reviewed patients [facility].  Brief Narrative:  83 year old male with PMH of asbestosis, CAD, chronic systolic and diastolic CHF, first-degree AV block, PAF, HLD, bladder outlet obstruction, stage III chronic kidney disease, protracted recent hospitalization from 12/03/16-01/15/17 for hip fracture status post fall, hospital course complicated by acute metabolic encephalopathy, sepsis due to Escherichia coli UTI, C. difficile colitis, acute acalculous cholecystitis treated with percutaneous drain, acute respiratory failure with hypoxia requiring intubation, acute renal failure, urinary retention requiring indwelling Foley catheter, multiple electrolyte abnormalities, presented from SNF to the ED with history of dark red blood per rectum and hypotension. Remained hypotensive in ED, hemoglobin 5.7, received a unit of PRBC. Family then decided to transition him to full comfort care and all aggressive measures were discontinued.   Assessment & Plan:   Active Problems:   Admission for end of life care   Acute lower GI bleed Unclear etiology but may have several differential diagnoses. Recent protracted C. difficile colitis. Patient has been doing poorly for the last couple of months, general failure to thrive. Family have decided to transition him to complete comfort care and discontinued all aggressive measures.  Acute blood loss anemia Secondary to GI bleed. Now end-of-life comfort care.  Acute encephalopathy - Secondary to acute illness complicating likely underlying dementia.  Hypotension Secondary to acute blood loss.   DVT prophylaxis: None due to end of life care Code Status: DO NOT RESUSCITATE Family Communication: Discussed in detail with patient's son and daughter-in-law at bedside. Updated care and answered  questions. Disposition: Will consult clinical social work for residential hospice.   Consultants:  Palliative care consulted.  Procedures:  None  Antimicrobials:  None    Subjective: Mostly unresponsive. Has his eyes closed and intermittently mumbles incomprehensively. As per son at bedside, drank a sip of water earlier this morning.   ROS: Unable due to mental status changes.  Objective:  Vitals:   01/23/17 0515 01/23/17 0530 01/23/17 0644 01/23/17 1200  BP: (!) 78/53 (!) 88/52 (!) 77/43 (!) 71/40  Pulse: 75 74 73 73  Resp: 12 12 10 16   Temp:   97.6 F (36.4 C) 97.6 F (36.4 C)  TempSrc:   Oral Axillary  SpO2: 100% 100% 91% 100%  Weight:      Height:        Examination:  General exam: Elderly male, not currently built, frail and chronically ill-looking lying comfortably propped up in bed. Respiratory system: Poor inspiratory effort. Diminished breath sounds in the bases. No wheezing, rhonchi or crackles appreciated. No increased work of breathing. Cardiovascular system: S1 & S2 heard, RRR. No JVD, murmurs, rubs, gallops or clicks. No pedal edema. Gastrointestinal system: Abdomen is nondistended, soft and nontender. No organomegaly or masses felt. Normal bowel sounds heard. Central nervous system: Mental status as indicated above. Does not follow instructions. No eye opening. No focal neurological deficits. Extremities: Moves all limbs symmetrically. Skin: Not examined Psychiatry: Judgement and insight impaired.     Data Reviewed: I have personally reviewed following labs and imaging studies  CBC:  Recent Labs Lab 02/01/2017 2351  WBC 8.8  NEUTROABS 3.5  HGB 5.7*  HCT 17.9*  MCV 100.0  PLT 243   Basic Metabolic Panel:  Recent Labs Lab 02/02/2017 2351  NA 137  K 5.0  CL 106  CO2 26  GLUCOSE 128*  BUN 37*  CREATININE 1.04  CALCIUM 6.9*         Radiology Studies: No results found.      Scheduled Meds: Continuous Infusions:   LOS: 0  days     Treyveon Mochizuki, MD, FACP, FHM. [facility] Pager [phone]  If 7PM-7AM, please contact night-coverage [email or web address] Password [email or web address] 01/23/2017, 12:56 PM

## 2017-01-24 DIAGNOSIS — K922 Gastrointestinal hemorrhage, unspecified: Secondary | ICD-10-CM

## 2017-01-24 DIAGNOSIS — Z515 Encounter for palliative care: Secondary | ICD-10-CM

## 2017-01-24 MED ORDER — FENTANYL CITRATE (PF) 100 MCG/2ML IJ SOLN
25.0000 ug | INTRAMUSCULAR | Status: DC | PRN
  Administered 2017-01-24 – 2017-01-25 (×2): 25 ug via INTRAVENOUS
  Filled 2017-01-24 (×3): qty 2

## 2017-01-24 MED ORDER — GLYCOPYRROLATE 0.2 MG/ML IJ SOLN
0.2000 mg | Freq: Four times a day (QID) | INTRAMUSCULAR | Status: DC
  Administered 2017-01-24: 0.2 mg via INTRAVENOUS
  Filled 2017-01-24 (×4): qty 1

## 2017-01-24 MED ORDER — FENTANYL CITRATE (PF) 100 MCG/2ML IJ SOLN
25.0000 ug | Freq: Four times a day (QID) | INTRAMUSCULAR | Status: DC
  Administered 2017-01-24 – 2017-01-25 (×3): 25 ug via INTRAVENOUS
  Filled 2017-01-24 (×2): qty 2

## 2017-01-24 MED ORDER — GLYCOPYRROLATE 0.2 MG/ML IJ SOLN
0.4000 mg | INTRAMUSCULAR | Status: DC | PRN
  Filled 2017-01-24: qty 2

## 2017-01-24 MED ORDER — LORAZEPAM 2 MG/ML IJ SOLN: 0.5000 mg | INTRAMUSCULAR | Status: DC | PRN

## 2017-01-24 NOTE — Consult Note (Signed)
Consultation Note Date: 01/24/2017   Patient Name: Danny Oneill  DOB: 1931/05/23  MRN: [id number]  Age / Sex: 83 y.o., male  PCP: Burnard Bunting, MD Referring Physician: Modena Jansky, MD  Reason for Consultation: Establishing goals of care, Hospice Evaluation, Inpatient hospice referral, Non pain symptom management, Pain control, Psychosocial/spiritual support and Terminal Care  HPI/Patient Profile: 83 y.o. male  with past medical history of asbestosis, CAD, chronic systolic and diastolic CHF, first-degree AV block, PAF, HLD, bladder outlet obstruction, stage III chronic kidney disease, protracted recent hospitalization from 12/03/16-01/15/17 for hip fracture status post fall, hospital course complicated by acute metabolic encephalopathy, sepsis due to Escherichia coli UTI, C. difficile colitis, acute acalculous cholecystitis treated with percutaneous drain, acute respiratory failure with hypoxia requiring intubation, acute renal failure, urinary retention requiring indwelling Foley catheter,   admitted on 01/16/2017 from SNF to the ED with history of dark red blood per rectum and hypotension. Remained hypotensive in ED, hemoglobin 5.7, received a unit of PRBC. Family then decided to transition him to full comfort care and all aggressive measures were discontinued.  Pt has been seen by PMT during last hospitalization for GOC. Family states, they remained hopeful for rehab and improvement but pt did not walk again. His wife, their mother, has stage 4 cancer and is in the ED at the time of this assessment    Clinical Assessment and Goals of Care: Met with pt's 3 sons at the bedside. Pt is minimally responsive and unable to participate in assessment. He is non verbal since last night. He is showing non-verbal s/s of pain ( grimacing), as well as shallow, irregular respirations (no current apnea). Family was  originally thinking of transfer to residential hospice for EOL care but now are concerned that he is too unstable for transport ( BP 63/37). Spoke to [employer] who stated at this time there are no beds  Pt's spouse but she is in the ED. She , per chart review as well as discussion with her sons, elected comfort care. All are in agreement    SUMMARY OF RECOMMENDATIONS   Confirmed DNR/DNI Confirmed comfort care At this point, concerned that pt is too unstable for transport; additionally, no beds at [facility]:  DNR    Symptom Management:   Pain/Dyspnea: Pt is grimacing. He has received 3 PRN fentanyl 25 mcg since admission. After discussion with sons, will schedule fentanyl 25 mcg q6 ATC and continue with 25-50 mcg q1 PRN. Had originally discussed change to dilaudid but see intolerance to morphine noted on allergy list as well as fentanyl has been effective. Monitor for need for continuous iunfusion  Secretions: Start scheduled robinul 0.2 mg IV q6 ATC and q4 PRN  Anxiety/agitation: Cont with PRN haldol. Will add PRN ativan. Monitor for need for scheduled dosing  Palliative Prophylaxis:   Aspiration, Bowel Regimen, Delirium Protocol, Eye Care, Frequent Pain Assessment, Oral Care and Turn Reposition  Additional Recommendations (Limitations, Scope, Preferences):  Full Comfort Care  Psycho-social/Spiritual:  Desire for further Chaplaincy support:no  Additional Recommendations: Grief/Bereavement Support  Prognosis:   Hours - Days  Discharge Planning: Anticipated hospital death. Pt too unstable for transport; BP 63/37. Family is struggling with idea of "what if something happens in the ambulance", as well as their mother currently in ED with [facility] 2/2 stage 4 cancer      Primary Diagnoses: Present on Admission: **None**   I have reviewed the medical record, interviewed the patient and family, and examined the patient.  The following aspects are pertinent.  Past Medical History:  Diagnosis Date  . Anemia   . Aortic insufficiency 03/09/2016   a. mild-mod AI by echo 03/04/16.  . Arthritis   . Asbestosis ([place])    "no breathing problems" pt states worked in [employer] for many yrs and was exposed to asbestosis  . Bladder outlet obstruction 03/09/2016  . CAD (coronary artery disease)    a. 02/2016: inf lat STEMI with occluded OM2 which was treated with DES; otherwise he had moderate 50% oRCA, 40% oLM, 70% D1, 10% mLAD, 80% lateral 2nd marg which was treated medically.  . Cardiomyopathy, ischemic   . Chronic combined systolic and diastolic CHF (congestive heart failure) (HCC)    a. EF 30-35% at time of STEMI 02/2016, improved to 40-45% by echo 11/20 (but with pericardial effusion). b. Further improved EF to 55-60% 03/04/16.  Marland Kitchen CKD (chronic kidney disease), stage III ([place])   . Coronary artery disease   . Dyspnea   . First degree AV block   . Hip fracture ([place]) 11/2016  . Hyperlipidemia   . Hyponatremia   . Lumbar spondylosis   . Myocardial infarction ([place])   . Orthostatic hypotension   . PAF (paroxysmal atrial fibrillation) ([place])    a. dx 02/2016: not anticoagulated due to pericardial effusion; amiodarone stopped due to suspected allergic drug rash.  . Pericardial effusion    a. 02/2016 - followed clinically (mod-large at first then small-mod in f/u echo).   Social History   Social History  . Marital status: Married    Spouse name: N/A  . Number of children: N/A  . Years of education: N/A   Social History Main Topics  . Smoking status: Never Smoker  . Smokeless tobacco: Never Used  . Alcohol use No  . Drug use: No  . Sexual activity: Not Asked   Other Topics Concern  . None   Social History Narrative  . None   Family History  Problem Relation Age of Onset  . Cancer Mother    Scheduled Meds: . fentaNYL (SUBLIMAZE) injection  25 mcg Intravenous Q6H  . glycopyrrolate  0.2 mg  Intravenous Q6H   Continuous Infusions: PRN Meds:.acetaminophen **OR** acetaminophen, antiseptic oral rinse, fentaNYL (SUBLIMAZE) injection, glycopyrrolate **OR** glycopyrrolate **OR** glycopyrrolate, haloperidol **OR** haloperidol **OR** haloperidol lactate, LORazepam, ondansetron **OR** ondansetron (ZOFRAN) IV, polyvinyl alcohol Medications Prior to Admission:  Prior to Admission medications   Medication Sig Start Date End Date Taking? Authorizing Provider  aspirin EC 81 MG tablet Take 81 mg by mouth daily with breakfast.    Yes [provider]  atorvastatin (LIPITOR) 20 MG tablet Take 1 tablet (20 mg total) by mouth daily. 04/07/16  Yes Troy Sine, MD  carvedilol (COREG) 3.125 MG tablet Take 3.125 mg by mouth 2 (two) times daily. 11/18/16  Yes [provider]  furosemide (LASIX) 20 MG tablet Take 1 tablet by mouth daily and ok to use extra tablet as needed for swelling. Patient taking differently: Take  20 mg by mouth daily.  10/14/16  Yes Cardama, Grayce Sessions, MD  Hydrocortisone (GERHARDT'S BUTT CREAM) CREA Apply 1 application topically 2 (two) times daily. Patient taking differently: Apply 1 application topically 3 (three) times daily as needed for irritation.  01/15/17  Yes Nita Sells, MD  magic mouthwash SOLN Take 5 mLs by mouth 3 (three) times daily. 01/12/17  Yes Nita Sells, MD  midodrine (PROAMATINE) 10 MG tablet Take 1 tablet (10 mg total) by mouth 3 (three) times daily with meals. 01/12/17  Yes Nita Sells, MD  potassium chloride SA (K-DUR,KLOR-CON) 20 MEQ tablet Take 2 tablets (40 mEq total) by mouth daily. 01/12/17  Yes Nita Sells, MD  tamsulosin (FLOMAX) 0.4 MG CAPS capsule TAKE 1 CAPSULE(0.4 MG) BY MOUTH DAILY AFTER SUPPER 04/01/16  Yes Dunn, Dayna N, PA-C  ticagrelor (BRILINTA) 90 MG TABS tablet Take 1 tablet (90 mg total) by mouth 2 (two) times daily. 04/07/16  Yes Troy Sine, MD  feeding supplement, ENSURE ENLIVE,  (ENSURE ENLIVE) LIQD Take 237 mLs by mouth 3 (three) times daily with meals. Patient not taking: Reported on 01/23/2017 01/12/17   Nita Sells, MD  Maltodextrin-Xanthan Gum (RESOURCE THICKENUP CLEAR) POWD Take 125 g by mouth as needed. Patient not taking: Reported on 01/23/2017 01/12/17   Nita Sells, MD  vancomycin (VANCOCIN) 50 mg/mL oral solution Take 2.5 mLs (125 mg total) by mouth daily. 01/09/2017 01/29/17  Nita Sells, MD  vancomycin (VANCOCIN) 50 mg/mL oral solution Take 2.5 mLs (125 mg total) by mouth every other day. 01/30/17 02/06/17  Nita Sells, MD  vancomycin (VANCOCIN) 50 mg/mL oral solution Take 2.5 mLs (125 mg total) by mouth every 3 (three) days. 02/07/17 02/14/17  Nita Sells, MD   Allergies  Allergen Reactions  . Amiodarone Rash  . Morphine And Related Other (See Comments)    Confusion/ hallucinations    Review of Systems  Unable to perform ROS   Physical Exam  Constitutional:  Acutely ill appearing elderly man; minimally responsive Transitioning towards EOL  Cardiovascular:  Tachy hypotensive  Pulmonary/Chest:  Upper airway secretions Mild increased work of breathing  Genitourinary:  Genitourinary Comments: foley  Neurological:  Non-verbal Minimally responsive except to turning, touch, movement  Skin:  cool  Psychiatric:  Unable to test  Nursing note and vitals reviewed.   Vital Signs: BP (!) 63/37 (BP Location: Right Arm)   Pulse 75   Temp 97.7 F (36.5 C) (Oral)   Resp 18   Ht '6\' 1"'  (1.854 m)   Wt 99.8 kg (220 lb)   SpO2 100%   BMI 29.03 kg/m  Pain Assessment: PAINAD   Pain Score: Asleep   SpO2: SpO2: 100 % O2 Device:SpO2: 100 % O2 Flow Rate: .   IO: Intake/output summary: No intake or output data in the 24 hours ending 01/24/17 0932  LBM: Last BM Date: 01/23/17 Baseline Weight: Weight: 99.8 kg (220 lb) Most recent weight: Weight: 99.8 kg (220 lb)     Palliative  Assessment/Data:   Flowsheet Rows     Most Recent Value  Intake Tab  Referral Department  Hospitalist  Unit at Time of Referral  Med/Surg Unit  Palliative Care Primary Diagnosis  Other (Comment)  Date Notified  01/23/17  Palliative Care Type  Return patient Palliative Care  Reason for referral  Pain, Non-pain Symptom, Counsel Regarding Hospice, Psychosocial or Spiritual support, End of Life Care Assistance  Date of Admission  01/15/2017  Date first seen by Palliative Care  01/24/17  #  of days Palliative referral response time  1 Day(s)  # of days IP prior to Palliative referral  1  Clinical Assessment  Palliative Performance Scale Score  20%  Pain Max last 24 hours  Not able to report  Pain Min Last 24 hours  Not able to report  Dyspnea Max Last 24 Hours  Not able to report  Dyspnea Min Last 24 hours  Not able to report  Nausea Max Last 24 Hours  Not able to report  Nausea Min Last 24 Hours  Not able to report  Anxiety Max Last 24 Hours  Not able to report  Anxiety Min Last 24 Hours  Not able to report  Other Max Last 24 Hours  Not able to report  Psychosocial & Spiritual Assessment  Palliative Care Outcomes  Patient/Family meeting held?  Yes  Who was at the meeting?  3 sons  Palliative Care Outcomes  Improved pain interventions, Improved non-pain symptom therapy, Clarified goals of care, Provided psychosocial or spiritual support  Patient/Family wishes: Interventions discontinued/not started   Mechanical Ventilation, Antibiotics, BiPAP, Tube feedings/TPN, Hemodialysis, NIPPV, Trach, Transfusion, Vasopressors, PEG      Time In: 0830 Time Out: 0945 Time Total: 75 min Greater than 50%  of this time was spent counseling and coordinating care related to the above assessment and plan. Staffed with Dr. Algis Liming  Signed by: Dory Horn, NP   Please contact Palliative Medicine Team phone at [phone] for questions and concerns.  For individual provider: See  Shea Evans

## 2017-01-24 NOTE — Progress Notes (Signed)
Clinical Social Worker acknowledge consult for residential hospice placement. This consult is inappropriate as patient is to unstable for discharge. Per palliative note, patient will be comfort care and will remain in the hospital until his death. CSW signing off. Please consult CSW if any new needs arise.   Marrianne Mood, MSW,  [employer] [phone]

## 2017-01-24 NOTE — Progress Notes (Signed)
PROGRESS NOTE   Danny LatheJames Oneill  ZOX:[id number]    DOB: [birth year]    DOA: 07-08-16  PCP: Geoffry ParadiseAronson, Richard, MD   I have briefly reviewed patients [facility].  Brief Narrative:  83 year old male with PMH of asbestosis, CAD, chronic systolic and diastolic CHF, first-degree AV block, PAF, HLD, bladder outlet obstruction, stage III chronic kidney disease, protracted recent hospitalization from 12/03/16-01/15/17 for hip fracture status post fall, hospital course complicated by acute metabolic encephalopathy, sepsis due to Escherichia coli UTI, C. difficile colitis, acute acalculous cholecystitis treated with percutaneous drain, acute respiratory failure with hypoxia requiring intubation, acute renal failure, urinary retention requiring indwelling Foley catheter, multiple electrolyte abnormalities, presented from SNF to the ED with history of dark red blood per rectum and hypotension. Remained hypotensive in ED, hemoglobin 5.7, received a unit of PRBC. Family then decided to transition him to full comfort care and all aggressive measures were discontinued.   Assessment & Plan:   Active Problems:   Admission for end of life care   Gastrointestinal hemorrhage   Palliative care by specialist   Acute lower GI bleed Unclear etiology but may have several differential diagnoses. Recent protracted C. difficile colitis. Patient has been doing poorly for the last couple of months, general failure to thrive. Family have decided to transition him to complete comfort care and discontinued all aggressive measures. Palliative care input appreciated. Discussed with Ms. Larence PenningBullard. Full comfort care. Unstable for transfer to residential hospice (BP 63/37) and likely hospital death. Unfortunately patient's spouse also in the ED for evaluation of pleural effusion and stage IV cancer.  Acute blood loss anemia Secondary to GI bleed. Now end-of-life comfort care.  Acute  encephalopathy - Secondary to acute illness complicating likely underlying dementia. Patient unresponsive now.  Hypotension Secondary to acute blood loss.   DVT prophylaxis: None due to end of life care Code Status: DO NOT RESUSCITATE Family Communication: Discussed in detail with patient's son, daughter, sister and brother-in-law at bedside. Updated care and answered questions. Provided comfort. Disposition: Likely hospital death.   Consultants:  Palliative care consulted.  Procedures:  None  Antimicrobials:  None    Subjective: Seen this morning. Patient unresponsive and does not appear to be in any distress. As per family at bedside, no discomfort noted, unresponsive.   ROS: Not indicated at this time.  Objective:  Vitals:   01/23/17 0644 01/23/17 1200 01/23/17 1900 01/24/17 0300  BP: (!) 77/43 (!) 71/40 (!) 63/35 (!) 63/37  Pulse: 73 73 77 75  Resp: 10 16 18 18   Temp: 97.6 F (36.4 C) 97.6 F (36.4 C) 97.7 F (36.5 C) 97.7 F (36.5 C)  TempSrc: Oral Axillary Oral Oral  SpO2: 91% 100%    Weight:      Height:        Examination:  General exam: Elderly male, not currently built, frail and chronically ill-looking lying comfortably propped up in bed.Does not appear in any distress. Respiratory system: No respiratory distress noted. Central nervous system: Unresponsive.     Data Reviewed: I have personally reviewed following labs and imaging studies  CBC:  Recent Labs Lab 16-Feb-2017 2351  WBC 8.8  NEUTROABS 3.5  HGB 5.7*  HCT 17.9*  MCV 100.0  PLT 243   Basic Metabolic Panel:  Recent Labs Lab 16-Feb-2017 2351  NA 137  K 5.0  CL 106  CO2 26  GLUCOSE 128*  BUN 37*  CREATININE 1.04  CALCIUM 6.9*  Radiology Studies: No results found.      Scheduled Meds: . fentaNYL (SUBLIMAZE) injection  25 mcg Intravenous Q6H  . glycopyrrolate  0.2 mg Intravenous Q6H   Continuous Infusions:   LOS: 1 day     Kimley Apsey, MD, FACP,  FHM. [facility] Pager [phone]  If 7PM-7AM, please contact night-coverage [email or web address] Password [email or web address] 01/24/2017, 2:24 PM

## 2017-01-26 ENCOUNTER — Inpatient Hospital Stay: Admission: RE | Admit: 2017-01-26 | Payer: PPO | Source: Ambulatory Visit

## 2017-01-26 ENCOUNTER — Other Ambulatory Visit: Payer: PPO

## 2017-01-26 LAB — TYPE AND SCREEN
ABO/RH(D): O NEG
ANTIBODY SCREEN: NEGATIVE
Unit division: 0
Unit division: 0
Unit division: 0
Unit division: 0

## 2017-01-26 LAB — BPAM RBC
BLOOD PRODUCT EXPIRATION DATE: 201811162359
BLOOD PRODUCT EXPIRATION DATE: 201811162359
Blood Product Expiration Date: 201811162359
Blood Product Expiration Date: 201811162359
ISSUE DATE / TIME: 201810200149
ISSUE DATE / TIME: 201810200407
UNIT TYPE AND RH: 5100
UNIT TYPE AND RH: 5100
Unit Type and Rh: 5100
Unit Type and Rh: 5100

## 2017-02-04 NOTE — Progress Notes (Signed)
Earlier in the shift RN was informed via phone by Dr. Linna Darner that the patient's family had called her and stated that the patient was uncomfortable, needed pain medication, and Robinul. RN was also informed from the charge nurse around the same time that the patient's children wanted the patient to be transferred to 6N so that he could receive more specialized palliative care. RN was unable to administer Robinul because it was administered last at 1831 with the next dose being due 6 hours late at 0031. RN offered patient/patient's family Fentanyl of which they declined. RN called Dr. Linna Darner back and asked if the Robinul order could be revised of which the Dr revised order from due every 6 hours to due every 4 hours. RN administered Robinul. Dr. Linna Darner advised RN to call Hospitalists for orders concerning transferring to 6N. RN then paged Hospitalists with a return call made from Schorr, NP. RN was advised that transferring patient to 6N was not able to granted at the present time. One of patient's children then went to 6N to inquire about the palliative care they provide on their floor. Patient's child returned to 5N with a 6N RN who went in to educate the family concerning palliative care measures. Patient's family requested to have patient's blood pressure taken. NT took patient's blood pressure. Nursing will continue to monitor.

## 2017-02-04 NOTE — Death Summary Note (Addendum)
  DEATH SUMMARY   Patient Details  Name: Danny Oneill MRN: [id number] DOB: 1931/06/08  Admission/Discharge Information   Admit Date:  Feb 14, 2017  Date of Death: Date of Death: Feb 17, 2017  Time of Death: Time of Death: 0453  Length of Stay: 2  Referring Physician: Geoffry Paradise, MD   Reason(s) for Hospitalization    Diagnoses  Preliminary cause of death:  Hemorrhagic shock from acute blood loss anemia secondary to acute lower GI bleed. Secondary Diagnoses (including complications and co-morbidities):  Active Problems:   Admission for end of life care   Gastrointestinal hemorrhage   Palliative care by specialist   Brief Hospital Course (including significant findings, care, treatment, and services provided and events leading to death)  Cerrone Barbush is a 83 y.o. year old male with PMH of asbestosis, CAD, chronic systolic and diastolic CHF, first-degree AV block, PAF, HLD, bladder outlet obstruction, stage III chronic kidney disease, protracted recent hospitalization from 12/03/16-01/15/17 for hip fracture status post fall, hospital course complicated by acute metabolic encephalopathy, sepsis due to Escherichia coli UTI, C. difficile colitis, acute acalculous cholecystitis treated with percutaneous drain, acute respiratory failure with hypoxia requiring intubation, acute renal failure, urinary retention requiring indwelling Foley catheter, multiple electrolyte abnormalities, presented from SNF to the ED with history of dark red blood per rectum and hypotension. Remained hypotensive in ED, hemoglobin 5.7, received a unit of PRBC. Family then decided to transition him to full comfort care and all aggressive measures were discontinued.Palliative care was consulted for assisting with EOL care. He subsequently died on 02/17/2017 at 4:53 AM.  Following were some issues addressed during this hospitalization:  Acute lower GI bleed Unclear etiology but may have several differential diagnoses  including probably diverticular bleeding. Recent protracted C. difficile colitis. Patient had been doing poorly for the last couple of months, general failure to thrive. Family decided to transition him to complete comfort care and discontinued all aggressive measures. He was unstable for transfer to residential hospice (BP 63/37) on 10/21. Unfortunately patient's spouse was also in the ED for evaluation of pleural effusion and stage IV cancer.  Acute blood loss anemia Secondary to GI bleed.   Acute encephalopathy - Secondary to acute illness complicating likely underlying dementia.   Hypotension/hemorrhagic shock Secondary to acute blood loss. `   Pertinent Labs and Studies  Significant Diagnostic Studies   Lab Basic Metabolic Panel:  Recent Labs Lab 2017-02-14 2351  NA 137  K 5.0  CL 106  CO2 26  GLUCOSE 128*  BUN 37*  CREATININE 1.04  CALCIUM 6.9*   CBC:  Recent Labs Lab 02/14/17 2351  WBC 8.8  NEUTROABS 3.5  HGB 5.7*  HCT 17.9*  MCV 100.0  PLT 243   Sepsis Labs:  Recent Labs Lab 02-14-17 2351 01/23/17 0018  WBC 8.8  --   LATICACIDVEN  --  2.91*    Procedures/Operations     Hargis Vandyne 17-Feb-2017, 7:46 AM

## 2017-02-04 DEATH — deceased

## 2018-05-21 IMAGING — US US ABDOMEN LIMITED
1 series · 14 of 25 positions shown · non-contrast
Comparison: 12/17/2016 plain film examination. 12/15/2016
cholecystogram. 12/12/2016 ultrasound.

CLINICAL DATA: 85-year-old male with elevated transaminases.
Percutaneous cholecystotomy. Subsequent encounter.

EXAM:
ULTRASOUND ABDOMEN LIMITED RIGHT UPPER QUADRANT

[Series 1: us abdomen limited · 0.25mm/px · 14 of 30 slices shown]
[im 1/30]
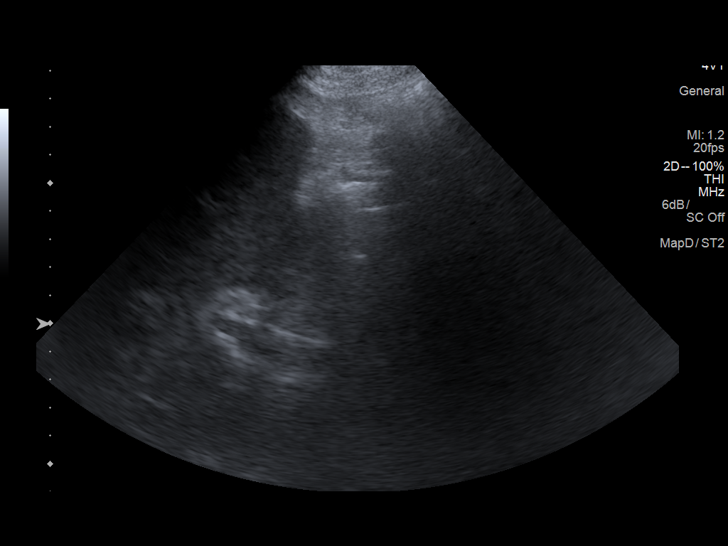
[im 3/30]
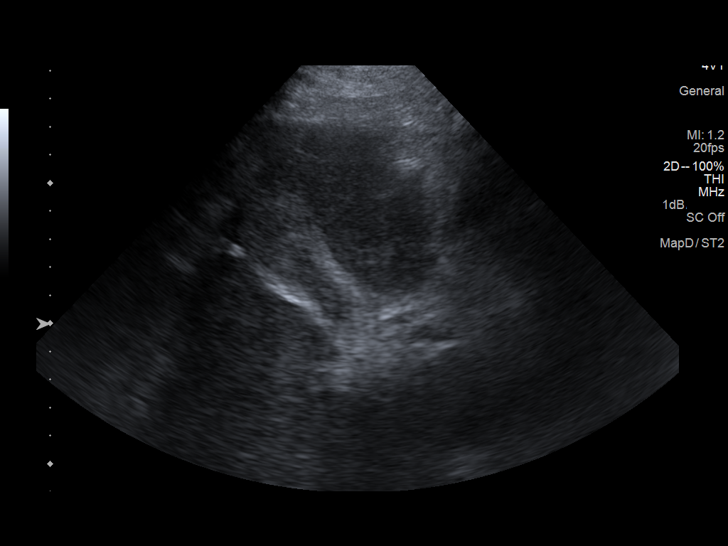
[im 5/30]
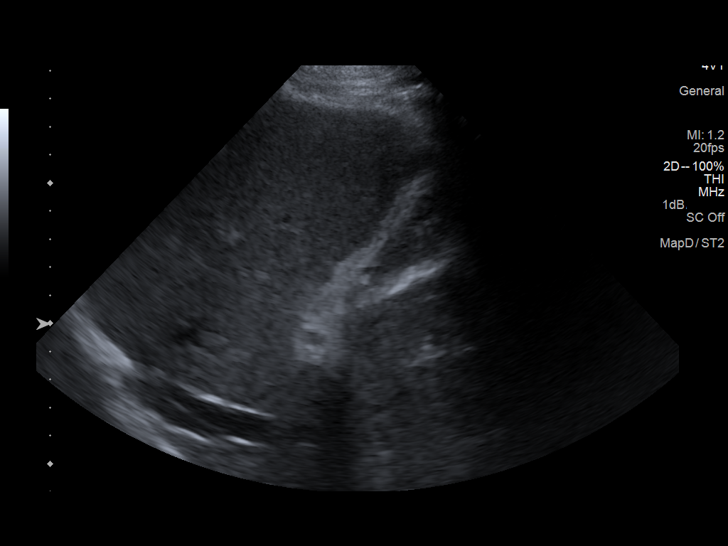
[im 8/30]
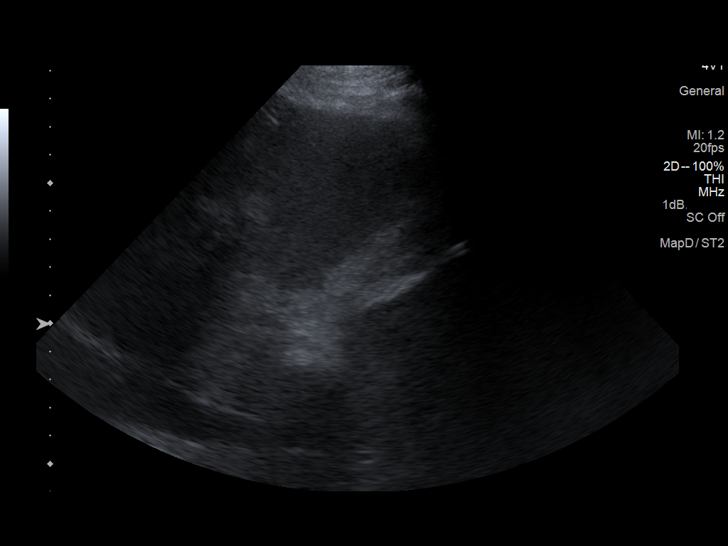
[im 10/30]
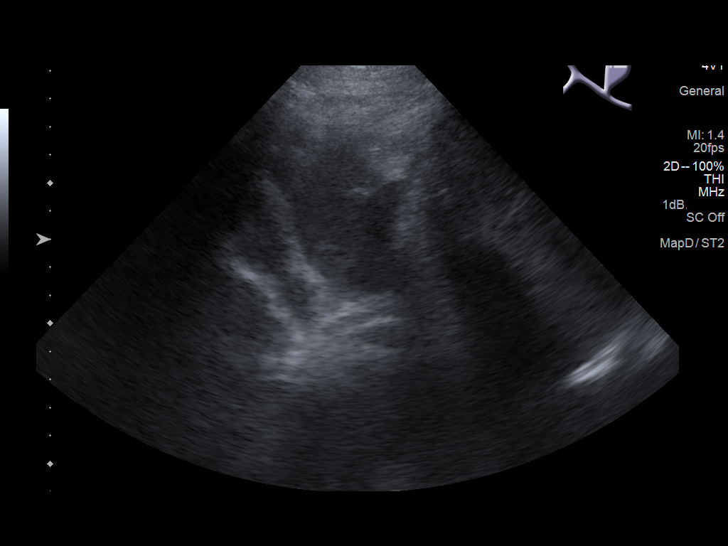
[im 11/30]
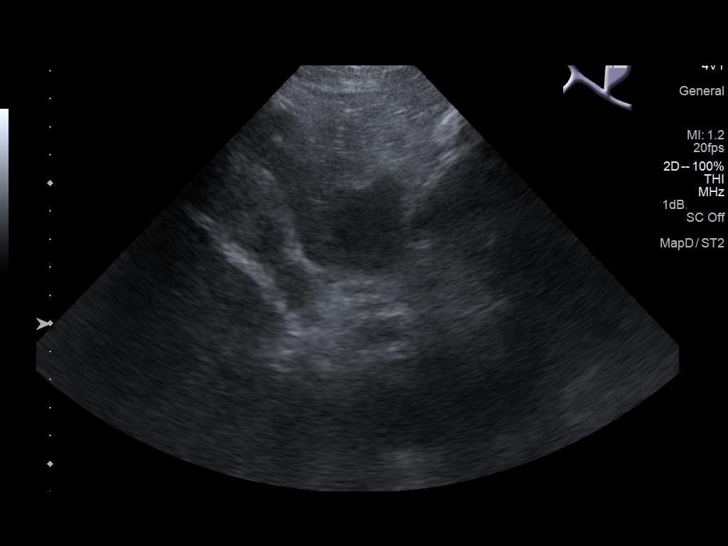
[im 14/30]
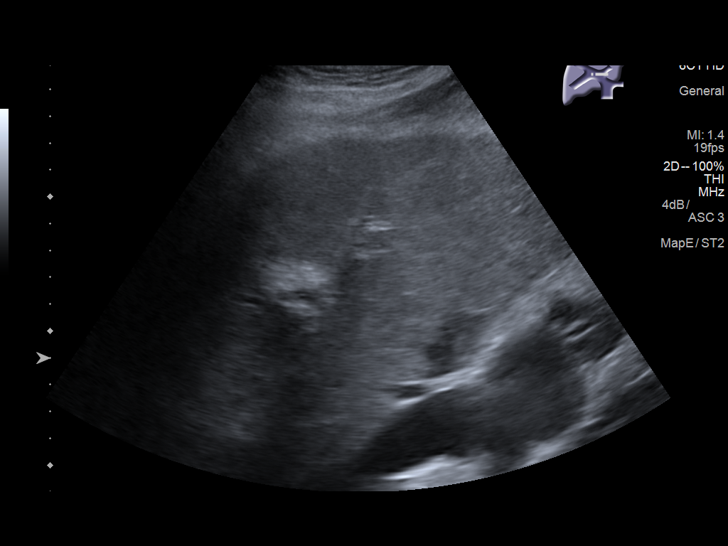
[im 16/30]
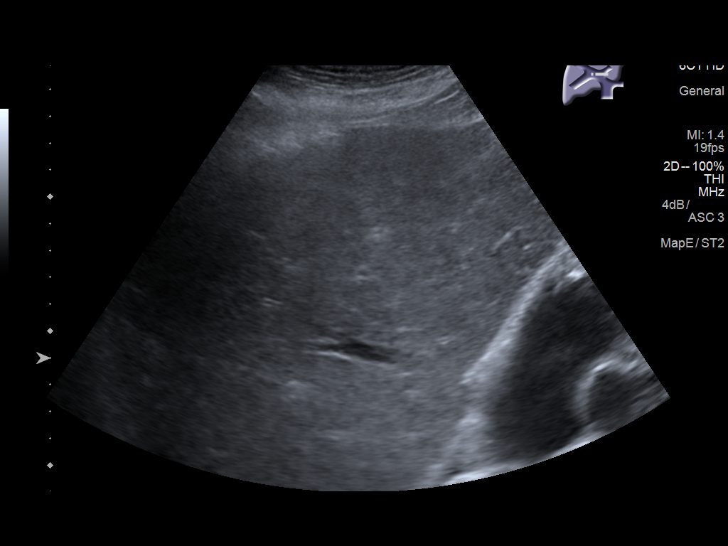
[im 19/30]
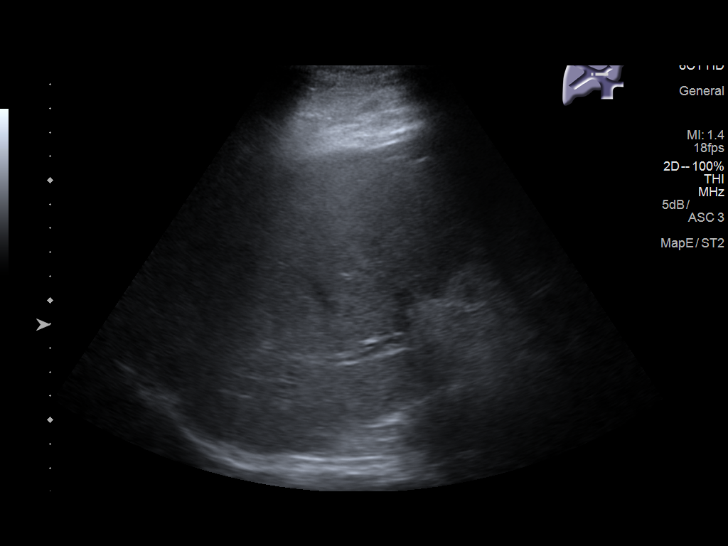
[im 20/30]
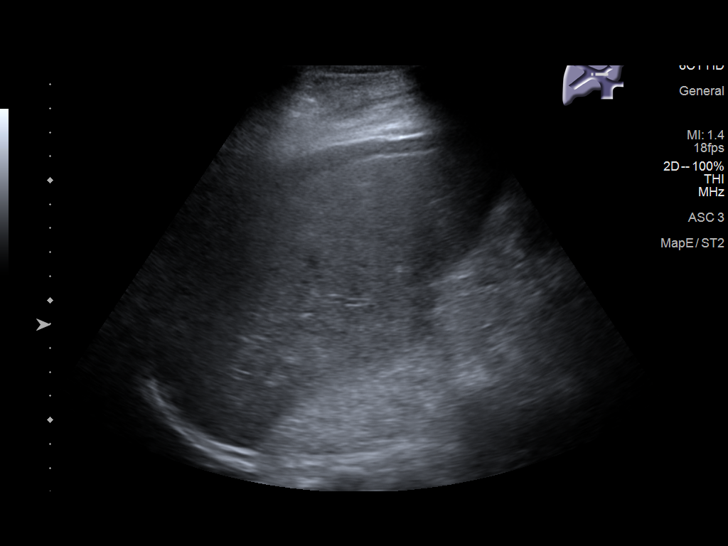
[im 22/30]
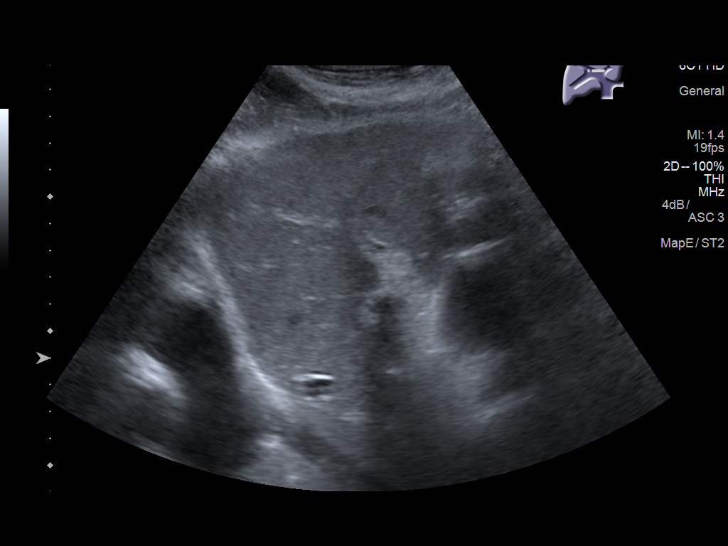
[im 25/30]
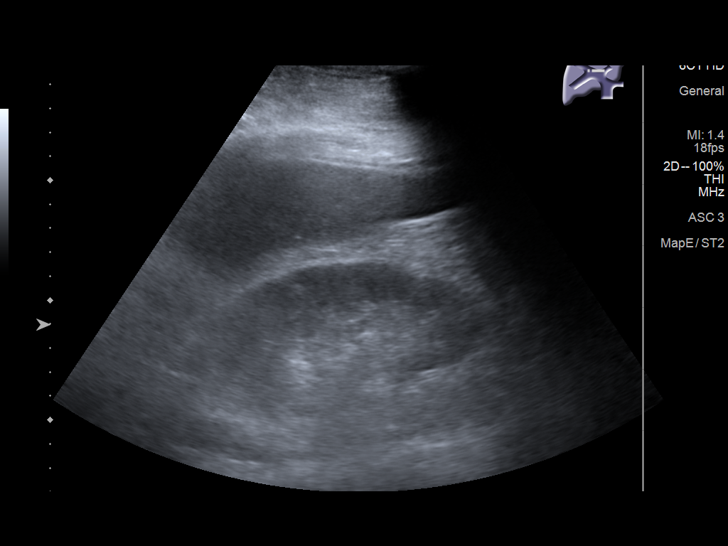
[im 27/30]
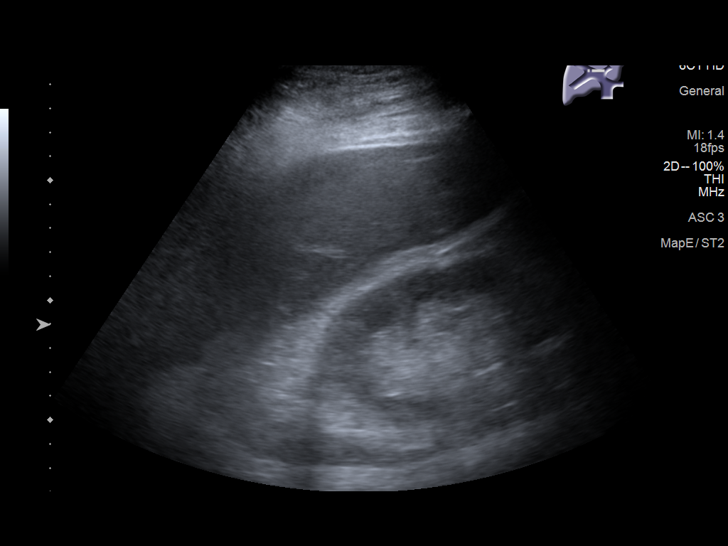
[im 30/30]
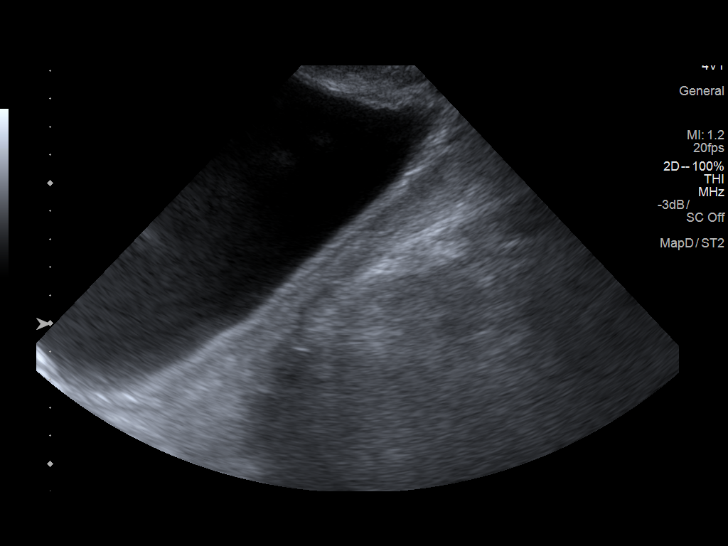

[14 of 25 positions shown; findings below may reference images not displayed]

FINDINGS: Gallbladder:

Cholecystostomy tube in place. Gallbladder sludge. Gallbladder wall
thickening measuring up to 5.1 mm. Ultrasonographer was not able to
determine if the patient was tender over this region secondary to
bandage.

Common bile duct:

Diameter: 5.4 mm.

Liver:

No hepatic lesion or intrahepatic biliary duct dilation. Portal vein
is patent on color Doppler imaging with normal direction of blood
flow towards the liver.

Ascites.  Right-sided pleural effusion.
IMPRESSION: Cholecystostomy tube in place. Gallbladder sludge. Gallbladder wall
thickening measuring up to 5.1 mm. Ultrasonographer was not able to
determine if the patient was tender over this region secondary to
bandage.

No hepatic lesion or intrahepatic biliary duct dilation.

Ascites.

Right-sided pleural effusion.

## 2018-08-05 IMAGING — CR DG HIP (WITH OR WITHOUT PELVIS) 1V PORT*L*
4 series · 4 of 4 positions shown · non-contrast
Comparison: Fluoroscopy 12/08/2016

CLINICAL DATA: Left hip fracture

EXAM:
DG HIP (WITH OR WITHOUT PELVIS) 1V PORT LEFT

[AP (1 of 3)]
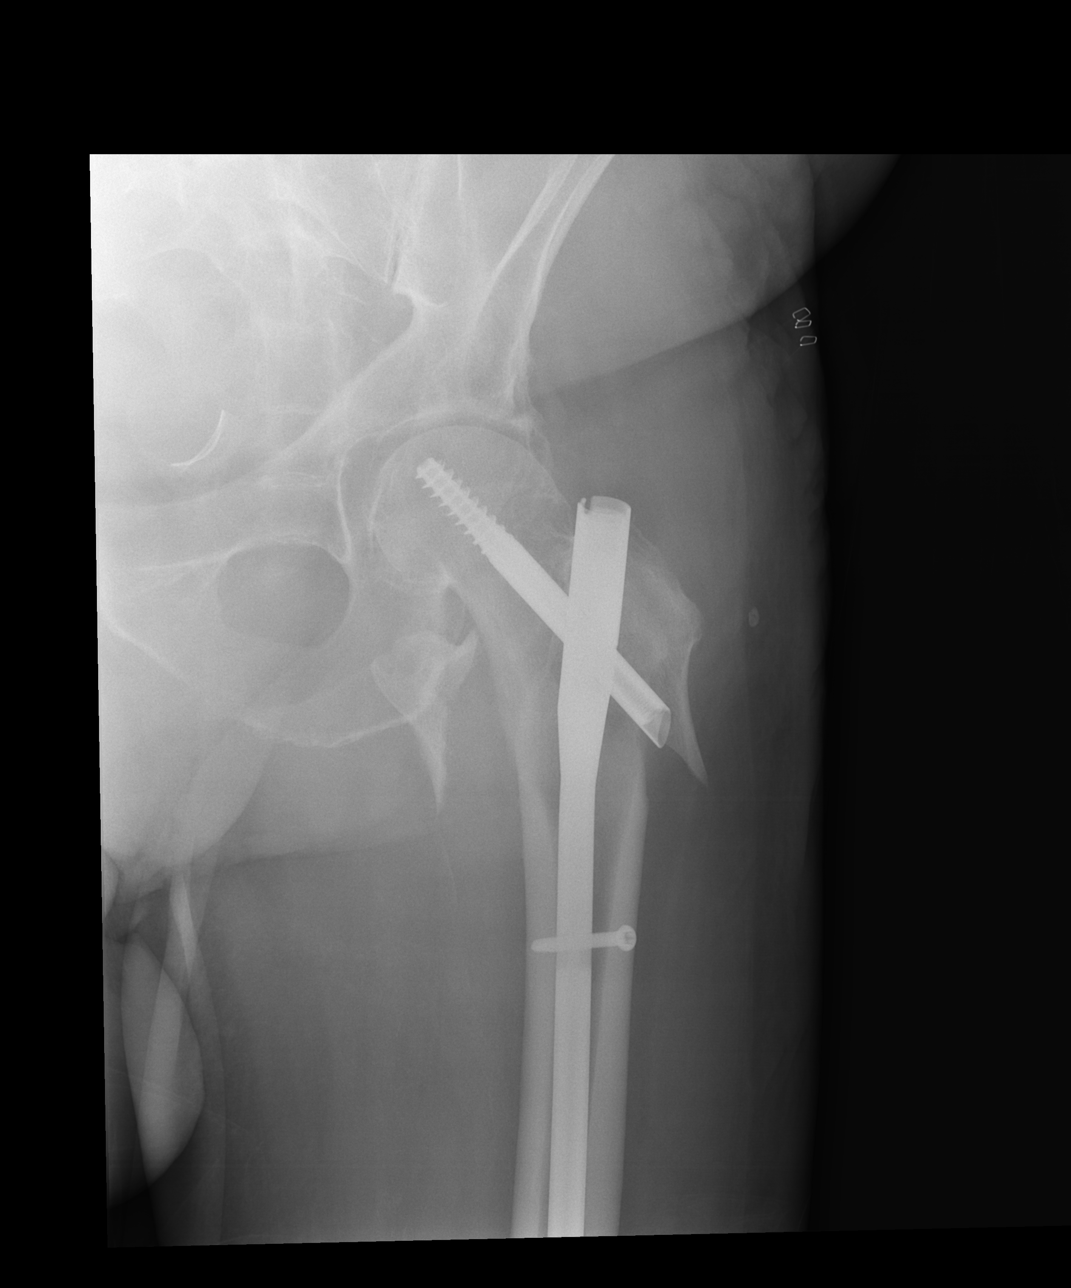

[lateral]
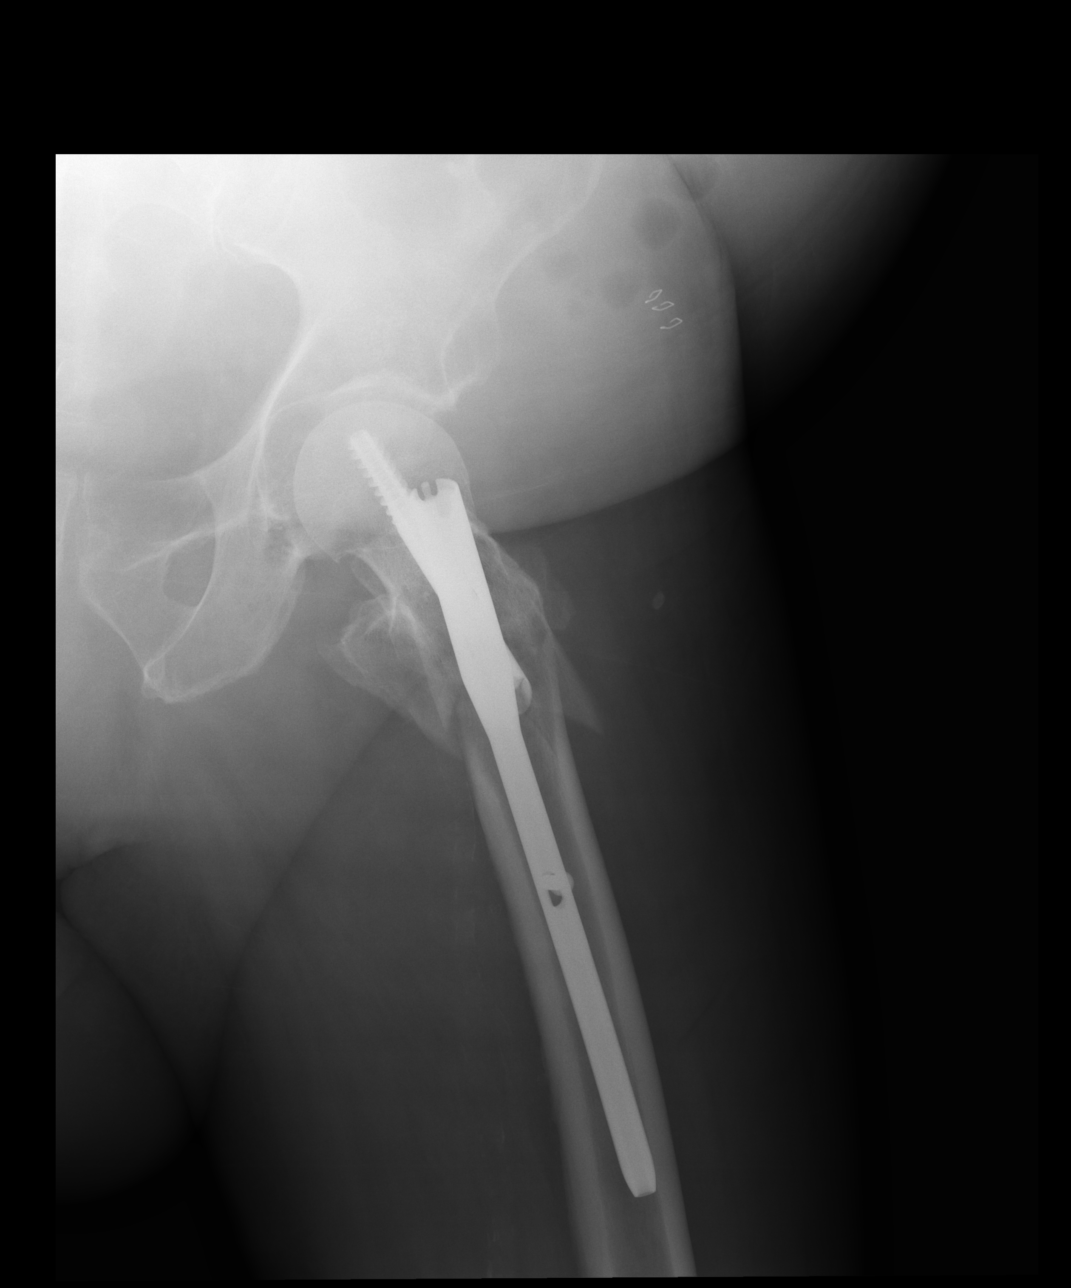

[AP (2 of 3)]
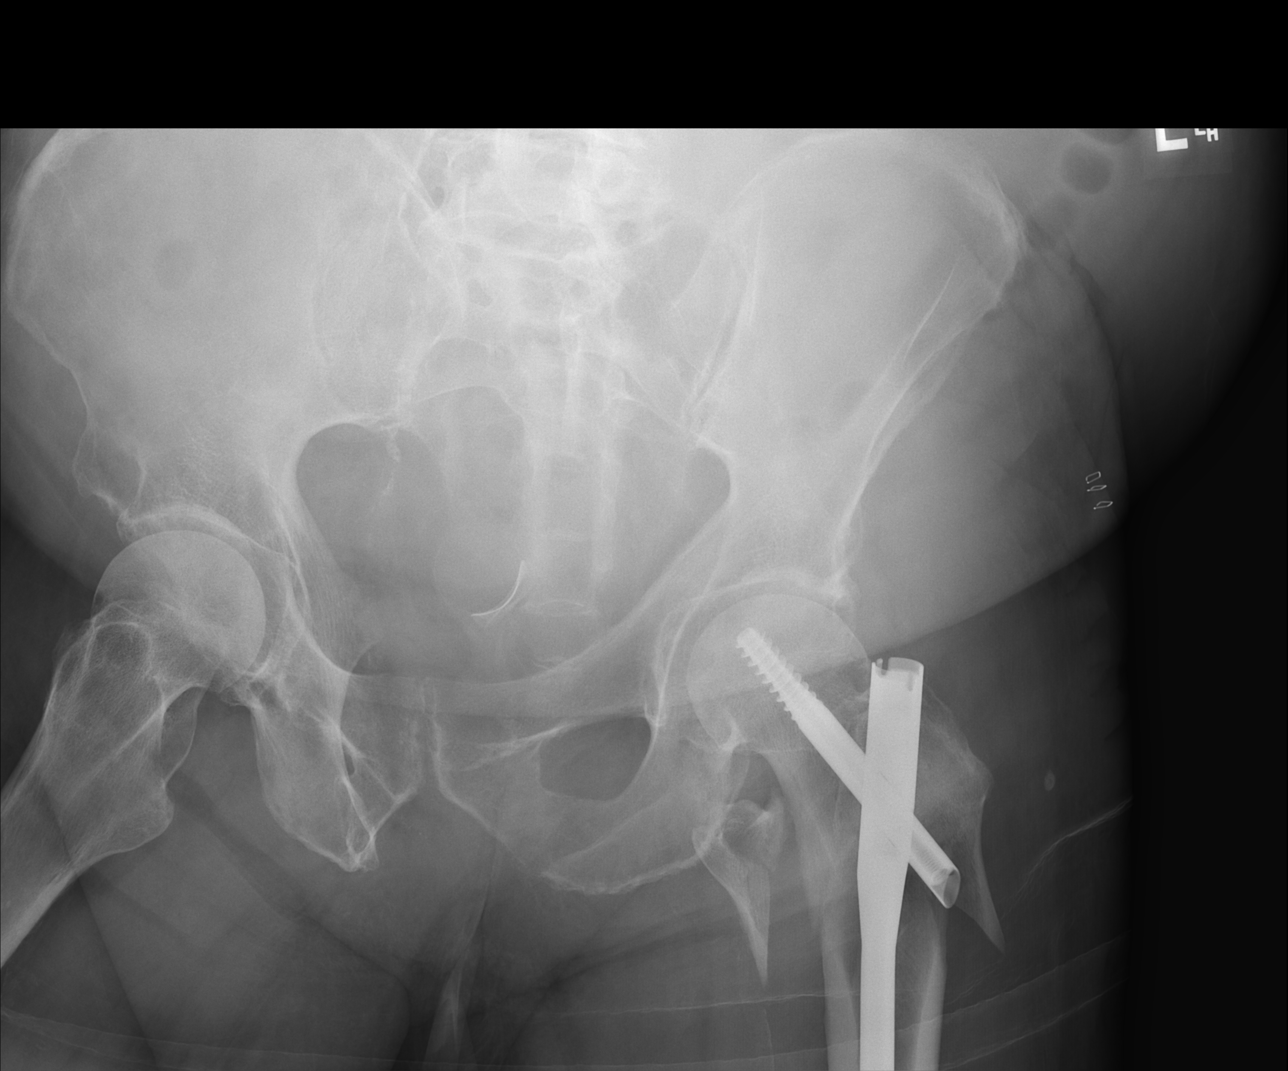

[AP (3 of 3)]
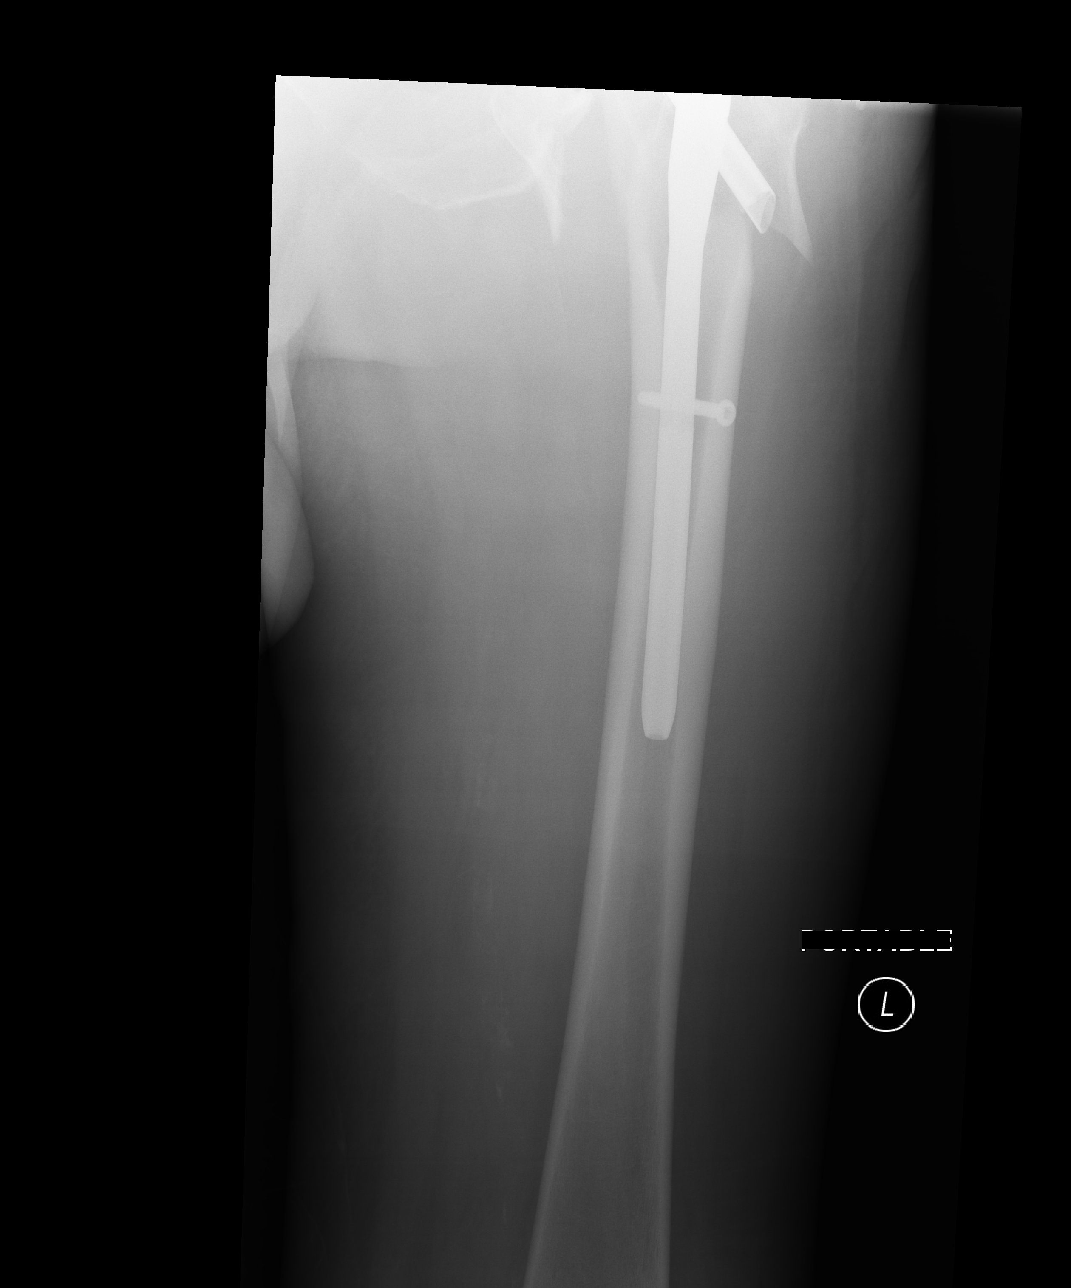

[4 of 4 positions shown; findings below may reference images not displayed]

FINDINGS: Status post ORIF of an intertrochanteric left femur fracture. A
lesser trochanteric fracture fragment appears more distracted.
Opaque structure over the pelvis is likely a rectal tube. Distorted
pelvic ring due to rotation. No evidence of fracture.
IMPRESSION: Recent left intertrochanteric femur fracture and ORIF. Greater
distraction of the lesser trochanteric fragment compared to
fluoroscopy 12/08/2016. The hardware is in stable position.
